# Patient Record
Sex: Male | Born: 1980 | Race: White | Hispanic: No | Marital: Single | Smoking: Current some day smoker
Health system: Southern US, Community
[De-identification: ages and names within clinical notes are randomized; demographics above are authoritative.]

## PROBLEM LIST (undated history)

## (undated) DIAGNOSIS — G061 Intraspinal abscess and granuloma: Secondary | ICD-10-CM

## (undated) DIAGNOSIS — B1920 Unspecified viral hepatitis C without hepatic coma: Secondary | ICD-10-CM

## (undated) DIAGNOSIS — K7010 Alcoholic hepatitis without ascites: Secondary | ICD-10-CM

## (undated) DIAGNOSIS — L0390 Cellulitis, unspecified: Secondary | ICD-10-CM

## (undated) DIAGNOSIS — M4620 Osteomyelitis of vertebra, site unspecified: Secondary | ICD-10-CM

## (undated) DIAGNOSIS — K7030 Alcoholic cirrhosis of liver without ascites: Secondary | ICD-10-CM

## (undated) DIAGNOSIS — F1010 Alcohol abuse, uncomplicated: Secondary | ICD-10-CM

## (undated) DIAGNOSIS — K852 Alcohol induced acute pancreatitis without necrosis or infection: Secondary | ICD-10-CM

## (undated) DIAGNOSIS — D696 Thrombocytopenia, unspecified: Secondary | ICD-10-CM

## (undated) HISTORY — PX: HERNIA REPAIR: SHX51

---

## 2011-03-18 ENCOUNTER — Emergency Department (HOSPITAL_COMMUNITY)
Admission: EM | Admit: 2011-03-18 | Discharge: 2011-03-18 | Disposition: A | Discharge status: Home / Self Care | Payer: Self-pay | Attending: Emergency Medicine | Admitting: Emergency Medicine

## 2011-03-18 DIAGNOSIS — L259 Unspecified contact dermatitis, unspecified cause: Secondary | ICD-10-CM | POA: Insufficient documentation

## 2013-09-28 DIAGNOSIS — B1920 Unspecified viral hepatitis C without hepatic coma: Secondary | ICD-10-CM

## 2013-09-28 HISTORY — DX: Unspecified viral hepatitis C without hepatic coma: B19.20

## 2014-01-26 DIAGNOSIS — K7030 Alcoholic cirrhosis of liver without ascites: Secondary | ICD-10-CM

## 2014-01-26 HISTORY — DX: Alcoholic cirrhosis of liver without ascites: K70.30

## 2014-05-29 HISTORY — PX: ESOPHAGOGASTRODUODENOSCOPY: SHX1529

## 2015-03-29 DIAGNOSIS — K852 Alcohol induced acute pancreatitis without necrosis or infection: Secondary | ICD-10-CM

## 2015-03-29 DIAGNOSIS — K7010 Alcoholic hepatitis without ascites: Secondary | ICD-10-CM

## 2015-03-29 HISTORY — DX: Alcohol induced acute pancreatitis without necrosis or infection: K85.20

## 2015-03-29 HISTORY — DX: Alcoholic hepatitis without ascites: K70.10

## 2016-10-16 ENCOUNTER — Emergency Department (HOSPITAL_COMMUNITY)
Admission: EM | Admit: 2016-10-16 | Discharge: 2016-10-16 | Disposition: A | Discharge status: Home / Self Care | Payer: BLUE CROSS/BLUE SHIELD | Attending: Emergency Medicine | Admitting: Emergency Medicine

## 2016-10-16 ENCOUNTER — Encounter (HOSPITAL_COMMUNITY): Payer: Self-pay | Admitting: *Deleted

## 2016-10-16 DIAGNOSIS — F17200 Nicotine dependence, unspecified, uncomplicated: Secondary | ICD-10-CM | POA: Insufficient documentation

## 2016-10-16 DIAGNOSIS — M5416 Radiculopathy, lumbar region: Secondary | ICD-10-CM | POA: Diagnosis not present

## 2016-10-16 DIAGNOSIS — M545 Low back pain: Secondary | ICD-10-CM | POA: Diagnosis present

## 2016-10-16 HISTORY — DX: Unspecified viral hepatitis C without hepatic coma: B19.20

## 2016-10-16 LAB — URINALYSIS, ROUTINE W REFLEX MICROSCOPIC
Bacteria, UA: NONE SEEN
Glucose, UA: NEGATIVE mg/dL
Hgb urine dipstick: NEGATIVE
Ketones, ur: 5 mg/dL — AB
Leukocytes, UA: NEGATIVE
NITRITE: NEGATIVE
PH: 7 (ref 5.0–8.0)
Protein, ur: 100 mg/dL — AB
SPECIFIC GRAVITY, URINE: 1.028 (ref 1.005–1.030)

## 2016-10-16 LAB — CBG MONITORING, ED: GLUCOSE-CAPILLARY: 90 mg/dL (ref 65–99)

## 2016-10-16 MED ORDER — CYCLOBENZAPRINE HCL 10 MG PO TABS
10.0000 mg | ORAL_TABLET | Freq: Once | ORAL | Status: AC
  Administered 2016-10-16: 10 mg via ORAL
  Filled 2016-10-16: qty 1

## 2016-10-16 MED ORDER — NAPROXEN 500 MG PO TABS: 500.0000 mg | ORAL_TABLET | Freq: Two times a day (BID) | ORAL | 0 refills | Status: DC

## 2016-10-16 MED ORDER — SODIUM CHLORIDE 0.9 % IV BOLUS (SEPSIS)
1000.0000 mL | Freq: Once | INTRAVENOUS | Status: AC
  Administered 2016-10-16: 1000 mL via INTRAVENOUS

## 2016-10-16 MED ORDER — PREDNISONE 20 MG PO TABS: 40.0000 mg | ORAL_TABLET | Freq: Every day | ORAL | 0 refills | Status: DC

## 2016-10-16 MED ORDER — CYCLOBENZAPRINE HCL 10 MG PO TABS: 10.0000 mg | ORAL_TABLET | Freq: Two times a day (BID) | ORAL | 0 refills | Status: DC | PRN

## 2016-10-16 MED ORDER — KETOROLAC TROMETHAMINE 30 MG/ML IJ SOLN
30.0000 mg | Freq: Once | INTRAMUSCULAR | Status: AC
  Administered 2016-10-16: 30 mg via INTRAVENOUS
  Filled 2016-10-16: qty 1

## 2016-10-16 MED ORDER — ONDANSETRON HCL 4 MG/2ML IJ SOLN
4.0000 mg | Freq: Once | INTRAMUSCULAR | Status: AC
  Administered 2016-10-16: 4 mg via INTRAVENOUS
  Filled 2016-10-16: qty 2

## 2016-10-16 MED ORDER — DICLOFENAC SODIUM 1 % TD GEL: 4.0000 g | Freq: Four times a day (QID) | TRANSDERMAL | 1 refills | Status: DC

## 2016-10-16 MED ORDER — HYDROCODONE-ACETAMINOPHEN 5-325 MG PO TABS: 2.0000 | ORAL_TABLET | ORAL | 0 refills | Status: DC | PRN

## 2016-10-16 MED ORDER — HYDROMORPHONE HCL 1 MG/ML IJ SOLN
0.5000 mg | Freq: Once | INTRAMUSCULAR | Status: AC
Start: 2016-10-16 — End: 2016-10-16
  Administered 2016-10-16: 0.5 mg via INTRAVENOUS
  Filled 2016-10-16: qty 1

## 2016-10-16 NOTE — ED Triage Notes (Signed)
Pt developed Rt lower back pain, at times unable to move during the night, pain shoots down rt leg. Unable to sleep

## 2016-10-16 NOTE — ED Provider Notes (Signed)
[facility] DEPT Provider Note   CSN: [id number] Arrival date & time: 10/16/16  1018     History   Chief Complaint Chief Complaint  Patient presents with  . Back Pain    HPI [name] is a 36 y.o. male.  The history is provided by the patient and the spouse.  Back Pain   This is a new problem. The current episode started yesterday. The problem occurs constantly. The problem has been rapidly worsening. The pain is associated with no known injury. The pain is present in the lumbar spine. The quality of the pain is described as shooting, stabbing and cramping. The pain radiates to the right thigh. The pain is at a severity of 10/10. The pain is severe. The symptoms are aggravated by bending and twisting (walking). The pain is the same all the time. Associated symptoms include leg pain. Pertinent negatives include no chest pain, no fever, no numbness, no abdominal pain, no abdominal swelling, no bowel incontinence, no perianal numbness, no bladder incontinence, no dysuria, no paresthesias, no paresis and no weakness. He has tried NSAIDs for the symptoms. The treatment provided no relief.    Past Medical History:  Diagnosis Date  . Hepatitis C     There are no active problems to display for this patient.   Past Surgical History:  Procedure Laterality Date  . HERNIA REPAIR         Home Medications    Prior to Admission medications   Medication Sig Start Date End Date Taking? Authorizing Provider  promethazine (PHENERGAN) 25 MG tablet Take 25 mg by mouth every 6 (six) hours as needed for nausea or vomiting.   Yes Historical Provider, MD    Family History No family history on file.  Social History Social History  Substance Use Topics  . Smoking status: Current Some Day Smoker  . Smokeless tobacco: Never Used  . Alcohol use Yes     Allergies   Patient has no known allergies.   Review of Systems Review of Systems  Constitutional: Negative for fever.    Cardiovascular: Negative for chest pain.  Gastrointestinal: Negative for abdominal pain and bowel incontinence.  Genitourinary: Negative for bladder incontinence and dysuria.  Musculoskeletal: Positive for back pain.  Neurological: Negative for weakness, numbness and paresthesias.  All other systems reviewed and are negative.    Physical Exam Updated Vital Signs BP 132/84   Pulse 84   Temp 98.3 F (36.8 C) (Oral)   Resp 16   Ht 5\' 11"  (1.803 m)   Wt 230 lb (104.3 kg)   SpO2 100%   BMI 32.08 kg/m   Physical Exam  Constitutional: He is oriented to person, place, and time. He appears well-developed and well-nourished. He appears distressed.  Appears uncomfortable  HENT:  Head: Normocephalic and atraumatic.  Mouth/Throat: Oropharynx is clear and moist.  Eyes: Conjunctivae and EOM are normal. Pupils are equal, round, and reactive to light.  Neck: Normal range of motion. Neck supple.  Cardiovascular: Normal rate, regular rhythm and intact distal pulses.   No murmur heard. Pulmonary/Chest: Effort normal and breath sounds normal. No respiratory distress. He has no wheezes. He has no rales.  Abdominal: Soft. He exhibits no distension. There is no tenderness. There is no rebound and no guarding.  Musculoskeletal: Normal range of motion. He exhibits no edema.       Lumbar back: He exhibits tenderness, pain and spasm. He exhibits no bony tenderness.       Back:  Neurological: He is alert and oriented to person, place, and time. He has normal strength. No sensory deficit.  5/5 strength in right lower ext.  Sensation intact  Skin: Skin is warm. No rash noted. He is diaphoretic. No erythema.  Psychiatric: He has a normal mood and affect. His behavior is normal.  Nursing note and vitals reviewed.    ED Treatments / Results  Labs (all labs ordered are listed, but only abnormal results are displayed) Labs Reviewed  URINALYSIS, ROUTINE W REFLEX MICROSCOPIC - Abnormal; Notable for the  following:       Result Value   Color, Urine AMBER (*)    Bilirubin Urine SMALL (*)    Ketones, ur 5 (*)    Protein, ur 100 (*)    Squamous Epithelial / LPF 0-5 (*)    All other components within normal limits  CBG MONITORING, ED    EKG  EKG Interpretation None       Radiology No results found.  Procedures Procedures (including critical care time)  Medications Ordered in ED Medications  ketorolac (TORADOL) 30 MG/ML injection 30 mg (not administered)  ondansetron (ZOFRAN) injection 4 mg (not administered)  sodium chloride 0.9 % bolus 1,000 mL (not administered)  HYDROmorphone (DILAUDID) injection 0.5 mg (not administered)  cyclobenzaprine (FLEXERIL) tablet 10 mg (not administered)     Initial Impression / Assessment and Plan / ED Course  I have reviewed the triage vital signs and the nursing notes.  Pertinent labs & imaging results that were available during my care of the patient were reviewed by me and considered in my medical decision making (see chart for details).    Pt with gradual onset of back pain starting yesterday significantly worsening overnight and now doing down the right leg suggestive of radiculopathy.  No neurovascular compromise and no incontinence.  Pt has no infectious sx, hx of CA  or other red flags concerning for pathologic back pain.  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 Family is present and bowel just for this. He has no track marks on his upper extremities or legs.  Normal strength and reflexes on exam.  Denies trauma.  He does not have significant CVA tenderness and no abdominal tenderness concerning for dissection, kidney stone, appendicitis. Patient does have some severe spasm and pain anytime he tries to move. Will give pt pain control.  6:53 PM Patient feeling much better. UA without blood and low suspicion for kidney stone at this  time. Patient discharged home.  Final Clinical Impressions(s) / ED Diagnoses   Final diagnoses:  Lumbar radiculopathy, acute    New Prescriptions New Prescriptions   CYCLOBENZAPRINE (FLEXERIL) 10 MG TABLET    Take 1 tablet (10 mg total) by mouth 2 (two) times daily as needed for muscle spasms.   DICLOFENAC SODIUM (VOLTAREN) 1 % GEL    Apply 4 g topically 4 (four) times daily.   HYDROCODONE-ACETAMINOPHEN (NORCO/VICODIN) 5-325 MG TABLET    Take 2 tablets by mouth every 4 (four) hours as needed.   NAPROXEN (NAPROSYN) 500 MG TABLET    Take 1 tablet (500 mg total) by mouth 2 (two) times daily.   PREDNISONE (DELTASONE) 20 MG TABLET    Take 2 tablets (40 mg total) by mouth daily.     Blanchie Dessert, MD 10/16/16 [phone]

## 2016-10-26 ENCOUNTER — Ambulatory Visit (HOSPITAL_COMMUNITY)
Admission: RE | Admit: 2016-10-26 | Discharge: 2016-10-26 | Disposition: A | Discharge status: Home / Self Care | Payer: BLUE CROSS/BLUE SHIELD | Source: Ambulatory Visit | Attending: Cardiovascular Disease | Admitting: Cardiovascular Disease

## 2016-10-26 ENCOUNTER — Other Ambulatory Visit (HOSPITAL_COMMUNITY): Payer: Self-pay | Admitting: Physical Medicine and Rehabilitation

## 2016-10-26 DIAGNOSIS — R600 Localized edema: Secondary | ICD-10-CM

## 2016-10-26 DIAGNOSIS — M7120 Synovial cyst of popliteal space [Baker], unspecified knee: Secondary | ICD-10-CM | POA: Insufficient documentation

## 2016-10-29 ENCOUNTER — Encounter (HOSPITAL_COMMUNITY): Payer: Self-pay | Admitting: Anesthesiology

## 2016-10-29 ENCOUNTER — Inpatient Hospital Stay (HOSPITAL_COMMUNITY)
Admission: EM | Admit: 2016-10-29 | Discharge: 2016-11-16 | DRG: 853 | Disposition: A | Discharge status: Rehabilitation Facility | Payer: BLUE CROSS/BLUE SHIELD | Attending: Internal Medicine | Admitting: Internal Medicine

## 2016-10-29 ENCOUNTER — Encounter (HOSPITAL_COMMUNITY)
Admission: EM | Disposition: A | Discharge status: Rehabilitation Facility | Payer: Self-pay | Source: Home / Self Care | Attending: Internal Medicine

## 2016-10-29 ENCOUNTER — Inpatient Hospital Stay (HOSPITAL_COMMUNITY): Payer: BLUE CROSS/BLUE SHIELD

## 2016-10-29 ENCOUNTER — Encounter (HOSPITAL_COMMUNITY): Payer: Self-pay | Admitting: Emergency Medicine

## 2016-10-29 DIAGNOSIS — M4620 Osteomyelitis of vertebra, site unspecified: Secondary | ICD-10-CM

## 2016-10-29 DIAGNOSIS — M869 Osteomyelitis, unspecified: Secondary | ICD-10-CM | POA: Diagnosis not present

## 2016-10-29 DIAGNOSIS — B182 Chronic viral hepatitis C: Secondary | ICD-10-CM

## 2016-10-29 DIAGNOSIS — K759 Inflammatory liver disease, unspecified: Secondary | ICD-10-CM

## 2016-10-29 DIAGNOSIS — M79604 Pain in right leg: Secondary | ICD-10-CM | POA: Diagnosis present

## 2016-10-29 DIAGNOSIS — M4656 Other infective spondylopathies, lumbar region: Secondary | ICD-10-CM | POA: Diagnosis not present

## 2016-10-29 DIAGNOSIS — F17200 Nicotine dependence, unspecified, uncomplicated: Secondary | ICD-10-CM | POA: Diagnosis present

## 2016-10-29 DIAGNOSIS — D6959 Other secondary thrombocytopenia: Secondary | ICD-10-CM | POA: Diagnosis present

## 2016-10-29 DIAGNOSIS — Z452 Encounter for adjustment and management of vascular access device: Secondary | ICD-10-CM

## 2016-10-29 DIAGNOSIS — F10931 Alcohol use, unspecified with withdrawal delirium: Secondary | ICD-10-CM

## 2016-10-29 DIAGNOSIS — M79A21 Nontraumatic compartment syndrome of right lower extremity: Secondary | ICD-10-CM | POA: Diagnosis present

## 2016-10-29 DIAGNOSIS — M009 Pyogenic arthritis, unspecified: Secondary | ICD-10-CM

## 2016-10-29 DIAGNOSIS — K7030 Alcoholic cirrhosis of liver without ascites: Secondary | ICD-10-CM

## 2016-10-29 DIAGNOSIS — L02415 Cutaneous abscess of right lower limb: Secondary | ICD-10-CM | POA: Diagnosis present

## 2016-10-29 DIAGNOSIS — M4646 Discitis, unspecified, lumbar region: Secondary | ICD-10-CM | POA: Diagnosis present

## 2016-10-29 DIAGNOSIS — B9689 Other specified bacterial agents as the cause of diseases classified elsewhere: Secondary | ICD-10-CM | POA: Diagnosis not present

## 2016-10-29 DIAGNOSIS — L0390 Cellulitis, unspecified: Secondary | ICD-10-CM

## 2016-10-29 DIAGNOSIS — R748 Abnormal levels of other serum enzymes: Secondary | ICD-10-CM

## 2016-10-29 DIAGNOSIS — Z833 Family history of diabetes mellitus: Secondary | ICD-10-CM

## 2016-10-29 DIAGNOSIS — Z6837 Body mass index (BMI) 37.0-37.9, adult: Secondary | ICD-10-CM

## 2016-10-29 DIAGNOSIS — Z9911 Dependence on respirator [ventilator] status: Secondary | ICD-10-CM | POA: Diagnosis not present

## 2016-10-29 DIAGNOSIS — A419 Sepsis, unspecified organism: Secondary | ICD-10-CM | POA: Insufficient documentation

## 2016-10-29 DIAGNOSIS — E876 Hypokalemia: Secondary | ICD-10-CM | POA: Diagnosis present

## 2016-10-29 DIAGNOSIS — Z9889 Other specified postprocedural states: Secondary | ICD-10-CM | POA: Diagnosis not present

## 2016-10-29 DIAGNOSIS — Z8781 Personal history of (healed) traumatic fracture: Secondary | ICD-10-CM | POA: Diagnosis not present

## 2016-10-29 DIAGNOSIS — M4626 Osteomyelitis of vertebra, lumbar region: Secondary | ICD-10-CM | POA: Diagnosis present

## 2016-10-29 DIAGNOSIS — A498 Other bacterial infections of unspecified site: Secondary | ICD-10-CM | POA: Diagnosis not present

## 2016-10-29 DIAGNOSIS — T79A9XD Traumatic compartment syndrome of other sites, subsequent encounter: Secondary | ICD-10-CM | POA: Diagnosis not present

## 2016-10-29 DIAGNOSIS — J9601 Acute respiratory failure with hypoxia: Secondary | ICD-10-CM | POA: Diagnosis present

## 2016-10-29 DIAGNOSIS — G062 Extradural and subdural abscess, unspecified: Secondary | ICD-10-CM | POA: Diagnosis not present

## 2016-10-29 DIAGNOSIS — E871 Hypo-osmolality and hyponatremia: Secondary | ICD-10-CM | POA: Diagnosis not present

## 2016-10-29 DIAGNOSIS — F10231 Alcohol dependence with withdrawal delirium: Secondary | ICD-10-CM | POA: Diagnosis present

## 2016-10-29 DIAGNOSIS — K7460 Unspecified cirrhosis of liver: Secondary | ICD-10-CM

## 2016-10-29 DIAGNOSIS — K7010 Alcoholic hepatitis without ascites: Secondary | ICD-10-CM | POA: Diagnosis present

## 2016-10-29 DIAGNOSIS — M01X9 Direct infection of multiple joints in infectious and parasitic diseases classified elsewhere: Secondary | ICD-10-CM | POA: Diagnosis not present

## 2016-10-29 DIAGNOSIS — D696 Thrombocytopenia, unspecified: Secondary | ICD-10-CM | POA: Diagnosis not present

## 2016-10-29 DIAGNOSIS — M60051 Infective myositis, right thigh: Secondary | ICD-10-CM

## 2016-10-29 DIAGNOSIS — B954 Other streptococcus as the cause of diseases classified elsewhere: Secondary | ICD-10-CM | POA: Diagnosis not present

## 2016-10-29 DIAGNOSIS — R7401 Elevation of levels of liver transaminase levels: Secondary | ICD-10-CM

## 2016-10-29 DIAGNOSIS — K7290 Hepatic failure, unspecified without coma: Secondary | ICD-10-CM | POA: Diagnosis present

## 2016-10-29 DIAGNOSIS — F1010 Alcohol abuse, uncomplicated: Secondary | ICD-10-CM

## 2016-10-29 DIAGNOSIS — E870 Hyperosmolality and hypernatremia: Secondary | ICD-10-CM

## 2016-10-29 DIAGNOSIS — K7040 Alcoholic hepatic failure without coma: Secondary | ICD-10-CM

## 2016-10-29 DIAGNOSIS — G061 Intraspinal abscess and granuloma: Secondary | ICD-10-CM

## 2016-10-29 DIAGNOSIS — Z7189 Other specified counseling: Secondary | ICD-10-CM | POA: Diagnosis not present

## 2016-10-29 DIAGNOSIS — L03115 Cellulitis of right lower limb: Secondary | ICD-10-CM | POA: Diagnosis present

## 2016-10-29 DIAGNOSIS — F10239 Alcohol dependence with withdrawal, unspecified: Secondary | ICD-10-CM

## 2016-10-29 DIAGNOSIS — R740 Nonspecific elevation of levels of transaminase and lactic acid dehydrogenase [LDH]: Secondary | ICD-10-CM | POA: Diagnosis present

## 2016-10-29 DIAGNOSIS — T79A9XA Traumatic compartment syndrome of other sites, initial encounter: Secondary | ICD-10-CM | POA: Diagnosis not present

## 2016-10-29 DIAGNOSIS — F10939 Alcohol use, unspecified with withdrawal, unspecified: Secondary | ICD-10-CM

## 2016-10-29 DIAGNOSIS — F1910 Other psychoactive substance abuse, uncomplicated: Secondary | ICD-10-CM | POA: Diagnosis present

## 2016-10-29 DIAGNOSIS — F1020 Alcohol dependence, uncomplicated: Secondary | ICD-10-CM | POA: Diagnosis present

## 2016-10-29 DIAGNOSIS — Z4659 Encounter for fitting and adjustment of other gastrointestinal appliance and device: Secondary | ICD-10-CM

## 2016-10-29 DIAGNOSIS — R509 Fever, unspecified: Secondary | ICD-10-CM

## 2016-10-29 DIAGNOSIS — K760 Fatty (change of) liver, not elsewhere classified: Secondary | ICD-10-CM | POA: Diagnosis present

## 2016-10-29 DIAGNOSIS — Z781 Physical restraint status: Secondary | ICD-10-CM

## 2016-10-29 DIAGNOSIS — K7200 Acute and subacute hepatic failure without coma: Secondary | ICD-10-CM | POA: Diagnosis not present

## 2016-10-29 DIAGNOSIS — M60009 Infective myositis, unspecified site: Secondary | ICD-10-CM | POA: Diagnosis present

## 2016-10-29 DIAGNOSIS — M7120 Synovial cyst of popliteal space [Baker], unspecified knee: Secondary | ICD-10-CM | POA: Diagnosis present

## 2016-10-29 DIAGNOSIS — G9340 Encephalopathy, unspecified: Secondary | ICD-10-CM | POA: Diagnosis not present

## 2016-10-29 DIAGNOSIS — M7989 Other specified soft tissue disorders: Secondary | ICD-10-CM | POA: Diagnosis not present

## 2016-10-29 DIAGNOSIS — Z09 Encounter for follow-up examination after completed treatment for conditions other than malignant neoplasm: Secondary | ICD-10-CM

## 2016-10-29 DIAGNOSIS — R162 Hepatomegaly with splenomegaly, not elsewhere classified: Secondary | ICD-10-CM | POA: Diagnosis present

## 2016-10-29 DIAGNOSIS — G8311 Monoplegia of lower limb affecting right dominant side: Secondary | ICD-10-CM | POA: Diagnosis not present

## 2016-10-29 DIAGNOSIS — T79A29A Traumatic compartment syndrome of unspecified lower extremity, initial encounter: Secondary | ICD-10-CM | POA: Diagnosis not present

## 2016-10-29 DIAGNOSIS — D649 Anemia, unspecified: Secondary | ICD-10-CM | POA: Diagnosis present

## 2016-10-29 DIAGNOSIS — E162 Hypoglycemia, unspecified: Secondary | ICD-10-CM | POA: Diagnosis present

## 2016-10-29 DIAGNOSIS — G8918 Other acute postprocedural pain: Secondary | ICD-10-CM

## 2016-10-29 DIAGNOSIS — Z515 Encounter for palliative care: Secondary | ICD-10-CM | POA: Diagnosis not present

## 2016-10-29 DIAGNOSIS — Z978 Presence of other specified devices: Secondary | ICD-10-CM

## 2016-10-29 DIAGNOSIS — E669 Obesity, unspecified: Secondary | ICD-10-CM | POA: Diagnosis present

## 2016-10-29 DIAGNOSIS — R5381 Other malaise: Secondary | ICD-10-CM | POA: Diagnosis not present

## 2016-10-29 DIAGNOSIS — D689 Coagulation defect, unspecified: Secondary | ICD-10-CM

## 2016-10-29 DIAGNOSIS — B999 Unspecified infectious disease: Secondary | ICD-10-CM | POA: Diagnosis present

## 2016-10-29 DIAGNOSIS — R269 Unspecified abnormalities of gait and mobility: Secondary | ICD-10-CM

## 2016-10-29 DIAGNOSIS — R451 Restlessness and agitation: Secondary | ICD-10-CM | POA: Diagnosis not present

## 2016-10-29 DIAGNOSIS — M7121 Synovial cyst of popliteal space [Baker], right knee: Secondary | ICD-10-CM | POA: Diagnosis not present

## 2016-10-29 DIAGNOSIS — I339 Acute and subacute endocarditis, unspecified: Secondary | ICD-10-CM | POA: Diagnosis not present

## 2016-10-29 DIAGNOSIS — Z01818 Encounter for other preprocedural examination: Secondary | ICD-10-CM

## 2016-10-29 HISTORY — DX: Intraspinal abscess and granuloma: G06.1

## 2016-10-29 HISTORY — DX: Cellulitis, unspecified: L03.90

## 2016-10-29 LAB — BASIC METABOLIC PANEL
ANION GAP: 11 (ref 5–15)
BUN: 17 mg/dL (ref 6–20)
CALCIUM: 7.7 mg/dL — AB (ref 8.9–10.3)
CO2: 21 mmol/L — AB (ref 22–32)
Chloride: 100 mmol/L — ABNORMAL LOW (ref 101–111)
Creatinine, Ser: 0.67 mg/dL (ref 0.61–1.24)
GFR calc non Af Amer: >60 mL/min (ref >60)
GLUCOSE: 168 mg/dL — AB (ref 65–99)
POTASSIUM: 3.7 mmol/L (ref 3.5–5.1)
Sodium: 132 mmol/L — ABNORMAL LOW (ref 135–145)

## 2016-10-29 LAB — CBC WITH DIFFERENTIAL/PLATELET
BASOS ABS: 0.2 10*3/uL — AB (ref 0.0–0.1)
Basophils Relative: 1 %
EOS PCT: 0 %
Eosinophils Absolute: 0 10*3/uL (ref 0.0–0.7)
HEMATOCRIT: 32.2 % — AB (ref 39.0–52.0)
HEMOGLOBIN: 11.4 g/dL — AB (ref 13.0–17.0)
LYMPHS PCT: 10 %
Lymphs Abs: 1.9 10*3/uL (ref 0.7–4.0)
MCH: 33.3 pg (ref 26.0–34.0)
MCHC: 35.4 g/dL (ref 30.0–36.0)
MCV: 94.2 fL (ref 78.0–100.0)
MONOS PCT: 11 %
Monocytes Absolute: 2.1 10*3/uL — ABNORMAL HIGH (ref 0.1–1.0)
NEUTROS PCT: 78 %
Neutro Abs: 15.1 10*3/uL — ABNORMAL HIGH (ref 1.7–7.7)
Platelets: 34 10*3/uL — ABNORMAL LOW (ref 150–400)
RBC: 3.42 MIL/uL — AB (ref 4.22–5.81)
RDW: 16.4 % — AB (ref 11.5–15.5)
WBC: 19.3 10*3/uL — AB (ref 4.0–10.5)

## 2016-10-29 LAB — C-REACTIVE PROTEIN: CRP: 17.3 mg/dL — AB (ref <1.0)

## 2016-10-29 LAB — SEDIMENTATION RATE: Sed Rate: 70 mm/hr — ABNORMAL HIGH (ref 0–16)

## 2016-10-29 LAB — HEPATIC FUNCTION PANEL
ALT: 64 U/L — AB (ref 17–63)
AST: 190 U/L — AB (ref 15–41)
Albumin: 2.1 g/dL — ABNORMAL LOW (ref 3.5–5.0)
Alkaline Phosphatase: 222 U/L — ABNORMAL HIGH (ref 38–126)
BILIRUBIN DIRECT: 8.7 mg/dL — AB (ref 0.1–0.5)
BILIRUBIN INDIRECT: 5.3 mg/dL — AB (ref 0.3–0.9)
TOTAL PROTEIN: 6.3 g/dL — AB (ref 6.5–8.1)
Total Bilirubin: 14 mg/dL — ABNORMAL HIGH (ref 0.3–1.2)

## 2016-10-29 LAB — I-STAT CG4 LACTIC ACID, ED: LACTIC ACID, VENOUS: 1.98 mmol/L — AB (ref 0.5–1.9)

## 2016-10-29 LAB — APTT: aPTT: 38 seconds — ABNORMAL HIGH (ref 24–36)

## 2016-10-29 LAB — LACTIC ACID, PLASMA: Lactic Acid, Venous: 1.6 mmol/L (ref 0.5–1.9)

## 2016-10-29 LAB — PROTIME-INR
INR: 1.96
PROTHROMBIN TIME: 22.6 s — AB (ref 11.4–15.2)

## 2016-10-29 SURGERY — IRRIGATION AND DEBRIDEMENT WOUND
Anesthesia: General

## 2016-10-29 MED ORDER — POLYETHYLENE GLYCOL 3350 17 G PO PACK
17.0000 g | PACK | Freq: Every day | ORAL | Status: DC | PRN
Start: 2016-10-29 — End: 2016-10-31
  Filled 2016-10-29: qty 1

## 2016-10-29 MED ORDER — HYDROMORPHONE HCL 1 MG/ML IJ SOLN
1.0000 mg | Freq: Once | INTRAMUSCULAR | Status: AC
  Administered 2016-10-29: 1 mg via INTRAVENOUS
  Filled 2016-10-29: qty 1

## 2016-10-29 MED ORDER — VANCOMYCIN HCL IN DEXTROSE 1-5 GM/200ML-% IV SOLN
1000.0000 mg | Freq: Three times a day (TID) | INTRAVENOUS | Status: DC
  Administered 2016-10-29 – 2016-11-01 (×8): 1000 mg via INTRAVENOUS
  Filled 2016-10-29 (×10): qty 200

## 2016-10-29 MED ORDER — BISACODYL 5 MG PO TBEC: 5.0000 mg | DELAYED_RELEASE_TABLET | Freq: Every day | ORAL | Status: DC | PRN

## 2016-10-29 MED ORDER — MORPHINE SULFATE (PF) 2 MG/ML IV SOLN
0.5000 mg | INTRAVENOUS | Status: DC | PRN
  Administered 2016-10-29 – 2016-10-31 (×7): 0.5 mg via INTRAVENOUS
  Filled 2016-10-29 (×7): qty 1

## 2016-10-29 MED ORDER — ONDANSETRON HCL 4 MG/2ML IJ SOLN
4.0000 mg | Freq: Four times a day (QID) | INTRAMUSCULAR | Status: DC | PRN
  Administered 2016-10-30 (×2): 4 mg via INTRAVENOUS
  Filled 2016-10-29 (×2): qty 2

## 2016-10-29 MED ORDER — THIAMINE HCL 100 MG/ML IJ SOLN
100.0000 mg | Freq: Every day | INTRAMUSCULAR | Status: DC
  Administered 2016-10-29: 100 mg via INTRAVENOUS
  Filled 2016-10-29: qty 2

## 2016-10-29 MED ORDER — ONDANSETRON HCL 4 MG PO TABS
4.0000 mg | ORAL_TABLET | Freq: Four times a day (QID) | ORAL | Status: DC | PRN
Start: 2016-10-29 — End: 2016-10-31
  Filled 2016-10-29: qty 1

## 2016-10-29 MED ORDER — LACTULOSE 10 GM/15ML PO SOLN
30.0000 g | Freq: Two times a day (BID) | ORAL | Status: DC
  Administered 2016-10-29: 30 g via ORAL
  Filled 2016-10-29 (×2): qty 45

## 2016-10-29 MED ORDER — DEXTROSE-NACL 5-0.9 % IV SOLN: INTRAVENOUS | Status: DC

## 2016-10-29 MED ORDER — VANCOMYCIN HCL IN DEXTROSE 1-5 GM/200ML-% IV SOLN
1000.0000 mg | Freq: Once | INTRAVENOUS | Status: AC
  Administered 2016-10-29: 1000 mg via INTRAVENOUS
  Filled 2016-10-29: qty 200

## 2016-10-29 MED ORDER — PIPERACILLIN-TAZOBACTAM 3.375 G IVPB 30 MIN
3.3750 g | Freq: Once | INTRAVENOUS | Status: AC
  Administered 2016-10-29: 3.375 g via INTRAVENOUS
  Filled 2016-10-29: qty 50

## 2016-10-29 MED ORDER — LORAZEPAM 2 MG/ML IJ SOLN
1.0000 mg | Freq: Four times a day (QID) | INTRAMUSCULAR | Status: DC | PRN
  Administered 2016-10-30: 1 mg via INTRAVENOUS
  Filled 2016-10-29: qty 1

## 2016-10-29 MED ORDER — SODIUM CHLORIDE 0.9 % IV SOLN
1.0000 mg | Freq: Once | INTRAVENOUS | Status: AC
  Administered 2016-10-29: 1 mg via INTRAVENOUS
  Filled 2016-10-29: qty 0.2

## 2016-10-29 MED ORDER — PIPERACILLIN-TAZOBACTAM 3.375 G IVPB: 3.3750 g | Freq: Three times a day (TID) | INTRAVENOUS | Status: DC

## 2016-10-29 MED ORDER — SODIUM CHLORIDE 0.9 % IV SOLN
INTRAVENOUS | Status: DC
  Administered 2016-10-29 – 2016-11-01 (×3): via INTRAVENOUS

## 2016-10-29 MED ORDER — SODIUM CHLORIDE 0.9 % IV BOLUS (SEPSIS)
1000.0000 mL | Freq: Once | INTRAVENOUS | Status: AC
  Administered 2016-10-29: 1000 mL via INTRAVENOUS

## 2016-10-29 MED ORDER — MORPHINE SULFATE (PF) 4 MG/ML IV SOLN
4.0000 mg | Freq: Once | INTRAVENOUS | Status: AC
  Administered 2016-10-29: 4 mg via INTRAVENOUS
  Filled 2016-10-29: qty 1

## 2016-10-29 MED ORDER — BISACODYL 10 MG RE SUPP: 10.0000 mg | Freq: Every day | RECTAL | Status: DC | PRN

## 2016-10-29 NOTE — ED Notes (Signed)
Report given to [facility] for transport.  

## 2016-10-29 NOTE — Progress Notes (Addendum)
Pharmacy Antibiotic Note  [name] is a 36 y.o. male admitted on 10/29/2016 with epidural abscess and cellulitis.  Pharmacy has been consulted for vancomycin and zosyn dosing. Consult comment says cellulitis and epidural abscess.  Will dose for trough 15-20 for epidural abscess.   Today, 10/29/2016  Renal: SCr WNL  WBC elevated  afebrile  Plan:  Vancomycin 1gm x 1 then 1gm IV q8h   Zosyn 3.375gm IV q8h over 4h infusion   Follow renal function, checking SCr at least q72h while on vancomycin   Check trough if remains on vancomycin > 48hrs  If spinal epidural abscess present, suggest change zosyn to cefepime or ceftriaxone (if no pseudomonas risk).     Temp (24hrs), Avg:97.8 F (36.6 C), Min:97.8 F (36.6 C), Max:97.8 F (36.6 C)  No results for input(s): WBC, CREATININE, LATICACIDVEN, VANCOTROUGH, VANCOPEAK, VANCORANDOM, GENTTROUGH, GENTPEAK, GENTRANDOM, TOBRATROUGH, TOBRAPEAK, TOBRARND, AMIKACINPEAK, AMIKACINTROU, AMIKACIN in the last 168 hours.  CrCl cannot be calculated (No order found.).    No Known Allergies  Antimicrobials this admission: 2/1 >> vancomycin 2/1 >> zosyn   Dose adjustments this admission:  Microbiology results: 2/1 BCx:   Thank you for allowing pharmacy to be a part of this patient's care.  Doreene Eland, PharmD, BCPS.   Pager: [phone] 10/29/2016 12:38 PM

## 2016-10-29 NOTE — H&P (Addendum)
History and Physical    Dash Cardarelli BHA:[id number] DOB: Jul 13, 1981 DOA: 10/29/2016  PCP: Nicholes Rough, PA-C  Patient coming from: Home   Chief Complaint: Back pain, right leg pain.   HPI: [name] is a 36 y.o. male with medical history significant of hepatitis C, cirrhosis per wife, last records LFT AST 74, AST 163, Alkaline phosphatase 119, Bili 1.2, still drinks occasional alcohol, not significant amount per wife, he presents complaining of worsening back pain and right lower extremity edema and redness. He report having back pain since January 19. He was seeing in the ED and discharge on pain regimen. He went to see Dr Nelva Bush with ortho and underwent MRI which showed L 4 -5 right facet joint septic arthritis, and soft tissue abscess within the adjacent posterior Paraspinous musculature 1.7* 1.3* 4.6.  He report pain as sharp, shooting pain, radiates to right LE. Is difficult for him to walk due to pain and swelling right LE.  He denies chest pain or dyspnea. No BM in 3 days. His wife just notice that he became jaundice since yesterday. Also wife report some confusion early today.   ED Course: Patient was found to have bilirubin at 14, AST 64, ALT 190, Alk phosphatase 222, WBC at 19, Platelet at 34, ESR 70, lactic acid 1.9   Review of Systems: As per HPI otherwise 10 point review of systems negative.    Past Medical History:  Diagnosis Date  . Hepatitis C     Past Surgical History:  Procedure Laterality Date  . HERNIA REPAIR       reports that he has been smoking.  He has never used smokeless tobacco. He reports that he drinks alcohol. He reports that he does not use drugs.  No Known Allergies Family History; mother has DM, father died MI, brother died MI.    Prior to Admission medications   Medication Sig Start Date End Date Taking? Authorizing Provider  cyclobenzaprine (FLEXERIL) 10 MG tablet Take 1 tablet (10 mg total) by mouth 2 (two) times daily as needed for  muscle spasms. 10/16/16  Yes Blanchie Dessert, MD  HYDROcodone-acetaminophen (NORCO/VICODIN) 5-325 MG tablet Take 2 tablets by mouth every 4 (four) hours as needed. 10/16/16  Yes Blanchie Dessert, MD  naproxen (NAPROSYN) 500 MG tablet Take 1 tablet (500 mg total) by mouth 2 (two) times daily. 10/16/16  Yes Blanchie Dessert, MD  promethazine (PHENERGAN) 25 MG tablet Take 25 mg by mouth every 6 (six) hours as needed for nausea or vomiting.   Yes Historical Provider, MD  diclofenac sodium (VOLTAREN) 1 % GEL Apply 4 g topically 4 (four) times daily. Patient not taking: Reported on 10/29/2016 10/16/16   Blanchie Dessert, MD  predniSONE (DELTASONE) 20 MG tablet Take 2 tablets (40 mg total) by mouth daily. Patient not taking: Reported on 10/29/2016 10/16/16   Blanchie Dessert, MD    Physical Exam: Vitals:   10/29/16 1430 10/29/16 1500 10/29/16 1600 10/29/16 1622  BP: 130/81 131/76 134/65 134/65  Pulse: 112 117 111 116  Resp:  18  18  Temp:      SpO2: 95% 96% 93% 98%      Constitutional: NAD, calm, comfortable, chronic blepharospasm. Jaundice.  Vitals:   10/29/16 1430 10/29/16 1500 10/29/16 1600 10/29/16 1622  BP: 130/81 131/76 134/65 134/65  Pulse: 112 117 111 116  Resp:  18  18  Temp:      SpO2: 95% 96% 93% 98%   Eyes: PERRL, lids and conjunctivae jaundice.  ENMT: Mucous membranes are moist. Posterior pharynx clear of any exudate or lesions.Normal dentition.  Neck: normal, supple, no masses, no thyromegaly Respiratory: clear to auscultation bilaterally, no wheezing, no crackles. Normal respiratory effort. No accessory muscle use.  Cardiovascular: Regular rate and rhythm, no murmurs / rubs / gallops. No extremity edema. 2+ pedal pulses. No carotid bruits.  Abdomen: no tenderness, no masses palpated. No hepatosplenomegaly. Bowel sounds positive.  Musculoskeletal: no clubbing / cyanosis. No joint deformity upper and lower extremities. Good ROM, no contractures. Normal muscle tone.  Skin: Right  lower extremity with edema and redness.  Neurologic: CN 2-12 grossly intact. Sensation intact, DTR normal. Strength 5/5 in all 4.  Psychiatric: Normal judgment and insight. Alert and oriented x 3. Normal mood.     Labs on Admission: I have personally reviewed following labs and imaging studies  CBC:  Recent Labs Lab 10/29/16 1214  WBC 19.3*  NEUTROABS 15.1*  HGB 11.4*  HCT 32.2*  MCV 94.2  PLT 34*   Basic Metabolic Panel:  Recent Labs Lab 10/29/16 1214  NA 132*  K 3.7  CL 100*  CO2 21*  GLUCOSE 168*  BUN 17  CREATININE 0.67  CALCIUM 7.7*   GFR: Estimated Creatinine Clearance: 158.4 mL/min (by C-G formula based on SCr of 0.67 mg/dL). Liver Function Tests:  Recent Labs Lab 10/29/16 1214  AST 190*  ALT 64*  ALKPHOS 222*  BILITOT 14.0*  PROT 6.3*  ALBUMIN 2.1*   No results for input(s): LIPASE, AMYLASE in the last 168 hours. No results for input(s): AMMONIA in the last 168 hours. Coagulation Profile:  Recent Labs Lab 10/29/16 1630  INR 1.96   Cardiac Enzymes: No results for input(s): CKTOTAL, CKMB, CKMBINDEX, TROPONINI in the last 168 hours. BNP (last 3 results) No results for input(s): PROBNP in the last 8760 hours. HbA1C: No results for input(s): HGBA1C in the last 72 hours. CBG: No results for input(s): GLUCAP in the last 168 hours. Lipid Profile: No results for input(s): CHOL, HDL, LDLCALC, TRIG, CHOLHDL, LDLDIRECT in the last 72 hours. Thyroid Function Tests: No results for input(s): TSH, T4TOTAL, FREET4, T3FREE, THYROIDAB in the last 72 hours. Anemia Panel: No results for input(s): VITAMINB12, FOLATE, FERRITIN, TIBC, IRON, RETICCTPCT in the last 72 hours. Urine analysis:    Component Value Date/Time   COLORURINE AMBER (A) 10/16/2016 1524   APPEARANCEUR CLEAR 10/16/2016 1524   LABSPEC 1.028 10/16/2016 1524   PHURINE 7.0 10/16/2016 1524   GLUCOSEU NEGATIVE 10/16/2016 1524   HGBUR NEGATIVE 10/16/2016 1524   BILIRUBINUR SMALL (A)  10/16/2016 1524   KETONESUR 5 (A) 10/16/2016 1524   PROTEINUR 100 (A) 10/16/2016 1524   NITRITE NEGATIVE 10/16/2016 1524   LEUKOCYTESUR NEGATIVE 10/16/2016 1524   Sepsis Labs: !!!!!!!!!!!!!!!!!!!!!!!!!!!!!!!!!!!!!!!!!!!! '@LABRCNTIP' (procalcitonin:4,lacticidven:4) )No results found for this or any previous visit (from the past 240 hour(s)).   Radiological Exams on Admission: Dg Chest Port 1 View  Result Date: 10/29/2016 CLINICAL DATA:  Preop evaluation prior treatment for epidural abscess and cellulitis. No current chest complaints. Current smoker. EXAM: PORTABLE CHEST 1 VIEW COMPARISON:  None in PACs FINDINGS: Today's radiograph is taken in a lordotic projection. The lungs are well-expanded. The interstitial markings are coarse. There is no alveolar infiltrate or pleural effusion. The heart is top-normal in size. The pulmonary vascularity is not clearly engorged. The bony thorax exhibits no acute abnormality. IMPRESSION: Bilateral interstitial prominence likely reflects the patient's smoking history. No definite pneumonia nor CHF. Electronically Signed   By: David  Martinique M.D.  On: 10/29/2016 17:05    EKG: Independently reviewed. Sinus tachycardia.   Assessment/Plan Active Problems:   Epidural abscess   Liver failure (HCC)   Hyponatremia  1-Epidural Abscess, facet joint septic joint L 4-5;  Admit to [facility] hospital  Step down unit.  Dr Rolena Infante evaluated patient, we agreed  patient is very high risk at this time for anesthesia without further labs results and [place] available for liver function. Also patient is neurological intact at this time  Continue with IV antibiotics.  Will need formal ID consult in am.  Also GI was consulted.  Follow Blood culture.   2-Hepatitis, C, History of cirrhosis ?,  Presents with decompensation of liver function. Elevation of bili at 14.  Will need RUQ [place] to further evaluate.  Viral load Hepatitis c ordered.  Start lactulose.  GI consulted.    3-Hyponatremia; Started on IV fluids.   4-Thrombocytopenia; in setting liver failure. Will required platelet transfusion if he undergo sx.    DVT prophylaxis: SCD.  Code Status: presume full code.  Family Communication: wife, mother at bedside.  Disposition Plan: unable to determine time of discharge.  Consults called: Ortho, GI, PA spoke with Azucena Freed with [facility]  Admission status: Step down.    Elmarie Shiley MD [facility] Pager [phone]  If 7PM-7AM, please contact night-coverage [email or web address] Password [email or web address]  10/29/2016, 5:21 PM

## 2016-10-29 NOTE — ED Notes (Signed)
Notified [facility] for transport.

## 2016-10-29 NOTE — Consult Note (Signed)
                                                                           [facility] Consult: 8:47 AM 10/30/2016  LOS: 1 day    Referring Provider: Dr Regalado and ED PA Jamie Pilcher Primary Care Physician:  BEAL, SHERI, PA-C Primary Gastroenterologist:  Unassigned.  Dr Toledo in [place]    Reason for Consultation:  Decompensated liver.     HPI: [name] is a 35 y.o. male.  Hx Hep C.  Alcoholic hepatitis treated with Prednisolone during admission in 03/2015 with severe encephalopathy requiring intubation.  Reportedly completed 12 weeks of some unspecified antiretroviral treatment at the end of 2015 but this did not eradicate the virus. He was told that on imaging and lab work his liver looked, "much better on ultrasound."    At GI follow up in 04/2015 there was talk of treating with Harvoni.  He never followed through with Dr. Toledo. With the [facility] because when he went to get lab work at [facility], they refused him service as he had them "$500".  Hepatomegaly and fatty liver on CT 11/2013. 04/2015 HIDA normal.  12/2015 ultrasound showed hepatosplenomegaly. Diffuse fatty infiltration of the liver.  S/p renal hernia repair. Admits to ETOH "occasionally", "not every day" but will drink anywhere from a pint to 750 ml o vodka on days when he drinks.     Seen in ED 10/16/16 with 24 hours of lower back pain radiating to right leg.  NSAIDs had not helped.  Did not undergo spine imaging, discharged on flexeril, naprosyn, prednisone, vicodin.  Seen by pain mgt last week for back pain, exclusively right LE edema and redness.  From there sent to a cardiologist.  Doppler confirmed Baker's cyst.  Revisit to ortho, Dr Ramos, 10/29/16.  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 L2-3 and L3-4 disc extrusions also noted  Seen in [facility] ED for admission and  mgt.  Labs show platelets o 34 K.  WBCs 19.3.  Na 132.  T bili 11.4. Alk phos 222, AST/ALT 190/64.  BUN normal.  Glucose 168.   Coags 23/2.0/43 PMD office says T bili was 1.2 in 11/2015 10/29/16 ultrasound: Diffuse hepatic parenchymal echogenicity suggesting steatosis or other hepatocellular disease.  Gallbladder mural thickening without calculi or Murphy's sign. This can be seen with hepatocellular disease or portal hypertension.  No bile duct dilatation.  No ascites in the right upper quadrant.  Pt denies dark amber like urine, did not notice jaundice until this past weekend, so 6 to 7 days ago.  No easy bruising or excessive bleeding. Had been using some ibuprofen prior to last week. Has not been using acetaminophen other than what is contained in the Vicodin.   Family history of liver disease or GI cancers. 15-20 years ago. Admits to snorting and injecting drugs but has been abstaining from this sort of behavior since then.      Past Medical History:  Diagnosis Date  . Hepatitis C     Past Surgical History:  Procedure Laterality Date  . HERNIA REPAIR      Prior to Admission medications     Medication Sig Start Date End Date Taking? Authorizing Provider  cyclobenzaprine (FLEXERIL) 10 MG tablet Take 1 tablet (10 mg total) by mouth 2 (two) times daily as needed for muscle spasms. 10/16/16  Yes Blanchie Dessert, MD  HYDROcodone-acetaminophen (NORCO/VICODIN) 5-325 MG tablet Take 2 tablets by mouth every 4 (four) hours as needed. 10/16/16  Yes Blanchie Dessert, MD  naproxen (NAPROSYN) 500 MG tablet Take 1 tablet (500 mg total) by mouth 2 (two) times daily. 10/16/16  Yes Blanchie Dessert, MD  promethazine (PHENERGAN) 25 MG tablet Take 25 mg by mouth every 6 (six) hours as needed for nausea or vomiting.   Yes Historical Provider, MD  diclofenac sodium (VOLTAREN) 1 % GEL Apply 4 g topically 4 (four) times daily. Patient not taking: Reported on 10/29/2016 10/16/16   Blanchie Dessert, MD  predniSONE  (DELTASONE) 20 MG tablet Take 2 tablets (40 mg total) by mouth daily. Patient not taking: Reported on 10/29/2016 10/16/16   Blanchie Dessert, MD    Scheduled Meds: . lactulose  30 g Oral BID  . prednisoLONE  40 mg Oral Daily  . thiamine injection  100 mg Intravenous Daily  . vancomycin  1,000 mg Intravenous Q8H   Infusions: . sodium chloride 75 mL/hr at 10/30/16 0636   PRN Meds:    Allergies as of 10/29/2016  . (No Known Allergies)    History reviewed. No pertinent family history.  Social History   Social History  . Marital status: Single    Spouse name: N/A  . Number of children: N/A  . Years of education: N/A   Occupational History  . Not on file.   Social History Main Topics  . Smoking status: Current Some Day Smoker  . Smokeless tobacco: Never Used  . Alcohol use Yes  . Drug use: No  . Sexual activity: Not on file   Other Topics Concern  . Not on file   Social History Narrative  . No narrative on file    REVIEW OF SYSTEMS: Constitutional:  Up until recently he has been able to perform his tasks working for a [employer]. Profound fatigue, feels tired but not exhausted. ENT:  No nose bleeds Pulm:  No cough, no dyspnea. CV:  No palpitations.  Her chest pain. Right leg swelling. GU:  No hematuria, no frequency GI:  No melena or bloody stools. No anorexia, no emesis. Heme:  No unusual bleeding or bruising.   Transfusions:  None ever. Neuro:  Occasional headaches headaches. no peripheral tingling or numbness Derm:  No itching, no rash or sores.  Endocrine:  + sweats.  No polyuria or dysuria Immunization:  Has not received hepatitis A/B immunizations per his recall. Travel:  None beyond local counties in last few months.    PHYSICAL EXAM: Vital signs in last 24 hours: Vitals:   10/30/16 0351 10/30/16 0814  BP: (!) 151/73 139/88  Pulse: (!) 107 (!) 112  Resp: (!) 24   Temp: 98.4 F (36.9 C) 98.9 F (37.2 C)   Wt Readings from Last 3  Encounters:  10/30/16 119.4 kg (263 lb 4.8 oz)  10/16/16 104.3 kg (230 lb)    General: Pleasant, alert, tremulous, moderately ill appearing WF in Head:  No signs of head trauma.    Eyes:  Slight scleral icterus, no conjunctival pallor. Ears:  Not hard of hearing  Nose:  No discharge, sounds congested. Mouth:  Somewhat dry oral mucosa, no lesions. Tongue midline. Neck:  No JVD, no masses, no thyromegaly. Lungs:  Unlabored  breathing. Lungs clear bilaterally. Cough. Heart: Regular, tachycardia in the 1teens.  No MRG. S1, S2 audible. Abdomen:  Soft, slightly obese/protuberant, no obvious fluid wave/ascites. No tenderness. Liver spleen not palpable. No hernias. Active bowel sounds..   Rectal: Deferred   GU:  No scrotal edema Musc/Skeltl: No joint contractures or gross deformities Extremities:  Redness and swelling in the right lower extremity extends at least into the name.  Neurologic:  Alert. Oriented times 3. Good historian. Tremulous. No asterixis. Skin:  Possible telangiectasia on the face but none on the trunk. No sores or rashes. Nodes:  No cervical or inguinal adenopathy.   Psych:  Pleasant, cooperative.  Intake/Output from previous day: 02/01 0701 - 02/02 0700 In: 200 [IV Piggyback:200] Out: 400 [Urine:400] Intake/Output this shift: No intake/output data recorded.  LAB RESULTS:  Recent Labs  10/29/16 1214 10/30/16 0514  WBC 19.3* 15.5*  HGB 11.4* 10.8*  HCT 32.2* 31.7*  PLT 34* 51*   BMET Lab Results  Component Value Date   NA 134 (L) 10/30/2016   NA 132 (L) 10/29/2016   K 3.7 10/30/2016   K 3.7 10/29/2016   CL 103 10/30/2016   CL 100 (L) 10/29/2016   CO2 21 (L) 10/30/2016   CO2 21 (L) 10/29/2016   GLUCOSE 89 10/30/2016   GLUCOSE 168 (H) 10/29/2016   BUN 9 10/30/2016   BUN 17 10/29/2016   CREATININE 0.67 10/30/2016   CREATININE 0.67 10/29/2016   CALCIUM 7.4 (L) 10/30/2016   CALCIUM 7.7 (L) 10/29/2016   LFT  Recent Labs  10/29/16 1214  10/30/16 0514  PROT 6.3* 6.0*  ALBUMIN 2.1* 1.8*  AST 190* 227*  ALT 64* 69*  ALKPHOS 222* 189*  BILITOT 14.0* 14.1*  BILIDIR 8.7*  --   IBILI 5.3*  --    PT/INR Lab Results  Component Value Date   INR 2.04 10/30/2016   INR 1.96 10/29/2016    Drugs of Abuse  No results found for: LABOPIA, COCAINSCRNUR, LABBENZ, AMPHETMU, THCU, LABBARB   RADIOLOGY STUDIES: Dg Chest Port 1 View  Result Date: 10/29/2016 CLINICAL DATA:  Preop evaluation prior treatment for epidural abscess and cellulitis. No current chest complaints. Current smoker. EXAM: PORTABLE CHEST 1 VIEW COMPARISON:  None in PACs FINDINGS: Today's radiograph is taken in a lordotic projection. The lungs are well-expanded. The interstitial markings are coarse. There is no alveolar infiltrate or pleural effusion. The heart is top-normal in size. The pulmonary vascularity is not clearly engorged. The bony thorax exhibits no acute abnormality. IMPRESSION: Bilateral interstitial prominence likely reflects the patient's smoking history. No definite pneumonia nor CHF. Electronically Signed   By: David  Jordan M.D.   On: 10/29/2016 17:05   Us Abdomen Limited Ruq  Result Date: 10/29/2016 CLINICAL DATA:  Jaundice.  History of hepatitis-C. EXAM: US ABDOMEN LIMITED - RIGHT UPPER QUADRANT COMPARISON:  None. FINDINGS: Gallbladder: Moderate gallbladder mural thickening, measuring 6 mm. No calculi. No tenderness over the gallbladder. Common bile duct: Diameter: Normal, 4.5 mm. Liver: Diffusely increased echogenicity without focal lesion. IMPRESSION: 1. Diffuse hepatic parenchymal echogenicity suggesting steatosis or other hepatocellular disease. 2. Gallbladder mural thickening without calculi or Murphy's sign. This can be seen with hepatocellular disease or portal hypertension. 3. No bile duct dilatation. 4. No ascites in the right upper quadrant. Electronically Signed   By: Daniel R Mitchell M.D.   On: 10/29/2016 20:30     IMPRESSION:   *  Spinal  abscess, On antibiotics. Ortho Evra would like to proceed with surgery once   medically cleared and stable.  *  Jaundice.  Hx Hep C and fatty liver.  Hx severe ETOH hepatitis 2015. Never followed through with planned treatment for Harvoni after failing some on specified antiviral in 2015. Discriminant function is 66 c/w recurrent alcoholic hepatitis.  *  Alcohol abuse and likely dependence. Patient is tremulous, tachycardic and has all indications that he is getting to have withdrawal signs.  *  Coagulopathy.      PLAN:     *  Began prednisolone 40 mg daily. Allow patient to eat. Continue antibiotics.   Azucena Freed  10/30/2016, 8:47 AM Pager: [phone]    Attending physician's note   I have taken a history, examined the patient and reviewed the chart. I agree with the Advanced Practitioner's note, impression and recommendations. 36 yo male with Hep C, fatty liver and etoh abuse with a history of severe alcoholic hepatitis in 0539. Prior Hep C therapy was not successful and treatment Harvoni was being considered. Now with spinal abscesses requiring surgical mgmt.  Alcoholic hepatitis is the likely cause of his acute hepatic decompensation. MELD=24 and discriminant function=66. Monitor closely for etoh withdrawal. Alcoholic hepatitis typically takes 1-3 months to resolve. He is at increased risk for hepatic decompensation, morbidity and mortality due alcoholic hepatitis however this risk will not likely change for 1-2 months. Avoid alcohol and acetaminophen. Prednisolone 40 mg daily for 30 days is indicated for alcoholic hepatitis however with infection will defer decision to primary service.   Lucio Edward, MD [facility] [phone] Mon-Fri 8a-5p [phone] after 5p, weekends, holidays

## 2016-10-29 NOTE — ED Triage Notes (Signed)
Patient sent by Dr. Nelva Bush for potential cellulitis in right leg since Sunday. Swelling noted to right leg and foot. Reports he was seen at PCP and has had doppler, MRI, and MRI with contrast. Girlfriend reports patient "looks more jaundiced then normal." Patient denies N/V/D, abdominal pain, chest pain, and SOB.

## 2016-10-29 NOTE — ED Notes (Signed)
[place] tech at the bedside.

## 2016-10-29 NOTE — ED Notes (Signed)
RLE is warm to the touch.

## 2016-10-29 NOTE — ED Provider Notes (Signed)
[facility] DEPT Provider Note   CSN: [id number] Arrival date & time: 10/29/16  1113     History   Chief Complaint Chief Complaint  Patient presents with  . Leg Pain    HPI [name] is a 36 y.o. male.  The history is provided by the patient and medical records. No language interpreter was used.  Leg Pain      [name] is a 36 y.o. male  with a PMH of chronic hepatitis C who presents to the Emergency Department from orthopedics, Dr. Nelva Bush. Patient notes he has been experiencing progressively worsening low back for several weeks now. Four days ago, he noticed right leg swelling. He was seen by orthopedics on Monday who ordered a ultrasound which was negative for DVT, although did show Baker's cyst. ESR and CRP obtained in the clinic as well with ESR of 73 and CRP of 200. Outpatient MRI concerning for epidural abscess and right facet arthritis, therefore patient sent to ER for further workup. Pain worse with movement and palpation. No outpatient antibiotic treatment prior to arrival. Denies fever or chills, no chest pain or shortness of breath.    Past Medical History:  Diagnosis Date  . Hepatitis C     There are no active problems to display for this patient.   Past Surgical History:  Procedure Laterality Date  . HERNIA REPAIR         Home Medications    Prior to Admission medications   Medication Sig Start Date End Date Taking? Authorizing Provider  cyclobenzaprine (FLEXERIL) 10 MG tablet Take 1 tablet (10 mg total) by mouth 2 (two) times daily as needed for muscle spasms. 10/16/16  Yes Blanchie Dessert, MD  HYDROcodone-acetaminophen (NORCO/VICODIN) 5-325 MG tablet Take 2 tablets by mouth every 4 (four) hours as needed. 10/16/16  Yes Blanchie Dessert, MD  naproxen (NAPROSYN) 500 MG tablet Take 1 tablet (500 mg total) by mouth 2 (two) times daily. 10/16/16  Yes Blanchie Dessert, MD  promethazine (PHENERGAN) 25 MG tablet Take 25 mg by mouth every 6 (six)  hours as needed for nausea or vomiting.   Yes Historical Provider, MD  diclofenac sodium (VOLTAREN) 1 % GEL Apply 4 g topically 4 (four) times daily. Patient not taking: Reported on 10/29/2016 10/16/16   Blanchie Dessert, MD  predniSONE (DELTASONE) 20 MG tablet Take 2 tablets (40 mg total) by mouth daily. Patient not taking: Reported on 10/29/2016 10/16/16   Blanchie Dessert, MD    Family History History reviewed. No pertinent family history.  Social History Social History  Substance Use Topics  . Smoking status: Current Some Day Smoker  . Smokeless tobacco: Never Used  . Alcohol use Yes     Allergies   Patient has no known allergies.   Review of Systems Review of Systems  Constitutional: Negative for chills and fever.  HENT: Negative for congestion.   Eyes: Negative for visual disturbance.  Respiratory: Negative for cough and shortness of breath.   Cardiovascular: Positive for leg swelling (Right). Negative for chest pain and palpitations.  Gastrointestinal: Negative for abdominal pain, nausea and vomiting.  Genitourinary: Negative for dysuria.  Musculoskeletal: Positive for back pain.  Skin: Positive for color change.  Neurological: Negative for headaches.     Physical Exam Updated Vital Signs BP 123/77   Pulse 116   Temp 97.8 F (36.6 C)   Resp 18   SpO2 94%   Physical Exam  Constitutional: He is oriented to person, place, and time. He appears  well-developed and well-nourished. No distress.  Jaundiced.   HENT:  Head: Normocephalic and atraumatic.  Cardiovascular: Normal rate, regular rhythm and normal heart sounds.   No murmur heard. Pulmonary/Chest: Effort normal and breath sounds normal. No respiratory distress. He has no wheezes. He has no rales.  Abdominal: Soft. He exhibits no distension. There is no tenderness.  Musculoskeletal:  5/5 muscle strength of bilateral LE's. Decreased ROM of RLE which appears to be 2/2 pain. + lumbar midline tenderness.     Neurological: He is alert and oriented to person, place, and time.  Skin: Skin is warm and dry.  Significant erythema and warmth to the touch of right lower leg from mid-thigh down to ankle. + RLE swelling.   Nursing note and vitals reviewed.    ED Treatments / Results  Labs (all labs ordered are listed, but only abnormal results are displayed) Labs Reviewed  CBC WITH DIFFERENTIAL/PLATELET  BASIC METABOLIC PANEL  SEDIMENTATION RATE  C-REACTIVE PROTEIN    EKG  EKG Interpretation None       Radiology No results found.  Procedures Procedures (including critical care time)  Medications Ordered in ED Medications - No data to display   Initial Impression / Assessment and Plan / ED Course  I have reviewed the triage vital signs and the nursing notes.  Pertinent labs & imaging results that were available during my care of the patient were reviewed by me and considered in my medical decision making (see chart for details).    [name] is a 36 y.o. male who presents to ED from outpatient MRI by orthopedics. MRI reviewed showing L4-L5 findings consistent with right facet joint septic arthritis along with soft tissue abscess adjacently in the posterior paraspinous musculature, surrounding myositis is present. Also notes development of epidural fluid collection suspicious for phlegmon or abscess. Of note, report mentions diffusely abnormal marrow signal intensity for age most likely due to red marrow reconversion daily marrow replacing disorder cannot be excluded. Patient also has cellulitis of RLE which was demarcated.  CBC shows white count of 19.3. Platelet count of 34. Blood cultures were obtained and patient started on Vanc and Zosyn. IV fluids initiated as well.   Patient has dx of hep C and ETOH use. Bilirubin today of 14. Last total bili was March 2017 where bili was 1.2.   2:31 PM - Case discussed with infectious disease, Dr. Megan Salon, who recommends vancomycin for  antibiotics.   Orthopedic PA has evaluated patient, please see note for further details. Ortho recommending hospitalist admission with transfer to [facility]. Possibly to the OR, however with bilirubin value and platelet value, will need medicine for evaluation prior.  Hospitalist consulted who will admit. Recommending GI consultation as well.   4:29 PM - Discussed case with GI, PA Azucena Freed. Patient will be evaluated by GI in the morning.   Patient discussed with Dr. Jeneen Rinks who agrees with treatment plan.    Final Clinical Impressions(s) / ED Diagnoses   Final diagnoses:  None    New Prescriptions New Prescriptions   No medications on file     [facility] Nohea Kras, PA-C 10/29/16 1640    Tanna Furry, MD 11/06/16 2312

## 2016-10-29 NOTE — H&P (Signed)
Patient ID: [name] MRN: [id number] DOB/AGE: September 25, 1981 36 y.o.  Admit date: 10/29/2016  Admission Diagnoses:  Hepatitis C Septic Facet joint arthritis abcess  HPI: Pleasant 36 year old with a past med hx of hepatitis C.  Pt presented to his pain management practice last week with increasing back pain and right lower extremity cellulitis and edema.  Pt was sent to Cardiology office.  Doppler was performed and ruled out a DVT.  Pt was diagnosed with a bakers cyst.  He presented to his pain management provider again today who did a STAT MRI with contrast.  MRI was positive for L4-5 Septic Right Facet arthritis with development of a absess within the paraspinal musculature. L2-3 and L3-4 disc extrusions also noted.    Past Medical History: Past Medical History:  Diagnosis Date  . Hepatitis C     Surgical History: Past Surgical History:  Procedure Laterality Date  . HERNIA REPAIR      Family History: History reviewed. No pertinent family history.  Social History: Social History   Social History  . Marital status: Single    Spouse name: N/A  . Number of children: N/A  . Years of education: N/A   Occupational History  . Not on file.   Social History Main Topics  . Smoking status: Current Some Day Smoker  . Smokeless tobacco: Never Used  . Alcohol use Yes  . Drug use: No  . Sexual activity: Not on file   Other Topics Concern  . Not on file   Social History Narrative  . No narrative on file    Allergies: Patient has no known allergies.  Medications: I have reviewed the patient's current medications.  Vital Signs: Patient Vitals for the past 24 hrs:  BP Temp Pulse Resp SpO2  10/29/16 1430 130/81 - 112 - 95 %  10/29/16 1311 122/72 - 108 18 93 %  10/29/16 1136 123/77 97.8 F (36.6 C) 116 18 94 %    Radiology: No results found.  Labs:  Recent Labs  10/29/16 1214  WBC 19.3*  RBC 3.42*  HCT 32.2*  PLT 34*    Recent Labs  10/29/16 1214    NA 132*  K 3.7  CL 100*  CO2 21*  BUN 17  CREATININE 0.67  GLUCOSE 168*  CALCIUM 7.7*   No results for input(s): LABPT, INR in the last 72 hours.  Review of Systems: ROS  Physical Exam: Neurologically intact ABD soft Sensation intact distally Dorsiflexion/Plantar flexion intact Compartment soft  Cellulitis and edema of RLE noted Yellowing of eyes and skin noted  Assessment and Plan: Hospitalist being consulted - Abnormal labs because of Hep C, considered high risk Transferring Pt to [facility] for pending surgical intervention GI is also being consulted  Ronette Deter, PAC for Melina Schools, MD [place] [phone]  Patient with 2 week history of progressive back and right leg pain. Presented to my partner Dr Nelva Bush and imaging obtained.  Instructed to go to ER today due to worsening pain. Per patient and wife he was not jaundice yesterday - the jaundice appearance begin in the last 24 hrs. He denies fevers or chills.  Clinical exam:  Neuro:   5/5 motor strength in the LE bilaterally   Sensation to LT intact bilaterally   Negative babinski/clonus   Denies incontinence of bowel/bladder   Negative st leg raise Compartments soft/NT Swelling noted in right LE  1+ DP/PT pulses bilaterally Abd soft/NT.  Negative rebound tenderness No  SOB/CP  Labs: BUN: 17  AST/ALT: 190/64  WBC: 19.3  Spoke with medical team (hospitalist) Patient at high risk for general anesthesia  Unexplained elevated bilirubin, history for Hep C  Paitent requires GI/Liver work-up Spoke at length with patient and family (mother, wife) Explained risks and benefits of surgery.   MRI does demonstrate abscess from right L4/5 facet with tracks posteriorly into paraspinal muscles and into the lateral recess.   Patient without focal neuro deficits.  Recommend Gill decompression and wash-out.  However, given the fact that he is at high risk I have great concerns about moving forward with  surgery.  Per medical team they did not feel that surgery is advisable. Plan admit to medical service Consult ID team and start broad spectrum abx's to address cellulitis and spine infection Will monitor exam. Once patient is more medically stable will plan on moving forward with surgery

## 2016-10-30 ENCOUNTER — Inpatient Hospital Stay (HOSPITAL_COMMUNITY): Payer: BLUE CROSS/BLUE SHIELD

## 2016-10-30 DIAGNOSIS — D696 Thrombocytopenia, unspecified: Secondary | ICD-10-CM | POA: Diagnosis present

## 2016-10-30 DIAGNOSIS — F1020 Alcohol dependence, uncomplicated: Secondary | ICD-10-CM | POA: Diagnosis present

## 2016-10-30 DIAGNOSIS — E871 Hypo-osmolality and hyponatremia: Secondary | ICD-10-CM

## 2016-10-30 DIAGNOSIS — B182 Chronic viral hepatitis C: Secondary | ICD-10-CM

## 2016-10-30 DIAGNOSIS — F17200 Nicotine dependence, unspecified, uncomplicated: Secondary | ICD-10-CM

## 2016-10-30 DIAGNOSIS — G061 Intraspinal abscess and granuloma: Secondary | ICD-10-CM

## 2016-10-30 DIAGNOSIS — K759 Inflammatory liver disease, unspecified: Secondary | ICD-10-CM

## 2016-10-30 DIAGNOSIS — L03115 Cellulitis of right lower limb: Secondary | ICD-10-CM | POA: Diagnosis present

## 2016-10-30 DIAGNOSIS — M4620 Osteomyelitis of vertebra, site unspecified: Secondary | ICD-10-CM

## 2016-10-30 DIAGNOSIS — M4656 Other infective spondylopathies, lumbar region: Secondary | ICD-10-CM

## 2016-10-30 DIAGNOSIS — M7121 Synovial cyst of popliteal space [Baker], right knee: Secondary | ICD-10-CM

## 2016-10-30 DIAGNOSIS — M7989 Other specified soft tissue disorders: Secondary | ICD-10-CM

## 2016-10-30 DIAGNOSIS — Z8781 Personal history of (healed) traumatic fracture: Secondary | ICD-10-CM

## 2016-10-30 DIAGNOSIS — K7460 Unspecified cirrhosis of liver: Secondary | ICD-10-CM

## 2016-10-30 LAB — CBC
HEMATOCRIT: 31.7 % — AB (ref 39.0–52.0)
Hemoglobin: 10.8 g/dL — ABNORMAL LOW (ref 13.0–17.0)
MCH: 32.2 pg (ref 26.0–34.0)
MCHC: 34.1 g/dL (ref 30.0–36.0)
MCV: 94.6 fL (ref 78.0–100.0)
Platelets: 51 10*3/uL — ABNORMAL LOW (ref 150–400)
RBC: 3.35 MIL/uL — AB (ref 4.22–5.81)
RDW: 16.4 % — ABNORMAL HIGH (ref 11.5–15.5)
WBC: 15.5 10*3/uL — ABNORMAL HIGH (ref 4.0–10.5)

## 2016-10-30 LAB — COMPREHENSIVE METABOLIC PANEL
ALT: 69 U/L — AB (ref 17–63)
ANION GAP: 10 (ref 5–15)
AST: 227 U/L — AB (ref 15–41)
Albumin: 1.8 g/dL — ABNORMAL LOW (ref 3.5–5.0)
Alkaline Phosphatase: 189 U/L — ABNORMAL HIGH (ref 38–126)
BUN: 9 mg/dL (ref 6–20)
CALCIUM: 7.4 mg/dL — AB (ref 8.9–10.3)
CO2: 21 mmol/L — ABNORMAL LOW (ref 22–32)
Chloride: 103 mmol/L (ref 101–111)
Creatinine, Ser: 0.67 mg/dL (ref 0.61–1.24)
GFR calc non Af Amer: >60 mL/min (ref >60)
GLUCOSE: 89 mg/dL (ref 65–99)
POTASSIUM: 3.7 mmol/L (ref 3.5–5.1)
Sodium: 134 mmol/L — ABNORMAL LOW (ref 135–145)
TOTAL PROTEIN: 6 g/dL — AB (ref 6.5–8.1)
Total Bilirubin: 14.1 mg/dL — ABNORMAL HIGH (ref 0.3–1.2)

## 2016-10-30 LAB — HCV RNA QUANT
HCV Quantitative Log: 1.477 log10 IU/mL — ABNORMAL LOW (ref >1.70)
HCV Quantitative: 30 IU/mL — ABNORMAL LOW (ref >50)

## 2016-10-30 LAB — MAGNESIUM: MAGNESIUM: 1.7 mg/dL (ref 1.7–2.4)

## 2016-10-30 LAB — PROTIME-INR
INR: 2.04
Prothrombin Time: 23.3 seconds — ABNORMAL HIGH (ref 11.4–15.2)

## 2016-10-30 LAB — APTT: APTT: 43 s — AB (ref 24–36)

## 2016-10-30 LAB — MRSA PCR SCREENING: MRSA BY PCR: NEGATIVE

## 2016-10-30 MED ORDER — VITAMIN B-1 100 MG PO TABS
100.0000 mg | ORAL_TABLET | Freq: Every day | ORAL | Status: DC
  Administered 2016-10-30 – 2016-10-31 (×2): 100 mg via ORAL
  Filled 2016-10-30 (×2): qty 1

## 2016-10-30 MED ORDER — FOLIC ACID 1 MG PO TABS
1.0000 mg | ORAL_TABLET | Freq: Every day | ORAL | Status: DC
  Administered 2016-10-30 – 2016-10-31 (×2): 1 mg via ORAL
  Filled 2016-10-30 (×2): qty 1

## 2016-10-30 MED ORDER — LORAZEPAM 1 MG PO TABS
1.0000 mg | ORAL_TABLET | Freq: Four times a day (QID) | ORAL | Status: DC | PRN
  Administered 2016-10-31: 1 mg via ORAL
  Filled 2016-10-30: qty 1

## 2016-10-30 MED ORDER — LORAZEPAM 2 MG/ML IJ SOLN: 0.0000 mg | Freq: Two times a day (BID) | INTRAMUSCULAR | Status: DC

## 2016-10-30 MED ORDER — PREDNISOLONE 5 MG PO TABS: 40.0000 mg | ORAL_TABLET | Freq: Every day | ORAL | Status: DC

## 2016-10-30 MED ORDER — LORAZEPAM 2 MG/ML IJ SOLN
0.0000 mg | Freq: Four times a day (QID) | INTRAMUSCULAR | Status: DC
Start: 2016-10-30 — End: 2016-10-31
  Administered 2016-10-31: 1 mg via INTRAVENOUS
  Administered 2016-10-31: 2 mg via INTRAVENOUS
  Filled 2016-10-30 (×4): qty 1
  Filled 2016-10-30: qty 2

## 2016-10-30 MED ORDER — GADOBENATE DIMEGLUMINE 529 MG/ML IV SOLN
20.0000 mL | Freq: Once | INTRAVENOUS | Status: AC | PRN
  Administered 2016-10-31: 20 mL via INTRAVENOUS

## 2016-10-30 MED ORDER — ADULT MULTIVITAMIN W/MINERALS CH
1.0000 | ORAL_TABLET | Freq: Every day | ORAL | Status: DC
  Administered 2016-10-30 – 2016-10-31 (×2): 1 via ORAL
  Filled 2016-10-30 (×2): qty 1

## 2016-10-30 MED ORDER — PREDNISONE 20 MG PO TABS
40.0000 mg | ORAL_TABLET | Freq: Every day | ORAL | Status: DC
  Administered 2016-10-30 – 2016-10-31 (×2): 40 mg via ORAL
  Filled 2016-10-30 (×2): qty 2

## 2016-10-30 MED ORDER — DEXTROSE 5 % IV SOLN
2.0000 g | Freq: Two times a day (BID) | INTRAVENOUS | Status: DC
  Administered 2016-10-30 – 2016-11-01 (×5): 2 g via INTRAVENOUS
  Filled 2016-10-30 (×7): qty 2

## 2016-10-30 MED ORDER — MAGNESIUM SULFATE 50 % IJ SOLN
3.0000 g | Freq: Once | INTRAVENOUS | Status: AC
  Administered 2016-10-30: 3 g via INTRAVENOUS
  Filled 2016-10-30: qty 6

## 2016-10-30 MED ORDER — LORAZEPAM 2 MG/ML IJ SOLN: 1.0000 mg | Freq: Four times a day (QID) | INTRAMUSCULAR | Status: DC | PRN

## 2016-10-30 MED ORDER — THIAMINE HCL 100 MG/ML IJ SOLN: 100.0000 mg | Freq: Every day | INTRAMUSCULAR | Status: DC

## 2016-10-30 NOTE — Progress Notes (Signed)
Pharmacy Antibiotic Note  [name] is a 36 y.o. male admitted on 10/29/2016 with bacteremia and epidural abscess and cellulitis.  Pharmacy has been consulted for vancomycin and cefepime dosing.  Will dose for trough 15-20 for epidural abscess.   Plan:  Vancomycin 1gm IV q8h   D/c Zosyn  Start cefepime 2 gm IV q 12 hours  Monitor clinical progress, c/s, renal function, abx plan/LOT, VT@SS  as indicated   Temp (24hrs), Avg:98.3 F (36.8 C), Min:97.8 F (36.6 C), Max:98.9 F (37.2 C)   Recent Labs Lab 10/29/16 1214 10/29/16 1630 10/29/16 1643 10/30/16 0514  WBC 19.3*  --   --  15.5*  CREATININE 0.67  --   --  0.67  LATICACIDVEN  --  1.6 1.98*  --     Estimated Creatinine Clearance: 169.3 mL/min (by C-G formula based on SCr of 0.67 mg/dL).    No Known Allergies  Antimicrobials this admission: 2/2 cefepime>> 2/1 vancomycin >> 2/1 zosyn>>2/2   Dose adjustments this admission:  Microbiology results: 2/1 BCx: sent 2/1 MRSA PCR negative   Thank you for allowing [place] to participate in this patients care.  Jens Som, PharmD Clinical phone for 10/30/2016 from 7a-3:30p: [phone] If after 3:30p, please call main pharmacy at: [phone] 10/30/2016 10:53 AM

## 2016-10-30 NOTE — Consult Note (Signed)
Date of Admission:  10/29/2016  Date of Consult:  10/30/2016  Reason for Consult: Epidural abscess in patient with swollen leg, Chronic Hepatitis C and alcoholic hepatitis admitted with acute liver failure  Referring Physician: Dr. Grandville Silos   HPI: [name] is an 36 y.o. male with a past medical history significant for chronic hepatitis C without hepatic coma, alcoholic cirrhosis who has been followed by hepatology as an outpatient. He had been developing worsening back pain and right lower extremity edema and erythema. Back pain dates back to January 19. He was seen in the ER and discharged on a pain medication. Result was seen by Dr. Lanae Crumbly with orthopedic surgery and underwent an MRI which showed L4 and L5 right facet joint septic arthritis with soft tissue abscess in the adjacent posterior paraspinous musculature 1.3 x 1.7 x 4.6 cm in dimension. He report pain as sharp, shooting pain, radiates to right LE. Is difficult for him to walk due to pain and swelling right LE.  He came to the ER acutely due to his back pain leg pain and also acute worsening of jaundice. His bilirubin has skyrocketed from when checked as an outpatient it was 1.3 in March and now is 49. INR is 2.1 (not far of baseline. Platlets are 34 K on admission lower than his baseline thrombocytopenia.  Dr. Grandville Silos and wanted to operate but felt that anesthesia was a high risk given the patient's worsening hepatic function. Patient also has significant swelling in his right thigh through [place] had an ultrasound to rule out deep venous thrombosis. This did not show deep venous thrombosis but a Baker's cyst.   Past Medical History:  Diagnosis Date  . Hepatitis C     Past Surgical History:  Procedure Laterality Date  . HERNIA REPAIR      Social History:  reports that he has been smoking.  He has never used smokeless tobacco. He reports that he drinks alcohol. He reports that he does not use drugs.   History  reviewed. No pertinent family history.  No Known Allergies   Medications: I have reviewed patients current medications as documented in Epic Anti-infectives    Start     Dose/Rate Route Frequency Ordered Stop   10/30/16 1000  ceFEPIme (MAXIPIME) 2 g in dextrose 5 % 50 mL IVPB     2 g 100 mL/hr over 30 Minutes Intravenous Every 12 hours 10/30/16 0939     10/29/16 2200  vancomycin (VANCOCIN) IVPB 1000 mg/200 mL premix     1,000 mg 200 mL/hr over 60 Minutes Intravenous Every 8 hours 10/29/16 1412     10/29/16 2200  piperacillin-tazobactam (ZOSYN) IVPB 3.375 g  Status:  Discontinued     3.375 g 12.5 mL/hr over 240 Minutes Intravenous Every 8 hours 10/29/16 1412 10/29/16 1430   10/29/16 1300  vancomycin (VANCOCIN) IVPB 1000 mg/200 mL premix     1,000 mg 200 mL/hr over 60 Minutes Intravenous  Once 10/29/16 1237 10/29/16 1519   10/29/16 1300  piperacillin-tazobactam (ZOSYN) IVPB 3.375 g     3.375 g 100 mL/hr over 30 Minutes Intravenous  Once 10/29/16 1237 10/29/16 1340         ROS:  as in HPI otherwise remainder of 12 point Review of Systems is negative    Blood pressure 140/85, pulse (!) 112, temperature 98.2 F (36.8 C), temperature source Oral, resp. rate (!) 24, height _0  (1.803 m), weight 263 lb 4.8 oz (119.4 kg), SpO2  99 %. General: Alert and awake, oriented x3, not in any acute distress. jaundiced HEENT: nicteric sclera,  EOMI, oropharynx clear and without exudate Cardiovascular: tachy rate, normal r,  no murmur rubs or gallops Pulmonary: clear to scultation bilaterally, no wheezing, rales or rhonchi Gastrointestinal: soft nontender, nondistended, normal bowel sounds, Musculoskeletal:  Right leg is massively swollen with erythema more so posteriorly  10/30/16:    Right foot where he had broken toe       Neuro: nonfocal, strength and sensation intact   Results for orders placed or performed during the hospital encounter of 10/29/16 (from the past 48 hour(s))    CBC with Differential     Status: Abnormal   Collection Time: 10/29/16 12:14 PM  Result Value Ref Range   WBC 19.3 (H) 4.0 - 10.5 K/uL   RBC 3.42 (L) 4.22 - 5.81 MIL/uL   Hemoglobin 11.4 (L) 13.0 - 17.0 g/dL   HCT 32.2 (L) 39.0 - 52.0 %   MCV 94.2 78.0 - 100.0 fL   MCH 33.3 26.0 - 34.0 pg   MCHC 35.4 30.0 - 36.0 g/dL   RDW 16.4 (H) 11.5 - 15.5 %   Platelets 34 (L) 150 - 400 K/uL    Comment: SPECIMEN CHECKED FOR CLOTS REPEATED TO VERIFY PLATELET COUNT CONFIRMED BY SMEAR    Neutrophils Relative % 78 %   Lymphocytes Relative 10 %   Monocytes Relative 11 %   Eosinophils Relative 0 %   Basophils Relative 1 %   Neutro Abs 15.1 (H) 1.7 - 7.7 K/uL   Lymphs Abs 1.9 0.7 - 4.0 K/uL   Monocytes Absolute 2.1 (H) 0.1 - 1.0 K/uL   Eosinophils Absolute 0.0 0.0 - 0.7 K/uL   Basophils Absolute 0.2 (H) 0.0 - 0.1 K/uL   WBC Morphology TOXIC GRANULATION   Basic metabolic panel     Status: Abnormal   Collection Time: 10/29/16 12:14 PM  Result Value Ref Range   Sodium 132 (L) 135 - 145 mmol/L   Potassium 3.7 3.5 - 5.1 mmol/L   Chloride 100 (L) 101 - 111 mmol/L   CO2 21 (L) 22 - 32 mmol/L   Glucose, Bld 168 (H) 65 - 99 mg/dL   BUN 17 6 - 20 mg/dL   Creatinine, Ser 0.67 0.61 - 1.24 mg/dL   Calcium 7.7 (L) 8.9 - 10.3 mg/dL   GFR calc non Af Amer >60 >60 mL/min   GFR calc Af Amer >60 >60 mL/min    Comment: (NOTE) The eGFR has been calculated using the CKD EPI equation. This calculation has not been validated in all clinical situations. eGFR's persistently <60 mL/min signify possible Chronic Kidney Disease.    Anion gap 11 5 - 15  Sedimentation rate     Status: Abnormal   Collection Time: 10/29/16 12:14 PM  Result Value Ref Range   Sed Rate 70 (H) 0 - 16 mm/hr  Hepatic function panel     Status: Abnormal   Collection Time: 10/29/16 12:14 PM  Result Value Ref Range   Total Protein 6.3 (L) 6.5 - 8.1 g/dL   Albumin 2.1 (L) 3.5 - 5.0 g/dL   AST 190 (H) 15 - 41 U/L   ALT 64 (H) 17 - 63 U/L    Alkaline Phosphatase 222 (H) 38 - 126 U/L   Total Bilirubin 14.0 (H) 0.3 - 1.2 mg/dL   Bilirubin, Direct 8.7 (H) 0.1 - 0.5 mg/dL   Indirect Bilirubin 5.3 (H) 0.3 - 0.9 mg/dL  C-reactive  protein     Status: Abnormal   Collection Time: 10/29/16 12:16 PM  Result Value Ref Range   CRP 17.3 (H) <1.0 mg/dL    Comment: Performed at [facility] [address], [place], [place] [address]  Culture, blood (routine x 2)     Status: None (Preliminary result)   Collection Time: 10/29/16 12:52 PM  Result Value Ref Range   Specimen Description BLOOD RIGHT ANTECUBITAL    Special Requests BOTTLES DRAWN AEROBIC AND ANAEROBIC 5 ML    Culture      NO GROWTH < 24 HOURS Performed at [facility], [place] [address] [place], [place] [address]    Report Status PENDING   Culture, blood (routine x 2)     Status: None (Preliminary result)   Collection Time: 10/29/16 12:56 PM  Result Value Ref Range   Specimen Description BLOOD LEFT HAND    Special Requests IN PEDIATRIC BOTTLE 2 ML    Culture      NO GROWTH < 24 HOURS Performed at [facility] [address] [place], [place] [address]    Report Status PENDING   Protime-INR     Status: Abnormal   Collection Time: 10/29/16  4:30 PM  Result Value Ref Range   Prothrombin Time 22.6 (H) 11.4 - 15.2 seconds   INR 1.96   APTT     Status: Abnormal   Collection Time: 10/29/16  4:30 PM  Result Value Ref Range   aPTT 38 (H) 24 - 36 seconds    Comment:        IF BASELINE aPTT IS ELEVATED, SUGGEST PATIENT RISK ASSESSMENT BE USED TO DETERMINE APPROPRIATE ANTICOAGULANT THERAPY.   Lactic acid, plasma     Status: None   Collection Time: 10/29/16  4:30 PM  Result Value Ref Range   Lactic Acid, Venous 1.6 0.5 - 1.9 mmol/L  I-Stat CG4 Lactic Acid, ED     Status: Abnormal   Collection Time: 10/29/16  4:43 PM  Result Value Ref Range   Lactic Acid, Venous 1.98 (HH) 0.5 - 1.9 mmol/L  MRSA PCR Screening     Status: None   Collection Time:  10/29/16  9:30 PM  Result Value Ref Range   MRSA by PCR NEGATIVE NEGATIVE    Comment:        The GeneXpert MRSA Assay (FDA approved for NASAL specimens only), is one component of a comprehensive MRSA colonization surveillance program. It is not intended to diagnose MRSA infection nor to guide or monitor treatment for MRSA infections.   Comprehensive metabolic panel     Status: Abnormal   Collection Time: 10/30/16  5:14 AM  Result Value Ref Range   Sodium 134 (L) 135 - 145 mmol/L   Potassium 3.7 3.5 - 5.1 mmol/L   Chloride 103 101 - 111 mmol/L   CO2 21 (L) 22 - 32 mmol/L   Glucose, Bld 89 65 - 99 mg/dL   BUN 9 6 - 20 mg/dL   Creatinine, Ser 0.67 0.61 - 1.24 mg/dL   Calcium 7.4 (L) 8.9 - 10.3 mg/dL   Total Protein 6.0 (L) 6.5 - 8.1 g/dL   Albumin 1.8 (L) 3.5 - 5.0 g/dL   AST 227 (H) 15 - 41 U/L   ALT 69 (H) 17 - 63 U/L   Alkaline Phosphatase 189 (H) 38 - 126 U/L   Total Bilirubin 14.1 (H) 0.3 - 1.2 mg/dL   GFR calc non Af Amer >60 >60  mL/min   GFR calc Af Amer >60 >60 mL/min    Comment: (NOTE) The eGFR has been calculated using the CKD EPI equation. This calculation has not been validated in all clinical situations. eGFR's persistently <60 mL/min signify possible Chronic Kidney Disease.    Anion gap 10 5 - 15  CBC     Status: Abnormal   Collection Time: 10/30/16  5:14 AM  Result Value Ref Range   WBC 15.5 (H) 4.0 - 10.5 K/uL   RBC 3.35 (L) 4.22 - 5.81 MIL/uL   Hemoglobin 10.8 (L) 13.0 - 17.0 g/dL   HCT 31.7 (L) 39.0 - 52.0 %   MCV 94.6 78.0 - 100.0 fL   MCH 32.2 26.0 - 34.0 pg   MCHC 34.1 30.0 - 36.0 g/dL   RDW 16.4 (H) 11.5 - 15.5 %   Platelets 51 (L) 150 - 400 K/uL    Comment: PLATELET COUNT CONFIRMED BY SMEAR  Protime-INR     Status: Abnormal   Collection Time: 10/30/16  5:14 AM  Result Value Ref Range   Prothrombin Time 23.3 (H) 11.4 - 15.2 seconds   INR 2.04   APTT     Status: Abnormal   Collection Time: 10/30/16  5:14 AM  Result Value Ref Range   aPTT  43 (H) 24 - 36 seconds    Comment:        IF BASELINE aPTT IS ELEVATED, SUGGEST PATIENT RISK ASSESSMENT BE USED TO DETERMINE APPROPRIATE ANTICOAGULANT THERAPY.   Magnesium     Status: None   Collection Time: 10/30/16  9:27 AM  Result Value Ref Range   Magnesium 1.7 1.7 - 2.4 mg/dL   _0 (sdes,specrequest,cult,reptstatus)   ) Recent Results (from the past 720 hour(s))  Culture, blood (routine x 2)     Status: None (Preliminary result)   Collection Time: 10/29/16 12:52 PM  Result Value Ref Range Status   Specimen Description BLOOD RIGHT ANTECUBITAL  Final   Special Requests BOTTLES DRAWN AEROBIC AND ANAEROBIC 5 ML  Final   Culture   Final    NO GROWTH < 24 HOURS Performed at [facility], [name] [address], [place], [place] [address]    Report Status PENDING  Incomplete  Culture, blood (routine x 2)     Status: None (Preliminary result)   Collection Time: 10/29/16 12:56 PM  Result Value Ref Range Status   Specimen Description BLOOD LEFT HAND  Final   Special Requests IN PEDIATRIC BOTTLE 2 ML  Final   Culture   Final    NO GROWTH < 24 HOURS Performed at [facility], [place] [address] [place], [place] [address]    Report Status PENDING  Incomplete  MRSA PCR Screening     Status: None   Collection Time: 10/29/16  9:30 PM  Result Value Ref Range Status   MRSA by PCR NEGATIVE NEGATIVE Final    Comment:        The GeneXpert MRSA Assay (FDA approved for NASAL specimens only), is one component of a comprehensive MRSA colonization surveillance program. It is not intended to diagnose MRSA infection nor to guide or monitor treatment for MRSA infections.      Impression/Recommendation  Principal Problem:   Epidural abscess Active Problems:   Liver failure (HCC)   Hyponatremia   Cellulitis of leg, right   EtOH dependence (HCC)   Thrombocytopenia (HCC)   [name] is a 36 y.o. male with hepatitis C , alcoholic liver disease found now  to have  large epidural abscess L4-L5 facet septic arthritis, swollen leg who has been admitted with acute liver failure with bilirubin that is gone up to 14.  #1 Epidural abscess with L4-L5 facet septic arthritis:  Orthopedic surgery would like to operate on him but are concerned about risks of anesthesia. Patient is being evaluated by hepatology would defer estimation of operative risk in the context of his liver disease to gastroenterology.  In the meantime I will continue vancomycin and change Zosyn for cefepime  #2 Swollen leg I'm concerned that he may have potential pyomyositis in the thigh and lower calf and I'm ordering MRI of the thigh and calf: If he indeed has pyomyositis he will also need a surgical intervention here.  #3 Acute hepatic failure: Gastric neurology believes this is partly due to alcoholic  Hepatitis superimposed on his chronic liver disease.  His MELD score (though acutely calculated is 26 with 19.6% 3 month mortality.  He is going to need a  liver transplant with #s like these,  IF he can come off of his alcohol.  He would not typically be treated for HCV until post transplant given severity of his liver disease  He is being started on steroids for alcoholic hepatitis.   Dr. Johnnye Sima will check on the patient this weekend.   10/30/2016, 3:39 PM   Thank you so much for this interesting consult  [place] for [place] [phone] (pager) [phone] (office) 10/30/2016, 3:39 PM  Rhina [place] 10/30/2016, 3:39 PM

## 2016-10-30 NOTE — Progress Notes (Signed)
    Subjective: Procedure(s) (LRB): IRRIGATION AND DEBRIDEMENT LUMBAR WOUND W/ LUMBAR DECOMPRESSION (N/A) LUMBAR LAMINECTOMY/DECOMPRESSION MICRODISCECTOMY (N/A) 1 Day Post-Op  Patient reports pain as 3 on 0-10 scale.  Reports unchanged leg pain    Positive void Negative bowel movement Positive flatus Negative chest pain or shortness of breath  Objective: Vital signs in last 24 hours: Temp:  [97.8 F (36.6 C)-98.5 F (36.9 C)] 98.4 F (36.9 C) (02/02 0351) Pulse Rate:  [107-131] 107 (02/02 0351) Resp:  [14-45] 24 (02/02 0351) BP: (109-158)/(65-93) 151/73 (02/02 0351) SpO2:  [93 %-98 %] 97 % (02/02 0351) Weight:  [119.4 kg (263 lb 4.8 oz)-120 kg (264 lb 8 oz)] 119.4 kg (263 lb 4.8 oz) (02/02 0351)  Intake/Output from previous day: 02/01 0701 - 02/02 0700 In: 200 [IV Piggyback:200] Out: 400 [Urine:400]  Labs:  Recent Labs  10/29/16 1214 10/30/16 0514  WBC 19.3* 15.5*  RBC 3.42* 3.35*  HCT 32.2* 31.7*  PLT 34* PENDING    Recent Labs  10/29/16 1214 10/30/16 0514  NA 132* 134*  K 3.7 3.7  CL 100* 103  CO2 21* 21*  BUN 17 9  CREATININE 0.67 0.67  GLUCOSE 168* 89  CALCIUM 7.7* 7.4*    Recent Labs  10/29/16 1630 10/30/16 0514  INR 1.96 2.04    Physical Exam: Neurologically intact ABD soft Intact pulses distally Compartment soft  Assessment/Plan: Patient stable  xrays n/a Continue care  Medical work up as documented today Will order TLSO for ambulation Continue care Plan on surgery once medically cleared   Melina Schools, MD [place] [phone]
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
## 2016-10-30 NOTE — Progress Notes (Signed)
Orthopedic Tech Progress Note Patient Details:  [name] 06-Jul-1981 [id number]  Patient ID: Benard Rink, male   DOB: [birth year], 36 y.o.   MRN: [id number]   [place] Pink 10/30/2016, 10:17 AMCalled [employer] for custom TLSO.

## 2016-10-31 DIAGNOSIS — L02415 Cutaneous abscess of right lower limb: Secondary | ICD-10-CM

## 2016-10-31 DIAGNOSIS — K709 Alcoholic liver disease, unspecified: Secondary | ICD-10-CM

## 2016-10-31 DIAGNOSIS — B182 Chronic viral hepatitis C: Secondary | ICD-10-CM

## 2016-10-31 DIAGNOSIS — E870 Hyperosmolality and hypernatremia: Secondary | ICD-10-CM

## 2016-10-31 DIAGNOSIS — G062 Extradural and subdural abscess, unspecified: Secondary | ICD-10-CM

## 2016-10-31 DIAGNOSIS — F10231 Alcohol dependence with withdrawal delirium: Secondary | ICD-10-CM

## 2016-10-31 DIAGNOSIS — F1020 Alcohol dependence, uncomplicated: Secondary | ICD-10-CM

## 2016-10-31 DIAGNOSIS — F1010 Alcohol abuse, uncomplicated: Secondary | ICD-10-CM

## 2016-10-31 DIAGNOSIS — M609 Myositis, unspecified: Secondary | ICD-10-CM

## 2016-10-31 DIAGNOSIS — L03115 Cellulitis of right lower limb: Secondary | ICD-10-CM

## 2016-10-31 DIAGNOSIS — K7010 Alcoholic hepatitis without ascites: Secondary | ICD-10-CM

## 2016-10-31 DIAGNOSIS — B9689 Other specified bacterial agents as the cause of diseases classified elsewhere: Secondary | ICD-10-CM

## 2016-10-31 DIAGNOSIS — K7200 Acute and subacute hepatic failure without coma: Secondary | ICD-10-CM

## 2016-10-31 DIAGNOSIS — R748 Abnormal levels of other serum enzymes: Secondary | ICD-10-CM

## 2016-10-31 LAB — CBC WITH DIFFERENTIAL/PLATELET
Basophils Absolute: 0.2 10*3/uL — ABNORMAL HIGH (ref 0.0–0.1)
Basophils Relative: 1 %
EOS PCT: 1 %
Eosinophils Absolute: 0.2 10*3/uL (ref 0.0–0.7)
HEMATOCRIT: 34.4 % — AB (ref 39.0–52.0)
HEMOGLOBIN: 11.8 g/dL — AB (ref 13.0–17.0)
LYMPHS ABS: 3.5 10*3/uL (ref 0.7–4.0)
Lymphocytes Relative: 20 %
MCH: 32.6 pg (ref 26.0–34.0)
MCHC: 34.3 g/dL (ref 30.0–36.0)
MCV: 95 fL (ref 78.0–100.0)
MONOS PCT: 18 %
Monocytes Absolute: 3.2 10*3/uL — ABNORMAL HIGH (ref 0.1–1.0)
NEUTROS ABS: 10.5 10*3/uL — AB (ref 1.7–7.7)
Neutrophils Relative %: 60 %
Platelets: 57 10*3/uL — ABNORMAL LOW (ref 150–400)
RBC: 3.62 MIL/uL — AB (ref 4.22–5.81)
RDW: 16.6 % — AB (ref 11.5–15.5)
WBC: 17.6 10*3/uL — ABNORMAL HIGH (ref 4.0–10.5)

## 2016-10-31 LAB — HEPATITIS PANEL, ACUTE
HCV Ab: >11 s/co ratio — ABNORMAL HIGH (ref 0.0–0.9)
HEP B S AG: NEGATIVE
Hep A IgM: NEGATIVE
Hep B C IgM: NEGATIVE

## 2016-10-31 LAB — MAGNESIUM: Magnesium: 2.1 mg/dL (ref 1.7–2.4)

## 2016-10-31 LAB — TYPE AND SCREEN
ABO/RH(D): A POS
ANTIBODY SCREEN: NEGATIVE

## 2016-10-31 LAB — COMPREHENSIVE METABOLIC PANEL
ALBUMIN: 1.8 g/dL — AB (ref 3.5–5.0)
ALT: 78 U/L — ABNORMAL HIGH (ref 17–63)
AST: 268 U/L — AB (ref 15–41)
Alkaline Phosphatase: 212 U/L — ABNORMAL HIGH (ref 38–126)
Anion gap: 8 (ref 5–15)
BUN: 7 mg/dL (ref 6–20)
CO2: 23 mmol/L (ref 22–32)
Calcium: 7.7 mg/dL — ABNORMAL LOW (ref 8.9–10.3)
Chloride: 102 mmol/L (ref 101–111)
Creatinine, Ser: 0.73 mg/dL (ref 0.61–1.24)
GFR calc Af Amer: >60 mL/min (ref >60)
GFR calc non Af Amer: >60 mL/min (ref >60)
GLUCOSE: 80 mg/dL (ref 65–99)
POTASSIUM: 3.6 mmol/L (ref 3.5–5.1)
SODIUM: 133 mmol/L — AB (ref 135–145)
Total Bilirubin: 16 mg/dL — ABNORMAL HIGH (ref 0.3–1.2)
Total Protein: 6.4 g/dL — ABNORMAL LOW (ref 6.5–8.1)

## 2016-10-31 LAB — AMMONIA: Ammonia: 42 umol/L — ABNORMAL HIGH (ref 9–35)

## 2016-10-31 LAB — PROTIME-INR
INR: 2.16
PROTHROMBIN TIME: 24.5 s — AB (ref 11.4–15.2)

## 2016-10-31 LAB — GLUCOSE, CAPILLARY: GLUCOSE-CAPILLARY: 138 mg/dL — AB (ref 65–99)

## 2016-10-31 MED ORDER — PREDNISOLONE 5 MG PO TABS
40.0000 mg | ORAL_TABLET | Freq: Every day | ORAL | Status: DC
  Filled 2016-10-31: qty 8

## 2016-10-31 MED ORDER — LORAZEPAM 2 MG/ML IJ SOLN
1.0000 mg | Freq: Once | INTRAMUSCULAR | Status: AC
  Administered 2016-10-31: 1 mg via INTRAVENOUS
  Filled 2016-10-31: qty 1

## 2016-10-31 MED ORDER — HALOPERIDOL LACTATE 5 MG/ML IJ SOLN
5.0000 mg | Freq: Once | INTRAMUSCULAR | Status: AC
  Administered 2016-10-31: 5 mg via INTRAMUSCULAR
  Filled 2016-10-31 (×2): qty 1

## 2016-10-31 MED ORDER — LORAZEPAM 2 MG/ML IJ SOLN
2.0000 mg | Freq: Once | INTRAMUSCULAR | Status: AC
  Administered 2016-10-31: 2 mg via INTRAVENOUS

## 2016-10-31 MED ORDER — FOLIC ACID 5 MG/ML IJ SOLN
1.0000 mg | Freq: Every day | INTRAMUSCULAR | Status: DC
  Filled 2016-10-31: qty 0.2

## 2016-10-31 MED ORDER — PREDNISOLONE 5 MG PO TABS
40.0000 mg | ORAL_TABLET | Freq: Every day | ORAL | Status: DC
  Administered 2016-10-31: 40 mg via ORAL
  Filled 2016-10-31 (×2): qty 8

## 2016-10-31 MED ORDER — VITAMIN K1 10 MG/ML IJ SOLN
10.0000 mg | Freq: Once | INTRAVENOUS | Status: AC
  Administered 2016-10-31: 10 mg via INTRAVENOUS
  Filled 2016-10-31 (×2): qty 1

## 2016-10-31 MED ORDER — LORAZEPAM 2 MG/ML IJ SOLN: 2.0000 mg | Freq: Once | INTRAMUSCULAR | Status: DC

## 2016-10-31 MED ORDER — LORAZEPAM 2 MG/ML IJ SOLN
INTRAMUSCULAR | Status: AC
  Filled 2016-10-31: qty 2

## 2016-10-31 MED ORDER — DEXMEDETOMIDINE HCL IN NACL 200 MCG/50ML IV SOLN
0.4000 ug/kg/h | INTRAVENOUS | Status: DC
  Administered 2016-10-31: 1.2 ug/kg/h via INTRAVENOUS

## 2016-10-31 MED ORDER — SODIUM CHLORIDE 0.9 % IV SOLN: Freq: Once | INTRAVENOUS | Status: DC

## 2016-10-31 MED ORDER — LACTULOSE 10 GM/15ML PO SOLN
30.0000 g | Freq: Once | ORAL | Status: DC
  Filled 2016-10-31: qty 45

## 2016-10-31 MED ORDER — LORAZEPAM 2 MG/ML IJ SOLN
1.0000 mg | INTRAMUSCULAR | Status: DC | PRN
  Administered 2016-10-31: 4 mg via INTRAVENOUS

## 2016-10-31 MED ORDER — PREDNISOLONE 5 MG PO TABS: 40.0000 mg | ORAL_TABLET | Freq: Every day | ORAL | Status: DC

## 2016-10-31 MED ORDER — METHYLPREDNISOLONE SODIUM SUCC 40 MG IJ SOLR
20.0000 mg | Freq: Two times a day (BID) | INTRAMUSCULAR | Status: DC
  Administered 2016-11-01: 20 mg via INTRAVENOUS
  Filled 2016-10-31 (×2): qty 0.5

## 2016-10-31 NOTE — Progress Notes (Signed)
INFECTIOUS DISEASE PROGRESS NOTE  ID: [name] is a 36 y.o. male with  Principal Problem:   Epidural abscess Active Problems:   Liver failure ([place])   Hyponatremia   Cellulitis of right lower extremity   EtOH dependence ([place])   Thrombocytopenia (HCC)   Cirrhosis (HCC)   Paraspinal abscess (HCC)   Chronic hepatitis C without hepatic coma (HCC)   Hepatitis  Subjective: Continued back and leg pain.   Abtx:  Anti-infectives    Start     Dose/Rate Route Frequency Ordered Stop   10/30/16 1000  ceFEPIme (MAXIPIME) 2 g in dextrose 5 % 50 mL IVPB     2 g 100 mL/hr over 30 Minutes Intravenous Every 12 hours 10/30/16 0939     10/29/16 2200  vancomycin (VANCOCIN) IVPB 1000 mg/200 mL premix     1,000 mg 200 mL/hr over 60 Minutes Intravenous Every 8 hours 10/29/16 1412     10/29/16 2200  piperacillin-tazobactam (ZOSYN) IVPB 3.375 g  Status:  Discontinued     3.375 g 12.5 mL/hr over 240 Minutes Intravenous Every 8 hours 10/29/16 1412 10/29/16 1430   10/29/16 1300  vancomycin (VANCOCIN) IVPB 1000 mg/200 mL premix     1,000 mg 200 mL/hr over 60 Minutes Intravenous  Once 10/29/16 1237 10/29/16 1519   10/29/16 1300  piperacillin-tazobactam (ZOSYN) IVPB 3.375 g     3.375 g 100 mL/hr over 30 Minutes Intravenous  Once 10/29/16 1237 10/29/16 1340      Medications:  Scheduled: . ceFEPime (MAXIPIME) IV  2 g Intravenous Q12H  . folic acid  1 mg Oral Daily  . LORazepam  0-4 mg Intravenous Q6H   Followed by  . [START ON 11/01/2016] LORazepam  0-4 mg Intravenous Q12H  . multivitamin with minerals  1 tablet Oral Daily  . predniSONE  40 mg Oral Q breakfast  . thiamine  100 mg Oral Daily   Or  . thiamine  100 mg Intravenous Daily  . vancomycin  1,000 mg Intravenous Q8H    Objective: Vital signs in last 24 hours: Temp:  [98 F (36.7 C)-98.9 F (37.2 C)] 98 F (36.7 C) (02/03 0825) Pulse Rate:  [96-109] 109 (02/03 0825) Resp:  [17-27] 24 (02/03 0825) BP: (131-156)/(75-87) 143/83  (02/03 0825) SpO2:  [95 %-100 %] 100 % (02/03 0825) Weight:  [120.7 kg (266 lb 1.6 oz)] 120.7 kg (266 lb 1.6 oz) (02/03 0315)   General appearance: alert, cooperative and no distress Resp: clear to auscultation bilaterally Cardio: regular rate and rhythm GI: normal findings: bowel sounds normal and soft, non-tender Extremities: ild erythema across LLE (thigh and calf).   Lab Results  Recent Labs  10/30/16 0514 10/31/16 0416  WBC 15.5* 17.6*  HGB 10.8* 11.8*  HCT 31.7* 34.4*  NA 134* 133*  K 3.7 3.6  CL 103 102  CO2 21* 23  BUN 9 7  CREATININE 0.67 0.73   Liver Panel  Recent Labs  10/29/16 1214 10/30/16 0514 10/31/16 0416  PROT 6.3* 6.0* 6.4*  ALBUMIN 2.1* 1.8* 1.8*  AST 190* 227* 268*  ALT 64* 69* 78*  ALKPHOS 222* 189* 212*  BILITOT 14.0* 14.1* 16.0*  BILIDIR 8.7*  --   --   IBILI 5.3*  --   --    Sedimentation Rate  Recent Labs  10/29/16 1214  ESRSEDRATE 70*   C-Reactive Protein  Recent Labs  10/29/16 1216  CRP 17.3*    Microbiology: Recent Results (from the past 240 hour(s))  Culture,  blood (routine x 2)     Status: None (Preliminary result)   Collection Time: 10/29/16 12:52 PM  Result Value Ref Range Status   Specimen Description BLOOD RIGHT ANTECUBITAL  Final   Special Requests BOTTLES DRAWN AEROBIC AND ANAEROBIC 5 ML  Final   Culture   Final    NO GROWTH < 24 HOURS Performed at [facility], [place] [address] [place], [place] [address]    Report Status PENDING  Incomplete  Culture, blood (routine x 2)     Status: None (Preliminary result)   Collection Time: 10/29/16 12:56 PM  Result Value Ref Range Status   Specimen Description BLOOD LEFT HAND  Final   Special Requests IN PEDIATRIC BOTTLE 2 ML  Final   Culture   Final    NO GROWTH < 24 HOURS Performed at [facility], [place] [address], [place], [place] [address]    Report Status PENDING  Incomplete  MRSA PCR Screening     Status: None   Collection Time: 10/29/16  9:30 PM    Result Value Ref Range Status   MRSA by PCR NEGATIVE NEGATIVE Final    Comment:        The GeneXpert MRSA Assay (FDA approved for NASAL specimens only), is one component of a comprehensive MRSA colonization surveillance program. It is not intended to diagnose MRSA infection nor to guide or monitor treatment for MRSA infections.     Studies/Results: Mr Tibia Fibula Right W Wo Contrast  Result Date: 10/31/2016 CLINICAL DATA:  Severe right lower extremity pain and swelling. History of diskitis and osteomyelitis. EXAM: MRI OF THE RIGHT FEMUR WITHOUT AND WITH CONTRAST; MRI OF LOWER RIGHT EXTREMITY WITHOUT AND WITH CONTRAST TECHNIQUE: Multiplanar, multisequence MR imaging of the right lower extremity was performed both before and after administration of intravenous contrast. CONTRAST:  42mL MULTIHANCE GADOBENATE DIMEGLUMINE 529 MG/ML IV SOLN COMPARISON:  None. FINDINGS: The hip and knee joints are grossly maintained. No definite findings for septic arthritis or osteomyelitis. There is extensive pyomyositis involving the posterior compartment of the thigh. Multiple large abscesses surrounding the hamstring muscles. This also rim enhancing fluid/ abscess along the entire course of the sciatic/femoral nerve which is markedly inflamed. Diffuse subcutaneous soft tissue swelling/ edema involving both lower extremities, right greater than left consistent with cellulitis. I do not see any significant findings below-the-knee other than cellulitis. No myofasciitis or pyomyositis. No osteomyelitis or septic arthritis. IMPRESSION: 1. Extensive abscesses involving the posterior compartment of the thigh along with myofasciitis and cellulitis. There is also abscess all along the course of the sciatic/femoral nerve which is thickened and inflamed. 2. No findings for septic arthritis or osteomyelitis. 3. Cellulitis below the knee but no myofasciitis or pyomyositis. These results will be called to the ordering clinician  or representative by the Radiologist Assistant, and communication documented in the PACS or zVision Dashboard. Electronically Signed   By: Marijo Sanes M.D.   On: 10/31/2016 08:24   Mr Femur Right W Wo Contrast  Result Date: 10/31/2016 CLINICAL DATA:  Severe right lower extremity pain and swelling. History of diskitis and osteomyelitis. EXAM: MRI OF THE RIGHT FEMUR WITHOUT AND WITH CONTRAST; MRI OF LOWER RIGHT EXTREMITY WITHOUT AND WITH CONTRAST TECHNIQUE: Multiplanar, multisequence MR imaging of the right lower extremity was performed both before and after administration of intravenous contrast. CONTRAST:  6mL MULTIHANCE GADOBENATE DIMEGLUMINE 529 MG/ML IV SOLN COMPARISON:  None. FINDINGS: The hip and knee joints are grossly maintained. No definite findings  for septic arthritis or osteomyelitis. There is extensive pyomyositis involving the posterior compartment of the thigh. Multiple large abscesses surrounding the hamstring muscles. This also rim enhancing fluid/ abscess along the entire course of the sciatic/femoral nerve which is markedly inflamed. Diffuse subcutaneous soft tissue swelling/ edema involving both lower extremities, right greater than left consistent with cellulitis. I do not see any significant findings below-the-knee other than cellulitis. No myofasciitis or pyomyositis. No osteomyelitis or septic arthritis. IMPRESSION: 1. Extensive abscesses involving the posterior compartment of the thigh along with myofasciitis and cellulitis. There is also abscess all along the course of the sciatic/femoral nerve which is thickened and inflamed. 2. No findings for septic arthritis or osteomyelitis. 3. Cellulitis below the knee but no myofasciitis or pyomyositis. These results will be called to the ordering clinician or representative by the Radiologist Assistant, and communication documented in the PACS or zVision Dashboard. Electronically Signed   By: Marijo Sanes M.D.   On: 10/31/2016 08:24   Dg  Chest Port 1 View  Result Date: 10/29/2016 CLINICAL DATA:  Preop evaluation prior treatment for epidural abscess and cellulitis. No current chest complaints. Current smoker. EXAM: PORTABLE CHEST 1 VIEW COMPARISON:  None in PACs FINDINGS: Today's radiograph is taken in a lordotic projection. The lungs are well-expanded. The interstitial markings are coarse. There is no alveolar infiltrate or pleural effusion. The heart is top-normal in size. The pulmonary vascularity is not clearly engorged. The bony thorax exhibits no acute abnormality. IMPRESSION: Bilateral interstitial prominence likely reflects the patient's smoking history. No definite pneumonia nor CHF. Electronically Signed   By: David  Martinique M.D.   On: 10/29/2016 17:05   US Abdomen Limited Ruq  Result Date: 10/29/2016 CLINICAL DATA:  Jaundice.  History of hepatitis-C. EXAM: US ABDOMEN LIMITED - RIGHT UPPER QUADRANT COMPARISON:  None. FINDINGS: Gallbladder: Moderate gallbladder mural thickening, measuring 6 mm. No calculi. No tenderness over the gallbladder. Common bile duct: Diameter: Normal, 4.5 mm. Liver: Diffusely increased echogenicity without focal lesion. IMPRESSION: 1. Diffuse hepatic parenchymal echogenicity suggesting steatosis or other hepatocellular disease. 2. Gallbladder mural thickening without calculi or Murphy's sign. This can be seen with hepatocellular disease or portal hypertension. 3. No bile duct dilatation. 4. No ascites in the right upper quadrant. Electronically Signed   By: Andreas Newport M.D.   On: 10/29/2016 20:30     Assessment/Plan: Epidural abscess Hep C  ETOH liver disease Acute hepatic failure  Myositis/Cellulitis/Abscesses RLE  Total days of antibiotics: 2 vanco/cefepime  No change in anbx for now Await his BCx Would have general surgery see him for the abscesses, also consider IR eval. Would query if his small wound on his calf is the start of the deeper infection he has.   His LFTs are slightly  worse.          Bobby Rumpf Infectious Diseases (pager) [phone] [email or web address] 10/31/2016, 11:31 AM  LOS: 2 days

## 2016-10-31 NOTE — Progress Notes (Signed)
Patient ID: [name], male   DOB: 01-21-1981, 36 y.o.   MRN: [id number] Seen on rounds for Dr. Rolena Infante Multiple medical issues with concern for epidural abscess   Per Dr. Rolena Infante awaiting medical clearance to address epidural abscess  Patient reports low back pain and pain radiating down right lower extremity  At time of this note I had just spoken with Hospitalist, Dr. Sherral Hammers, re newly performed MRI ordered by Infectious Disease evaluating the thigh.  I will need to touch base with Rolena Infante and see what his plans are More to follow

## 2016-10-31 NOTE — Progress Notes (Addendum)
[facility] Progress Note  Chief Complaint:   Abnormal liver enzymes, jaundice  Subjective: feels okay overall.   Objective:  Vital signs in last 24 hours: Temp:  [98 F (36.7 C)-99.4 F (37.4 C)] 99.4 F (37.4 C) (02/03 1241) Pulse Rate:  [96-110] 110 (02/03 1241) Resp:  [17-31] 31 (02/03 1241) BP: (131-158)/(75-95) 158/95 (02/03 1241) SpO2:  [95 %-100 %] 95 % (02/03 1241) Weight:  [266 lb 1.6 oz (120.7 kg)] 266 lb 1.6 oz (120.7 kg) (02/03 0315) Last BM Date: 10/30/16   General:   Alert, well-developed, white male in NAD EENT:  Normal hearing, icteric sclera Heart:  Regular rate and rhythm.  Pulm: Normal respiratory effort Abdomen:  Soft, nondistended, nontender.  Normal bowel sounds, no masses felt. No hepatomegaly.    Neurologic:  Alert and  oriented x4;  grossly normal neurologically.No asterixis Psych:  Alert and cooperative. Normal mood and affect. Extremities: RLE edematous :  Intake/Output from previous day: 02/02 0701 - 02/03 0700 In: -  Out: 400 [Urine:400] Intake/Output this shift: Total I/O In: 120 [P.O.:120] Out: 400 [Urine:400]  Lab Results:  Recent Labs  10/29/16 1214 10/30/16 0514 10/31/16 0416  WBC 19.3* 15.5* 17.6*  HGB 11.4* 10.8* 11.8*  HCT 32.2* 31.7* 34.4*  PLT 34* 51* 57*   BMET  Recent Labs  10/29/16 1214 10/30/16 0514 10/31/16 0416  NA 132* 134* 133*  K 3.7 3.7 3.6  CL 100* 103 102  CO2 21* 21* 23  GLUCOSE 168* 89 80  BUN 17 9 7   CREATININE 0.67 0.67 0.73  CALCIUM 7.7* 7.4* 7.7*   LFT  Recent Labs  10/29/16 1214  10/31/16 0416  PROT 6.3*  < > 6.4*  ALBUMIN 2.1*  < > 1.8*  AST 190*  < > 268*  ALT 64*  < > 78*  ALKPHOS 222*  < > 212*  BILITOT 14.0*  < > 16.0*  BILIDIR 8.7*  --   --   IBILI 5.3*  --   --   < > = values in this interval not displayed. PT/INR  Recent Labs  10/30/16 0514 10/31/16 0416  LABPROT 23.3* 24.5*  INR 2.04 2.16   Hepatitis Panel  Recent Labs  10/30/16 0927    HEPBSAG Negative  HCVAB >11.0*  HEPAIGM Negative  HEPBIGM Negative    Mr Tibia Fibula Right W Wo Contrast  Result Date: 10/31/2016 CLINICAL DATA:  Severe right lower extremity pain and swelling. History of diskitis and osteomyelitis. EXAM: MRI OF THE RIGHT FEMUR WITHOUT AND WITH CONTRAST; MRI OF LOWER RIGHT EXTREMITY WITHOUT AND WITH CONTRAST TECHNIQUE: Multiplanar, multisequence MR imaging of the right lower extremity was performed both before and after administration of intravenous contrast. CONTRAST:  68mL MULTIHANCE GADOBENATE DIMEGLUMINE 529 MG/ML IV SOLN COMPARISON:  None. FINDINGS: The hip and knee joints are grossly maintained. No definite findings for septic arthritis or osteomyelitis. There is extensive pyomyositis involving the posterior compartment of the thigh. Multiple large abscesses surrounding the hamstring muscles. This also rim enhancing fluid/ abscess along the entire course of the sciatic/femoral nerve which is markedly inflamed. Diffuse subcutaneous soft tissue swelling/ edema involving both lower extremities, right greater than left consistent with cellulitis. I do not see any significant findings below-the-knee other than cellulitis. No myofasciitis or pyomyositis. No osteomyelitis or septic arthritis. IMPRESSION: 1. Extensive abscesses involving the posterior compartment of the thigh along with myofasciitis and cellulitis. There is also abscess all along the course of the sciatic/femoral nerve which is  thickened and inflamed. 2. No findings for septic arthritis or osteomyelitis. 3. Cellulitis below the knee but no myofasciitis or pyomyositis. These results will be called to the ordering clinician or representative by the Radiologist Assistant, and communication documented in the PACS or zVision Dashboard. Electronically Signed   By: Marijo Sanes M.D.   On: 10/31/2016 08:24   Mr Femur Right W Wo Contrast  Result Date: 10/31/2016 CLINICAL DATA:  Severe right lower extremity pain  and swelling. History of diskitis and osteomyelitis. EXAM: MRI OF THE RIGHT FEMUR WITHOUT AND WITH CONTRAST; MRI OF LOWER RIGHT EXTREMITY WITHOUT AND WITH CONTRAST TECHNIQUE: Multiplanar, multisequence MR imaging of the right lower extremity was performed both before and after administration of intravenous contrast. CONTRAST:  37mL MULTIHANCE GADOBENATE DIMEGLUMINE 529 MG/ML IV SOLN COMPARISON:  None. FINDINGS: The hip and knee joints are grossly maintained. No definite findings for septic arthritis or osteomyelitis. There is extensive pyomyositis involving the posterior compartment of the thigh. Multiple large abscesses surrounding the hamstring muscles. This also rim enhancing fluid/ abscess along the entire course of the sciatic/femoral nerve which is markedly inflamed. Diffuse subcutaneous soft tissue swelling/ edema involving both lower extremities, right greater than left consistent with cellulitis. I do not see any significant findings below-the-knee other than cellulitis. No myofasciitis or pyomyositis. No osteomyelitis or septic arthritis. IMPRESSION: 1. Extensive abscesses involving the posterior compartment of the thigh along with myofasciitis and cellulitis. There is also abscess all along the course of the sciatic/femoral nerve which is thickened and inflamed. 2. No findings for septic arthritis or osteomyelitis. 3. Cellulitis below the knee but no myofasciitis or pyomyositis. These results will be called to the ordering clinician or representative by the Radiologist Assistant, and communication documented in the PACS or zVision Dashboard. Electronically Signed   By: Marijo Sanes M.D.   On: 10/31/2016 08:24   Dg Chest Port 1 View  Result Date: 10/29/2016 CLINICAL DATA:  Preop evaluation prior treatment for epidural abscess and cellulitis. No current chest complaints. Current smoker. EXAM: PORTABLE CHEST 1 VIEW COMPARISON:  None in PACs FINDINGS: Today's radiograph is taken in a lordotic projection.  The lungs are well-expanded. The interstitial markings are coarse. There is no alveolar infiltrate or pleural effusion. The heart is top-normal in size. The pulmonary vascularity is not clearly engorged. The bony thorax exhibits no acute abnormality. IMPRESSION: Bilateral interstitial prominence likely reflects the patient's smoking history. No definite pneumonia nor CHF. Electronically Signed   By: David  Martinique M.D.   On: 10/29/2016 17:05   US Abdomen Limited Ruq  Result Date: 10/29/2016 CLINICAL DATA:  Jaundice.  History of hepatitis-C. EXAM: US ABDOMEN LIMITED - RIGHT UPPER QUADRANT COMPARISON:  None. FINDINGS: Gallbladder: Moderate gallbladder mural thickening, measuring 6 mm. No calculi. No tenderness over the gallbladder. Common bile duct: Diameter: Normal, 4.5 mm. Liver: Diffusely increased echogenicity without focal lesion. IMPRESSION: 1. Diffuse hepatic parenchymal echogenicity suggesting steatosis or other hepatocellular disease. 2. Gallbladder mural thickening without calculi or Murphy's sign. This can be seen with hepatocellular disease or portal hypertension. 3. No bile duct dilatation. 4. No ascites in the right upper quadrant. Electronically Signed   By: Andreas Newport M.D.   On: 10/29/2016 20:30    Assessment / Plan:  1. 36 yo male admitted with spinal abscess. ID following, on antibiotics. Blood cx negative so far.   2. Markedly abnormal liver chemistries in setting of heavy NSAID use, ETOH and also underlying HCV. Started on steroids based on Discriminant function. Currently  on prednisone, will change to prednisolone.  -am LFT, INR -will need eventual HCV treatment. His primary GI can also address HAV, HBV immunizations.   3. Longstanding hx of ETOH abuse with previous hx of ETOH hepatitis. No evidence for cirrhosis by U/S. Platelets and albumin low but in setting of infection so not helpful. No stigmata of cirrhosis on exam.    Principal Problem:   Epidural abscess Active  Problems:   Liver failure (HCC)   Hyponatremia   Cellulitis of right lower extremity   EtOH dependence (HCC)   Thrombocytopenia ([place])   Cirrhosis ([place])   Paraspinal abscess (HCC)   Chronic hepatitis C without hepatic coma ([place])   Hepatitis    LOS: 2 days   Tye Savoy NP 10/31/2016, 12:49 PM  Pager number [phone]      Attending physician's note   I have taken an interval history, reviewed the chart and examined the patient. I agree with the Advanced Practitioner's note, impression and recommendations. Alcoholic hepatitis and chronic HCV. Change to Prednisolone 40 mg daily for 30 days with first dose today. LFTs, t bili and INR all slightly increased today. Continue to trend CMP, PT/INR, CBC daily. He is at increased risk for further hepatic decompensation, morbidity and mortality due to alcoholic hepatic. Surgery will increase the risk of further hepatic decompensation/complications however his infections are very serious and require appropriate management.   Lucio Edward, MD [facility] [phone] Mon-Fri 8a-5p [phone] after 5p, weekends, holidays

## 2016-10-31 NOTE — Significant Event (Signed)
Rapid Response Event Note  Overview: Time Called: 2345 Arrival Time: 2348 Event Type: Other (Comment)  Initial Focused Assessment: Combative-Etoh withdrawl   Interventions: Ativan and higher level of care.  Plan of Care (if not transferred):  Event Summary: Called to assist with care of patient experiencing combative episodes. Patient is fighting with staff and pulling out IV's. Etoh history noted. Patient now has CIWA score of 35. Dr. Maudie Mercury was made aware of patients behavior and Ativan 2mg  IVP was ordered. Patient continues to be agitated and combative. Patient was placed in soft wrist restraints for safety. Dr. Maudie Mercury also evaluated patient as bedside. Security was also called to assist with patient combative behavior.  It was recommended that patient needs a higher level of care due to level of distress.       Eino Farber [place]

## 2016-10-31 NOTE — Consult Note (Signed)
PCCM Consult Note  Admission date: 10/29/2016 Consult date: 10/31/2016 Referring provider: Dr. Sherral Hammers  CC: Delirium  HPI: 36 yo male developed swelling in Rt leg and foot, and back pain.  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  He was seen by orthopedics, and started on IV abx.  He has hx of Hepatitis C and ETOH.  He was noted to have jaundice and was seen by GI.  He was started on prednisone for alcoholic hepatitis.  He was also seen by ID.  He developed progressive altered mental status due to delirium tremens.  He was transferred to ICU.  Hx from chart, medical staff, and family.  He  has a past medical history of Hepatitis C.  He  has a past surgical history that includes Hernia repair.  He is not able to provide family history.  He  reports that he has been smoking.  He has never used smokeless tobacco. He reports that he drinks alcohol. He reports that he does not use drugs.  No Known Allergies   No current facility-administered medications on file prior to encounter.    Current Outpatient Prescriptions on File Prior to Encounter  Medication Sig  . cyclobenzaprine (FLEXERIL) 10 MG tablet Take 1 tablet (10 mg total) by mouth 2 (two) times daily as needed for muscle spasms.  Marland Kitchen HYDROcodone-acetaminophen (NORCO/VICODIN) 5-325 MG tablet Take 2 tablets by mouth every 4 (four) hours as needed.  . naproxen (NAPROSYN) 500 MG tablet Take 1 tablet (500 mg total) by mouth 2 (two) times daily.  . promethazine (PHENERGAN) 25 MG tablet Take 25 mg by mouth every 6 (six) hours as needed for nausea or vomiting.  . diclofenac sodium (VOLTAREN) 1 % GEL Apply 4 g topically 4 (four) times daily. (Patient not taking: Reported on 10/29/2016)  . predniSONE (DELTASONE) 20 MG tablet Take 2 tablets (40 mg total) by mouth daily. (Patient not taking: Reported on 10/29/2016)    ROS: Unable to obtain  Vital signs: BP (!) 146/88 (BP  Location: Right Arm)   Pulse (!) 103   Temp 98.6 F (37 C) (Oral)   Resp (!) 28   Ht 5\' 11"  (1.803 m)   Wt 266 lb 1.6 oz (120.7 kg)   SpO2 96%   BMI 37.11 kg/m   Intake/output: I/O last 3 completed shifts: In: 120 [P.O.:120] Out: 1200 [Urine:1200]  General: diaphoretic Neuro: agitated, confused Eyes: Jaundiced ENT: no stridor Cardiac: tachycardic, no murmur Chest: no wheeze Abd: soft, decreased BS Ext: Rt leg warm, swollen Skin: redness over Rt leg   CMP Latest Ref Rng & Units 10/31/2016 10/30/2016 10/29/2016  Glucose 65 - 99 mg/dL 80 89 168(H)  BUN 6 - 20 mg/dL 7 9 17   Creatinine 0.61 - 1.24 mg/dL 0.73 0.67 0.67  Sodium 135 - 145 mmol/L 133(L) 134(L) 132(L)  Potassium 3.5 - 5.1 mmol/L 3.6 3.7 3.7  Chloride 101 - 111 mmol/L 102 103 100(L)  CO2 22 - 32 mmol/L 23 21(L) 21(L)  Calcium 8.9 - 10.3 mg/dL 7.7(L) 7.4(L) 7.7(L)  Total Protein 6.5 - 8.1 g/dL 6.4(L) 6.0(L) 6.3(L)  Total Bilirubin 0.3 - 1.2 mg/dL 16.0(H) 14.1(H) 14.0(H)  Alkaline Phos 38 - 126 U/L 212(H) 189(H) 222(H)  AST 15 - 41 U/L 268(H) 227(H) 190(H)  ALT 17 - 63 U/L 78(H) 69(H) 64(H)     CBC Latest Ref Rng & Units 10/31/2016 10/30/2016 10/29/2016  WBC 4.0 - 10.5 K/uL  17.6(H) 15.5(H) 19.3(H)  Hemoglobin 13.0 - 17.0 g/dL 11.8(L) 10.8(L) 11.4(L)  Hematocrit 39.0 - 52.0 % 34.4(L) 31.7(L) 32.2(L)  Platelets 150 - 400 K/uL 57(L) 51(L) 34(L)    Lab Results  Component Value Date   INR 2.16 10/31/2016   INR 2.04 10/30/2016   INR 1.96 10/29/2016    ABG No results found for: PHART, PCO2ART, PO2ART, HCO3, TCO2, ACIDBASEDEF, O2SAT   CBG (last 3)  No results for input(s): GLUCAP in the last 72 hours.   Imaging: Mr Tibia Fibula Right W Wo Contrast  Result Date: 10/31/2016 CLINICAL DATA:  Severe right lower extremity pain and swelling. History of diskitis and osteomyelitis. EXAM: MRI OF THE RIGHT FEMUR WITHOUT AND WITH CONTRAST; MRI OF LOWER RIGHT EXTREMITY WITHOUT AND WITH CONTRAST TECHNIQUE: Multiplanar,  multisequence MR imaging of the right lower extremity was performed both before and after administration of intravenous contrast. CONTRAST:  72mL MULTIHANCE GADOBENATE DIMEGLUMINE 529 MG/ML IV SOLN COMPARISON:  None. FINDINGS: The hip and knee joints are grossly maintained. No definite findings for septic arthritis or osteomyelitis. There is extensive pyomyositis involving the posterior compartment of the thigh. Multiple large abscesses surrounding the hamstring muscles. This also rim enhancing fluid/ abscess along the entire course of the sciatic/femoral nerve which is markedly inflamed. Diffuse subcutaneous soft tissue swelling/ edema involving both lower extremities, right greater than left consistent with cellulitis. I do not see any significant findings below-the-knee other than cellulitis. No myofasciitis or pyomyositis. No osteomyelitis or septic arthritis. IMPRESSION: 1. Extensive abscesses involving the posterior compartment of the thigh along with myofasciitis and cellulitis. There is also abscess all along the course of the sciatic/femoral nerve which is thickened and inflamed. 2. No findings for septic arthritis or osteomyelitis. 3. Cellulitis below the knee but no myofasciitis or pyomyositis. These results will be called to the ordering clinician or representative by the Radiologist Assistant, and communication documented in the PACS or zVision Dashboard. Electronically Signed   By: Marijo Sanes M.D.   On: 10/31/2016 08:24   Mr Femur Right W Wo Contrast  Result Date: 10/31/2016 CLINICAL DATA:  Severe right lower extremity pain and swelling. History of diskitis and osteomyelitis. EXAM: MRI OF THE RIGHT FEMUR WITHOUT AND WITH CONTRAST; MRI OF LOWER RIGHT EXTREMITY WITHOUT AND WITH CONTRAST TECHNIQUE: Multiplanar, multisequence MR imaging of the right lower extremity was performed both before and after administration of intravenous contrast. CONTRAST:  70mL MULTIHANCE GADOBENATE DIMEGLUMINE 529 MG/ML  IV SOLN COMPARISON:  None. FINDINGS: The hip and knee joints are grossly maintained. No definite findings for septic arthritis or osteomyelitis. There is extensive pyomyositis involving the posterior compartment of the thigh. Multiple large abscesses surrounding the hamstring muscles. This also rim enhancing fluid/ abscess along the entire course of the sciatic/femoral nerve which is markedly inflamed. Diffuse subcutaneous soft tissue swelling/ edema involving both lower extremities, right greater than left consistent with cellulitis. I do not see any significant findings below-the-knee other than cellulitis. No myofasciitis or pyomyositis. No osteomyelitis or septic arthritis. IMPRESSION: 1. Extensive abscesses involving the posterior compartment of the thigh along with myofasciitis and cellulitis. There is also abscess all along the course of the sciatic/femoral nerve which is thickened and inflamed. 2. No findings for septic arthritis or osteomyelitis. 3. Cellulitis below the knee but no myofasciitis or pyomyositis. These results will be called to the ordering clinician or representative by the Radiologist Assistant, and communication documented in the PACS or zVision Dashboard. Electronically Signed   By: Ricky Stabs.D.  On: 10/31/2016 08:24     Studies: RUQ u/s 2/01 >> diffuse hepatic echogenicity MR Rt leg 2/02 >> abscess posterior compartment of thigh, along course of sciatic/femoral nerve, cellulitis below the knee  Antibiotics: Zosyn 2/01 >> 2/01 Vancomycin 2/01 >> Cefepime 2/02 >>  Cultures: Blood 2/01 >>  Lines/tubes:  Events: 2/01 Admit 2/03 Transfer to ICU, start precedex  Summary: 36 yo with L4-5 septic arthritis, paraspinal abscess, Rt thigh abscess, and Rt lower leg cellulitis.  He has hx of ETOH with acute alcoholic hepatitis, and hepatitis C.  He developed altered mental status from delirium tremens.  Assessment/plan:  Delirium tremens. - precedex - RASS goal 0 to  -1 - thiamine, folic acid - prn ativan  Acute alcoholic hepatitis. Hx of Hepatitis C. - change to solumedrol while NPO - f/u LFTs - GI following  L4-5 septic arthritis, paraspinal abscess, Rt thigh abscess, Rt lower leg cellulitis. - plan for surgery 2/04 - Abx per ID  Coagulopathy, thrombocytopenia 2nd to liver disease. - f/u CBC, INR  DVT prophylaxis - SCDs SUP - Protonix Nutrition - NPO Goals of care - full code  Updated pt's wife, and brother at bedside.  CC time 38 minutes  [name], MD [facility] 10/31/2016, 11:46 PM Pager:  [phone] After 3pm call: [phone]

## 2016-10-31 NOTE — Progress Notes (Signed)
Discussed case with patient and his family Given the progression in his pain and the new MRI findings it is my feeling that we need to move forward with surgical debridement of the spine and leg  Abscess.  Alternative is iv ABX and brace. Surgical plan:   1.right sided gill decompression - this will remove the entire infected right L4/5 facet and pars.  This will allow for right lateral recess decompression as well as L4 and L5 nerve decompression.  This will also allow for direct irrigation of the ventral abscess.  2. Dr Veverly Fells will debride the right posterior thigh abscess as well provided he remains stable.  Discussed issue with anesthesia and medical team. Patient aware that this is a high risk procedure given his medical condition.  However without treatment he will most likely continue to deteriorate.  Explained risks: death, stroke, paralysis, ongoing or worse back pain, need for additional surgery including fusion, leak of spinal fluid. Medical team to optimize patient tonight - FFP and plts. All questions addressed. Plan on surgery Sunday (9/930 AM)

## 2016-10-31 NOTE — Progress Notes (Addendum)
PROGRESS NOTE    [name]  [id number] DOB: 06-13-1981 DOA: 10/29/2016 PCP: Nicholes Rough, PA-C     Brief Narrative:  35 y.o.WM PMHx Hepatitis C, Liver Cirrhosis per wife, last records LFT AST 74, AST 163, Alkaline phosphatase 119, Bili 1.2, EtOH abuse still drinks occasional alcohol, not significant amount per wife, he presents complaining of worsening back pain and right lower extremity edema and redness. He report having back pain since January 19. He was seeing in the ED and discharge on pain regimen. He went to see Dr Suella Broad with ortho and underwent MRI which showed L 4 -5 right facet joint septic arthritis, and soft tissue abscess within the adjacent posterior Paraspinous musculature 1.7* 1.3* 4.6.  He report pain as sharp, shooting pain, radiates to right LE. Is difficult for him to walk due to pain and swelling right LE.  He denies chest pain or dyspnea. No BM in 3 days. His wife just notice that he became jaundice 1 day prior to admission. Also wife reported some confusion early on the day of admission.      Subjective: 2/3  A/O 4. States pain in RLE 10/10. Understands that he will have surgery on his back on Monday for abscess.     Assessment & Plan:   Principal Problem:   Epidural abscess Active Problems:   Liver failure (HCC)   Hyponatremia   Cellulitis of right lower extremity   EtOH dependence (HCC)   Thrombocytopenia (HCC)   Cirrhosis (HCC)   Paraspinal abscess (HCC)   Chronic hepatitis C without hepatic coma (HCC)   Hepatitis   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 Orthopedics would like to operate on him but a concern about rest of anesthesia. Patient has been seen by gastroenterology will defer operative risk in the context of his liver disease to gastroenterology. Per GI note patient's meld score is 24 with a discriminant function = 66 and although patient is at increased risk for  hepatic decompensation, morbidity or mortality per GI risk will not likely changed for the next one to 2 months. Blood cultures pending. Patient admission in consultation by the were recommending continued IV vancomycin and narrowing IV Zosyn to IV cefepime. Appreciate ID input and recommendations. Per orthopedics.  RLE Posterior Compartment Abscesses/Abscess Sciatic/Femoral nerve/ right lower extremity cellulitis -Spoke with Dr. Matt:[facility], who review case and discuss with Dr. Rolena Infante. Decision on how we will treat RLE abscess to be determined. Patient and wife aware of MRI findings and discussion to take place -Continue current antibiotic regimen.  -ADDENDUM: Spoke with Dr Duane Lope D. [facility]. Orthopedic and Spinal surgery who talked  with family today at 67. Surgery scheduled for 2/4 -2/3 transfuse 4 units FFP -2/3 vitamin K IV 10 mg -2/3 ordered 2 units platelets on call for surgery Have already briefly spoken with mother and explained any surgery would be extremely high risk, and requested that her and his wife in any other family member be present at 42 to speak with surgeons concerning possible surgery.  Acute Hepatic failure -Bilirubin continues to trend up.  -INR trending up Recent Labs Lab 10/29/16 1630 10/30/16 0514 10/31/16 0416  INR 1.96 2.04 2.16  -if all surgeons agree to perform surgery and patient/family agrees to accept risk patient will require FFP, vitamin K, platelets in order to prepare for surgery. -Per gastroenterology likely secondary to EtOH hepatitis in the setting of underlying Hepatitis C. Meld score is  24 with a discriminant function of 66.  -Patient has been started on prednisone 40 mg daily per GI recommendations.   Positive Hepatitis C -Most likely also contributing to patient's liver failure unfortunately -Treatment if any per ID  ETOH dependence -Continue CIWA protocol.  -Thiamine, folic acid,  multivitamin.  Thrombocytopenia -Likely secondary to ETOH Abuse. Patient with no overt bleeding. Platelet function trending back up. Follow.  Hyponatremia -Slowly improving.     DVT prophylaxis: None: Hypercoagulable Code Status: Full Family Communication: Wife present for discussion of plan of care Disposition Plan: ??   Consultants:  Dr Duane Lope D. [facility]. [facility]opedic and Spinal surgery ID GI   Procedures/Significant Events:  Outside L-spine MRI : showed L 4 -5 right facet joint septic arthritis, and soft tissue abscess within the adjacent posterior Paraspinous musculature 2/3 MRI Right femur/RLE W/WO contrast:-Extensive abscesses involving the posterior compartment of the thigh along with myofasciitis and cellulitis.  -Abscess all along the course of the sciatic/femoral nerve which is thickened and inflamed -Negative Septic arthritis or osteomyelitis. -Cellulitis below the knee but no myofasciitis or pyomyositis. --2/3 transfuse 4 units FFP    VENTILATOR SETTINGS:    Cultures 2/1 blood 2 NGTD 2/1 MRSA by PCR negative 2/1 Hepatitis C positive     Antimicrobials: Anti-infectives    Start     Stop   10/30/16 1000  ceFEPIme (MAXIPIME) 2 g in dextrose 5 % 50 mL IVPB         10/29/16 2200  vancomycin (VANCOCIN) IVPB 1000 mg/200 mL premix         10/29/16 2200  piperacillin-tazobactam (ZOSYN) IVPB 3.375 g  Status:  Discontinued     10/29/16 1430   10/29/16 1300  vancomycin (VANCOCIN) IVPB 1000 mg/200 mL premix     10/29/16 1519   10/29/16 1300  piperacillin-tazobactam (ZOSYN) IVPB 3.375 g     10/29/16 1340       Devices   LINES / TUBES:      Continuous Infusions: . sodium chloride 75 mL/hr at 10/30/16 0636     Objective: Vitals:   10/30/16 2013 10/31/16 0114 10/31/16 0315 10/31/16 0825  BP: 138/81 (!) 145/87 (!) 156/87 (!) 143/83  Pulse: (!) 102 99 96 (!) 109  Resp: (!) 27 20 17  (!) 24  Temp: 98.7 F (37.1 C)  98.3 F (36.8 C) 98 F  (36.7 C)  TempSrc: Oral  Oral Oral  SpO2: 95% 97% 96% 100%  Weight:   120.7 kg (266 lb 1.6 oz)   Height:        Intake/Output Summary (Last 24 hours) at 10/31/16 0843 Last data filed at 10/31/16 [id number]  Gross per 24 hour  Intake              120 ml  Output              400 ml  Net             -280 ml   Filed Weights   10/29/16 2125 10/30/16 0351 10/31/16 0315  Weight: 120 kg (264 lb 8 oz) 119.4 kg (263 lb 4.8 oz) 120.7 kg (266 lb 1.6 oz)    Examination:  General: A/O 4, positive acute RLE pain, No acute respiratory distress Eyes: negative scleral hemorrhage, negative anisocoria, negative icterus ENT: Negative Runny nose, negative gingival bleeding, Neck:  Negative scars, masses, torticollis, lymphadenopathy, JVD Lungs: Clear to auscultation bilaterally without wheezes or crackles Cardiovascular: Regular rate and rhythm without murmur gallop or rub normal S1  and S2 Abdomen: negative abdominal pain, nondistended, positive soft, bowel sounds, no rebound, no ascites, no appreciable mass Extremities: RLE 5 swelling, pain to palpation, erythema. Bilateral upper extremity tremors Skin: Negative rashes, lesions, ulcers Psychiatric:  Negative depression, negative anxiety, negative fatigue, negative mania  Central nervous system:  Cranial nerves II through XII intact, tongue/uvula midline, all extremities muscle strength 5/5, sensation intact throughout, negative dysarthria, negative expressive aphasia, negative receptive aphasia.  .     Data Reviewed: Care during the described time interval was provided by me .  I have reviewed this patient's available data, including medical history, events of note, physical examination, and all test results as part of my evaluation. I have personally reviewed and interpreted all radiology studies.  CBC:  Recent Labs Lab 10/29/16 1214 10/30/16 0514 10/31/16 0416  WBC 19.3* 15.5* 17.6*  NEUTROABS 15.1*  --  10.5*  HGB 11.4* 10.8* 11.8*  HCT  32.2* 31.7* 34.4*  MCV 94.2 94.6 95.0  PLT 34* 51* 57*   Basic Metabolic Panel:  Recent Labs Lab 10/29/16 1214 10/30/16 0514 10/30/16 0927 10/31/16 0416  NA 132* 134*  --  133*  K 3.7 3.7  --  3.6  CL 100* 103  --  102  CO2 21* 21*  --  23  GLUCOSE 168* 89  --  80  BUN 17 9  --  7  CREATININE 0.67 0.67  --  0.73  CALCIUM 7.7* 7.4*  --  7.7*  MG  --   --  1.7 2.1   GFR: Estimated Creatinine Clearance: 170.4 mL/min (by C-G formula based on SCr of 0.73 mg/dL). Liver Function Tests:  Recent Labs Lab 10/29/16 1214 10/30/16 0514 10/31/16 0416  AST 190* 227* 268*  ALT 64* 69* 78*  ALKPHOS 222* 189* 212*  BILITOT 14.0* 14.1* 16.0*  PROT 6.3* 6.0* 6.4*  ALBUMIN 2.1* 1.8* 1.8*   No results for input(s): LIPASE, AMYLASE in the last 168 hours. No results for input(s): AMMONIA in the last 168 hours. Coagulation Profile:  Recent Labs Lab 10/29/16 1630 10/30/16 0514 10/31/16 0416  INR 1.96 2.04 2.16   Cardiac Enzymes: No results for input(s): CKTOTAL, CKMB, CKMBINDEX, TROPONINI in the last 168 hours. BNP (last 3 results) No results for input(s): PROBNP in the last 8760 hours. HbA1C: No results for input(s): HGBA1C in the last 72 hours. CBG: No results for input(s): GLUCAP in the last 168 hours. Lipid Profile: No results for input(s): CHOL, HDL, LDLCALC, TRIG, CHOLHDL, LDLDIRECT in the last 72 hours. Thyroid Function Tests: No results for input(s): TSH, T4TOTAL, FREET4, T3FREE, THYROIDAB in the last 72 hours. Anemia Panel: No results for input(s): VITAMINB12, FOLATE, FERRITIN, TIBC, IRON, RETICCTPCT in the last 72 hours. Sepsis Labs:  Recent Labs Lab 10/29/16 1630 10/29/16 1643  LATICACIDVEN 1.6 1.98*    Recent Results (from the past 240 hour(s))  Culture, blood (routine x 2)     Status: None (Preliminary result)   Collection Time: 10/29/16 12:52 PM  Result Value Ref Range Status   Specimen Description BLOOD RIGHT ANTECUBITAL  Final   Special Requests  BOTTLES DRAWN AEROBIC AND ANAEROBIC 5 ML  Final   Culture   Final    NO GROWTH < 24 HOURS Performed at [facility], [place] [address], [place], [place] [address]    Report Status PENDING  Incomplete  Culture, blood (routine x 2)     Status: None (Preliminary result)   Collection Time: 10/29/16 12:56 PM  Result Value  Ref Range Status   Specimen Description BLOOD LEFT HAND  Final   Special Requests IN PEDIATRIC BOTTLE 2 ML  Final   Culture   Final    NO GROWTH < 24 HOURS Performed at [facility], [place] [address] [place], [place] [address]    Report Status PENDING  Incomplete  MRSA PCR Screening     Status: None   Collection Time: 10/29/16  9:30 PM  Result Value Ref Range Status   MRSA by PCR NEGATIVE NEGATIVE Final    Comment:        The GeneXpert MRSA Assay (FDA approved for NASAL specimens only), is one component of a comprehensive MRSA colonization surveillance program. It is not intended to diagnose MRSA infection nor to guide or monitor treatment for MRSA infections.          Radiology Studies: Mr Tibia Fibula Right W Wo Contrast  Result Date: 10/31/2016 CLINICAL DATA:  Severe right lower extremity pain and swelling. History of diskitis and osteomyelitis. EXAM: MRI OF THE RIGHT FEMUR WITHOUT AND WITH CONTRAST; MRI OF LOWER RIGHT EXTREMITY WITHOUT AND WITH CONTRAST TECHNIQUE: Multiplanar, multisequence MR imaging of the right lower extremity was performed both before and after administration of intravenous contrast. CONTRAST:  52mL MULTIHANCE GADOBENATE DIMEGLUMINE 529 MG/ML IV SOLN COMPARISON:  None. FINDINGS: The hip and knee joints are grossly maintained. No definite findings for septic arthritis or osteomyelitis. There is extensive pyomyositis involving the posterior compartment of the thigh. Multiple large abscesses surrounding the hamstring muscles. This also rim enhancing fluid/ abscess along the entire course of the sciatic/femoral nerve which is markedly  inflamed. Diffuse subcutaneous soft tissue swelling/ edema involving both lower extremities, right greater than left consistent with cellulitis. I do not see any significant findings below-the-knee other than cellulitis. No myofasciitis or pyomyositis. No osteomyelitis or septic arthritis. IMPRESSION: 1. Extensive abscesses involving the posterior compartment of the thigh along with myofasciitis and cellulitis. There is also abscess all along the course of the sciatic/femoral nerve which is thickened and inflamed. 2. No findings for septic arthritis or osteomyelitis. 3. Cellulitis below the knee but no myofasciitis or pyomyositis. These results will be called to the ordering clinician or representative by the Radiologist Assistant, and communication documented in the PACS or zVision Dashboard. Electronically Signed   By: Marijo Sanes M.D.   On: 10/31/2016 08:24   Mr Femur Right W Wo Contrast  Result Date: 10/31/2016 CLINICAL DATA:  Severe right lower extremity pain and swelling. History of diskitis and osteomyelitis. EXAM: MRI OF THE RIGHT FEMUR WITHOUT AND WITH CONTRAST; MRI OF LOWER RIGHT EXTREMITY WITHOUT AND WITH CONTRAST TECHNIQUE: Multiplanar, multisequence MR imaging of the right lower extremity was performed both before and after administration of intravenous contrast. CONTRAST:  31mL MULTIHANCE GADOBENATE DIMEGLUMINE 529 MG/ML IV SOLN COMPARISON:  None. FINDINGS: The hip and knee joints are grossly maintained. No definite findings for septic arthritis or osteomyelitis. There is extensive pyomyositis involving the posterior compartment of the thigh. Multiple large abscesses surrounding the hamstring muscles. This also rim enhancing fluid/ abscess along the entire course of the sciatic/femoral nerve which is markedly inflamed. Diffuse subcutaneous soft tissue swelling/ edema involving both lower extremities, right greater than left consistent with cellulitis. I do not see any significant findings  below-the-knee other than cellulitis. No myofasciitis or pyomyositis. No osteomyelitis or septic arthritis. IMPRESSION: 1. Extensive abscesses involving the posterior compartment of the thigh along with myofasciitis and cellulitis. There is also abscess all along the  course of the sciatic/femoral nerve which is thickened and inflamed. 2. No findings for septic arthritis or osteomyelitis. 3. Cellulitis below the knee but no myofasciitis or pyomyositis. These results will be called to the ordering clinician or representative by the Radiologist Assistant, and communication documented in the PACS or zVision Dashboard. Electronically Signed   By: Marijo Sanes M.D.   On: 10/31/2016 08:24   Dg Chest Port 1 View  Result Date: 10/29/2016 CLINICAL DATA:  Preop evaluation prior treatment for epidural abscess and cellulitis. No current chest complaints. Current smoker. EXAM: PORTABLE CHEST 1 VIEW COMPARISON:  None in PACs FINDINGS: Today's radiograph is taken in a lordotic projection. The lungs are well-expanded. The interstitial markings are coarse. There is no alveolar infiltrate or pleural effusion. The heart is top-normal in size. The pulmonary vascularity is not clearly engorged. The bony thorax exhibits no acute abnormality. IMPRESSION: Bilateral interstitial prominence likely reflects the patient's smoking history. No definite pneumonia nor CHF. Electronically Signed   By: David  Martinique M.D.   On: 10/29/2016 17:05   US Abdomen Limited Ruq  Result Date: 10/29/2016 CLINICAL DATA:  Jaundice.  History of hepatitis-C. EXAM: US ABDOMEN LIMITED - RIGHT UPPER QUADRANT COMPARISON:  None. FINDINGS: Gallbladder: Moderate gallbladder mural thickening, measuring 6 mm. No calculi. No tenderness over the gallbladder. Common bile duct: Diameter: Normal, 4.5 mm. Liver: Diffusely increased echogenicity without focal lesion. IMPRESSION: 1. Diffuse hepatic parenchymal echogenicity suggesting steatosis or other hepatocellular disease.  2. Gallbladder mural thickening without calculi or Murphy's sign. This can be seen with hepatocellular disease or portal hypertension. 3. No bile duct dilatation. 4. No ascites in the right upper quadrant. Electronically Signed   By: Andreas Newport M.D.   On: 10/29/2016 20:30        Scheduled Meds: . ceFEPime (MAXIPIME) IV  2 g Intravenous Q12H  . folic acid  1 mg Oral Daily  . LORazepam  0-4 mg Intravenous Q6H   Followed by  . [START ON 11/01/2016] LORazepam  0-4 mg Intravenous Q12H  . multivitamin with minerals  1 tablet Oral Daily  . predniSONE  40 mg Oral Q breakfast  . thiamine  100 mg Oral Daily   Or  . thiamine  100 mg Intravenous Daily  . vancomycin  1,000 mg Intravenous Q8H   Continuous Infusions: . sodium chloride 75 mL/hr at 10/30/16 0636     LOS: 2 days    Time spent:90 min    Elieser Tetrick, Geraldo Docker, MD [facility] Pager [phone]  If 7PM-7AM, please contact night-coverage [email or web address] Password [facility] 10/31/2016, 8:43 AM

## 2016-11-01 ENCOUNTER — Inpatient Hospital Stay (HOSPITAL_COMMUNITY): Payer: BLUE CROSS/BLUE SHIELD | Admitting: Certified Registered Nurse Anesthetist

## 2016-11-01 ENCOUNTER — Inpatient Hospital Stay (HOSPITAL_COMMUNITY): Payer: BLUE CROSS/BLUE SHIELD

## 2016-11-01 ENCOUNTER — Encounter (HOSPITAL_COMMUNITY)
Admission: EM | Disposition: A | Discharge status: Rehabilitation Facility | Payer: Self-pay | Source: Home / Self Care | Attending: Internal Medicine

## 2016-11-01 DIAGNOSIS — G062 Extradural and subdural abscess, unspecified: Secondary | ICD-10-CM

## 2016-11-01 DIAGNOSIS — M4626 Osteomyelitis of vertebra, lumbar region: Secondary | ICD-10-CM | POA: Diagnosis present

## 2016-11-01 DIAGNOSIS — M869 Osteomyelitis, unspecified: Secondary | ICD-10-CM

## 2016-11-01 DIAGNOSIS — F10231 Alcohol dependence with withdrawal delirium: Secondary | ICD-10-CM

## 2016-11-01 DIAGNOSIS — F10931 Alcohol use, unspecified with withdrawal delirium: Secondary | ICD-10-CM

## 2016-11-01 HISTORY — PX: I&D EXTREMITY: SHX5045

## 2016-11-01 HISTORY — PX: DECOMPRESSIVE LUMBAR LAMINECTOMY LEVEL 1: SHX5791

## 2016-11-01 LAB — BLOOD GAS, ARTERIAL
ACID-BASE DEFICIT: 1.5 mmol/L (ref 0.0–2.0)
BICARBONATE: 23.1 mmol/L (ref 20.0–28.0)
Drawn by: 330991
FIO2: 40
LHR: 20 {breaths}/min
O2 Saturation: 89.7 %
PEEP/CPAP: 5 cmH2O
Patient temperature: 98.6
VT: 600 mL
pCO2 arterial: 41.7 mmHg (ref 32.0–48.0)
pH, Arterial: 7.362 (ref 7.350–7.450)
pO2, Arterial: 65.6 mmHg — ABNORMAL LOW (ref 83.0–108.0)

## 2016-11-01 LAB — COMPREHENSIVE METABOLIC PANEL
ALBUMIN: 1.6 g/dL — AB (ref 3.5–5.0)
ALT: 91 U/L — ABNORMAL HIGH (ref 17–63)
AST: 263 U/L — AB (ref 15–41)
Alkaline Phosphatase: 166 U/L — ABNORMAL HIGH (ref 38–126)
Anion gap: 3 — ABNORMAL LOW (ref 5–15)
BILIRUBIN TOTAL: 15.2 mg/dL — AB (ref 0.3–1.2)
BUN: 11 mg/dL (ref 6–20)
CHLORIDE: 104 mmol/L (ref 101–111)
CO2: 25 mmol/L (ref 22–32)
Calcium: 7.7 mg/dL — ABNORMAL LOW (ref 8.9–10.3)
Creatinine, Ser: 0.67 mg/dL (ref 0.61–1.24)
GFR calc Af Amer: >60 mL/min (ref >60)
GFR calc non Af Amer: >60 mL/min (ref >60)
GLUCOSE: 150 mg/dL — AB (ref 65–99)
POTASSIUM: 4.5 mmol/L (ref 3.5–5.1)
Sodium: 132 mmol/L — ABNORMAL LOW (ref 135–145)
TOTAL PROTEIN: 6.1 g/dL — AB (ref 6.5–8.1)

## 2016-11-01 LAB — CBC WITH DIFFERENTIAL/PLATELET
BASOS ABS: 0 10*3/uL (ref 0.0–0.1)
BASOS PCT: 0 %
EOS ABS: 0 10*3/uL (ref 0.0–0.7)
Eosinophils Relative: 0 %
HCT: 25.3 % — ABNORMAL LOW (ref 39.0–52.0)
HEMOGLOBIN: 8.7 g/dL — AB (ref 13.0–17.0)
Lymphocytes Relative: 14 %
Lymphs Abs: 1.7 10*3/uL (ref 0.7–4.0)
MCH: 33.3 pg (ref 26.0–34.0)
MCHC: 34.4 g/dL (ref 30.0–36.0)
MCV: 96.9 fL (ref 78.0–100.0)
Monocytes Absolute: 2.1 10*3/uL — ABNORMAL HIGH (ref 0.1–1.0)
Monocytes Relative: 17 %
NEUTROS PCT: 69 %
Neutro Abs: 8.4 10*3/uL — ABNORMAL HIGH (ref 1.7–7.7)
Platelets: 83 10*3/uL — ABNORMAL LOW (ref 150–400)
RBC: 2.61 MIL/uL — AB (ref 4.22–5.81)
RDW: 17.6 % — ABNORMAL HIGH (ref 11.5–15.5)
WBC: 12.1 10*3/uL — AB (ref 4.0–10.5)

## 2016-11-01 LAB — PROTIME-INR
INR: 2.3
INR: 1.81
INR: 1.88
PROTHROMBIN TIME: 21.2 s — AB (ref 11.4–15.2)
PROTHROMBIN TIME: 21.8 s — AB (ref 11.4–15.2)
Prothrombin Time: 25.7 seconds — ABNORMAL HIGH (ref 11.4–15.2)

## 2016-11-01 LAB — ABO/RH: ABO/RH(D): A POS

## 2016-11-01 LAB — GLUCOSE, CAPILLARY
GLUCOSE-CAPILLARY: 126 mg/dL — AB (ref 65–99)
Glucose-Capillary: 126 mg/dL — ABNORMAL HIGH (ref 65–99)

## 2016-11-01 LAB — LACTIC ACID, PLASMA: LACTIC ACID, VENOUS: 0.9 mmol/L (ref 0.5–1.9)

## 2016-11-01 LAB — TRIGLYCERIDES: Triglycerides: 136 mg/dL (ref <150)

## 2016-11-01 LAB — AMMONIA: Ammonia: 63 umol/L — ABNORMAL HIGH (ref 9–35)

## 2016-11-01 LAB — MAGNESIUM: Magnesium: 2 mg/dL (ref 1.7–2.4)

## 2016-11-01 SURGERY — DECOMPRESSIVE LUMBAR LAMINECTOMY LEVEL 1
Anesthesia: General | Site: Thigh | Laterality: Right

## 2016-11-01 MED ORDER — CHLORHEXIDINE GLUCONATE CLOTH 2 % EX PADS
6.0000 | MEDICATED_PAD | Freq: Every day | CUTANEOUS | Status: DC
  Administered 2016-11-01 – 2016-11-07 (×7): 6 via TOPICAL

## 2016-11-01 MED ORDER — SODIUM CHLORIDE 0.9 % IR SOLN
Status: DC | PRN
  Administered 2016-11-01: 1000 mL
  Administered 2016-11-01: 6000 mL

## 2016-11-01 MED ORDER — ORAL CARE MOUTH RINSE
15.0000 mL | Freq: Four times a day (QID) | OROMUCOSAL | Status: DC
  Administered 2016-11-01 – 2016-11-04 (×11): 15 mL via OROMUCOSAL

## 2016-11-01 MED ORDER — FOLIC ACID 1 MG PO TABS
1.0000 mg | ORAL_TABLET | Freq: Every day | ORAL | Status: DC
  Filled 2016-11-01: qty 1

## 2016-11-01 MED ORDER — MIDAZOLAM HCL 2 MG/2ML IJ SOLN: 2.0000 mg | INTRAMUSCULAR | Status: DC | PRN

## 2016-11-01 MED ORDER — ONDANSETRON HCL 4 MG/2ML IJ SOLN
INTRAMUSCULAR | Status: DC | PRN
  Administered 2016-11-01: 4 mg via INTRAVENOUS

## 2016-11-01 MED ORDER — SODIUM CHLORIDE 0.9 % IV SOLN
25.0000 ug/h | INTRAVENOUS | Status: DC
  Administered 2016-11-01: 200 ug/h via INTRAVENOUS
  Administered 2016-11-02: 350 ug/h via INTRAVENOUS
  Administered 2016-11-02: 400 ug/h via INTRAVENOUS
  Administered 2016-11-03: 300 ug/h via INTRAVENOUS
  Administered 2016-11-03 – 2016-11-04 (×3): 400 ug/h via INTRAVENOUS
  Filled 2016-11-01 (×8): qty 50

## 2016-11-01 MED ORDER — SODIUM CHLORIDE 0.9 % IV SOLN
INTRAVENOUS | Status: DC | PRN
  Administered 2016-11-01: 100 ug/h via INTRAVENOUS

## 2016-11-01 MED ORDER — FENTANYL CITRATE (PF) 100 MCG/2ML IJ SOLN
INTRAMUSCULAR | Status: AC
  Filled 2016-11-01: qty 4

## 2016-11-01 MED ORDER — SODIUM CHLORIDE 0.9 % IV SOLN: Freq: Once | INTRAVENOUS | Status: DC

## 2016-11-01 MED ORDER — SODIUM CHLORIDE 0.9 % IV SOLN: 250.0000 mL | INTRAVENOUS | Status: DC

## 2016-11-01 MED ORDER — MIDAZOLAM HCL 2 MG/2ML IJ SOLN
INTRAMUSCULAR | Status: AC
  Filled 2016-11-01: qty 2

## 2016-11-01 MED ORDER — SODIUM CHLORIDE 0.9% FLUSH: 10.0000 mL | INTRAVENOUS | Status: DC | PRN

## 2016-11-01 MED ORDER — ADULT MULTIVITAMIN W/MINERALS CH
1.0000 | ORAL_TABLET | Freq: Every day | ORAL | Status: DC
  Administered 2016-11-02 – 2016-11-03 (×2): 1
  Filled 2016-11-01 (×3): qty 1

## 2016-11-01 MED ORDER — 0.9 % SODIUM CHLORIDE (POUR BTL) OPTIME
TOPICAL | Status: DC | PRN
  Administered 2016-11-01: 1000 mL

## 2016-11-01 MED ORDER — MIDAZOLAM HCL 2 MG/2ML IJ SOLN
5.0000 mg | Freq: Once | INTRAMUSCULAR | Status: AC
  Administered 2016-11-01: 5 mg via INTRAVENOUS

## 2016-11-01 MED ORDER — CHLORHEXIDINE GLUCONATE 0.12% ORAL RINSE (MEDLINE KIT)
15.0000 mL | Freq: Two times a day (BID) | OROMUCOSAL | Status: DC
  Administered 2016-11-01 (×2): 15 mL via OROMUCOSAL

## 2016-11-01 MED ORDER — ALBUMIN HUMAN 5 % IV SOLN
INTRAVENOUS | Status: DC | PRN
  Administered 2016-11-01: 13:00:00 via INTRAVENOUS

## 2016-11-01 MED ORDER — SODIUM CHLORIDE 0.9% FLUSH
3.0000 mL | Freq: Two times a day (BID) | INTRAVENOUS | Status: DC
  Administered 2016-11-01 – 2016-11-03 (×4): 3 mL via INTRAVENOUS

## 2016-11-01 MED ORDER — PROMETHAZINE HCL 25 MG/ML IJ SOLN: 6.2500 mg | INTRAMUSCULAR | Status: DC | PRN

## 2016-11-01 MED ORDER — LACTULOSE 10 GM/15ML PO SOLN
30.0000 g | Freq: Three times a day (TID) | ORAL | Status: DC
  Filled 2016-11-01: qty 45

## 2016-11-01 MED ORDER — MIDAZOLAM HCL 2 MG/2ML IJ SOLN
2.0000 mg | INTRAMUSCULAR | Status: DC | PRN
  Administered 2016-11-03 – 2016-11-04 (×5): 2 mg via INTRAVENOUS
  Filled 2016-11-01 (×6): qty 2

## 2016-11-01 MED ORDER — FENTANYL CITRATE (PF) 100 MCG/2ML IJ SOLN
100.0000 ug | Freq: Once | INTRAMUSCULAR | Status: AC
  Administered 2016-11-01: 100 ug via INTRAVENOUS

## 2016-11-01 MED ORDER — VANCOMYCIN HCL 1000 MG IV SOLR
INTRAVENOUS | Status: AC
  Filled 2016-11-01: qty 1000

## 2016-11-01 MED ORDER — VITAMIN B-1 100 MG PO TABS
100.0000 mg | ORAL_TABLET | Freq: Every day | ORAL | Status: DC
  Filled 2016-11-01: qty 1

## 2016-11-01 MED ORDER — THROMBIN 5000 UNITS EX SOLR
CUTANEOUS | Status: DC | PRN
  Administered 2016-11-01: 2000 [IU] via TOPICAL

## 2016-11-01 MED ORDER — SODIUM CHLORIDE 0.9 % IV SOLN
INTRAVENOUS | Status: DC
  Administered 2016-11-01 – 2016-11-09 (×5): via INTRAVENOUS

## 2016-11-01 MED ORDER — ONDANSETRON HCL 4 MG/2ML IJ SOLN: 4.0000 mg | Freq: Four times a day (QID) | INTRAMUSCULAR | Status: DC | PRN

## 2016-11-01 MED ORDER — SODIUM CHLORIDE 0.9% FLUSH
10.0000 mL | INTRAVENOUS | Status: DC | PRN
  Administered 2016-11-08: 10 mL
  Filled 2016-11-01: qty 40

## 2016-11-01 MED ORDER — SODIUM CHLORIDE 0.9% FLUSH: 3.0000 mL | INTRAVENOUS | Status: DC | PRN

## 2016-11-01 MED ORDER — PROPOFOL 10 MG/ML IV BOLUS
INTRAVENOUS | Status: AC
  Filled 2016-11-01: qty 20

## 2016-11-01 MED ORDER — ONDANSETRON HCL 4 MG/2ML IJ SOLN
INTRAMUSCULAR | Status: AC
  Filled 2016-11-01: qty 2

## 2016-11-01 MED ORDER — ONDANSETRON HCL 4 MG PO TABS: 4.0000 mg | ORAL_TABLET | Freq: Four times a day (QID) | ORAL | Status: DC | PRN

## 2016-11-01 MED ORDER — CHLORHEXIDINE GLUCONATE CLOTH 2 % EX PADS: 6.0000 | MEDICATED_PAD | Freq: Every day | CUTANEOUS | Status: DC

## 2016-11-01 MED ORDER — PANTOPRAZOLE SODIUM 40 MG PO PACK
40.0000 mg | PACK | Freq: Every day | ORAL | Status: DC
  Administered 2016-11-02 – 2016-11-03 (×2): 40 mg
  Filled 2016-11-01 (×3): qty 20

## 2016-11-01 MED ORDER — ETOMIDATE 2 MG/ML IV SOLN
20.0000 mg | Freq: Once | INTRAVENOUS | Status: AC
  Administered 2016-11-01: 20 mg via INTRAVENOUS

## 2016-11-01 MED ORDER — SODIUM CHLORIDE 0.9% FLUSH
10.0000 mL | Freq: Two times a day (BID) | INTRAVENOUS | Status: DC
  Administered 2016-11-01: 10 mL

## 2016-11-01 MED ORDER — DEXTROSE 5 % IV SOLN
2.0000 g | Freq: Two times a day (BID) | INTRAVENOUS | Status: DC
  Administered 2016-11-01 – 2016-11-02 (×2): 2 g via INTRAVENOUS
  Filled 2016-11-01 (×3): qty 2

## 2016-11-01 MED ORDER — ORAL CARE MOUTH RINSE
15.0000 mL | Freq: Four times a day (QID) | OROMUCOSAL | Status: DC
  Administered 2016-11-01: 15 mL via OROMUCOSAL

## 2016-11-01 MED ORDER — SODIUM CHLORIDE 0.9% FLUSH
10.0000 mL | Freq: Two times a day (BID) | INTRAVENOUS | Status: DC
  Administered 2016-11-01: 20 mL
  Administered 2016-11-01 – 2016-11-05 (×7): 10 mL
  Administered 2016-11-06: 30 mL
  Administered 2016-11-06 – 2016-11-11 (×5): 10 mL

## 2016-11-01 MED ORDER — FENTANYL CITRATE (PF) 100 MCG/2ML IJ SOLN
INTRAMUSCULAR | Status: AC
  Filled 2016-11-01: qty 2

## 2016-11-01 MED ORDER — METHYLPREDNISOLONE SODIUM SUCC 40 MG IJ SOLR
20.0000 mg | Freq: Two times a day (BID) | INTRAMUSCULAR | Status: DC
  Administered 2016-11-01 – 2016-11-02 (×2): 20 mg via INTRAVENOUS
  Filled 2016-11-01 (×2): qty 0.5

## 2016-11-01 MED ORDER — PROPOFOL 1000 MG/100ML IV EMUL
0.0000 ug/kg/min | INTRAVENOUS | Status: DC
  Administered 2016-11-01: 50 ug/kg/min via INTRAVENOUS
  Administered 2016-11-01: 30 ug/kg/min via INTRAVENOUS
  Administered 2016-11-01: 50 ug/kg/min via INTRAVENOUS
  Administered 2016-11-01: 30 ug/kg/min via INTRAVENOUS
  Administered 2016-11-01: 40 ug/kg/min via INTRAVENOUS
  Filled 2016-11-01 (×4): qty 100

## 2016-11-01 MED ORDER — FENTANYL BOLUS VIA INFUSION
50.0000 ug | INTRAVENOUS | Status: DC | PRN
  Filled 2016-11-01: qty 50

## 2016-11-01 MED ORDER — SODIUM CHLORIDE 0.9 % IV SOLN
25.0000 ug/h | INTRAVENOUS | Status: DC
  Administered 2016-11-01: 100 ug/h via INTRAVENOUS
  Filled 2016-11-01: qty 50

## 2016-11-01 MED ORDER — CHLORHEXIDINE GLUCONATE 0.12% ORAL RINSE (MEDLINE KIT)
15.0000 mL | Freq: Two times a day (BID) | OROMUCOSAL | Status: DC
  Administered 2016-11-01 – 2016-11-04 (×6): 15 mL via OROMUCOSAL

## 2016-11-01 MED ORDER — ROCURONIUM BROMIDE 50 MG/5ML IV SOLN
80.0000 mg | Freq: Once | INTRAVENOUS | Status: AC
  Administered 2016-11-01: 80 mg via INTRAVENOUS

## 2016-11-01 MED ORDER — VITAMIN B-1 100 MG PO TABS
100.0000 mg | ORAL_TABLET | Freq: Every day | ORAL | Status: DC
  Administered 2016-11-02 – 2016-11-03 (×2): 100 mg
  Filled 2016-11-01 (×3): qty 1

## 2016-11-01 MED ORDER — FOLIC ACID 1 MG PO TABS
1.0000 mg | ORAL_TABLET | Freq: Every day | ORAL | Status: DC
  Administered 2016-11-02 – 2016-11-03 (×2): 1 mg
  Filled 2016-11-01 (×3): qty 1

## 2016-11-01 MED ORDER — HEMOSTATIC AGENTS (NO CHARGE) OPTIME
TOPICAL | Status: DC | PRN
  Administered 2016-11-01: 1 via TOPICAL

## 2016-11-01 MED ORDER — LACTATED RINGERS IV SOLN: INTRAVENOUS | Status: DC

## 2016-11-01 MED ORDER — PHENYLEPHRINE 40 MCG/ML (10ML) SYRINGE FOR IV PUSH (FOR BLOOD PRESSURE SUPPORT)
PREFILLED_SYRINGE | INTRAVENOUS | Status: AC
  Filled 2016-11-01: qty 10

## 2016-11-01 MED ORDER — HYDROMORPHONE HCL 1 MG/ML IJ SOLN: 0.2500 mg | INTRAMUSCULAR | Status: DC | PRN

## 2016-11-01 MED ORDER — VANCOMYCIN HCL 1000 MG IV SOLR
INTRAVENOUS | Status: DC | PRN
  Administered 2016-11-01: 1000 mg

## 2016-11-01 MED ORDER — SODIUM CHLORIDE 0.9 % IJ SOLN
INTRAMUSCULAR | Status: AC
  Filled 2016-11-01: qty 10

## 2016-11-01 MED ORDER — VITAMIN K1 10 MG/ML IJ SOLN
10.0000 mg | Freq: Once | INTRAVENOUS | Status: AC
  Administered 2016-11-01: 10 mg via INTRAVENOUS
  Filled 2016-11-01: qty 1

## 2016-11-01 MED ORDER — MIDAZOLAM HCL 2 MG/2ML IJ SOLN
INTRAMUSCULAR | Status: AC
  Filled 2016-11-01: qty 6

## 2016-11-01 MED ORDER — VANCOMYCIN HCL IN DEXTROSE 1-5 GM/200ML-% IV SOLN
1000.0000 mg | Freq: Three times a day (TID) | INTRAVENOUS | Status: DC
  Administered 2016-11-01 – 2016-11-04 (×9): 1000 mg via INTRAVENOUS
  Filled 2016-11-01 (×10): qty 200

## 2016-11-01 MED ORDER — LACTULOSE 10 GM/15ML PO SOLN
30.0000 g | Freq: Three times a day (TID) | ORAL | Status: DC
  Administered 2016-11-01 – 2016-11-03 (×6): 30 g via ORAL
  Filled 2016-11-01 (×6): qty 45

## 2016-11-01 MED ORDER — PROPOFOL 1000 MG/100ML IV EMUL
0.0000 ug/kg/min | INTRAVENOUS | Status: DC
  Administered 2016-11-01: 20 ug/kg/min via INTRAVENOUS
  Administered 2016-11-01: 25 ug/kg/min via INTRAVENOUS
  Administered 2016-11-02: 40 ug/kg/min via INTRAVENOUS
  Administered 2016-11-02: 50 ug/kg/min via INTRAVENOUS
  Administered 2016-11-02: 40 ug/kg/min via INTRAVENOUS
  Filled 2016-11-01 (×6): qty 100

## 2016-11-01 MED ORDER — PANTOPRAZOLE SODIUM 40 MG PO PACK
40.0000 mg | PACK | Freq: Every day | ORAL | Status: DC
  Filled 2016-11-01: qty 20

## 2016-11-01 MED ORDER — ROCURONIUM BROMIDE 100 MG/10ML IV SOLN
INTRAVENOUS | Status: DC | PRN
  Administered 2016-11-01: 50 mg via INTRAVENOUS
  Administered 2016-11-01: 20 mg via INTRAVENOUS
  Administered 2016-11-01: 30 mg via INTRAVENOUS

## 2016-11-01 MED ORDER — MIDAZOLAM HCL 5 MG/5ML IJ SOLN
INTRAMUSCULAR | Status: DC | PRN
  Administered 2016-11-01: 2 mg via INTRAVENOUS

## 2016-11-01 MED ORDER — ADULT MULTIVITAMIN W/MINERALS CH
1.0000 | ORAL_TABLET | Freq: Every day | ORAL | Status: DC
  Filled 2016-11-01: qty 1

## 2016-11-01 SURGICAL SUPPLY — 99 items
BAG DECANTER FOR FLEXI CONT (MISCELLANEOUS) ×4 IMPLANT
BANDAGE ACE 4X5 VEL STRL LF (GAUZE/BANDAGES/DRESSINGS) ×2 IMPLANT
BANDAGE ACE 6X5 VEL STRL LF (GAUZE/BANDAGES/DRESSINGS) ×2 IMPLANT
BNDG CMPR MED 15X6 ELC VLCR LF (GAUZE/BANDAGES/DRESSINGS) ×2
BNDG COHESIVE 4X5 TAN STRL (GAUZE/BANDAGES/DRESSINGS) ×4 IMPLANT
BNDG ELASTIC 6X15 VLCR STRL LF (GAUZE/BANDAGES/DRESSINGS) ×4 IMPLANT
BNDG GAUZE ELAST 4 BULKY (GAUZE/BANDAGES/DRESSINGS) ×4 IMPLANT
CLOSURE STERI-STRIP 1/2X4 (GAUZE/BANDAGES/DRESSINGS)
CLOSURE WOUND 1/2 X4 (GAUZE/BANDAGES/DRESSINGS) ×1
CLSR STERI-STRIP ANTIMIC 1/2X4 (GAUZE/BANDAGES/DRESSINGS) IMPLANT
CORDS BIPOLAR (ELECTRODE) ×4 IMPLANT
COUNTER NEEDLE 20 DBL MAG RED (NEEDLE) ×2 IMPLANT
COVER SURGICAL LIGHT HANDLE (MISCELLANEOUS) ×4 IMPLANT
CUFF TOURNIQUET SINGLE 18IN (TOURNIQUET CUFF) ×2 IMPLANT
CUFF TOURNIQUET SINGLE 24IN (TOURNIQUET CUFF) IMPLANT
CUFF TOURNIQUET SINGLE 34IN LL (TOURNIQUET CUFF) IMPLANT
CUFF TOURNIQUET SINGLE 44IN (TOURNIQUET CUFF) IMPLANT
DRAIN CHANNEL 15F RND FF W/TCR (WOUND CARE) ×2 IMPLANT
DRAIN SNY 10 ROU (WOUND CARE) ×2 IMPLANT
DRAPE POUCH INSTRU U-SHP 10X18 (DRAPES) ×4 IMPLANT
DRAPE SURG 17X11 SM STRL (DRAPES) ×4 IMPLANT
DRAPE U-SHAPE 47X51 STRL (DRAPES) ×6 IMPLANT
DRSG ADAPTIC 3X8 NADH LF (GAUZE/BANDAGES/DRESSINGS) ×2 IMPLANT
DRSG AQUACEL AG ADV 3.5X 4 (GAUZE/BANDAGES/DRESSINGS) ×2 IMPLANT
DRSG AQUACEL AG ADV 3.5X 6 (GAUZE/BANDAGES/DRESSINGS) ×2 IMPLANT
DRSG EMULSION OIL 3X3 NADH (GAUZE/BANDAGES/DRESSINGS) ×2 IMPLANT
DRSG PAD ABDOMINAL 8X10 ST (GAUZE/BANDAGES/DRESSINGS) ×6 IMPLANT
DURAPREP 26ML APPLICATOR (WOUND CARE) ×12 IMPLANT
ELECT BLADE 4.0 EZ CLEAN MEGAD (MISCELLANEOUS) ×4
ELECT PENCIL ROCKER SW 15FT (MISCELLANEOUS) ×6 IMPLANT
ELECT REM PT RETURN 9FT ADLT (ELECTROSURGICAL) ×8
ELECTRODE BLDE 4.0 EZ CLN MEGD (MISCELLANEOUS) ×2 IMPLANT
ELECTRODE REM PT RTRN 9FT ADLT (ELECTROSURGICAL) ×2 IMPLANT
EVACUATOR SILICONE 100CC (DRAIN) ×4 IMPLANT
GAUZE SPONGE 4X4 12PLY STRL (GAUZE/BANDAGES/DRESSINGS) ×4 IMPLANT
GAUZE XEROFORM 5X9 LF (GAUZE/BANDAGES/DRESSINGS) ×2 IMPLANT
GLOVE BIO SURGEON STRL SZ 6.5 (GLOVE) ×2 IMPLANT
GLOVE BIO SURGEONS STRL SZ 6.5 (GLOVE)
GLOVE BIOGEL PI IND STRL 6.5 (GLOVE) ×2 IMPLANT
GLOVE BIOGEL PI IND STRL 8.5 (GLOVE) ×2 IMPLANT
GLOVE BIOGEL PI INDICATOR 6.5 (GLOVE)
GLOVE BIOGEL PI INDICATOR 8.5 (GLOVE) ×4
GLOVE SS BIOGEL STRL SZ 8.5 (GLOVE) ×2 IMPLANT
GLOVE SUPERSENSE BIOGEL SZ 8.5 (GLOVE) ×4
GOWN STRL REUS W/ TWL LRG LVL3 (GOWN DISPOSABLE) ×2 IMPLANT
GOWN STRL REUS W/TWL 2XL LVL3 (GOWN DISPOSABLE) ×12 IMPLANT
GOWN STRL REUS W/TWL LRG LVL3 (GOWN DISPOSABLE)
HANDPIECE INTERPULSE COAX TIP (DISPOSABLE) ×4
KIT BASIN OR (CUSTOM PROCEDURE TRAY) ×4 IMPLANT
KIT ROOM TURNOVER OR (KITS) ×4 IMPLANT
KIT STIMULAN RAPID CURE  10CC (Orthopedic Implant) ×2 IMPLANT
KIT STIMULAN RAPID CURE 10CC (Orthopedic Implant) IMPLANT
MANIFOLD NEPTUNE II (INSTRUMENTS) ×4 IMPLANT
NDL HYPO 25GX1X1/2 BEV (NEEDLE) ×2 IMPLANT
NDL SPNL 18GX3.5 QUINCKE PK (NEEDLE) ×4 IMPLANT
NEEDLE 22X1 1/2 (OR ONLY) (NEEDLE) ×2 IMPLANT
NEEDLE HYPO 25GX1X1/2 BEV (NEEDLE) IMPLANT
NEEDLE SPNL 18GX3.5 QUINCKE PK (NEEDLE) ×8 IMPLANT
NS IRRIG 1000ML POUR BTL (IV SOLUTION) ×4 IMPLANT
PACK LAMINECTOMY ORTHO (CUSTOM PROCEDURE TRAY) ×4 IMPLANT
PACK ORTHO EXTREMITY (CUSTOM PROCEDURE TRAY) ×2 IMPLANT
PACK UNIVERSAL I (CUSTOM PROCEDURE TRAY) ×2 IMPLANT
PAD ARMBOARD 7.5X6 YLW CONV (MISCELLANEOUS) ×10 IMPLANT
PATTIES SURGICAL .5 X.5 (GAUZE/BANDAGES/DRESSINGS) IMPLANT
PATTIES SURGICAL .5 X1 (DISPOSABLE) ×4 IMPLANT
SET CYSTO W/LG BORE CLAMP LF (SET/KITS/TRAYS/PACK) ×2 IMPLANT
SET HNDPC FAN SPRY TIP SCT (DISPOSABLE) IMPLANT
SPONGE LAP 18X18 X RAY DECT (DISPOSABLE) ×4 IMPLANT
SPONGE LAP 4X18 X RAY DECT (DISPOSABLE) ×6 IMPLANT
SPONGE SURGIFOAM ABS GEL 100 (HEMOSTASIS) ×4 IMPLANT
STAPLER VISISTAT 35W (STAPLE) ×2 IMPLANT
STOCKINETTE IMPERVIOUS 9X36 MD (GAUZE/BANDAGES/DRESSINGS) ×4 IMPLANT
STRIP CLOSURE SKIN 1/2X4 (GAUZE/BANDAGES/DRESSINGS) ×1 IMPLANT
SURGIFLO W/THROMBIN 8M KIT (HEMOSTASIS) IMPLANT
SUT BONE WAX W31G (SUTURE) ×4 IMPLANT
SUT ETHILON 2 0 PSLX (SUTURE) ×8 IMPLANT
SUT ETHILON 4 0 PS 2 18 (SUTURE) ×12 IMPLANT
SUT MON AB 3-0 SH 27 (SUTURE) ×4
SUT MON AB 3-0 SH27 (SUTURE) ×2 IMPLANT
SUT PDS 0 CT 1 18  CR/8 (SUTURE) ×4 IMPLANT
SUT PROLENE 1 CT (SUTURE) ×4 IMPLANT
SUT VIC AB 1 CT1 18XCR BRD 8 (SUTURE) IMPLANT
SUT VIC AB 1 CT1 27 (SUTURE)
SUT VIC AB 1 CT1 27XBRD ANTBC (SUTURE) ×4 IMPLANT
SUT VIC AB 1 CT1 8-18 (SUTURE) ×4
SUT VIC AB 2-0 CT1 18 (SUTURE) ×6 IMPLANT
SUT VICRYL 0 UR6 27IN ABS (SUTURE) ×2 IMPLANT
SWABSTICK BENZOIN STERILE (MISCELLANEOUS) ×2 IMPLANT
SYR BULB IRRIGATION 50ML (SYRINGE) ×4 IMPLANT
SYR CONTROL 10ML LL (SYRINGE) ×4 IMPLANT
TOWEL OR 17X24 6PK STRL BLUE (TOWEL DISPOSABLE) ×4 IMPLANT
TOWEL OR 17X26 10 PK STRL BLUE (TOWEL DISPOSABLE) ×8 IMPLANT
TRAY FOLEY CATH 16FRSI W/METER (SET/KITS/TRAYS/PACK) IMPLANT
TUBE ANAEROBIC SPECIMEN COL (MISCELLANEOUS) ×4 IMPLANT
TUBE CONNECTING 12'X1/4 (SUCTIONS) ×2
TUBE CONNECTING 12X1/4 (SUCTIONS) ×4 IMPLANT
UNDERPAD 30X30 (UNDERPADS AND DIAPERS) ×4 IMPLANT
WATER STERILE IRR 1000ML POUR (IV SOLUTION) ×4 IMPLANT
YANKAUER SUCT BULB TIP NO VENT (SUCTIONS) ×6 IMPLANT

## 2016-11-01 NOTE — Consult Note (Signed)
PCCM Consult Note  Admission date: 10/29/2016 Consult date: 10/31/2016 Referring provider: Dr. Sherral Hammers  CC: Delirium  HPI: 36 yo male developed swelling in Rt leg and foot, and back pain.  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  He was seen by orthopedics, and started on IV abx.  He has hx of Hepatitis C and ETOH.  He was noted to have jaundice and was seen by GI.  He was started on prednisone for alcoholic hepatitis.  He was also seen by ID.  He developed progressive altered mental status due to delirium tremens.  He was transferred to ICU.  SUBJECTIVE : No issues since intubated. Received FFP and vitamin K. Going to OR. Was agitated prior to intubation.    Vital signs: BP (!) 104/59   Pulse 62   Temp 97.3 F (36.3 C) (Oral)   Resp 20   Ht 5\' 11"  (1.803 m)   Wt 120.7 kg (266 lb 1.6 oz)   SpO2 100%   BMI 37.11 kg/m   Intake/output: I/O last 3 completed shifts: In: 1570.4 [P.O.:1120; I.V.:250.4; IV Piggyback:200] Out: 1700 [Urine:1700]  General: diaphoretic. Sedated, intubated.  Neuro: sedated. Intubated. CN grossly intact. (-) lateralizing signs.  Eyes: Jaundiced ENT: no stridor Cardiac: tachycardic, no murmur Chest: no wheeze. Good ae. CTA.  Abd: soft, decreased BS Ext: Rt leg warm, swollen Skin: redness over Rt leg   CMP Latest Ref Rng & Units 11/01/2016 10/31/2016 10/30/2016  Glucose 65 - 99 mg/dL 150(H) 80 89  BUN 6 - 20 mg/dL 11 7 9   Creatinine 0.61 - 1.24 mg/dL 0.67 0.73 0.67  Sodium 135 - 145 mmol/L 132(L) 133(L) 134(L)  Potassium 3.5 - 5.1 mmol/L 4.5 3.6 3.7  Chloride 101 - 111 mmol/L 104 102 103  CO2 22 - 32 mmol/L 25 23 21(L)  Calcium 8.9 - 10.3 mg/dL 7.7(L) 7.7(L) 7.4(L)  Total Protein 6.5 - 8.1 g/dL 6.1(L) 6.4(L) 6.0(L)  Total Bilirubin 0.3 - 1.2 mg/dL 15.2(H) 16.0(H) 14.1(H)  Alkaline Phos 38 - 126 U/L 166(H) 212(H) 189(H)  AST 15 - 41 U/L 263(H) 268(H) 227(H)  ALT 17 - 63 U/L 91(H)  78(H) 69(H)     CBC Latest Ref Rng & Units 10/31/2016 10/30/2016 10/29/2016  WBC 4.0 - 10.5 K/uL 17.6(H) 15.5(H) 19.3(H)  Hemoglobin 13.0 - 17.0 g/dL 11.8(L) 10.8(L) 11.4(L)  Hematocrit 39.0 - 52.0 % 34.4(L) 31.7(L) 32.2(L)  Platelets 150 - 400 K/uL 57(L) 51(L) 34(L)    Lab Results  Component Value Date   INR 1.81 11/01/2016   INR 2.30 11/01/2016   INR 2.16 10/31/2016    ABG    Component Value Date/Time   PHART 7.362 11/01/2016 0120   PCO2ART 41.7 11/01/2016 0120   PO2ART 65.6 (L) 11/01/2016 0120   HCO3 23.1 11/01/2016 0120   ACIDBASEDEF 1.5 11/01/2016 0120   O2SAT 89.7 11/01/2016 0120     CBG (last 3)   Recent Labs  10/31/16 2319 11/01/16 0349 11/01/16 0853  GLUCAP 138* 126* 126*     Imaging: Dg Lumbar Spine 2-3 Views  Result Date: 11/01/2016 CLINICAL DATA:  Post lumbar laminectomy. EXAM: LUMBAR SPINE - 2-3 VIEW COMPARISON:  None. FINDINGS: Surgical probe positioned over the posterior elements at the level of the L4-5 disc space. IMPRESSION: Surgical probe with tip overlying the posterior elements at the L4-5 disc space level. Discussed with Dr. Rolena Infante in the [place]. Electronically Signed   By: Roxy Horseman.D.  On: 11/01/2016 12:11   Mr Tibia Fibula Right W Wo Contrast  Result Date: 10/31/2016 CLINICAL DATA:  Severe right lower extremity pain and swelling. History of diskitis and osteomyelitis. EXAM: MRI OF THE RIGHT FEMUR WITHOUT AND WITH CONTRAST; MRI OF LOWER RIGHT EXTREMITY WITHOUT AND WITH CONTRAST TECHNIQUE: Multiplanar, multisequence MR imaging of the right lower extremity was performed both before and after administration of intravenous contrast. CONTRAST:  10mL MULTIHANCE GADOBENATE DIMEGLUMINE 529 MG/ML IV SOLN COMPARISON:  None. FINDINGS: The hip and knee joints are grossly maintained. No definite findings for septic arthritis or osteomyelitis. There is extensive pyomyositis involving the posterior compartment of the thigh. Multiple large abscesses surrounding the  hamstring muscles. This also rim enhancing fluid/ abscess along the entire course of the sciatic/femoral nerve which is markedly inflamed. Diffuse subcutaneous soft tissue swelling/ edema involving both lower extremities, right greater than left consistent with cellulitis. I do not see any significant findings below-the-knee other than cellulitis. No myofasciitis or pyomyositis. No osteomyelitis or septic arthritis. IMPRESSION: 1. Extensive abscesses involving the posterior compartment of the thigh along with myofasciitis and cellulitis. There is also abscess all along the course of the sciatic/femoral nerve which is thickened and inflamed. 2. No findings for septic arthritis or osteomyelitis. 3. Cellulitis below the knee but no myofasciitis or pyomyositis. These results will be called to the ordering clinician or representative by the Radiologist Assistant, and communication documented in the PACS or zVision Dashboard. Electronically Signed   By: Marijo Sanes M.D.   On: 10/31/2016 08:24   Mr Femur Right W Wo Contrast  Result Date: 10/31/2016 CLINICAL DATA:  Severe right lower extremity pain and swelling. History of diskitis and osteomyelitis. EXAM: MRI OF THE RIGHT FEMUR WITHOUT AND WITH CONTRAST; MRI OF LOWER RIGHT EXTREMITY WITHOUT AND WITH CONTRAST TECHNIQUE: Multiplanar, multisequence MR imaging of the right lower extremity was performed both before and after administration of intravenous contrast. CONTRAST:  63mL MULTIHANCE GADOBENATE DIMEGLUMINE 529 MG/ML IV SOLN COMPARISON:  None. FINDINGS: The hip and knee joints are grossly maintained. No definite findings for septic arthritis or osteomyelitis. There is extensive pyomyositis involving the posterior compartment of the thigh. Multiple large abscesses surrounding the hamstring muscles. This also rim enhancing fluid/ abscess along the entire course of the sciatic/femoral nerve which is markedly inflamed. Diffuse subcutaneous soft tissue swelling/ edema  involving both lower extremities, right greater than left consistent with cellulitis. I do not see any significant findings below-the-knee other than cellulitis. No myofasciitis or pyomyositis. No osteomyelitis or septic arthritis. IMPRESSION: 1. Extensive abscesses involving the posterior compartment of the thigh along with myofasciitis and cellulitis. There is also abscess all along the course of the sciatic/femoral nerve which is thickened and inflamed. 2. No findings for septic arthritis or osteomyelitis. 3. Cellulitis below the knee but no myofasciitis or pyomyositis. These results will be called to the ordering clinician or representative by the Radiologist Assistant, and communication documented in the PACS or zVision Dashboard. Electronically Signed   By: Marijo Sanes M.D.   On: 10/31/2016 08:24   Dg Chest Port 1 View  Result Date: 11/01/2016 CLINICAL DATA:  Life support line placement. History of hepatitis-C and epidural abscess. EXAM: PORTABLE CHEST 1 VIEW COMPARISON:  Chest radiograph October 29, 2016 FINDINGS: LEFT internal jugular central venous catheter distal tip projects at cavoatrial junction. Endotracheal tube tip projects 4 cm above the carina. Nasogastric tube tip projects in proximal stomach. The cardiac silhouette is mildly enlarged. Low inspiratory examination with crowded, mildly engorged  vascular markings and mild interstitial prominence. No pleural effusion. No focal consolidation. No pneumothorax. Large body habitus. Osseous structures are nonsuspicious. IMPRESSION: LEFT internal jugular central venous catheter distal tip projects at cavoatrial junction. Endotracheal tube tip projects 4 cm above the carina. Nasogastric tube tip projects in proximal stomach. Stable cardiomegaly and mild interstitial prominence. Electronically Signed   By: Elon Alas M.D.   On: 11/01/2016 01:23     Studies: RUQ u/s 2/01 >> diffuse hepatic echogenicity MR Rt leg 2/02 >> abscess posterior  compartment of thigh, along course of sciatic/femoral nerve, cellulitis below the knee  Antibiotics: Zosyn 2/01 >> 2/01 Vancomycin 2/01 >> Cefepime 2/02 >>  Cultures: Blood 2/01 >>  Lines/tubes: ETT 2/3 >  TLC  2/3 >  Events: 2/01 Admit 2/03 Transfer to ICU, start precedex  Summary: 36 yo with L4-5 septic arthritis, paraspinal abscess, Rt thigh abscess, and Rt lower leg cellulitis.  He has hx of ETOH with acute alcoholic hepatitis, and hepatitis C.  He developed altered mental status from delirium tremens.  Assessment/plan:  Acute hypoxemic respiratory failure 2/2 unable to protect airway 2/2 DTs. - cont vent support.  - going to OR for spinal abscess.   Encephalopathy 2/2 Delirium tremens. Possible Drug abuse. Also related to hepatic encephalopathy.  - fentanyl drip, versed pushes.  - RASS goal 0 to -1 - thiamine, folic acid - may need precedex.  - cont lactulose  Acute alcoholic hepatitis. Hx of Hepatitis C. - Solumedrol while NPO - f/u LFTs - GI following - lactulose to keep 3-5 BM  L4-5 septic arthritis, paraspinal abscess, Rt thigh abscess, Rt lower leg cellulitis. - plan for surgery 2/04 - Abx per ID  Coagulopathy, thrombocytopenia 2nd to liver disease. S/P Vit K and 4u FFP - f/u CBC, INR post op  DVT prophylaxis - SCDs SUP - Protonix Nutrition - NPO. Start TF post op.  Goals of care - full code  Updated pt's mother at bedside.      I spent  30   minutes of Critical Care time with this patient today.     Monica Becton, MD 11/01/2016, 12:22 PM [facility] Pager [phone] After 3 pm or if no answer, call [phone]

## 2016-11-01 NOTE — Anesthesia Procedure Notes (Deleted)
Anesthesia Regional Block:  Popliteal block  Pre-Anesthetic Checklist: ,, timeout performed, Correct Patient, Correct Site, Correct Laterality, Correct Procedure, Correct Position, site marked, Risks and benefits discussed,  Surgical consent,  Pre-op evaluation,  At surgeon's request and post-op pain management  Laterality: Left  Prep: chloraprep       Needles:  Injection technique: Single-shot  Needle Type: Echogenic Stimulator Needle     Needle Length:cm 9 cm Needle Gauge: 21 G    Additional Needles:  Procedures: ultrasound guided (picture in chart) and nerve stimulator Popliteal block  Nerve Stimulator or Paresthesia:  Response: plantar flexion, 0.45 mA,   Additional Responses:   Narrative:  Start time: 11/01/2016 8:02 AM End time: 11/01/2016 8:12 AM Injection made incrementally with aspirations every 5 mL.  Performed by: Personally  Anesthesiologist: Duane Boston  Additional Notes: A functioning IV was confirmed and monitors were applied.  Sterile prep and drape, hand hygiene and sterile gloves were used.  Negative aspiration and test dose prior to incremental administration of local anesthetic. The patient tolerated the procedure well.Ultrasound  guidance: relevant anatomy identified, needle position confirmed, local anesthetic spread visualized around nerve(s), vascular puncture avoided.  Image printed for medical record.

## 2016-11-01 NOTE — Progress Notes (Signed)
Patient with acute mental status changes overnight - as a result intubated. Currently in MICU.  Clinical exam: Limited due to intubation and sedation Positive withdrawal to pain Negative babinski No clonus Right LE swelling remains 1+ DP/PT pulses  Labs: WBC: 17.2 ESR: 70 INR: 2.3 (prior to FFP) PT/PTT: 25.7/43  (prior to FFP) Plt: 57  Plan: FFP was not given overnight as planned  Patient with acute mental status changes most likely due to DT's  Spoke with patients mother who is at bedside - explained that risk is higher due to DT's but the fact that he is   Decompensating is of great concern.  Plan: repeat INR  1.81  Spoke with anesthesia (Dr.Singer) - will proceed with surgery as planned.    Will give additional FFP and plts prior to incision Will try and work simultaneously with Dr Veverly Fells to decrease overall OR time.

## 2016-11-01 NOTE — Progress Notes (Signed)
Patient arrived back on unit. Bp101/48 100% on RR

## 2016-11-01 NOTE — H&P (View-Only) (Signed)
Discussed case with patient and his family Given the progression in his pain and the new MRI findings it is my feeling that we need to move forward with surgical debridement of the spine and leg  Abscess.  Alternative is iv ABX and brace. Surgical plan:   1.right sided gill decompression - this will remove the entire infected right L4/5 facet and pars.  This will allow for right lateral recess decompression as well as L4 and L5 nerve decompression.  This will also allow for direct irrigation of the ventral abscess.  2. Dr Veverly Fells will debride the right posterior thigh abscess as well provided he remains stable.  Discussed issue with anesthesia and medical team. Patient aware that this is a high risk procedure given his medical condition.  However without treatment he will most likely continue to deteriorate.  Explained risks: death, stroke, paralysis, ongoing or worse back pain, need for additional surgery including fusion, leak of spinal fluid. Medical team to optimize patient tonight - FFP and plts. All questions addressed. Plan on surgery Sunday (9/930 AM)

## 2016-11-01 NOTE — Procedures (Signed)
Intubation Procedure Note [name] [id number] 27-Nov-1980  Procedure: Intubation Indications: Airway protection and maintenance  Procedure Details Consent: Risks of procedure as well as the alternatives and risks of each were explained to the (patient/caregiver).  Consent for procedure obtained. Time Out: Verified patient identification, verified procedure, site/side was marked, verified correct patient position, special equipment/implants available, medications/allergies/relevent history reviewed, required imaging and test results available.  Performed  Maximum sterile technique was used including gloves and hand hygiene.  MAC and 3  Given 5 mg versed, 100 mcg fentanyl, 20 mg etomidate, 80 mg rocuronium.  Inserted 7.5 ETT to 23 cm at lip.  Confirmed with CO2 detector and ausculation.  Evaluation Hemodynamic Status: BP stable throughout; O2 sats: stable throughout Patient's Current Condition: stable Complications: No apparent complications Patient did tolerate procedure well. Chest X-ray ordered to verify placement.  CXR: pending.   Jose Mires, MD [facility] Pulmonary/Critical Care 11/01/2016, 12:21 AM Pager:  [phone] After 3pm call: [phone]

## 2016-11-01 NOTE — Transfer of Care (Signed)
Immediate Anesthesia Transfer of Care Note  Patient: [name]  Procedure(s) Performed: Procedure(s): DECOMPRESSIVE POSTERIOR LUMBAR LAMINECTOMY LEVEL 4-5 (N/A) IRRIGATION AND DEBRIDEMENT ABSCESS RIGHT THIGH (Right)  Patient Location: ICU  Anesthesia Type:General  Level of Consciousness: Patient remains intubated per anesthesia plan  Airway & Oxygen Therapy: Patient remains intubated per anesthesia plan and Patient placed on Ventilator (see vital sign flow sheet for setting)  Post-op Assessment: Report given to RN and Post -op Vital signs reviewed and stable  Post vital signs: Reviewed and stable  Last Vitals:  Vitals:   11/01/16 0838 11/01/16 0848  BP: (!) 104/59   Pulse: 62   Resp: 20   Temp:  36.3 C    Last Pain:  Vitals:   11/01/16 0848  TempSrc: Oral  PainSc:       Patients Stated Pain Goal: 2 ([id number])  Complications: No apparent anesthesia complications

## 2016-11-01 NOTE — Procedures (Signed)
Central Venous Catheter Insertion Procedure Note Rishard Gottsch [id number] 06/15/1981  Procedure: Insertion of Central Venous Catheter Indications: Drug and/or fluid administration  Procedure Details Consent: Risks of procedure as well as the alternatives and risks of each were explained to the (patient/caregiver).  Consent for procedure obtained. Time Out: Verified patient identification, verified procedure, site/side was marked, verified correct patient position, special equipment/implants available, medications/allergies/relevent history reviewed, required imaging and test results available.  Performed  Maximum sterile technique was used including antiseptics, cap, gloves, gown, hand hygiene, mask and sheet. Skin prep: Chlorhexidine; local anesthetic administered A antimicrobial bonded/coated triple lumen catheter was placed in the left internal jugular vein using the Seldinger technique.  Placed with u/s guidance.  Inserted to 20 cm at skin.  Biopatch placed.  Sutured in place.  Evaluation Blood flow good Complications: No apparent complications Patient did tolerate procedure well. Chest X-ray ordered to verify placement.  CXR: pending.  [name], MD [facility] Pulmonary/Critical Care 11/01/2016, 1:02 AM Pager:  [phone] After 3pm call: [phone]

## 2016-11-01 NOTE — Anesthesia Postprocedure Evaluation (Addendum)
Anesthesia Post Note  Patient: [name]  Procedure(s) Performed: Procedure(s) (LRB): DECOMPRESSIVE POSTERIOR LUMBAR LAMINECTOMY LEVEL 4-5 (N/A) IRRIGATION AND DEBRIDEMENT ABSCESS RIGHT THIGH (Right)  Patient location during evaluation: SICU Anesthesia Type: General Level of consciousness: sedated Pain management: pain level controlled Vital Signs Assessment: post-procedure vital signs reviewed and stable Respiratory status: patient remains intubated per anesthesia plan Cardiovascular status: stable Anesthetic complications: no       Last Vitals:  Vitals:   11/01/16 0838 11/01/16 0848  BP: (!) 104/59   Pulse: 62   Resp: 20   Temp:  36.3 C    Last Pain:  Vitals:   11/01/16 0848  TempSrc: Oral  PainSc:                  Adden Strout DANIEL

## 2016-11-01 NOTE — Interval H&P Note (Signed)
History and Physical Interval Note:  11/01/2016 9:48 AM  [name]  has presented today for surgery, with the diagnosis of multiple abscesses including a right leg abscess. The various methods of treatment have been discussed with the patient and family. After consideration of risks, benefits and other options for treatment, the patient has consented to  Procedure(s): DECOMPRESSIVE POSTERIOR LUMBAR LAMINECTOMY LEVEL 4-5 (N/A) [employer] (Right) as a surgical intervention . I have been asked by Dr Rolena Infante to assist with the posterior thigh I+D.  I have discussed the grave prognosis with the patient's family as the patient is intubated and sedated.  The patient's history has been reviewed, patient examined, patient is optimized for surgery but is still at tremendous perioperative risk for morbidity and mortality.  I have reviewed the patient's chart and labs.  Questions were answered to the patient's satisfaction.     Nellie Pester,STEVEN R

## 2016-11-01 NOTE — Progress Notes (Signed)
The pt became increasingly agitated throughout the shift. Dr. Maudie Mercury was notified regarding pt's increasing CIWA score, hallucinations and ammonia level. Orders for ativan, haldol and lactulose received. Pt refused to drink lactulose, however he did receive the other medications. The pt continued to have auditory and visual hallucinations. At one point he pulled out both of his iv's and tried to open the windows to get outside. He was not redirectable and rapid response and security were called along with Dr. Maudie Mercury. Dr. Maudie Mercury gave verbal orders for restraints, as pt was continuing to be a threat to himself, other patients and staff. Pt was eventually transferred to ICU. Family was notified of transfer and pt's condition .

## 2016-11-01 NOTE — Progress Notes (Signed)
Patient off unit to OR.

## 2016-11-01 NOTE — Progress Notes (Signed)
Spoke with Lab and Preliminary PT/INR = 21.2/1.81 - Have ordered another 2 units of FFP given Pt's

## 2016-11-01 NOTE — Progress Notes (Signed)
PCCM Consult Note  Admission date: 10/29/2016 Consult date: 10/31/2016 Referring provider: Dr. Sherral Hammers  CC: Delirium  HPI: 36 yo male developed swelling in Rt leg and foot, and back pain.  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  He was seen by orthopedics, and started on IV abx.  He has hx of Hepatitis C and ETOH.  He was noted to have jaundice and was seen by GI.  He was started on prednisone for alcoholic hepatitis.  He was also seen by ID.  He developed progressive altered mental status due to delirium tremens.  He was transferred to ICU.  SUBJECTIVE : No issues since intubated. Received FFP and vitamin K. Going to OR. Was agitated prior to intubation.    Vital signs: BP (!) 104/59   Pulse 62   Temp 97.3 F (36.3 C) (Oral)   Resp 20   Ht 5\' 11"  (1.803 m)   Wt 120.7 kg (266 lb 1.6 oz)   SpO2 100%   BMI 37.11 kg/m   Intake/output: I/O last 3 completed shifts: In: 1570.4 [P.O.:1120; I.V.:250.4; IV Piggyback:200] Out: 1700 [Urine:1700]  General: diaphoretic. Sedated, intubated.  Neuro: sedated. Intubated. CN grossly intact. (-) lateralizing signs.  Eyes: Jaundiced ENT: no stridor Cardiac: tachycardic, no murmur Chest: no wheeze. Good ae. CTA.  Abd: soft, decreased BS Ext: Rt leg warm, swollen Skin: redness over Rt leg   CMP Latest Ref Rng & Units 11/01/2016 10/31/2016 10/30/2016  Glucose 65 - 99 mg/dL 150(H) 80 89  BUN 6 - 20 mg/dL 11 7 9   Creatinine 0.61 - 1.24 mg/dL 0.67 0.73 0.67  Sodium 135 - 145 mmol/L 132(L) 133(L) 134(L)  Potassium 3.5 - 5.1 mmol/L 4.5 3.6 3.7  Chloride 101 - 111 mmol/L 104 102 103  CO2 22 - 32 mmol/L 25 23 21(L)  Calcium 8.9 - 10.3 mg/dL 7.7(L) 7.7(L) 7.4(L)  Total Protein 6.5 - 8.1 g/dL 6.1(L) 6.4(L) 6.0(L)  Total Bilirubin 0.3 - 1.2 mg/dL 15.2(H) 16.0(H) 14.1(H)  Alkaline Phos 38 - 126 U/L 166(H) 212(H) 189(H)  AST 15 - 41 U/L 263(H) 268(H) 227(H)  ALT 17 - 63 U/L 91(H)  78(H) 69(H)     CBC Latest Ref Rng & Units 10/31/2016 10/30/2016 10/29/2016  WBC 4.0 - 10.5 K/uL 17.6(H) 15.5(H) 19.3(H)  Hemoglobin 13.0 - 17.0 g/dL 11.8(L) 10.8(L) 11.4(L)  Hematocrit 39.0 - 52.0 % 34.4(L) 31.7(L) 32.2(L)  Platelets 150 - 400 K/uL 57(L) 51(L) 34(L)    Lab Results  Component Value Date   INR 1.81 11/01/2016   INR 2.30 11/01/2016   INR 2.16 10/31/2016    ABG    Component Value Date/Time   PHART 7.362 11/01/2016 0120   PCO2ART 41.7 11/01/2016 0120   PO2ART 65.6 (L) 11/01/2016 0120   HCO3 23.1 11/01/2016 0120   ACIDBASEDEF 1.5 11/01/2016 0120   O2SAT 89.7 11/01/2016 0120     CBG (last 3)   Recent Labs  10/31/16 2319 11/01/16 0349 11/01/16 0853  GLUCAP 138* 126* 126*     Imaging: Dg Lumbar Spine 2-3 Views  Result Date: 11/01/2016 CLINICAL DATA:  Post lumbar laminectomy. EXAM: LUMBAR SPINE - 2-3 VIEW COMPARISON:  None. FINDINGS: Surgical probe positioned over the posterior elements at the level of the L4-5 disc space. IMPRESSION: Surgical probe with tip overlying the posterior elements at the L4-5 disc space level. Discussed with Dr. Rolena Infante in the [place]. Electronically Signed   By: Roxy Horseman.D.  On: 11/01/2016 12:11   Mr Tibia Fibula Right W Wo Contrast  Result Date: 10/31/2016 CLINICAL DATA:  Severe right lower extremity pain and swelling. History of diskitis and osteomyelitis. EXAM: MRI OF THE RIGHT FEMUR WITHOUT AND WITH CONTRAST; MRI OF LOWER RIGHT EXTREMITY WITHOUT AND WITH CONTRAST TECHNIQUE: Multiplanar, multisequence MR imaging of the right lower extremity was performed both before and after administration of intravenous contrast. CONTRAST:  62mL MULTIHANCE GADOBENATE DIMEGLUMINE 529 MG/ML IV SOLN COMPARISON:  None. FINDINGS: The hip and knee joints are grossly maintained. No definite findings for septic arthritis or osteomyelitis. There is extensive pyomyositis involving the posterior compartment of the thigh. Multiple large abscesses surrounding the  hamstring muscles. This also rim enhancing fluid/ abscess along the entire course of the sciatic/femoral nerve which is markedly inflamed. Diffuse subcutaneous soft tissue swelling/ edema involving both lower extremities, right greater than left consistent with cellulitis. I do not see any significant findings below-the-knee other than cellulitis. No myofasciitis or pyomyositis. No osteomyelitis or septic arthritis. IMPRESSION: 1. Extensive abscesses involving the posterior compartment of the thigh along with myofasciitis and cellulitis. There is also abscess all along the course of the sciatic/femoral nerve which is thickened and inflamed. 2. No findings for septic arthritis or osteomyelitis. 3. Cellulitis below the knee but no myofasciitis or pyomyositis. These results will be called to the ordering clinician or representative by the Radiologist Assistant, and communication documented in the PACS or zVision Dashboard. Electronically Signed   By: Marijo Sanes M.D.   On: 10/31/2016 08:24   Mr Femur Right W Wo Contrast  Result Date: 10/31/2016 CLINICAL DATA:  Severe right lower extremity pain and swelling. History of diskitis and osteomyelitis. EXAM: MRI OF THE RIGHT FEMUR WITHOUT AND WITH CONTRAST; MRI OF LOWER RIGHT EXTREMITY WITHOUT AND WITH CONTRAST TECHNIQUE: Multiplanar, multisequence MR imaging of the right lower extremity was performed both before and after administration of intravenous contrast. CONTRAST:  34mL MULTIHANCE GADOBENATE DIMEGLUMINE 529 MG/ML IV SOLN COMPARISON:  None. FINDINGS: The hip and knee joints are grossly maintained. No definite findings for septic arthritis or osteomyelitis. There is extensive pyomyositis involving the posterior compartment of the thigh. Multiple large abscesses surrounding the hamstring muscles. This also rim enhancing fluid/ abscess along the entire course of the sciatic/femoral nerve which is markedly inflamed. Diffuse subcutaneous soft tissue swelling/ edema  involving both lower extremities, right greater than left consistent with cellulitis. I do not see any significant findings below-the-knee other than cellulitis. No myofasciitis or pyomyositis. No osteomyelitis or septic arthritis. IMPRESSION: 1. Extensive abscesses involving the posterior compartment of the thigh along with myofasciitis and cellulitis. There is also abscess all along the course of the sciatic/femoral nerve which is thickened and inflamed. 2. No findings for septic arthritis or osteomyelitis. 3. Cellulitis below the knee but no myofasciitis or pyomyositis. These results will be called to the ordering clinician or representative by the Radiologist Assistant, and communication documented in the PACS or zVision Dashboard. Electronically Signed   By: Marijo Sanes M.D.   On: 10/31/2016 08:24   Dg Chest Port 1 View  Result Date: 11/01/2016 CLINICAL DATA:  Life support line placement. History of hepatitis-C and epidural abscess. EXAM: PORTABLE CHEST 1 VIEW COMPARISON:  Chest radiograph October 29, 2016 FINDINGS: LEFT internal jugular central venous catheter distal tip projects at cavoatrial junction. Endotracheal tube tip projects 4 cm above the carina. Nasogastric tube tip projects in proximal stomach. The cardiac silhouette is mildly enlarged. Low inspiratory examination with crowded, mildly engorged  vascular markings and mild interstitial prominence. No pleural effusion. No focal consolidation. No pneumothorax. Large body habitus. Osseous structures are nonsuspicious. IMPRESSION: LEFT internal jugular central venous catheter distal tip projects at cavoatrial junction. Endotracheal tube tip projects 4 cm above the carina. Nasogastric tube tip projects in proximal stomach. Stable cardiomegaly and mild interstitial prominence. Electronically Signed   By: Elon Alas M.D.   On: 11/01/2016 01:23     Studies: RUQ u/s 2/01 >> diffuse hepatic echogenicity MR Rt leg 2/02 >> abscess posterior  compartment of thigh, along course of sciatic/femoral nerve, cellulitis below the knee  Antibiotics: Zosyn 2/01 >> 2/01 Vancomycin 2/01 >> Cefepime 2/02 >>  Cultures: Blood 2/01 >>  Lines/tubes: ETT 2/3 >  TLC  2/3 >  Events: 2/01 Admit 2/03 Transfer to ICU, start precedex  Summary: 36 yo with L4-5 septic arthritis, paraspinal abscess, Rt thigh abscess, and Rt lower leg cellulitis.  He has hx of ETOH with acute alcoholic hepatitis, and hepatitis C.  He developed altered mental status from delirium tremens.  Assessment/plan:  Acute hypoxemic respiratory failure 2/2 unable to protect airway 2/2 DTs. - cont vent support.  - going to OR for spinal abscess.   Encephalopathy 2/2 Delirium tremens. Possible Drug abuse. Also related to hepatic encephalopathy.  - fentanyl drip, versed pushes.  - RASS goal 0 to -1 - thiamine, folic acid - may need precedex.  - cont lactulose  Acute alcoholic hepatitis. Hx of Hepatitis C. - Solumedrol while NPO - f/u LFTs - GI following - lactulose to keep 3-5 BM  L4-5 septic arthritis, paraspinal abscess, Rt thigh abscess, Rt lower leg cellulitis. - plan for surgery 2/04 - Abx per ID  Coagulopathy, thrombocytopenia 2nd to liver disease. S/P Vit K and 4u FFP - f/u CBC, INR post op  DVT prophylaxis - SCDs SUP - Protonix Nutrition - NPO. Start TF post op.  Goals of care - full code  Updated pt's mother at bedside.      I spent  30   minutes of Critical Care time with this patient today.     Monica Becton, MD 11/01/2016, 12:23 PM [facility] Pager [phone] After 3 pm or if no answer, call [phone]

## 2016-11-01 NOTE — Progress Notes (Signed)
eLink Physician-Brief Progress Note Patient Name: [name] DOB: 1981-06-24 MRN: [id number]   Date of Service  11/01/2016  HPI/Events of Note  Dr. Corrie Dandy note states to repeat CBC and PT/INR later today.  No labs ordered.   eICU Interventions  Will order CBC with platelets and PT/INR at 6 PM.     Intervention Category Intermediate Interventions: Coagulopathy - evaluation and management  [name] 11/01/2016, 5:50 PM

## 2016-11-01 NOTE — Progress Notes (Addendum)
Pt arrived to the unit via rapid response from 3W. Pt was agitated and confused. He was verbally abusive to staff and attempted to kick staff. Pt started on Precedex drip per MD orders. Pt unchanged on Precedex drip. 5mg  Haldol and 4mg  Ativan given.Pt still agitated, confused, and violent toward staff. Respirations were in the 40s. MD notified. Per MD, will intubated. Family notified.

## 2016-11-01 NOTE — Brief Op Note (Signed)
10/29/2016 - 11/01/2016  12:45 PM  PATIENT:  [name]  36 y.o. male  PRE-OPERATIVE DIAGNOSIS:  right posterior thigh abscess, deep  POST-OPERATIVE DIAGNOSIS:  right posterior thigh abscess, deep  PROCEDURE:  Incision and Drainage and Irrigation and debridement of right posterior thigh Abscess  SURGEON:  Surgeon(s) and Role:  Netta Cedars, MD  PHYSICIAN ASSISTANT:   ASSISTANTS: None  ANESTHESIA:   general  EBL:  Total I/O In: 2061.6 [I.V.:400; Blood:1661.6] Out: 835 [Urine:435; Blood:400]  BLOOD ADMINISTERED:4 units FFP, 2 Platets  DRAINS: two drains, Large fluted 5mm JP posterior medial - sewn in, Medium HV posterior lateral sewn-in  LOCAL MEDICATIONS USED:  NONE  SPECIMEN:  Source of Specimen:  Right posterior thigh abscess  DISPOSITION OF SPECIMEN:  micro  COUNTS:  YES  TOURNIQUET:  * No tourniquets in log *  DICTATION: .Other Dictation: Dictation Number [id number]  PLAN OF CARE: Admit to inpatient   PATIENT DISPOSITION:  ICU - intubated and hemodynamically stable.   Delay start of Pharmacological VTE agent (>24hrs) due to surgical blood loss or risk of bleeding: no

## 2016-11-01 NOTE — Brief Op Note (Signed)
10/29/2016 - 11/01/2016  1:36 PM  PATIENT:  [name]  36 y.o. male  PRE-OPERATIVE DIAGNOSIS:  right posterior thigh abscess/ L4-L5 spine infection  POST-OPERATIVE DIAGNOSIS:  right posterior thigh abscess/L4-L5 spine infection  PROCEDURE:  Procedure(s): DECOMPRESSIVE POSTERIOR LUMBAR LAMINECTOMY LEVEL 4-5 (N/A) IRRIGATION AND DEBRIDEMENT ABSCESS RIGHT THIGH (Right)  SURGEON:  Surgeon(s) and Role:    * Netta Cedars, MD    * Melina Schools, MD - Primary  PHYSICIAN ASSISTANT:   ASSISTANTS: none   ANESTHESIA:   general  EBL:  Total I/O In: 3811.6 [I.V.:1900; Blood:1661.6; IV Piggyback:250] Out: 860 [Urine:460; Blood:400]  BLOOD ADMINISTERED:none  DRAINS: 1 drain in the back   LOCAL MEDICATIONS USED:  NONE  SPECIMEN:  Source of Specimen:  L4/5 disc  DISPOSITION OF SPECIMEN:  micro  COUNTS:  YES  TOURNIQUET:  * No tourniquets in log *  DICTATION: .Other Dictation: Dictation Number [id number]  PLAN OF CARE: Admit to inpatient   PATIENT DISPOSITION:  ICU - intubated and hemodynamically stable.

## 2016-11-01 NOTE — Progress Notes (Signed)
Pt temp 93.4 F rectal. RN notified

## 2016-11-01 NOTE — Anesthesia Preprocedure Evaluation (Addendum)
Anesthesia Evaluation  Patient identified by MRN, date of birth, ID band Patient unresponsive    Reviewed: Allergy & Precautions, NPO status , Patient's Chart, lab work & pertinent test results  History of Anesthesia Complications Negative for: history of anesthetic complications  Airway Mallampati: Intubated       Dental no notable dental hx.    Pulmonary Current Smoker,    Pulmonary exam normal        Cardiovascular negative cardio ROS Normal cardiovascular exam     Neuro/Psych negative neurological ROS  negative psych ROS   GI/Hepatic negative GI ROS, (+) Cirrhosis     substance abuse  alcohol use, Hepatitis -, C  Endo/Other  negative endocrine ROS  Renal/GU negative Renal ROS  negative genitourinary   Musculoskeletal negative musculoskeletal ROS (+)   Abdominal   Peds negative pediatric ROS (+)  Hematology negative hematology ROS (+)   Anesthesia Other Findings   Reproductive/Obstetrics negative OB ROS                            Anesthesia Physical Anesthesia Plan  ASA: IV  Anesthesia Plan: General   Post-op Pain Management:    Induction: Intravenous  Airway Management Planned: Oral ETT  Additional Equipment:   Intra-op Plan:   Post-operative Plan: Possible Post-op intubation/ventilation  Informed Consent: I have reviewed the patients History and Physical, chart, labs and discussed the procedure including the risks, benefits and alternatives for the proposed anesthesia with the patient or authorized representative who has indicated his/her understanding and acceptance.     Plan Discussed with: CRNA, Anesthesiologist and Surgeon  Anesthesia Plan Comments:        Anesthesia Quick Evaluation

## 2016-11-02 ENCOUNTER — Encounter (HOSPITAL_COMMUNITY): Payer: Self-pay | Admitting: Orthopedic Surgery

## 2016-11-02 ENCOUNTER — Encounter (HOSPITAL_COMMUNITY): Payer: BLUE CROSS/BLUE SHIELD

## 2016-11-02 DIAGNOSIS — Z9911 Dependence on respirator [ventilator] status: Secondary | ICD-10-CM

## 2016-11-02 DIAGNOSIS — Z9889 Other specified postprocedural states: Secondary | ICD-10-CM

## 2016-11-02 DIAGNOSIS — F1010 Alcohol abuse, uncomplicated: Secondary | ICD-10-CM

## 2016-11-02 DIAGNOSIS — R451 Restlessness and agitation: Secondary | ICD-10-CM

## 2016-11-02 DIAGNOSIS — M4646 Discitis, unspecified, lumbar region: Secondary | ICD-10-CM

## 2016-11-02 DIAGNOSIS — M4626 Osteomyelitis of vertebra, lumbar region: Secondary | ICD-10-CM

## 2016-11-02 LAB — PREPARE PLATELET PHERESIS
UNIT DIVISION: 0
Unit division: 0

## 2016-11-02 LAB — PREPARE FRESH FROZEN PLASMA
UNIT DIVISION: 0
UNIT DIVISION: 0
Unit division: 0
Unit division: 0

## 2016-11-02 LAB — BASIC METABOLIC PANEL
ANION GAP: 6 (ref 5–15)
BUN: 19 mg/dL (ref 6–20)
CO2: 24 mmol/L (ref 22–32)
Calcium: 7.8 mg/dL — ABNORMAL LOW (ref 8.9–10.3)
Chloride: 107 mmol/L (ref 101–111)
Creatinine, Ser: 0.83 mg/dL (ref 0.61–1.24)
GFR calc Af Amer: >60 mL/min (ref >60)
GLUCOSE: 117 mg/dL — AB (ref 65–99)
POTASSIUM: 4.2 mmol/L (ref 3.5–5.1)
SODIUM: 137 mmol/L (ref 135–145)

## 2016-11-02 LAB — CBC
HCT: 25.3 % — ABNORMAL LOW (ref 39.0–52.0)
Hemoglobin: 8.7 g/dL — ABNORMAL LOW (ref 13.0–17.0)
MCH: 33.5 pg (ref 26.0–34.0)
MCHC: 34.4 g/dL (ref 30.0–36.0)
MCV: 97.3 fL (ref 78.0–100.0)
PLATELETS: 107 10*3/uL — AB (ref 150–400)
RBC: 2.6 MIL/uL — AB (ref 4.22–5.81)
RDW: 17.6 % — AB (ref 11.5–15.5)
WBC: 13.5 10*3/uL — AB (ref 4.0–10.5)

## 2016-11-02 LAB — POCT I-STAT 7, (LYTES, BLD GAS, ICA,H+H)
ACID-BASE DEFICIT: 2 mmol/L (ref 0.0–2.0)
Bicarbonate: 23.3 mmol/L (ref 20.0–28.0)
Calcium, Ion: 0.98 mmol/L — ABNORMAL LOW (ref 1.15–1.40)
HEMATOCRIT: 27 % — AB (ref 39.0–52.0)
Hemoglobin: 9.2 g/dL — ABNORMAL LOW (ref 13.0–17.0)
O2 SAT: 98 %
PCO2 ART: 35.9 mmHg (ref 32.0–48.0)
PH ART: 7.409 (ref 7.350–7.450)
PO2 ART: 96 mmHg (ref 83.0–108.0)
Patient temperature: 34.5
Potassium: 4.2 mmol/L (ref 3.5–5.1)
Sodium: 137 mmol/L (ref 135–145)
TCO2: 25 mmol/L (ref 0–100)

## 2016-11-02 LAB — HEPATIC FUNCTION PANEL
ALT: 67 U/L — AB (ref 17–63)
AST: 175 U/L — AB (ref 15–41)
Albumin: 2 g/dL — ABNORMAL LOW (ref 3.5–5.0)
Alkaline Phosphatase: 123 U/L (ref 38–126)
BILIRUBIN DIRECT: 6.1 mg/dL — AB (ref 0.1–0.5)
BILIRUBIN INDIRECT: 5.1 mg/dL — AB (ref 0.3–0.9)
TOTAL PROTEIN: 5.4 g/dL — AB (ref 6.5–8.1)
Total Bilirubin: 11.2 mg/dL — ABNORMAL HIGH (ref 0.3–1.2)

## 2016-11-02 LAB — PHOSPHORUS: Phosphorus: 4.7 mg/dL — ABNORMAL HIGH (ref 2.5–4.6)

## 2016-11-02 LAB — GLUCOSE, CAPILLARY
GLUCOSE-CAPILLARY: 104 mg/dL — AB (ref 65–99)
Glucose-Capillary: 80 mg/dL (ref 65–99)
Glucose-Capillary: 115 mg/dL — ABNORMAL HIGH (ref 65–99)
Glucose-Capillary: 113 mg/dL — ABNORMAL HIGH (ref 65–99)

## 2016-11-02 LAB — AMMONIA: Ammonia: 51 umol/L — ABNORMAL HIGH (ref 9–35)

## 2016-11-02 LAB — MAGNESIUM: Magnesium: 2.4 mg/dL (ref 1.7–2.4)

## 2016-11-02 MED ORDER — PRO-STAT SUGAR FREE PO LIQD
60.0000 mL | Freq: Four times a day (QID) | ORAL | Status: DC
  Administered 2016-11-02 – 2016-11-03 (×6): 60 mL
  Filled 2016-11-02 (×9): qty 60

## 2016-11-02 MED ORDER — DEXTROSE 5 % IV SOLN
2.0000 g | Freq: Three times a day (TID) | INTRAVENOUS | Status: DC
  Filled 2016-11-02 (×2): qty 2

## 2016-11-02 MED ORDER — VITAL HIGH PROTEIN PO LIQD: 1000.0000 mL | ORAL | Status: DC

## 2016-11-02 MED ORDER — LORAZEPAM 2 MG/ML IJ SOLN
2.0000 mg | Freq: Three times a day (TID) | INTRAMUSCULAR | Status: DC
  Administered 2016-11-02 – 2016-11-03 (×3): 2 mg via INTRAVENOUS
  Filled 2016-11-02 (×3): qty 1

## 2016-11-02 MED ORDER — PRO-STAT SUGAR FREE PO LIQD
30.0000 mL | Freq: Two times a day (BID) | ORAL | Status: DC
  Filled 2016-11-02: qty 30

## 2016-11-02 MED ORDER — VITAL HIGH PROTEIN PO LIQD
1000.0000 mL | ORAL | Status: DC
  Administered 2016-11-02: 1000 mL
  Administered 2016-11-04: 07:00:00

## 2016-11-02 MED ORDER — DEXMEDETOMIDINE HCL IN NACL 400 MCG/100ML IV SOLN
0.4000 ug/kg/h | INTRAVENOUS | Status: DC
  Administered 2016-11-02: 0.5 ug/kg/h via INTRAVENOUS
  Administered 2016-11-02: 1 ug/kg/h via INTRAVENOUS
  Administered 2016-11-02: 0.5 ug/kg/h via INTRAVENOUS
  Administered 2016-11-02 (×2): 1 ug/kg/h via INTRAVENOUS
  Administered 2016-11-03 (×2): 0.4 ug/kg/h via INTRAVENOUS
  Administered 2016-11-03: 0.8 ug/kg/h via INTRAVENOUS
  Administered 2016-11-03 (×3): 0.4 ug/kg/h via INTRAVENOUS
  Administered 2016-11-04: 0.8 ug/kg/h via INTRAVENOUS
  Administered 2016-11-04: 1.2 ug/kg/h via INTRAVENOUS
  Filled 2016-11-02: qty 100
  Filled 2016-11-02 (×4): qty 50
  Filled 2016-11-02: qty 100
  Filled 2016-11-02 (×5): qty 50
  Filled 2016-11-02: qty 100
  Filled 2016-11-02 (×2): qty 50

## 2016-11-02 MED ORDER — DEXTROSE 5 % IV SOLN
2.0000 g | INTRAVENOUS | Status: DC
  Administered 2016-11-02 – 2016-11-11 (×10): 2 g via INTRAVENOUS
  Filled 2016-11-02 (×11): qty 2

## 2016-11-02 NOTE — Progress Notes (Addendum)
[facility] Progress Note    Since last GI note: S/p decompressive L4/L5 laminectomy and right thigh abscess debridement yesterday.  Objective: Vital signs in last 24 hours: Temp:  [93.4 F (34.1 C)-99.3 F (37.4 C)] 99.3 F (37.4 C) (02/05 0441) Pulse Rate:  [56-115] 76 (02/05 0600) Resp:  [18-25] 20 (02/05 0522) BP: (86-123)/(45-73) 96/59 (02/05 0600) SpO2:  [96 %-100 %] 99 % (02/05 0721) Arterial Line BP: (87-127)/(34-91) 92/74 (02/05 0600) FiO2 (%):  [40 %] 40 % (02/05 0721) Last BM Date: 10/26/16 General: intubated, sedated Heart: regular rate and rythm Abdomen: soft, non-tender, non-distended, normal bowel sounds   Lab Results:  Recent Labs  10/31/16 0416 11/01/16 1848 11/02/16 0153  WBC 17.6* 12.1* 13.5*  HGB 11.8* 8.7* 8.7*  PLT 57* 83* 107*  MCV 95.0 96.9 97.3    Recent Labs  10/31/16 0416 11/01/16 0338 11/02/16 0153  NA 133* 132* 137  K 3.6 4.5 4.2  CL 102 104 107  CO2 _0 GLUCOSE 80 150* 117*  BUN _1 CREATININE 0.73 0.67 0.83  CALCIUM 7.7* 7.7* 7.8*    Recent Labs  10/31/16 0416 11/01/16 0338 11/02/16 0153  PROT 6.4* 6.1* 5.4*  ALBUMIN 1.8* 1.6* 2.0*  AST 268* 263* 175*  ALT 78* 91* 67*  ALKPHOS 212* 166* 123  BILITOT Jose.0* 15.2* 11.2*  BILIDIR  --   --  6.1*  IBILI  --   --  5.1*    Recent Labs  11/01/16 0918 11/01/16 1848  INR 1.81 1.88      Medications: Scheduled Meds: . sodium chloride   Intravenous Once  . ceFEPime (MAXIPIME) IV  2 g Intravenous Q12H  . chlorhexidine gluconate (MEDLINE KIT)  15 mL Mouth Rinse BID  . Chlorhexidine Gluconate Cloth  6 each Topical Daily  . folic acid  1 mg Per Tube Daily  . lactulose  30 g Oral TID  . mouth rinse  15 mL Mouth Rinse QID  . methylPREDNISolone (SOLU-MEDROL) injection  20 mg Intravenous Q12H  . multivitamin with minerals  1 tablet Per Tube Daily  . pantoprazole sodium  40 mg Per Tube Daily  . sodium chloride flush  10-40 mL Intracatheter Q12H   . sodium chloride flush  3 mL Intravenous Q12H  . thiamine  100 mg Per Tube Daily  . vancomycin  1,000 mg Intravenous Q8H   Continuous Infusions: . sodium chloride    . sodium chloride 75 mL/hr at 11/01/16 1602  . fentaNYL infusion INTRAVENOUS 75 mcg/hr (11/02/16 0010)  . lactated ringers    . propofol (DIPRIVAN) infusion 40 mcg/kg/min (11/02/16 0553)   PRN Meds:.fentaNYL, midazolam, ondansetron **OR** ondansetron (ZOFRAN) IV, sodium chloride flush, sodium chloride flush   Assessment/Plan: 36 y.o. Guerra with severe alcoholic hepatitis and multiple sites of abscess, infection  S/p spinal surgery and right thigh debridement yesterday.  Total bilirubin a bit improved and INR about the same in past 12 hours since surgery.  Hopefully a good sign that his liver will not decompensate further.  Continue supportive care in ICU with IV antibiotics, resp support. He has good bowel sounds and so a trial of enteral feeding is reasonable if he is going to be intubated much longer.  Given infections (thigh, spine) will stop steroids for now; the risk may outweigh potential benefits for treatment of alc hep.    Milus Banister, MD  11/02/2016, 7:57 AM [facility] Pager [phone]

## 2016-11-02 NOTE — Op Note (Signed)
NAME:  [name], [name]             ACCOUNT NO.:  [id number]  MEDICAL RECORD NO.:  [id number]  LOCATION:                                 FACILITY:  PHYSICIAN:  Jaevon Paras D. Rolena Guerra, M.D. DATE OF BIRTH:  March 12, 1981  DATE OF PROCEDURE:  11/01/2016 DATE OF DISCHARGE:                              OPERATIVE REPORT   PREOPERATIVE DIAGNOSES:  L4-5 epidural abscess and facet abscess and right posterior thigh abscess.  POSTOPERATIVE DIAGNOSES:  L4-5 epidural abscess and facet abscess and right posterior thigh abscess.  OPERATIVE PROCEDURES: 1. I and D of right posterior thigh abscess. 2. Gill decompression, right side, L4-5 with I and D of abscess.  COMPLICATIONS:  None.  CONDITION:  Stable.  SURGEON:  Operative surgeon for the thigh abscess is Doran Heater. Veverly Fells, M.D., please refer to his dictation for specifics.  HISTORY:  This is a very pleasant, 36 year old gentleman who has had progressive debilitating back and radicular right leg pain.  Imaging studies demonstrated an L4-5 facet abscess with extension into the epidural space on the ventral surface of the thecal sac.  After discussing treatment options, he was ultimately admitted to the hospital.  Once he was medically stable, we elected to go to the OR for formal treatment.  DESCRIPTION OF PROCEDURE:  The patient was brought to the operating room.  He has already been intubated in the ICU for delirium tremens (DTs).  The patient was turned prone onto the Wilson frame.  All bony prominences were well padded and the lumbar and right leg were prepped and draped in a standard fashion.  Time-out was taken confirming the patient, procedure, and all other pertinent important data.  Please refer to Dr. Netta Cedars' notes for specifics on the posterior thigh abscess I and D.  With respect to the spine, Tuohy needles were placed into the back and an x-ray was taken for localization.  A midline incision was made.  Sharp dissection was  carried out down to the deep fascia.  The deep fascia was sharply incised and I stripped the paraspinal muscles bilaterally to expose the L4 and L5 spinous process and the L4-5 facet complex.  Penfield 4 was placed on the L4 lamina and I took a second x-ray confirming I was at the appropriate level.  I then removed the majority of the L4 spinous process and the lamina using a double XL rongeur.  An osteotome was used to resect the inferior L4 facet.  At this point, I then noticed there was a grayish discolored facet capsule.  There was no frank fluid cavity.  I did take intraoperative cultures.  I then dissected through the ligamentum flavum and began resecting the flavum itself that also showed signs of discoloration.  There was obvious compression and irritation to the thecal sac.  I removed this with a 2 and 3 mm Kerrison punch.  I then removed the medial overhanging osteophytes from the L5 superior facet which allowed me to palpate the L5 pedicle.  I then traced the L5 nerve root into the foramen and removed the overhanging osteophyte.  I then went superiorly, performing a near-total laminectomy on that right side of L4.  I resected the  entire pars of L4 and identified the L4 nerve root.  At this point, I could now see the posterolateral aspect of the disk space.  I then protected the thecal sac, performed an annulotomy, and using a micropituitary rongeur, I removed disk material to send for cultures.  At this point, with my [facility], I could freely pass underneath the thecal sac.  Along the entire ventral surface of the sac, there was no purulent material that I could express.  I then could go down the L5 nerve root into the foramen and along the lateral recess medial to the L4 pedicle.  At this point, I had adequately decompressed the area.  I then irrigated with 5 L of fluid.  Hemostasis was then obtained using bipolar electrocautery and FloSeal.  I then irrigated out the  FloSeal and placed the antibiotic-impregnated beads into the wound. I placed a deep drain and then closed in a layered fashion with interrupted #1 Vicryl sutures, 2-0 Vicryl sutures, and a 3-0 Monocryl. Steri-Strips and Aquacel were applied.  The patient was then ultimately transferred back to the ICU, intubated.  He was hemodynamically stable. All appropriate counts were correct.     [name], M.D.   ______________________________ [name], M.D.    DDB/MEDQ  D:  11/01/2016  T:  11/02/2016  Job:  [id number]  cc:   [name], M.D.'s Office

## 2016-11-02 NOTE — Progress Notes (Signed)
Subjective: Intubated this morning with intermittent agitation. He underwent debridement yesterday of L-spine and R thigh.    Antibiotics:  Anti-infectives    Start     Dose/Rate Route Frequency Ordered Stop   11/02/16 2200  ceFEPIme (MAXIPIME) 2 g in dextrose 5 % 50 mL IVPB     2 g 100 mL/hr over 30 Minutes Intravenous Every 8 hours 11/02/16 1452     11/01/16 2200  ceFEPIme (MAXIPIME) 2 g in dextrose 5 % 50 mL IVPB  Status:  Discontinued     2 g 100 mL/hr over 30 Minutes Intravenous Every 12 hours 11/01/16 1520 11/02/16 1452   11/01/16 1800  vancomycin (VANCOCIN) IVPB 1000 mg/200 mL premix     1,000 mg 200 mL/hr over 60 Minutes Intravenous Every 8 hours 11/01/16 1520     11/01/16 1346  vancomycin (VANCOCIN) powder  Status:  Discontinued       As needed 11/01/16 1346 11/01/16 1347   10/30/16 1000  ceFEPIme (MAXIPIME) 2 g in dextrose 5 % 50 mL IVPB  Status:  Discontinued     2 g 100 mL/hr over 30 Minutes Intravenous Every 12 hours 10/30/16 0939 11/01/16 1515   10/29/16 2200  vancomycin (VANCOCIN) IVPB 1000 mg/200 mL premix  Status:  Discontinued     1,000 mg 200 mL/hr over 60 Minutes Intravenous Every 8 hours 10/29/16 1412 11/01/16 1515   10/29/16 2200  piperacillin-tazobactam (ZOSYN) IVPB 3.375 g  Status:  Discontinued     3.375 g 12.5 mL/hr over 240 Minutes Intravenous Every 8 hours 10/29/16 1412 10/29/16 1430   10/29/16 1300  vancomycin (VANCOCIN) IVPB 1000 mg/200 mL premix     1,000 mg 200 mL/hr over 60 Minutes Intravenous  Once 10/29/16 1237 10/29/16 1519   10/29/16 1300  piperacillin-tazobactam (ZOSYN) IVPB 3.375 g     3.375 g 100 mL/hr over 30 Minutes Intravenous  Once 10/29/16 1237 10/29/16 1340      Medications: Scheduled Meds: . sodium chloride   Intravenous Once  . ceFEPime (MAXIPIME) IV  2 g Intravenous Q8H  . chlorhexidine gluconate (MEDLINE KIT)  15 mL Mouth Rinse BID  . Chlorhexidine Gluconate Cloth  6 each Topical Daily  . feeding supplement  (PRO-STAT SUGAR FREE 64)  60 mL Per Tube QID  . feeding supplement (VITAL HIGH PROTEIN)  1,000 mL Per Tube Q24H  . folic acid  1 mg Per Tube Daily  . lactulose  30 g Oral TID  . LORazepam  2 mg Intravenous Q8H  . mouth rinse  15 mL Mouth Rinse QID  . multivitamin with minerals  1 tablet Per Tube Daily  . pantoprazole sodium  40 mg Per Tube Daily  . sodium chloride flush  10-40 mL Intracatheter Q12H  . sodium chloride flush  3 mL Intravenous Q12H  . thiamine  100 mg Per Tube Daily  . vancomycin  1,000 mg Intravenous Q8H   Continuous Infusions: . sodium chloride    . sodium chloride 75 mL/hr at 11/01/16 1602  . dexmedetomidine 0.5 mcg/kg/hr (11/02/16 1618)  . fentaNYL infusion INTRAVENOUS 400 mcg/hr (11/02/16 1254)  . lactated ringers    . propofol (DIPRIVAN) infusion Stopped (11/02/16 1321)   PRN Meds:.fentaNYL, midazolam, ondansetron **OR** ondansetron (ZOFRAN) IV, sodium chloride flush, sodium chloride flush    Objective: Weight change:   Intake/Output Summary (Last 24 hours) at 11/02/16 1726 Last data filed at 11/02/16 1600  Gross per 24 hour  Intake  2919.97 ml  Output             1925 ml  Net           994.97 ml   Blood pressure 122/75, pulse 63, temperature 99.1 F (37.3 C), temperature source Oral, resp. rate 20, height _0  (1.803 m), weight 266 lb 1.6 oz (120.7 kg), SpO2 100 %. Temp:  [97.4 F (36.3 C)-99.3 F (37.4 C)] 99.1 F (37.3 C) (02/05 1506) Pulse Rate:  [57-120] 63 (02/05 1700) Resp:  [18-25] 20 (02/05 1612) BP: (86-124)/(48-75) 122/75 (02/05 1700) SpO2:  [95 %-100 %] 100 % (02/05 1700) Arterial Line BP: (87-132)/(34-91) 132/63 (02/05 1700) FiO2 (%):  [40 %] 40 % (02/05 1612)  Physical Exam: General: Sedated, ETT in place HEENT: icteric sclera, pupils reactive to light and accommodation CVS regular rate, normal rhythm,  no murmur rubs or gallops Chest: clear to auscultation bilaterally in the anterior lung fields, no wheezing, rales or  rhonchi Abdomen: soft nontender, nondistended, normal bowel sounds, Extremities: RLE wrapped in bandage Skin: no rashes   CBC: _1 (wbc3,Hgb:3,Hct:3,Plt:3,INR:3APTT:3)@   BMET  Recent Labs  11/01/16 0338 11/01/16 1212 11/02/16 0153  NA 132* 137 137  K 4.5 4.2 4.2  CL 104  --  107  CO2 25  --  24  GLUCOSE 150*  --  117*  BUN 11  --  19  CREATININE 0.67  --  0.83  CALCIUM 7.7*  --  7.8*    Liver Panel   Recent Labs  11/01/16 0338 11/02/16 0153  PROT 6.1* 5.4*  ALBUMIN 1.6* 2.0*  AST 263* 175*  ALT 91* 67*  ALKPHOS 166* 123  BILITOT 15.2* 11.2*  BILIDIR  --  6.1*  IBILI  --  5.1*    Sedimentation Rate No results for input(s): ESRSEDRATE in the last 72 hours. C-Reactive Protein No results for input(s): CRP in the last 72 hours.  Micro Results: Recent Results (from the past 720 hour(s))  Culture, blood (routine x 2)     Status: None (Preliminary result)   Collection Time: 10/29/16 12:52 PM  Result Value Ref Range Status   Specimen Description BLOOD RIGHT ANTECUBITAL  Final   Special Requests BOTTLES DRAWN AEROBIC AND ANAEROBIC 5 ML  Final   Culture   Final    NO GROWTH 4 DAYS Performed at [facility], [address], [place], [place] [address]    Report Status PENDING  Incomplete  Culture, blood (routine x 2)     Status: None (Preliminary result)   Collection Time: 10/29/16 12:56 PM  Result Value Ref Range Status   Specimen Description BLOOD LEFT HAND  Final   Special Requests IN PEDIATRIC BOTTLE 2 ML  Final   Culture   Final    NO GROWTH 4 DAYS Performed at [facility], [place] [address] [place],  [address]    Report Status PENDING  Incomplete  MRSA PCR Screening     Status: None   Collection Time: 10/29/16  9:30 PM  Result Value Ref Range Status   MRSA by PCR NEGATIVE NEGATIVE Final    Comment:        The GeneXpert MRSA Assay (FDA approved for NASAL specimens only), is one component of a comprehensive MRSA  colonization surveillance program. It is not intended to diagnose MRSA infection nor to guide or monitor treatment for MRSA infections.   Aerobic/Anaerobic Culture (surgical/deep wound)     Status: None (Preliminary result)   Collection  Time: 11/01/16 11:23 AM  Result Value Ref Range Status   Specimen Description ABSCESS RIGHT THIGH  Final   Special Requests POSTERIOR THIGH  Final   Gram Stain   Final    FEW WBC PRESENT, PREDOMINANTLY PMN RARE GRAM POSITIVE COCCI IN PAIRS    Culture PENDING  Incomplete   Report Status PENDING  Incomplete  Aerobic/Anaerobic Culture (surgical/deep wound)     Status: None (Preliminary result)   Collection Time: 11/01/16 11:42 AM  Result Value Ref Range Status   Specimen Description ABSCESS RIGHT BACK  Final   Special Requests   Final    POSTERIOR LATERAL LUMBAR L4 L5 ABSCESS POF CEFATIN AND VANC   Gram Stain   Final    MODERATE WBC PRESENT, PREDOMINANTLY PMN NO ORGANISMS SEEN    Culture PENDING  Incomplete   Report Status PENDING  Incomplete  Aerobic/Anaerobic Culture (surgical/deep wound)     Status: None (Preliminary result)   Collection Time: 11/01/16 12:01 PM  Result Value Ref Range Status   Specimen Description WOUND BACK  Final   Special Requests LUMBAR L4 L5 FACET SPEC C POF CEFTIN AND VANC  Final   Gram Stain   Final    MODERATE WBC PRESENT, PREDOMINANTLY PMN NO ORGANISMS SEEN    Culture PENDING  Incomplete   Report Status PENDING  Incomplete  Aerobic/Anaerobic Culture (surgical/deep wound)     Status: None (Preliminary result)   Collection Time: 11/01/16  1:47 PM  Result Value Ref Range Status   Specimen Description TISSUE  Final   Special Requests   Final    L4 L5 DISC MATERIAL SPECIMEN CUP D PATIENT ON FOLLOWING  CEFTIN AND VANCOMYCIN    Gram Stain   Final    RARE WBC PRESENT, PREDOMINANTLY MONONUCLEAR NO ORGANISMS SEEN    Culture NO GROWTH 1 DAY  Final   Report Status PENDING  Incomplete    Studies/Results: Dg Lumbar  Spine 2-3 Views  Result Date: 11/01/2016 CLINICAL DATA:  Post lumbar laminectomy. EXAM: LUMBAR SPINE - 2-3 VIEW COMPARISON:  None. FINDINGS: Surgical probe positioned over the posterior elements at the level of the L4-5 disc space. IMPRESSION: Surgical probe with tip overlying the posterior elements at the L4-5 disc space level. Discussed with Dr. Rolena Infante in the [facility]. Electronically Signed   By: Franki Cabot M.D.   On: 11/01/2016 12:11   Dg Chest Port 1 View  Result Date: 11/01/2016 CLINICAL DATA:  Life support line placement. History of hepatitis-C and epidural abscess. EXAM: PORTABLE CHEST 1 VIEW COMPARISON:  Chest radiograph October 29, 2016 FINDINGS: LEFT internal jugular central venous catheter distal tip projects at cavoatrial junction. Endotracheal tube tip projects 4 cm above the carina. Nasogastric tube tip projects in proximal stomach. The cardiac silhouette is mildly enlarged. Low inspiratory examination with crowded, mildly engorged vascular markings and mild interstitial prominence. No pleural effusion. No focal consolidation. No pneumothorax. Large body habitus. Osseous structures are nonsuspicious. IMPRESSION: LEFT internal jugular central venous catheter distal tip projects at cavoatrial junction. Endotracheal tube tip projects 4 cm above the carina. Nasogastric tube tip projects in proximal stomach. Stable cardiomegaly and mild interstitial prominence. Electronically Signed   By: Elon Alas M.D.   On: 11/01/2016 01:23   Dg Abd Portable 1v  Result Date: 11/01/2016 CLINICAL DATA:  Evaluate OG tube. EXAM: PORTABLE ABDOMEN - 1 VIEW COMPARISON:  None. FINDINGS: The OG tube terminates in the left upper quadrant. Left basilar opacity and probable small effusion identified. No other acute  abnormalities. IMPRESSION: 1. The OG tube terminates in the left upper quadrant. 2. Probable opacity in the left lung base. Electronically Signed   By: Dorise Bullion III M.D   On: 11/01/2016 16:54       Assessment/Plan:  Principal Problem:   Epidural abscess Active Problems:   Liver failure ([place])   Hyponatremia   Cellulitis of right lower extremity   EtOH dependence ([place])   Thrombocytopenia (H[facility])   Cirrhosis (HCC)   Paraspinal abscess (HCC)   Chronic hepatitis C without hepatic coma (HCC)   Hepatitis   Abnormal transaminases   Alcoholic hepatitis without ascites   ETOH abuse   Abscess of right lower extremity   Cellulitis and abscess of right lower extremity   Hypernatremia   Delirium tremens ([place])   Infection of lumbar spine (HCC)  [name] is a 36 y.o. male with alcoholic cirrhosis complicated by chronic HCV infection and hyperbilirubinemia hospitalized for lumbar diskitis/osteomyelitis and right thigh abscess s/p debridement POD 1.   Lumbar diskitis/osteomyelitis and right thigh abscess s/p debridement POD 1: Blood cultures with no growth date.   -Continue vancomycin and cefepime [Abx Day 5] -Follow-up surgical wound culture  Alcoholic cirrhosis complicated by chronic HCV infection and hyperbilirubinemia: LFTs and total bilirubin downtrending.  -GI following, appreciate recommendations   LOS: 4 days   Charlott Rakes 11/02/2016, 5:26 PM

## 2016-11-02 NOTE — Progress Notes (Signed)
OT Cancellation Note  Patient Details Name: [name] MRN: [id number] DOB: Jan 03, 1981   Cancelled Treatment:    Reason Eval/Treat Not Completed: Patient not medically ready.Pt very agitated. RN deferred at this time. Acute OT to follow up at a later time.   [name] [id number] 11/02/2016, 7:43 AM

## 2016-11-02 NOTE — Op Note (Signed)
NAME:  KHYRAN, ENGBERG NO.:  [id number]  MEDICAL RECORD NO.:  [id number]  LOCATION:                                 FACILITY:  PHYSICIAN:  Doran Heater. Veverly Fells, M.D. DATE OF BIRTH:  12/22/1983  DATE OF PROCEDURE:  11/01/2016 DATE OF DISCHARGE:                              OPERATIVE REPORT   PREOPERATIVE DIAGNOSIS:  Right posterior thigh abscess, deep.  POSTOPERATIVE DIAGNOSIS:  Right posterior thigh abscess, deep.  PROCEDURE PERFORMED:  Incision and drainage and irrigation and debridement, right posterior deep thigh abscess.  ATTENDING SURGEON:  Doran Heater. Veverly Fells, MD.  ASSISTANTS:  None.  ANESTHESIA:  General anesthesia was used.  This was done as a combined procedure with Dr. Melina Schools doing a debridement of a lumbar abscess.  INSTRUMENT COUNTS:  Correct.  COMPLICATIONS:  No complications.  ANTIBIOTICS:  Perioperative antibiotics were given.  DRAINS:  Two drains were placed.  One was a large 7 mm JP drain, posteromedial thigh and another was a medium Hemovac, posterolateral thigh, both placed on close suction.  INDICATIONS:  The patient is a 36 year old male with progressive right leg swelling and pain, critically ill, and also with a spinal abscess. The patient has been admitted by Internal Medicine and was medically optimized.  The patient was still considered high risk for surgery.  He has a history of chronic hepatitis C and cirrhosis and most recently went into some DTs and was transferred to the intensive care unit.  Dr. Melina Schools has been the lead orthopedic surgeon on this case.  I was asked to come in and provide orthopedic support and basically do the posterior thigh abscess that was picked up on an MRI scan of the thigh. Upon review of the MRI scan of the thigh and discussed with the patient's family, we recommended surgical incision and drainage, irrigation, and debridement of the thigh abscess to eliminate infection from the leg as  well as placement of close suction drainage.  The patient's family did agree to surgery.  Informed consent obtained.  DESCRIPTION OF PROCEDURE:  After an adequate level of anesthesia achieved, the patient was positioned prone on the operating room table. Neurovascular structures padded appropriately.  The patient was draped both for the right leg and for the spine.  Surgery were carried out concurrently with myself working on the leg and Dr. Rolena Infante working on the back.  After verification of time-out for both procedures, initiated surgery after sterile prep and drape and verification of our time-out with a longitudinal posterior incision as the patient was prone.  This was done with a #10 blade scalpel, about a 15 cm incision, posterior thigh, careful not get into flexion crease beyond the knee.  Dissection down through subcutaneous tissues using Bovie.  Bovie was used to control hemostasis.  Several veins were encountered and coagulated.  I was able to blunt dissect through the posterior thigh fascia medially encountering liquified purplish fluid from the abscess.  We went ahead and sent that for culture for Gram stain and culture, aerobic, anaerobic.  Thorough irrigation and debridement was performed of the posterior thigh.  This was the posterior medial side of the posterior thigh compartment and also  the posterolateral side.  Abscesses were noted tracking both on the medial side and lateral side proximally and distally.  After we were sure that we had evacuated all the abscesses, we used 6 L of pulsatile normal saline irrigation to completely lavage and clean out the leg.  We then placed a large fluted 7 mm Jackson-Pratt drain posteromedially deep and then a medium Hemovac posterolaterally. Both were sewn in and brought out through a skin bridge.  At this point, we repaired the subcu and skin in a single layer over the muscular compartment with #1 Prolene suture with a vertical  mattress suture technique.  We had good apposition of the skin edges.  A sterile compressive bandage was applied followed by wrapped from the toes with an Ace wrap all the way up to the thigh.  The patient tolerated the procedure well and will be following his cultures.     Doran Heater. Veverly Fells, M.D.   ______________________________ Doran Heater. Veverly Fells, M.D.    SRN/MEDQ  D:  11/01/2016  T:  11/02/2016  Job:  [id number]

## 2016-11-02 NOTE — Progress Notes (Signed)
    Subjective: Procedure(s) (LRB): DECOMPRESSIVE POSTERIOR LUMBAR LAMINECTOMY LEVEL 4-5 (N/A) IRRIGATION AND DEBRIDEMENT ABSCESS RIGHT THIGH (Right) 1 Day Post-Op  Patient intubated/sedated.   Moving all extremities when sedation decreased  Objective: Vital signs in last 24 hours: Temp:  [93.4 F (34.1 C)-99.3 F (37.4 C)] 98.4 F (36.9 C) (02/05 1219) Pulse Rate:  [56-115] 74 (02/05 1300) Resp:  [18-25] 20 (02/05 1235) BP: (86-123)/(45-73) 99/59 (02/05 1300) SpO2:  [96 %-100 %] 98 % (02/05 1300) Arterial Line BP: (87-127)/(34-91) 102/45 (02/05 1300) FiO2 (%):  [40 %] 40 % (02/05 1235)  Intake/Output from previous day: 02/04 0701 - 02/05 0700 In: 6150.7 [I.V.:3699.1; Blood:1661.6; NG/GT:90; IV Piggyback:700] Out: 3010 [Urine:2450; Drains:160; Blood:400]  Labs:  Recent Labs  11/01/16 1848 11/02/16 0153  WBC 12.1* 13.5*  RBC 2.61* 2.60*  HCT 25.3* 25.3*  PLT 83* 107*    Recent Labs  11/01/16 0338 11/01/16 1212 11/02/16 0153  NA 132* 137 137  K 4.5 4.2 4.2  CL 104  --  107  CO2 25  --  24  BUN 11  --  19  CREATININE 0.67  --  0.83  GLUCOSE 150*  --  117*  CALCIUM 7.7*  --  7.8*    Recent Labs  11/01/16 0918 11/01/16 1848  INR 1.81 1.88    Physical Exam: ABD soft Intact pulses distally Incision: dressing C/D/I  Back drain: 130 overnight Thigh: 30 overnight   Assessment/Plan: Patient stable  xrays n/a Continue mobilization with physical therapy Continue care  Drain output minimal since 7AM today. Continue intubation - extubation per CCM Continue abx - new cx's pending  Melina Schools, MD [place] [phone]

## 2016-11-02 NOTE — Progress Notes (Signed)
Initial Nutrition Assessment  DOCUMENTATION CODES:   Obesity unspecified  INTERVENTION:    Vital High Protein at 10 ml/h (240 ml per day)  Pro-stat 60 ml QID  Provides 1040 kcal, 141 gm protein, 201 ml free water daily  Total intake with TF + propofol will be 1990 kcal  NUTRITION DIAGNOSIS:   Inadequate oral intake related to inability to eat as evidenced by NPO status.  GOAL:   Provide needs based on ASPEN/SCCM guidelines  MONITOR:   Vent status, TF tolerance, Labs, I & O's  REASON FOR ASSESSMENT:   Consult Enteral/tube feeding initiation and management  ASSESSMENT:   36 yo with L4-5 septic arthritis, paraspinal abscess, Rt thigh abscess, and Rt lower leg cellulitis.  He has hx of ETOH with acute alcoholic hepatitis, and hepatitis C.  He developed altered mental status from delirium tremens.  Patient required intubation on 2/4. Discussed patient with RN today. Received MD Consult for TF initiation and management. Nutrition-Focused physical exam completed. Findings are no fat depletion, no muscle depletion, and moderate edema.  Patient is currently intubated on ventilator support MV: 11.8 L/min Temp (24hrs), Avg:97.8 F (36.6 C), Min:93.4 F (34.1 C), Max:99.3 F (37.4 C)  Propofol: 36 ml/hr providing 950 kcal per day from lipids. Will be unable to meet protein needs without exceeding kcal needs due to high rate of propofol. Labs reviewed. Medications reviewed and include lactulose, MVI, thiamine, folic acid.  Diet Order:  Diet NPO time specified  Skin:  Wound (see comment) (R thigh abscess, RLE cellulitis, paraspinal abscess)  Last BM:  1/29  Height:   Ht Readings from Last 1 Encounters:  10/29/16 5\' 11"  (1.803 m)    Weight:   Wt Readings from Last 1 Encounters:  10/31/16 266 lb 1.6 oz (120.7 kg)    Ideal Body Weight:  78.2 kg  BMI:  Body mass index is 37.11 kg/m.  Estimated Nutritional Needs:   Kcal:  [id number]  Protein:  156  gm  Fluid:  2.3 L  EDUCATION NEEDS:   No education needs identified at this time  Molli Barrows, [place], [place], [place] Pager [phone] After Hours Pager [phone]

## 2016-11-02 NOTE — Progress Notes (Signed)
PT Cancellation Note  Patient Details Name: [name] MRN: [id number] DOB: Oct 12, 1980   Cancelled Treatment:    Reason Eval/Treat Not Completed: Patient not medically ready. Pt very agitated. RN deferred at this time. Acute PT to return as able as appropriate.   Tammara Massing M Justn Quale 11/02/2016, 7:42 AM   Kittie Plater, PT, DPT Pager #: [phone] Office #: [phone]

## 2016-11-02 NOTE — Progress Notes (Addendum)
PCCM Consult Note  Admission date: 10/29/2016 Consult date: 10/31/2016 Referring provider: Dr. Sherral Hammers  CC: Delirium  HPI: 36 yo male developed swelling in Rt leg and foot, and back pain.  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  He was seen by orthopedics, and started on IV abx.  He has hx of Hepatitis C and ETOH.  He was noted to have jaundice and was seen by GI.  He was started on prednisone for alcoholic hepatitis.  He was also seen by ID.  He developed progressive altered mental status due to delirium tremens.  He was transferred to ICU.  SUBJECTIVE : Remains on vent  Vital signs: BP (!) 96/59   Pulse 76   Temp 98.8 F (37.1 C) (Oral)   Resp 20   Ht 5\' 11"  (1.803 m)   Wt 266 lb 1.6 oz (120.7 kg)   SpO2 99%   BMI 37.11 kg/m   Intake/output: I/O last 3 completed shifts: In: 7632.8 [P.O.:1000; I.V.:3981.2; Blood:1661.6; NG/GT:90; IV Piggyback:900] Out: 46 [Urine:2950; Drains:160; Blood:400]   Vent Mode: PRVC FiO2 (%):  [40 %] 40 % Set Rate:  [20 bmp] 20 bmp Vt Set:  [600 mL] 600 mL PEEP:  [5 cmH20] 5 cmH20 Plateau Pressure:  [17 cmH20-19 cmH20] 19 cmH20  General: Jaundice young man sedated, intubated.  Neuro: sedated. Intubated. CN grossly intact. (-) lateralizing signs.  Eyes: Scelra icterus  ENT: no stridor Cardiac: RRR, no m/r/g noted  Chest: no wheeze. Good ae. CTA.  Abd: soft, BS+, non-distended, non-tender  Ext: Rt leg warm, wrapped, drains in place with serosanguinous discharge noted, drain noted from lower back containing serosanguinous discharge, 2+pitting edema of right extremity, 1+ pitting edema on right LE  Skin: Wrapped surgically debrided right leg   CMP Latest Ref Rng & Units 11/02/2016 11/01/2016 10/31/2016  Glucose 65 - 99 mg/dL 117(H) 150(H) 80  BUN 6 - 20 mg/dL 19 11 7   Creatinine 0.61 - 1.24 mg/dL 0.83 0.67 0.73  Sodium 135 - 145 mmol/L 137 132(L) 133(L)  Potassium 3.5 - 5.1 mmol/L  4.2 4.5 3.6  Chloride 101 - 111 mmol/L 107 104 102  CO2 22 - 32 mmol/L 24 25 23   Calcium 8.9 - 10.3 mg/dL 7.8(L) 7.7(L) 7.7(L)  Total Protein 6.5 - 8.1 g/dL 5.4(L) 6.1(L) 6.4(L)  Total Bilirubin 0.3 - 1.2 mg/dL 11.2(H) 15.2(H) 16.0(H)  Alkaline Phos 38 - 126 U/L 123 166(H) 212(H)  AST 15 - 41 U/L 175(H) 263(H) 268(H)  ALT 17 - 63 U/L 67(H) 91(H) 78(H)     CBC Latest Ref Rng & Units 11/02/2016 11/01/2016 10/31/2016  WBC 4.0 - 10.5 K/uL 13.5(H) 12.1(H) 17.6(H)  Hemoglobin 13.0 - 17.0 g/dL 8.7(L) 8.7(L) 11.8(L)  Hematocrit 39.0 - 52.0 % 25.3(L) 25.3(L) 34.4(L)  Platelets 150 - 400 K/uL 107(L) 83(L) 57(L)    Lab Results  Component Value Date   INR 1.88 11/01/2016   INR 1.81 11/01/2016   INR 2.30 11/01/2016    ABG    Component Value Date/Time   PHART 7.362 11/01/2016 0120   PCO2ART 41.7 11/01/2016 0120   PO2ART 65.6 (L) 11/01/2016 0120   HCO3 23.1 11/01/2016 0120   ACIDBASEDEF 1.5 11/01/2016 0120   O2SAT 89.7 11/01/2016 0120    CBG (last 3)   Recent Labs  11/01/16 0349 11/01/16 0853 11/02/16 0439  GLUCAP 126* 126* 80     Imaging: Dg Lumbar Spine 2-3 Views  Result Date:  11/01/2016 CLINICAL DATA:  Post lumbar laminectomy. EXAM: LUMBAR SPINE - 2-3 VIEW COMPARISON:  None. FINDINGS: Surgical probe positioned over the posterior elements at the level of the L4-5 disc space. IMPRESSION: Surgical probe with tip overlying the posterior elements at the L4-5 disc space level. Discussed with Dr. Rolena Infante in the [facility]. Electronically Signed   By: Franki Cabot M.D.   On: 11/01/2016 12:11   Dg Chest Port 1 View  Result Date: 11/01/2016 CLINICAL DATA:  Life support line placement. History of hepatitis-C and epidural abscess. EXAM: PORTABLE CHEST 1 VIEW COMPARISON:  Chest radiograph October 29, 2016 FINDINGS: LEFT internal jugular central venous catheter distal tip projects at cavoatrial junction. Endotracheal tube tip projects 4 cm above the carina. Nasogastric tube tip projects in proximal  stomach. The cardiac silhouette is mildly enlarged. Low inspiratory examination with crowded, mildly engorged vascular markings and mild interstitial prominence. No pleural effusion. No focal consolidation. No pneumothorax. Large body habitus. Osseous structures are nonsuspicious. IMPRESSION: LEFT internal jugular central venous catheter distal tip projects at cavoatrial junction. Endotracheal tube tip projects 4 cm above the carina. Nasogastric tube tip projects in proximal stomach. Stable cardiomegaly and mild interstitial prominence. Electronically Signed   By: Elon Alas M.D.   On: 11/01/2016 01:23   Dg Abd Portable 1v  Result Date: 11/01/2016 CLINICAL DATA:  Evaluate OG tube. EXAM: PORTABLE ABDOMEN - 1 VIEW COMPARISON:  None. FINDINGS: The OG tube terminates in the left upper quadrant. Left basilar opacity and probable small effusion identified. No other acute abnormalities. IMPRESSION: 1. The OG tube terminates in the left upper quadrant. 2. Probable opacity in the left lung base. Electronically Signed   By: Dorise Bullion III M.D   On: 11/01/2016 16:54    Studies: RUQ u/s 2/01 >> diffuse hepatic echogenicity MR Rt leg 2/02 >> abscess posterior compartment of thigh, along course of sciatic/femoral nerve, cellulitis below the knee  Antibiotics: Zosyn 2/01 >> 2/01 Vancomycin 2/01 >> Cefepime 2/02 >>  Cultures: Blood 2/01 >>No Growth  Wound Culture  2/04>>  Lines/tubes: ETT 2/3 >  TLC  2/3 >  Events: 2/01 Admit 2/03 Transfer to ICU, start precedex 2/04 Surgical debridement of lumbar and right thigh   Summary: 36 yo with L4-5 septic arthritis, paraspinal abscess, Rt thigh abscess, and Rt lower leg cellulitis.  He has hx of ETOH with acute alcoholic hepatitis, and hepatitis C.  He developed altered mental status from delirium tremens.  Assessment/plan:  INFECTIOUS  A: Sepsis 2/2 L4-5 septic arthritis, paraspinal abscess, Rt thigh abscess, Rt lower leg cellulitis. MRI  Tib/Fib/Femur  positive for extensive abscesses involving the posterior compartment of the thigh along with myofasciitis and cellulitis.  On 2/04 surgery for decompressive posterior lumbar laminectomy L4-5 and I&D of right thigh abscess   Continues to have leukocytosis  P:  - ID and surgery following  - Continue vanc and cefepime  - Drains in place    CARDIOVASCULAR A:  No Cardiac hx  P:  - MAP > 60  - Not requiring pressors, resuscitated with fluids 3.6 L      PULMONARY A: Acute hypoxemic respiratory failure 2/2 unable to protect airway 2/2 DTs with a CIWA of 42  P:   - cont vent support. FIO2 40%, PEEP 5   RENAL A:  Stable SCr  P:  Daily BMETs   GASTROINTESTINAL A:   Acute alcoholic hepatitis. Hx of Hepatitis C. Elevated LFTs and Total bilirubin, trending down  INR stable  P:  Continue Solumedrol 2/4. Continue to follow LFTs, INR GI following  Start TF  Continue Protonix    HEMATOLOGIC A:   Coagulopathy, thrombocytopenia 2nd to liver disease. S/P Vit K and 4u FFP, albumin on 2/4.  Thrombocytopenia improved  Hgb stable, down trending  PT/PTT improved  INR improving  P:  Continue Daily CBC and INR Transfuse as needed  DVT prophylaxis - SCDs  ENDOCRINE A:   Hypoglycemic  P:   Start tube feeds Start SSI  NEUROLOGIC A:   Encephalopathy 2/2 Delirium tremens. Possible Drug abuse. Also related to hepatic encephalopathy.  Ammonia trending down  P: - fentanyl drip, propofol, versed pushes   - RASS goal 0 to -1 - thiamine, folic acid - cont lactulose TID   STAFF NOTE: I, Merrie Roof, MD FACP have personally reviewed patient's available data, including medical history, events of note, physical examination and test results as part of my evaluation. I have discussed with resident/NP and other care providers such as pharmacist, RN and RRT. In addition, I personally evaluated patient and elicited key findings of: rass -2, fc okay, no fevers, no  pressors, drain sin place, pcxr is ABNORMAL - int changes acute vs chronic, will need CT of chest pre DC, ABg reviewed keep same MV, weaning today, sbt, cpap 5 ps5, goal 1 hour, had WD prior to ETT, maintain thiamine, folic, keep prop but consider change to precedex in settin gliver dz , add benzo scheduled, ABx per ID, monitor drain output, not impressed for steroids for etoh hepatitis, likely reduce in am , follow LFt further, feed The patient is critically ill with multiple organ systems failure and requires high complexity decision making for assessment and support, frequent evaluation and titration of therapies, application of advanced monitoring technologies and extensive interpretation of multiple databases.   Critical Care Time devoted to patient care services described in this note is35 Minutes. This time reflects time of care of this signee: Merrie Roof, MD FACP. This critical care time does not reflect procedure time, or teaching time or supervisory time of PA/NP/Med student/Med Resident etc but could involve care discussion time. Rest per NP/medical resident whose note is outlined above and that I agree with   Lavon Paganini. Titus Mould, MD, [place] Pgr: [facility] 11/02/2016 11:32 AM

## 2016-11-02 NOTE — Progress Notes (Signed)
   Subjective: 1 Day Post-Op Procedure(s) (LRB): DECOMPRESSIVE POSTERIOR LUMBAR LAMINECTOMY LEVEL 4-5 (N/A) IRRIGATION AND DEBRIDEMENT ABSCESS RIGHT THIGH (Right)  Pt intubated and sedated Drain in place to right thigh with continued output Dressing in place with no signs of drainage  Objective:   VITALS:   Vitals:   11/02/16 0522 11/02/16 0600  BP: (!) 86/73 (!) 96/59  Pulse: 78 76  Resp: 20   Temp:      Right thigh dressing in place  Drain with continued ouput therefore left in place Mild edema distally right ankle  LABS  Recent Labs  10/31/16 0416 11/01/16 1848 11/02/16 0153  HGB 11.8* 8.7* 8.7*  HCT 34.4* 25.3* 25.3*  WBC 17.6* 12.1* 13.5*  PLT 57* 83* 107*     Recent Labs  10/31/16 0416 11/01/16 0338 11/02/16 0153  NA 133* 132* 137  K 3.6 4.5 4.2  BUN 7 11 19   CREATININE 0.73 0.67 0.83  GLUCOSE 80 150* 117*     Assessment/Plan: 1 Day Post-Op Procedure(s) (LRB): DECOMPRESSIVE POSTERIOR LUMBAR LAMINECTOMY LEVEL 4-5 (N/A) IRRIGATION AND DEBRIDEMENT ABSCESS RIGHT THIGH (Right) Will continue to monitor his progress Plan to pull drain either late this afternoon or tomorrow    Merla Riches, MPAS, PA-C  11/02/2016, 7:59 AM

## 2016-11-02 NOTE — Progress Notes (Signed)
  Pharmacy Antibiotic Note  [name] is a 36 y.o. male admitted on 10/29/2016 with L4-L5 septic arthritis with paraspinal abscess and right leg cellulitis.  Pharmacy has been consulted for vancomycin and cefepime dosing. His renal function remains stable with good urine output.  Plan: - Continue vancomycin 1000 mg IV q8h - Increase cefepime to 2g IV q8h - Will monitor vancomycin trough tomorrow morning - Monitor renal function, C&S and length of therapy  Height: 5\' 11"  (180.3 cm) Weight: 266 lb 1.6 oz (120.7 kg) IBW/kg (Calculated) : 75.3  Temp (24hrs), Avg:97.8 F (36.6 C), Min:93.4 F (34.1 C), Max:99.3 F (37.4 C)   Recent Labs Lab 10/29/16 1214 10/29/16 1630 10/29/16 1643 10/30/16 0514 10/31/16 0416 11/01/16 0338 11/01/16 1848 11/02/16 0153  WBC 19.3*  --   --  15.5* 17.6*  --  12.1* 13.5*  CREATININE 0.67  --   --  0.67 0.73 0.67  --  0.83  LATICACIDVEN  --  1.6 1.98*  --   --  0.9  --   --     Estimated Creatinine Clearance: 164.3 mL/min (by C-G formula based on SCr of 0.83 mg/dL).    No Known Allergies  Antimicrobials this admission: Zosyn 2/2 >>2/2 Cefepime 2/2 >> Vancomycin 2/2 >>  Microbiology results: 2/1 Blood x 2 >> ngtd 2/1 MRSA PCR neg 2/4 Tissue L4-L5 cx (intra-op) - no org seen  2/4 Abscess cx (intra-op) >>  Thank you for allowing pharmacy to be a part of this patient's care.  Dimitri Ped, PharmD, BCPS PGY-2 Infectious Diseases Pharmacy Resident Pager: [phone] 11/02/2016 2:48 PM

## 2016-11-03 ENCOUNTER — Inpatient Hospital Stay (HOSPITAL_COMMUNITY): Payer: BLUE CROSS/BLUE SHIELD

## 2016-11-03 LAB — PREPARE FRESH FROZEN PLASMA
UNIT DIVISION: 0
UNIT DIVISION: 0
Unit division: 0
Unit division: 0

## 2016-11-03 LAB — HEPATIC FUNCTION PANEL
ALK PHOS: 123 U/L (ref 38–126)
ALT: 73 U/L — ABNORMAL HIGH (ref 17–63)
AST: 162 U/L — ABNORMAL HIGH (ref 15–41)
Albumin: 1.8 g/dL — ABNORMAL LOW (ref 3.5–5.0)
BILIRUBIN INDIRECT: 5 mg/dL — AB (ref 0.3–0.9)
Bilirubin, Direct: 5.1 mg/dL — ABNORMAL HIGH (ref 0.1–0.5)
Total Bilirubin: 10.1 mg/dL — ABNORMAL HIGH (ref 0.3–1.2)
Total Protein: 6.4 g/dL — ABNORMAL LOW (ref 6.5–8.1)

## 2016-11-03 LAB — BASIC METABOLIC PANEL
ANION GAP: 9 (ref 5–15)
BUN: 18 mg/dL (ref 6–20)
CO2: 24 mmol/L (ref 22–32)
CREATININE: 0.65 mg/dL (ref 0.61–1.24)
Calcium: 8.3 mg/dL — ABNORMAL LOW (ref 8.9–10.3)
Chloride: 105 mmol/L (ref 101–111)
GFR calc Af Amer: >60 mL/min (ref >60)
GFR calc non Af Amer: >60 mL/min (ref >60)
GLUCOSE: 109 mg/dL — AB (ref 65–99)
Potassium: 3.5 mmol/L (ref 3.5–5.1)
Sodium: 138 mmol/L (ref 135–145)

## 2016-11-03 LAB — AMMONIA: Ammonia: 66 umol/L — ABNORMAL HIGH (ref 9–35)

## 2016-11-03 LAB — CULTURE, BLOOD (ROUTINE X 2)
CULTURE: NO GROWTH
CULTURE: NO GROWTH

## 2016-11-03 LAB — PROTIME-INR
INR: 2
INR: 2.05
PROTHROMBIN TIME: 23.4 s — AB (ref 11.4–15.2)
Prothrombin Time: 23 seconds — ABNORMAL HIGH (ref 11.4–15.2)

## 2016-11-03 LAB — CBC
HEMATOCRIT: 28.2 % — AB (ref 39.0–52.0)
Hemoglobin: 9.3 g/dL — ABNORMAL LOW (ref 13.0–17.0)
MCH: 32.7 pg (ref 26.0–34.0)
MCHC: 33 g/dL (ref 30.0–36.0)
MCV: 99.3 fL (ref 78.0–100.0)
PLATELETS: 99 10*3/uL — AB (ref 150–400)
RBC: 2.84 MIL/uL — ABNORMAL LOW (ref 4.22–5.81)
RDW: 18 % — AB (ref 11.5–15.5)
WBC: 9.9 10*3/uL (ref 4.0–10.5)

## 2016-11-03 LAB — MAGNESIUM
MAGNESIUM: 2.1 mg/dL (ref 1.7–2.4)
MAGNESIUM: 2 mg/dL (ref 1.7–2.4)

## 2016-11-03 LAB — GLUCOSE, CAPILLARY
GLUCOSE-CAPILLARY: 87 mg/dL (ref 65–99)
GLUCOSE-CAPILLARY: 125 mg/dL — AB (ref 65–99)
Glucose-Capillary: 100 mg/dL — ABNORMAL HIGH (ref 65–99)
Glucose-Capillary: 96 mg/dL (ref 65–99)
Glucose-Capillary: 85 mg/dL (ref 65–99)

## 2016-11-03 LAB — VANCOMYCIN, TROUGH: Vancomycin Tr: 17 ug/mL (ref 15–20)

## 2016-11-03 LAB — PHOSPHORUS
PHOSPHORUS: 4.4 mg/dL (ref 2.5–4.6)
Phosphorus: 3.6 mg/dL (ref 2.5–4.6)

## 2016-11-03 MED ORDER — RIFAXIMIN 200 MG PO TABS: 400.0000 mg | ORAL_TABLET | Freq: Four times a day (QID) | ORAL | Status: DC

## 2016-11-03 MED ORDER — FUROSEMIDE 10 MG/ML IJ SOLN
40.0000 mg | Freq: Once | INTRAMUSCULAR | Status: AC
  Administered 2016-11-03: 40 mg via INTRAVENOUS
  Filled 2016-11-03: qty 4

## 2016-11-03 MED ORDER — POTASSIUM CHLORIDE 20 MEQ/15ML (10%) PO SOLN
20.0000 meq | ORAL | Status: AC
  Administered 2016-11-03 (×2): 20 meq
  Filled 2016-11-03 (×2): qty 15

## 2016-11-03 MED ORDER — RIFAXIMIN 200 MG PO TABS
400.0000 mg | ORAL_TABLET | Freq: Three times a day (TID) | ORAL | Status: DC
  Administered 2016-11-03 – 2016-11-12 (×25): 400 mg via ORAL
  Filled 2016-11-03 (×30): qty 2

## 2016-11-03 MED ORDER — LORAZEPAM 2 MG/ML IJ SOLN
1.0000 mg | Freq: Three times a day (TID) | INTRAMUSCULAR | Status: DC
  Administered 2016-11-03 – 2016-11-05 (×6): 1 mg via INTRAVENOUS
  Filled 2016-11-03 (×7): qty 1

## 2016-11-03 MED ORDER — LACTULOSE 10 GM/15ML PO SOLN
30.0000 g | Freq: Four times a day (QID) | ORAL | Status: DC
  Administered 2016-11-03 – 2016-11-06 (×10): 30 g via ORAL
  Filled 2016-11-03 (×12): qty 45

## 2016-11-03 NOTE — Progress Notes (Signed)
RT called to pt room d/t ett out at 18cm at lip, ett advanced to 24cm, bbs noted. etc02 positive color change. Adequate VT's noted. RN ordered stat chest. RT will continue to monitor.

## 2016-11-03 NOTE — Progress Notes (Signed)
OT Cancellation Note  Patient Details Name: [name] MRN: [id number] DOB: [birth year]   Cancelled Treatment:    Reason Eval/Treat Not Completed: Patient not medically ready (Pt remains intubated and sedated.)  [name] 11/03/2016, 8:09 AM

## 2016-11-03 NOTE — Care Management Note (Signed)
Case Management Note  Patient Details  Name: [name] MRN: [id number] Date of Birth: 1980-12-30  Subjective/Objective:   Pt admitted with altered mental status - positive cultures and cellulitis                 Action/Plan:  Pt is an active IVDU and ETOH.  Pt will likely need long term IV antibiotics - CSW consulted for tentative placement.  CM will continue to follow for discharge needs   Expected Discharge Date:   (unknown)               Expected Discharge Plan:  [place]  In-House Referral:  Clinical Social Work  Discharge planning Services  CM Consult  Post Acute Care Choice:    Choice offered to:     DME Arranged:    DME Agency:     HH Arranged:    [place] Agency:     Status of Service:  In process, will continue to follow  If discussed at Long Length of Stay Meetings, dates discussed:    Additional Comments:  Maryclare Labrador, RN 11/03/2016, 2:10 PM

## 2016-11-03 NOTE — Progress Notes (Signed)
      INFECTIOUS DISEASE ATTENDING ADDENDUM:   Date: 11/03/2016  Patient name: [name]  Medical record number: [id number]  Date of birth: 13-Jul-1981    This patient has been seen and discussed with the Dr. Posey Pronto. Please see the resident's note for complete details. I concur with his findings with the following additions/corrections:   Patient still on the ventilator. Cultures not yet showing anything. We have narrowed to gram positive coverage alone.   If this patient is an active IVDU it will complicate his treatment once he is well enough to leave the hospital. IE we may have to place in [name], vs long acting ORTAVANCIN vs bioavailable oral abx. It is too bad we dont seem likely to isolate an organism.  His alcoholism has got to stop as well. Case discussed also with Dr. Edison Nasuti.  Rhina Brackett [place] 11/03/2016, 12:07 PM

## 2016-11-03 NOTE — Progress Notes (Signed)
Patient ID: [name], male   DOB: 1981/07/04, 36 y.o.   MRN: [id number]  Per Dr. Rolena Infante removed the pts lumbar drain.  Pt was able to assist with turning.  Also had the assistance of two nurses.  Cut surure securing drain, removed drain.  Placed bandage.  Pt tolerated procedure well.  Ronette Deter, [facility]

## 2016-11-03 NOTE — Progress Notes (Signed)
[facility] Progress Note    Since last GI note: TFs started yesterday. Steroids stopped yesterday.  Objective: Vital signs in last 24 hours: Temp:  [97.5 F (36.4 C)-99.1 F (37.3 C)] 97.5 F (36.4 C) (02/06 0834) Pulse Rate:  [54-120] 61 (02/06 0800) Resp:  [20] 20 (02/06 0342) BP: (99-127)/(53-78) 106/68 (02/06 0800) SpO2:  [92 %-100 %] 98 % (02/06 0800) Arterial Line BP: (102-143)/(45-79) 143/70 (02/06 0800) FiO2 (%):  [40 %] 40 % (02/06 0800) Last BM Date: 10/26/16 General: intubated, sedated Heart: regular rate and rythm Abdomen: soft,  non-distended, normal bowel sounds   Lab Results:  Recent Labs  11/01/16 1848 11/02/16 0153 11/03/16 0456  WBC 12.1* 13.5* 9.9  HGB 8.7* 8.7* 9.3*  PLT 83* 107* 99*  MCV 96.9 97.3 99.3    Recent Labs  11/01/16 0338 11/01/16 1212 11/02/16 0153 11/03/16 0456  NA 132* 137 137 138  K 4.5 4.2 4.2 3.5  CL 104  --  107 105  CO2 25  --  24 24  GLUCOSE 150*  --  117* 109*  BUN 11  --  19 18  CREATININE 0.67  --  0.83 0.65  CALCIUM 7.7*  --  7.8* 8.3*    Recent Labs  11/01/16 0338 11/02/16 0153  PROT 6.1* 5.4*  ALBUMIN 1.6* 2.0*  AST 263* 175*  ALT 91* 67*  ALKPHOS 166* 123  BILITOT 15.2* 11.2*  BILIDIR  --  6.1*  IBILI  --  5.1*    Recent Labs  11/03/16 0456 11/03/16 0754  INR 2.00 2.05    Medications: Scheduled Meds: . sodium chloride   Intravenous Once  . cefTRIAXone (ROCEPHIN)  IV  2 g Intravenous Q24H  . chlorhexidine gluconate (MEDLINE KIT)  15 mL Mouth Rinse BID  . Chlorhexidine Gluconate Cloth  6 each Topical Daily  . feeding supplement (PRO-STAT SUGAR FREE 64)  60 mL Per Tube QID  . feeding supplement (VITAL HIGH PROTEIN)  1,000 mL Per Tube Q24H  . folic acid  1 mg Per Tube Daily  . lactulose  30 g Oral TID  . LORazepam  2 mg Intravenous Q8H  . mouth rinse  15 mL Mouth Rinse QID  . multivitamin with minerals  1 tablet Per Tube Daily  . pantoprazole sodium  40 mg Per Tube Daily   . potassium chloride  20 mEq Per Tube Q4H  . sodium chloride flush  10-40 mL Intracatheter Q12H  . sodium chloride flush  3 mL Intravenous Q12H  . thiamine  100 mg Per Tube Daily  . vancomycin  1,000 mg Intravenous Q8H   Continuous Infusions: . sodium chloride    . sodium chloride 75 mL/hr at 11/03/16 0600  . dexmedetomidine 0.2 mcg/kg/hr (11/03/16 0805)  . fentaNYL infusion INTRAVENOUS 100 mcg/hr (11/03/16 0805)  . lactated ringers    . propofol (DIPRIVAN) infusion Stopped (11/02/16 1321)   PRN Meds:.fentaNYL, midazolam, ondansetron **OR** ondansetron (ZOFRAN) IV, sodium chloride flush, sodium chloride flush    Assessment/Plan: 36 y.o. male with severe alcoholic hepatitis, deep tissue infection of thigh, paraspinal infection  Continue supportive care with IV abx, ventilator, tube feeds.  Will follow along.    Milus Banister, MD  11/03/2016, 8:48 AM [facility] Pager [phone]

## 2016-11-03 NOTE — Progress Notes (Signed)
  Pharmacy Antibiotic Note  [name] is a 36 y.o. male admitted on 10/29/2016 with L4-L5 septic arthritis with paraspinal abscess and right leg cellulitis.  Pharmacy has been consulted for vancomycin and cefepime dosing. His renal function remains stable with good urine output. Vancomycin trough today was 17 (therapeutic), will continue current regimen.  Plan: - Continue vancomycin 1000 mg IV q8h - Ceftriaxone 2g IV q24h - Monitor renal function, C&S and length of therapy  Height: 5\' 11"  (180.3 cm) Weight:  (unable to obtain weight, bed stating pt weight as 118lbs) IBW/kg (Calculated) : 75.3  Temp (24hrs), Avg:97.7 F (36.5 C), Min:97.5 F (36.4 C), Max:98.3 F (36.8 C)   Recent Labs Lab 10/29/16 1630 10/29/16 1643 10/30/16 0514 10/31/16 0416 11/01/16 0338 11/01/16 1848 11/02/16 0153 11/03/16 0456 11/03/16 0942  WBC  --   --  15.5* 17.6*  --  12.1* 13.5* 9.9  --   CREATININE  --   --  0.67 0.73 0.67  --  0.83 0.65  --   LATICACIDVEN 1.6 1.98*  --   --  0.9  --   --   --   --   VANCOTROUGH  --   --   --   --   --   --   --   --  17    Estimated Creatinine Clearance: 170.4 mL/min (by C-G formula based on SCr of 0.65 mg/dL).    No Known Allergies  Antimicrobials this admission: Zosyn 2/2 >>2/2 Cefepime 2/2 >>2/5 Vancomycin 2/2 >> Ceftriaxone 2/5 >>  Dose Adjustments: 2/6 VT: 17 (therapeutic) continue 1g IV q8h  Microbiology results: 2/1 Blood x 2 >> ngtd 2/1 MRSA PCR neg 2/4 Tissue L4-L5 cx (intra-op) - no org seen  2/4 Abscess cx (intra-op) >>  Thank you for allowing pharmacy to be a part of this patient's care.  Dimitri Ped, PharmD, BCPS PGY-2 Infectious Diseases Pharmacy Resident Pager: [phone] 11/03/2016 3:44 PM

## 2016-11-03 NOTE — Progress Notes (Signed)
Subjective: This morning, he remains sedated and intubated though is more arousable per RN.   Antibiotics:  Anti-infectives    Start     Dose/Rate Route Frequency Ordered Stop   11/03/16 1200  rifaximin (XIFAXAN) tablet 400 mg     400 mg Oral Every 8 hours 11/03/16 1109     11/03/16 1115  rifaximin (XIFAXAN) tablet 400 mg  Status:  Discontinued     400 mg Oral 4 times daily 11/03/16 1103 11/03/16 1109   11/02/16 2200  ceFEPIme (MAXIPIME) 2 g in dextrose 5 % 50 mL IVPB  Status:  Discontinued     2 g 100 mL/hr over 30 Minutes Intravenous Every 8 hours 11/02/16 1452 11/02/16 1756   11/02/16 1800  cefTRIAXone (ROCEPHIN) 2 g in dextrose 5 % 50 mL IVPB     2 g 100 mL/hr over 30 Minutes Intravenous Every 24 hours 11/02/16 1756     11/01/16 2200  ceFEPIme (MAXIPIME) 2 g in dextrose 5 % 50 mL IVPB  Status:  Discontinued     2 g 100 mL/hr over 30 Minutes Intravenous Every 12 hours 11/01/16 1520 11/02/16 1452   11/01/16 1800  vancomycin (VANCOCIN) IVPB 1000 mg/200 mL premix     1,000 mg 200 mL/hr over 60 Minutes Intravenous Every 8 hours 11/01/16 1520     11/01/16 1346  vancomycin (VANCOCIN) powder  Status:  Discontinued       As needed 11/01/16 1346 11/01/16 1347   10/30/16 1000  ceFEPIme (MAXIPIME) 2 g in dextrose 5 % 50 mL IVPB  Status:  Discontinued     2 g 100 mL/hr over 30 Minutes Intravenous Every 12 hours 10/30/16 0939 11/01/16 1515   10/29/16 2200  vancomycin (VANCOCIN) IVPB 1000 mg/200 mL premix  Status:  Discontinued     1,000 mg 200 mL/hr over 60 Minutes Intravenous Every 8 hours 10/29/16 1412 11/01/16 1515   10/29/16 2200  piperacillin-tazobactam (ZOSYN) IVPB 3.375 g  Status:  Discontinued     3.375 g 12.5 mL/hr over 240 Minutes Intravenous Every 8 hours 10/29/16 1412 10/29/16 1430   10/29/16 1300  vancomycin (VANCOCIN) IVPB 1000 mg/200 mL premix     1,000 mg 200 mL/hr over 60 Minutes Intravenous  Once 10/29/16 1237 10/29/16 1519   10/29/16 1300   piperacillin-tazobactam (ZOSYN) IVPB 3.375 g     3.375 g 100 mL/hr over 30 Minutes Intravenous  Once 10/29/16 1237 10/29/16 1340      Medications: Scheduled Meds: . sodium chloride   Intravenous Once  . cefTRIAXone (ROCEPHIN)  IV  2 g Intravenous Q24H  . chlorhexidine gluconate (MEDLINE KIT)  15 mL Mouth Rinse BID  . Chlorhexidine Gluconate Cloth  6 each Topical Daily  . feeding supplement (PRO-STAT SUGAR FREE 64)  60 mL Per Tube QID  . feeding supplement (VITAL HIGH PROTEIN)  1,000 mL Per Tube Q24H  . folic acid  1 mg Per Tube Daily  . lactulose  30 g Oral Q6H  . LORazepam  1 mg Intravenous Q8H  . mouth rinse  15 mL Mouth Rinse QID  . multivitamin with minerals  1 tablet Per Tube Daily  . pantoprazole sodium  40 mg Per Tube Daily  . rifaximin  400 mg Oral Q8H  . sodium chloride flush  10-40 mL Intracatheter Q12H  . sodium chloride flush  3 mL Intravenous Q12H  . thiamine  100 mg Per Tube Daily  . vancomycin  1,000 mg Intravenous Q8H  Continuous Infusions: . sodium chloride    . sodium chloride 75 mL/hr at 11/03/16 0600  . dexmedetomidine 0.4 mcg/kg/hr (11/03/16 1549)  . fentaNYL infusion INTRAVENOUS 400 mcg/hr (11/03/16 1413)  . lactated ringers    . propofol (DIPRIVAN) infusion Stopped (11/02/16 1321)   PRN Meds:.fentaNYL, midazolam, ondansetron **OR** ondansetron (ZOFRAN) IV, sodium chloride flush, sodium chloride flush    Objective: Weight change:   Intake/Output Summary (Last 24 hours) at 11/03/16 1739 Last data filed at 11/03/16 1700  Gross per 24 hour  Intake          3676.91 ml  Output             3615 ml  Net            61.91 ml   Blood pressure 111/68, pulse 91, temperature 99.1 F (37.3 C), temperature source Oral, resp. rate 20, height 5' 11" (1.803 m), weight 266 lb 1.6 oz (120.7 kg), SpO2 95 %. Temp:  [97.5 F (36.4 C)-99.1 F (37.3 C)] 99.1 F (37.3 C) (02/06 1601) Pulse Rate:  [54-120] 91 (02/06 1700) Resp:  [18-20] 20 (02/06 1543) BP:  (86-155)/(38-110) 111/68 (02/06 1700) SpO2:  [92 %-100 %] 95 % (02/06 1700) Arterial Line BP: (102-200)/(31-99) 135/63 (02/06 1700) FiO2 (%):  [40 %] 40 % (02/06 1543)  Physical Exam: General: sedated, ETT in place HEENT: icteric sclera, pupils reactive to light and accommodation CVS regular rate, normal rhythm,  no murmur rubs or gallops Chest: clear to auscultation bilaterally in the anterior lung fields Abdomen: soft nontender, nondistended, normal bowel sounds Extremities: RLE wrapped in bandage Neuro: sedated  BMET  Recent Labs  11/02/16 0153 11/03/16 0456  NA 137 138  K 4.2 3.5  CL 107 105  CO2 24 24  GLUCOSE 117* 109*  BUN 19 18  CREATININE 0.83 0.65  CALCIUM 7.8* 8.3*     Liver Panel   Recent Labs  11/02/16 0153 11/03/16 0754  PROT 5.4* 6.4*  ALBUMIN 2.0* 1.8*  AST 175* 162*  ALT 67* 73*  ALKPHOS 123 123  BILITOT 11.2* 10.1*  BILIDIR 6.1* 5.1*  IBILI 5.1* 5.0*       Sedimentation Rate No results for input(s): ESRSEDRATE in the last 72 hours. C-Reactive Protein No results for input(s): CRP in the last 72 hours.  Micro Results: Recent Results (from the past 720 hour(s))  Culture, blood (routine x 2)     Status: None   Collection Time: 10/29/16 12:52 PM  Result Value Ref Range Status   Specimen Description BLOOD RIGHT ANTECUBITAL  Final   Special Requests BOTTLES DRAWN AEROBIC AND ANAEROBIC 5 ML  Final   Culture   Final    NO GROWTH 5 DAYS Performed at [facility], [address] [place], [place] [address]    Report Status 11/03/2016 FINAL  Final  Culture, blood (routine x 2)     Status: None   Collection Time: 10/29/16 12:56 PM  Result Value Ref Range Status   Specimen Description BLOOD LEFT HAND  Final   Special Requests IN PEDIATRIC BOTTLE 2 ML  Final   Culture   Final    NO GROWTH 5 DAYS Performed at [facility], [place] [address], [place], [place] [address]    Report Status 11/03/2016 FINAL  Final  MRSA PCR Screening      Status: None   Collection Time: 10/29/16  9:30 PM  Result Value Ref Range Status   MRSA by PCR NEGATIVE NEGATIVE  Final    Comment:        The GeneXpert MRSA Assay (FDA approved for NASAL specimens only), is one component of a comprehensive MRSA colonization surveillance program. It is not intended to diagnose MRSA infection nor to guide or monitor treatment for MRSA infections.   Aerobic/Anaerobic Culture (surgical/deep wound)     Status: None (Preliminary result)   Collection Time: 11/01/16 11:23 AM  Result Value Ref Range Status   Specimen Description ABSCESS RIGHT THIGH  Final   Special Requests POSTERIOR THIGH  Final   Gram Stain   Final    FEW WBC PRESENT, PREDOMINANTLY PMN RARE GRAM POSITIVE COCCI IN PAIRS    Culture CULTURE REINCUBATED FOR BETTER GROWTH  Final   Report Status PENDING  Incomplete  Aerobic/Anaerobic Culture (surgical/deep wound)     Status: None (Preliminary result)   Collection Time: 11/01/16 11:42 AM  Result Value Ref Range Status   Specimen Description ABSCESS RIGHT BACK  Final   Special Requests   Final    POSTERIOR LATERAL LUMBAR L4 L5 ABSCESS POF CEFATIN AND VANC   Gram Stain   Final    MODERATE WBC PRESENT, PREDOMINANTLY PMN NO ORGANISMS SEEN    Culture NO GROWTH 1 DAY  Final   Report Status PENDING  Incomplete  Aerobic/Anaerobic Culture (surgical/deep wound)     Status: None (Preliminary result)   Collection Time: 11/01/16 12:01 PM  Result Value Ref Range Status   Specimen Description WOUND BACK  Final   Special Requests LUMBAR L4 L5 FACET SPEC C POF CEFTIN AND VANC  Final   Gram Stain   Final    MODERATE WBC PRESENT, PREDOMINANTLY PMN NO ORGANISMS SEEN    Culture CULTURE REINCUBATED FOR BETTER GROWTH  Final   Report Status PENDING  Incomplete  Aerobic/Anaerobic Culture (surgical/deep wound)     Status: None (Preliminary result)   Collection Time: 11/01/16  1:47 PM  Result Value Ref Range Status   Specimen Description TISSUE  Final     Special Requests   Final    L4 L5 DISC MATERIAL SPECIMEN CUP D PATIENT ON FOLLOWING  CEFTIN AND VANCOMYCIN    Gram Stain   Final    RARE WBC PRESENT, PREDOMINANTLY MONONUCLEAR NO ORGANISMS SEEN    Culture   Final    NO GROWTH 2 DAYS NO ANAEROBES ISOLATED; CULTURE IN PROGRESS FOR 5 DAYS   Report Status PENDING  Incomplete    Studies/Results: Dg Chest Port 1 View  Result Date: 11/03/2016 CLINICAL DATA:  Repositioning of endotracheal tube. Tube was pulled earlier today. EXAM: PORTABLE CHEST 1 VIEW COMPARISON:  11/01/2016 FINDINGS: Tip of endotracheal tube is 3.3 cm above the carina in satisfactory position. Left IJ central line catheter is noted at the cavoatrial junction. No pneumothorax. Gastric tube extends below the left hemidiaphragm and appears to coil back towards the expected location of the gastric cardia. Low lung volumes with crowding of interstitial lung markings. Stable cardiomegaly retrocardiac consolidations are not entirely excluded. IMPRESSION: 1. Satisfactory support line and tube positions. 2. Stable cardiomegaly with mild interstitial edema. Cannot exclude trace right effusion nor retrocardiac pulmonary consolidation versus atelectasis. Electronically Signed   By: Ashley Royalty M.D.   On: 11/03/2016 14:59      Assessment/Plan:  Principal Problem:   Epidural abscess Active Problems:   Liver failure (HCC)   Hyponatremia   Cellulitis of right lower extremity   EtOH dependence (HCC)   Thrombocytopenia (HCC)   Cirrhosis (HCC)   Paraspinal  abscess ([place])   Chronic hepatitis C without hepatic coma (HCC)   Hepatitis   Abnormal transaminases   Alcoholic hepatitis without ascites   ETOH abuse   Abscess of right lower extremity   Cellulitis and abscess of right lower extremity   Hypernatremia   Delirium tremens ([place])   Infection of lumbar spine (HCC)  [name] is a 36 y.o. male with alcoholic cirrhosis complicated by chronic HCV infection and hyperbilirubinemia  hospitalized for lumbar diskitis/osteomyelitis and right thigh abscess s/p debridement POD 2.  Lumbar diskitis/osteomyelitis and right thigh abscess s/p debridement POD 2: Right thigh abscess culture with rare Gram+ cocci, so cefepime transitioned to ceftriaxone yesterday given low concern for Pseudomonas. No other cultures have resulted. -Continue vancomycin and ceftriaxone [Abx Day 6]  Alcoholic cirrhosis complicated by chronic HCV infection and hyperbilirubinemia: AST/ALT minimally changed though bilirubin down 1 point from yesterday. Rifaximin added for risk of hepatic encephalopathy. -Encourage abstinence when awake -GI following, appreciate recommendations   LOS: 5 days   Charlott Rakes 11/03/2016, 5:39 PM

## 2016-11-03 NOTE — Progress Notes (Addendum)
PCCM Consult Note  Admission date: 10/29/2016 Consult date: 10/31/2016 Referring provider: Dr. Sherral Hammers  CC: Delirium  HPI: 36 yo male developed swelling in Rt leg and foot, and back pain.  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  He was seen by orthopedics, and started on IV abx.  He has hx of Hepatitis C and ETOH.  He was noted to have jaundice and was seen by GI.  He was started on prednisone for alcoholic hepatitis.  He was also seen by ID.  He developed progressive altered mental status due to delirium tremens.  He was transferred to ICU.  SUBJECTIVE : Continues on vent Per surgery drains out   Vital signs: BP 107/69   Pulse (!) 56   Temp 97.5 F (36.4 C) (Oral)   Resp 20   Ht 5\' 11"  (1.803 m)   Wt 266 lb 1.6 oz (120.7 kg)   SpO2 100%   BMI 37.11 kg/m   Intake/output: I/O last 3 completed shifts: In: 5067.6 [I.V.:4058.2; NG/GT:59.3; IV Piggyback:950] Out: 3315 [Urine:2990; Drains:325]   Vent Mode: PRVC FiO2 (%):  [40 %] 40 % Set Rate:  [20 bmp] 20 bmp Vt Set:  [600 mL] 600 mL PEEP:  [5 cmH20] 5 cmH20 Plateau Pressure:  [18 [id number] cmH20] 20 cmH20  General: Jaundice young man sedated, intubated.  Neuro: sedated. Intubated. CN grossly intact. (-) lateralizing signs.  Eyes: Scelra icterus  ENT: no stridor Cardiac: RRR, no m/r/g noted  Chest: no wheeze. Good ae. CTA.  Abd: soft, BS+, non-distended, non-tender  Ext: Rt leg warm, wrapped, drains in place with serosanguinous discharge noted, drain noted from lower back containing serosanguinous discharge, 2+pitting edema of right extremity,trace edema on LLE  Skin: Wrapped surgically debrided right leg   CMP Latest Ref Rng & Units 11/03/2016 11/02/2016 11/01/2016  Glucose 65 - 99 mg/dL 109(H) 117(H) -  BUN 6 - 20 mg/dL 18 19 -  Creatinine 0.61 - 1.24 mg/dL 0.65 0.83 -  Sodium 135 - 145 mmol/L 138 137 137  Potassium 3.5 - 5.1 mmol/L 3.5 4.2 4.2  Chloride  101 - 111 mmol/L 105 107 -  CO2 22 - 32 mmol/L 24 24 -  Calcium 8.9 - 10.3 mg/dL 8.3(L) 7.8(L) -  Total Protein 6.5 - 8.1 g/dL - 5.4(L) -  Total Bilirubin 0.3 - 1.2 mg/dL - 11.2(H) -  Alkaline Phos 38 - 126 U/L - 123 -  AST 15 - 41 U/L - 175(H) -  ALT 17 - 63 U/L - 67(H) -     CBC Latest Ref Rng & Units 11/03/2016 11/02/2016 11/01/2016  WBC 4.0 - 10.5 K/uL 9.9 13.5(H) 12.1(H)  Hemoglobin 13.0 - 17.0 g/dL 9.3(L) 8.7(L) 8.7(L)  Hematocrit 39.0 - 52.0 % 28.2(L) 25.3(L) 25.3(L)  Platelets 150 - 400 K/uL 99(L) 107(L) 83(L)    Lab Results  Component Value Date   INR 2.00 11/03/2016   INR 1.88 11/01/2016   INR 1.81 11/01/2016    ABG    Component Value Date/Time   PHART 7.409 11/01/2016 1212   PCO2ART 35.9 11/01/2016 1212   PO2ART 96.0 11/01/2016 1212   HCO3 23.3 11/01/2016 1212   TCO2 25 11/01/2016 1212   ACIDBASEDEF 2.0 11/01/2016 1212   O2SAT 98.0 11/01/2016 1212    CBG (last 3)   Recent Labs  11/02/16 2039 11/02/16 2319 11/03/16 0437  GLUCAP 115* 113* 87     Imaging: Dg Lumbar Spine 2-3  Views  Result Date: 11/01/2016 CLINICAL DATA:  Post lumbar laminectomy. EXAM: LUMBAR SPINE - 2-3 VIEW COMPARISON:  None. FINDINGS: Surgical probe positioned over the posterior elements at the level of the L4-5 disc space. IMPRESSION: Surgical probe with tip overlying the posterior elements at the L4-5 disc space level. Discussed with Dr. Rolena Infante in the [facility]. Electronically Signed   By: Franki Cabot M.D.   On: 11/01/2016 12:11   Dg Abd Portable 1v  Result Date: 11/01/2016 CLINICAL DATA:  Evaluate OG tube. EXAM: PORTABLE ABDOMEN - 1 VIEW COMPARISON:  None. FINDINGS: The OG tube terminates in the left upper quadrant. Left basilar opacity and probable small effusion identified. No other acute abnormalities. IMPRESSION: 1. The OG tube terminates in the left upper quadrant. 2. Probable opacity in the left lung base. Electronically Signed   By: Dorise Bullion III M.D   On: 11/01/2016 16:54     Studies: Doppler 1/29>>> negative for thrombus  RUQ u/s 2/01 >> diffuse hepatic echogenicity MR Rt leg 2/02 >> abscess posterior compartment of thigh, along course of sciatic/femoral nerve, cellulitis below the knee  Antibiotics: Zosyn 2/01 >>2/1 Vancomycin 2/01 >> Cefepime 2/02 >>2/05 Ceftriaxone 2/05>>  Cultures: Blood 2/01 >>No Growth  Wound Culture  2/04>>rare gram-positive cocci in pairs   Lines/tubes: ETT 2/3 >  TLC  2/3 >  Events: 2/01 Admit 2/03 Transfer to ICU, start propofol  2/04 Surgical debridement of lumbar and right thigh   Summary: 36 yo with L4-5 septic arthritis, paraspinal abscess, Rt thigh abscess, and Rt lower leg cellulitis.  He has hx of ETOH with acute alcoholic hepatitis, and hepatitis C.  He developed altered mental status from delirium tremens.  Assessment/plan:  INFECTIOUS  A: Sepsis 2/2 L4-5 septic arthritis, paraspinal abscess, Rt thigh abscess, Rt lower leg cellulitis. MRI Tib/Fib/Femur  positive for extensive abscesses involving the posterior compartment of the thigh along with myofasciitis and cellulitis.  On 2/04 surgery for decompressive posterior lumbar laminectomy L4-5 and I&D of right thigh abscess   P:  - ID and surgery following  - Continue vanc and ceftriaxone  - Drains out  - Doppler of leg on 10/26/2016 negative for thrombus   CARDIOVASCULAR A:  No Cardiac hx  P:  - MAP > 60  - Not requiring pressors   PULMONARY A: Acute hypoxemic respiratory failure 2/2 unable to protect airway 2/2 DTs with a CIWA of 42  P:   - cont vent support. FIO2 40%, PEEP 5, consider weaning today  RENAL A:  Stable SCr, edema P:  Daily BMETs  Lasix soon Even balance goals   GASTROINTESTINAL A:   Acute alcoholic hepatitis. Hx of Hepatitis C. Elevated LFTs and Total bilirubin, trending down  INR stable  P:  Continue to follow LFTs, INR GI following  Continue Protonix  Escalate lactulose may be needed  HEMATOLOGIC A:    Coagulopathy, thrombocytopenia 2nd to liver disease. S/P Vit K and 4u FFP, albumin on 2/4.  Thrombocytopenia slightly downtrending   Hgb stable, improved  PT/PTT slight trending up  INR improving  P:  Continue Daily CBC and INR Transfuse as needed  DVT prophylaxis - SCDs  ENDOCRINE A:   Hypoglycemic  P:   Continue tube feeds, hold for afternoon wean   NEUROLOGIC A:   Encephalopathy 2/2 Delirium tremens. Possible Drug abuse. Also related to hepatic encephalopathy.  Ammonia trending down  P: - fentanyl drip, precedex, versed pushes   - RASS goal 0 to -1 - thiamine, folic acid -  cont lactulose TID   STAFF NOTE: I, Merrie Roof, MD FACP have personally reviewed patient's available data, including medical history, events of note, physical examination and test results as part of my evaluation. I have discussed with resident/NP and other care providers such as pharmacist, RN and RRT. In addition, I personally evaluated patient and elicited key findings of: awakens, FC, int some anger, lungs clear, less edema, doppler leg was neg 29th, min setting on vent, weaning aggressive cpap 5, PS 12 needs to be reduced, precedex low dose needed, can extubat eon low dose precedex if needed, need even to neg balance, lasix daily, may need to escalate this, drains out, ID  ABX, following gram pos orgaism, feeding, need BM, lactulose increase, bili noted, alcoholic hepatitis, steroids per GI, add rifax , feel haptic encpeh big issue, reduce benzo The patient is critically ill with multiple organ systems failure and requires high complexity decision making for assessment and support, frequent evaluation and titration of therapies, application of advanced monitoring technologies and extensive interpretation of multiple databases.   Critical Care Time devoted to patient care services described in this note is 30  Minutes. This time reflects time of care of this signee: Merrie Roof, MD FACP. This  critical care time does not reflect procedure time, or teaching time or supervisory time of PA/NP/Med student/Med Resident etc but could involve care discussion time. Rest per NP/medical resident whose note is outlined above and that I agree with   Lavon Paganini. Titus Mould, MD, [place] Pgr: [facility] 11/03/2016 10:58 AM

## 2016-11-03 NOTE — Progress Notes (Signed)
PT Cancellation Note  Patient Details Name: [name] MRN: [id number] DOB: 1981/03/11   Cancelled Treatment:    Reason Eval/Treat Not Completed: Medical issues which prohibited therapy. Pt self extubating and not appropriate at this time. PT to return as able when appropriate.    Doroteo Bradford Tahisha Hakim 11/03/2016, 2:12 PM   Tracie Harrier, SPT Acute Rehab SPT [phone]

## 2016-11-03 NOTE — Progress Notes (Signed)
Patient is a 36 YO male with hx of Hepatitis C and ETOH abuse. CSW following for disposition and substance abuse resources once medically appropriate. Patient remains intubated and sedated at this time.    Lorrine Kin, MSW, LCSW [facility] ED/58M Clinical Social Worker [phone]

## 2016-11-03 NOTE — Progress Notes (Signed)
Orthopedics Progress Note  Subjective: Patient remains intubated and sedated  Objective:  Vitals:   11/03/16 0439 11/03/16 0500  BP:  107/69  Pulse:  (!) 56  Resp:    Temp: 97.5 F (36.4 C)     General: Awake and alert  Musculoskeletal: opened eyes Right thigh dressing changed and drains removed (output less than 30cc for 12 hours) Wound looks good with no drainage and no erythema. Mod edema in the lower leg.  Neurovascularly intact  Lab Results  Component Value Date   WBC 9.9 11/03/2016   HGB 9.3 (L) 11/03/2016   HCT 28.2 (L) 11/03/2016   MCV 99.3 11/03/2016   PLT 99 (L) 11/03/2016       Component Value Date/Time   NA 138 11/03/2016 0456   K 3.5 11/03/2016 0456   CL 105 11/03/2016 0456   CO2 24 11/03/2016 0456   GLUCOSE 109 (H) 11/03/2016 0456   BUN 18 11/03/2016 0456   CREATININE 0.65 11/03/2016 0456   CALCIUM 8.3 (L) 11/03/2016 0456   GFRNONAA >60 11/03/2016 0456   GFRAA >60 11/03/2016 0456    Lab Results  Component Value Date   INR 2.00 11/03/2016   INR 1.88 11/01/2016   INR 1.81 11/01/2016    Assessment/Plan: POD #2s/p Procedure(s): DECOMPRESSIVE POSTERIOR LUMBAR LAMINECTOMY LEVEL 4-5 IRRIGATION AND DEBRIDEMENT ABSCESS RIGHT THIGH Drains out.  Daily dry dressing change.  Doppler pending for the leg to eval for DVT Not sure where the culture info is for the leg cultures Will follow -   Doran Heater. Veverly Fells, MD 11/03/2016 7:36 AM

## 2016-11-03 NOTE — Progress Notes (Signed)
[facility] ADULT ICU REPLACEMENT PROTOCOL FOR AM LAB REPLACEMENT ONLY  The patient does apply for the [facility] Adult ICU Electrolyte Replacment Protocol based on the criteria listed below:   1. Is GFR >/= 40 ml/min? Yes.    Patient's GFR today is >60 2. Is urine output >/= 0.5 ml/kg/hr for the last 6 hours? Yes.   Patient's UOP is 1.31 ml/kg/hr 3. Is BUN < 60 mg/dL? Yes  Patient's BUN today is 18 4. Abnormal electrolyte  K 3.5 5. Ordered repletion with: per protocol 6. If a panic level lab has been reported, has the CCM MD in charge been notified? Yes.  .   Physician:  Rosalia Hammers 11/03/2016 5:47 AM

## 2016-11-03 NOTE — Progress Notes (Signed)
    Subjective: Procedure(s) (LRB): DECOMPRESSIVE POSTERIOR LUMBAR LAMINECTOMY LEVEL 4-5 (N/A) IRRIGATION AND DEBRIDEMENT ABSCESS RIGHT THIGH (Right) 2 Days Post-Op  Patient remains intubated.   Awake and following commands  Objective: Vital signs in last 24 hours: Temp:  [97.5 F (36.4 C)-99.1 F (37.3 C)] 98.3 F (36.8 C) (02/06 1216) Pulse Rate:  [54-76] 76 (02/06 1200) Resp:  [18-20] 18 (02/06 1200) BP: (99-155)/(53-110) 155/77 (02/06 1200) SpO2:  [92 %-100 %] 97 % (02/06 1201) Arterial Line BP: (102-143)/(45-70) 137/63 (02/06 1100) FiO2 (%):  [40 %] 40 % (02/06 1201)  Intake/Output from previous day: 02/05 0701 - 02/06 0700 In: 3818.6 [I.V.:2969.3; NG/GT:149.3; IV Piggyback:700] Out: 2405 [Urine:2200; Drains:205]  Labs:  Recent Labs  11/02/16 0153 11/03/16 0456  WBC 13.5* 9.9  RBC 2.60* 2.84*  HCT 25.3* 28.2*  PLT 107* 99*    Recent Labs  11/02/16 0153 11/03/16 0456  NA 137 138  K 4.2 3.5  CL 107 105  CO2 24 24  BUN 19 18  CREATININE 0.83 0.65  GLUCOSE 117* 109*  CALCIUM 7.8* 8.3*    Recent Labs  11/03/16 0456 11/03/16 0754  INR 2.00 2.05    Physical Exam: ABD soft Incision: dressing C/D/I Compartment soft  Assessment/Plan: Patient stable  xrays n/a Continue mobilization with physical therapy Continue care  D/c spine drain Continue ICU care Abx regimen per ID team. Will monitor clinical exam  Melina Schools, MD [place] [phone]

## 2016-11-04 DIAGNOSIS — B954 Other streptococcus as the cause of diseases classified elsewhere: Secondary | ICD-10-CM

## 2016-11-04 DIAGNOSIS — R748 Abnormal levels of other serum enzymes: Secondary | ICD-10-CM

## 2016-11-04 DIAGNOSIS — M4620 Osteomyelitis of vertebra, site unspecified: Secondary | ICD-10-CM

## 2016-11-04 DIAGNOSIS — K759 Inflammatory liver disease, unspecified: Secondary | ICD-10-CM

## 2016-11-04 LAB — GLUCOSE, CAPILLARY
GLUCOSE-CAPILLARY: 104 mg/dL — AB (ref 65–99)
GLUCOSE-CAPILLARY: 115 mg/dL — AB (ref 65–99)
GLUCOSE-CAPILLARY: 116 mg/dL — AB (ref 65–99)
GLUCOSE-CAPILLARY: 122 mg/dL — AB (ref 65–99)
Glucose-Capillary: 134 mg/dL — ABNORMAL HIGH (ref 65–99)
Glucose-Capillary: 105 mg/dL — ABNORMAL HIGH (ref 65–99)
Glucose-Capillary: 105 mg/dL — ABNORMAL HIGH (ref 65–99)

## 2016-11-04 LAB — BLOOD GAS, ARTERIAL
Acid-Base Excess: 0.2 mmol/L (ref 0.0–2.0)
BICARBONATE: 24 mmol/L (ref 20.0–28.0)
Drawn by: 406621
O2 CONTENT: 4 L/min
O2 Saturation: 89.6 %
PCO2 ART: 36.7 mmHg (ref 32.0–48.0)
PH ART: 7.43 (ref 7.350–7.450)
PO2 ART: 60 mmHg — AB (ref 83.0–108.0)
Patient temperature: 98.6

## 2016-11-04 LAB — BASIC METABOLIC PANEL
ANION GAP: 9 (ref 5–15)
BUN: 26 mg/dL — ABNORMAL HIGH (ref 6–20)
CO2: 23 mmol/L (ref 22–32)
Calcium: 8.5 mg/dL — ABNORMAL LOW (ref 8.9–10.3)
Chloride: 108 mmol/L (ref 101–111)
Creatinine, Ser: 0.96 mg/dL (ref 0.61–1.24)
Glucose, Bld: 136 mg/dL — ABNORMAL HIGH (ref 65–99)
POTASSIUM: 4.2 mmol/L (ref 3.5–5.1)
Sodium: 140 mmol/L (ref 135–145)

## 2016-11-04 LAB — CBC
HEMATOCRIT: 28.1 % — AB (ref 39.0–52.0)
Hemoglobin: 9 g/dL — ABNORMAL LOW (ref 13.0–17.0)
MCH: 32.6 pg (ref 26.0–34.0)
MCHC: 32 g/dL (ref 30.0–36.0)
MCV: 101.8 fL — AB (ref 78.0–100.0)
Platelets: 119 10*3/uL — ABNORMAL LOW (ref 150–400)
RBC: 2.76 MIL/uL — AB (ref 4.22–5.81)
RDW: 18.3 % — ABNORMAL HIGH (ref 11.5–15.5)
WBC: 16.1 10*3/uL — AB (ref 4.0–10.5)

## 2016-11-04 LAB — HEPATIC FUNCTION PANEL
ALBUMIN: 1.8 g/dL — AB (ref 3.5–5.0)
ALT: 69 U/L — AB (ref 17–63)
AST: 163 U/L — AB (ref 15–41)
Alkaline Phosphatase: 116 U/L (ref 38–126)
BILIRUBIN DIRECT: 5.3 mg/dL — AB (ref 0.1–0.5)
Indirect Bilirubin: 5 mg/dL — ABNORMAL HIGH (ref 0.3–0.9)
Total Bilirubin: 10.3 mg/dL — ABNORMAL HIGH (ref 0.3–1.2)
Total Protein: 5.6 g/dL — ABNORMAL LOW (ref 6.5–8.1)

## 2016-11-04 LAB — PROTIME-INR
INR: 2.15
Prothrombin Time: 24.4 seconds — ABNORMAL HIGH (ref 11.4–15.2)

## 2016-11-04 LAB — HIV ANTIBODY (ROUTINE TESTING W REFLEX): HIV Screen 4th Generation wRfx: NONREACTIVE

## 2016-11-04 MED ORDER — SODIUM CHLORIDE 0.9 % IJ SOLN
INTRAMUSCULAR | Status: AC
  Filled 2016-11-04: qty 10

## 2016-11-04 MED ORDER — ARTIFICIAL TEARS OP OINT
TOPICAL_OINTMENT | OPHTHALMIC | Status: AC
  Filled 2016-11-04: qty 3.5

## 2016-11-04 MED ORDER — VITAMIN K1 10 MG/ML IJ SOLN
10.0000 mg | Freq: Once | INTRAVENOUS | Status: AC
  Administered 2016-11-04: 10 mg via INTRAVENOUS
  Filled 2016-11-04: qty 1

## 2016-11-04 MED ORDER — DEXMEDETOMIDINE HCL IN NACL 200 MCG/50ML IV SOLN
0.0000 ug/kg/h | INTRAVENOUS | Status: DC
  Administered 2016-11-05 (×3): 2 ug/kg/h via INTRAVENOUS
  Administered 2016-11-05: 0.8 ug/kg/h via INTRAVENOUS
  Filled 2016-11-04 (×4): qty 50

## 2016-11-04 MED ORDER — VITAMIN B-1 100 MG PO TABS
100.0000 mg | ORAL_TABLET | Freq: Every day | ORAL | Status: DC
  Administered 2016-11-05 – 2016-11-06 (×2): 100 mg via ORAL
  Filled 2016-11-04 (×2): qty 1

## 2016-11-04 MED ORDER — ONDANSETRON HCL 4 MG/2ML IJ SOLN
INTRAMUSCULAR | Status: AC
  Filled 2016-11-04: qty 2

## 2016-11-04 MED ORDER — CHLORHEXIDINE GLUCONATE 0.12 % MT SOLN
15.0000 mL | Freq: Two times a day (BID) | OROMUCOSAL | Status: DC
  Administered 2016-11-05 – 2016-11-12 (×14): 15 mL via OROMUCOSAL
  Filled 2016-11-04 (×13): qty 15

## 2016-11-04 MED ORDER — PANTOPRAZOLE SODIUM 40 MG PO PACK
40.0000 mg | PACK | Freq: Every day | ORAL | Status: DC
  Administered 2016-11-05 – 2016-11-12 (×8): 40 mg via ORAL
  Filled 2016-11-04 (×7): qty 20

## 2016-11-04 MED ORDER — ORAL CARE MOUTH RINSE
15.0000 mL | Freq: Two times a day (BID) | OROMUCOSAL | Status: DC
  Administered 2016-11-04 – 2016-11-08 (×7): 15 mL via OROMUCOSAL

## 2016-11-04 MED ORDER — ADULT MULTIVITAMIN W/MINERALS CH
1.0000 | ORAL_TABLET | Freq: Every day | ORAL | Status: DC
  Administered 2016-11-05 – 2016-11-12 (×8): 1 via ORAL
  Filled 2016-11-04 (×8): qty 1

## 2016-11-04 MED ORDER — PHENYLEPHRINE 40 MCG/ML (10ML) SYRINGE FOR IV PUSH (FOR BLOOD PRESSURE SUPPORT)
PREFILLED_SYRINGE | INTRAVENOUS | Status: AC
  Filled 2016-11-04: qty 10

## 2016-11-04 MED ORDER — LABETALOL HCL 5 MG/ML IV SOLN
10.0000 mg | INTRAVENOUS | Status: DC | PRN
  Administered 2016-11-04 (×2): 10 mg via INTRAVENOUS
  Filled 2016-11-04 (×3): qty 4

## 2016-11-04 MED ORDER — FOLIC ACID 1 MG PO TABS
1.0000 mg | ORAL_TABLET | Freq: Every day | ORAL | Status: DC
  Administered 2016-11-05 – 2016-11-06 (×2): 1 mg via ORAL
  Filled 2016-11-04 (×2): qty 1

## 2016-11-04 NOTE — Evaluation (Signed)
Physical Therapy Evaluation Patient Details Name: [name] MRN: [id number] DOB: [birth year] Today's Date: 11/04/2016   History of Present Illness  36 yo male developed swelling in Rt leg and foot, and back pain.  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  He was seen by orthopedics, and started on IV abx. 2/04 surgery for decompressive posterior lumbar laminectomy L4-5 and I&D of right thigh abscess  He has hx of Hepatitis C and ETOH.  He was noted to have jaundice and was seen by GI.  He was started on prednisone for alcoholic hepatitis.  He was also seen by ID.  He developed progressive altered mental status due to delirium tremens.  He was transferred to ICU and intubated 2/3; self extubated 2/7.     Clinical Impression  Pt admitted with above. Pt was indep PTA now requires modAx1-2 for all mobility and demo's periods of hallucinations however easily redirected and does consistently follow simple 1 step commands. Pt to benefit from [name] upon d/c to maximlze functional recovery for safe transition home.    Follow Up Recommendations [name]    Equipment Recommendations   (TBD)    Recommendations for Other Services Rehab consult     Precautions / Restrictions Precautions Precautions: Back Precaution Booklet Issued: No Precaution Comments: pt hallucinating and confused and unable to retain info Restrictions Weight Bearing Restrictions: No      Mobility  Bed Mobility Overal bed mobility: Needs Assistance Bed Mobility: Sidelying to Sit;Rolling Rolling: Mod assist;Min assist Sidelying to sit: Mod assist       General bed mobility comments: max directional v/c's for sequencing, modA for trunk elevation  Transfers Overall transfer level: Needs assistance Equipment used: 2 person hand held assist (with gait belt) Transfers: Sit to/from Stand;Stand Pivot Transfers Sit to Stand: Mod assist;Max assist;+2 physical  assistance;+2 safety/equipment Stand pivot transfers: +2 physical assistance;+2 safety/equipment       General transfer comment: pt very shaky, limited R LE foot clearance and decreased R LE WBing  Ambulation/Gait             General Gait Details: not safe at this time  Financial trader Rankin (Stroke Patients Only)       Balance Overall balance assessment: Needs assistance Sitting-balance support: Feet supported;Bilateral upper extremity supported Sitting balance-Leahy Scale: Poor Sitting balance - Comments: pt shaky, requires bilat UE support   Standing balance support: Bilateral upper extremity supported Standing balance-Leahy Scale: Poor Standing balance comment: reliant on bilat HHA and physical assist                             Pertinent Vitals/Pain Pain Assessment: 0-10 Pain Score: 4  Pain Location: back, R LE Pain Descriptors / Indicators: Sore Pain Intervention(s): Monitored during session    Home Living Family/patient expects to be discharged to:: Unsure Living Arrangements:  (girlfriend)               Additional Comments: pt poor historian due to confusion    Prior Function Level of Independence: Independent         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[employer]        Extremity/Trunk Assessment   Upper Extremity Assessment Upper Extremity Assessment: Generalized weakness (very shaky during  mobility)    Lower Extremity Assessment Lower Extremity Assessment: Generalized weakness (very shaky during mobility)    Cervical / Trunk Assessment Cervical / Trunk Assessment: Other exceptions Cervical / Trunk Exceptions: back surgery  Communication   Communication: No difficulties  Cognition Arousal/Alertness: Awake/alert Behavior During Therapy: Restless;Impulsive Overall Cognitive Status: Impaired/Different from baseline Area of Impairment:  Orientation;Attention;Memory;Following commands;Safety/judgement;Awareness;Problem solving Orientation Level: Disoriented to;Time (reports [name] and 2018 and said in hosptial d/t pain) Current Attention Level: Sustained Memory: Decreased recall of precautions Following Commands: Follows one step commands with increased time;Follows one step commands consistently Safety/Judgement: Decreased awareness of safety;Decreased awareness of deficits Awareness: Intellectual Problem Solving: Slow processing;Requires verbal cues;Difficulty sequencing;Requires tactile cues General Comments: pt with periods of hallucinations ie. wrap the stuff up in the safe, someones trying to choke me but pt easily re-directed    General Comments General comments (skin integrity, edema, etc.): pt with R LE ace wrapped    Exercises     Assessment/Plan    PT Assessment Patient needs continued PT services  PT Problem List Decreased strength;Decreased range of motion;Decreased activity tolerance;Decreased balance;Decreased safety awareness;Decreased cognition;Pain          PT Treatment Interventions DME instruction;Gait training;Stair training;Functional mobility training;Therapeutic activities;Therapeutic exercise;Cognitive remediation;Neuromuscular re-education;Balance training    PT Goals (Current goals can be found in the Care Plan section)  Acute Rehab PT Goals Patient Stated Goal: didn't state PT Goal Formulation: Patient unable to participate in goal setting Time For Goal Achievement: 11/18/16 Potential to Achieve Goals: Good    Frequency Min 4X/week   Barriers to discharge        Co-evaluation PT/OT/SLP Co-Evaluation/Treatment: Yes Reason for Co-Treatment: Complexity of the patient's impairments (multi-system involvement);For patient/therapist safety PT goals addressed during session: Mobility/safety with mobility         End of Session Equipment Utilized During Treatment: Gait  belt;Oxygen (6LO2 via [place]) Activity Tolerance: Patient tolerated treatment well   Nurse Communication: Mobility status         Time: 1333-1400 PT Time Calculation (min) (ACUTE ONLY): 27 min   Charges:   PT Evaluation $PT Eval Moderate Complexity: 1 Procedure     PT G Codes:        [name] 11/04/2016, 2:14 PM   [name], PT, DPT Pager #: [phone] Office #: [phone]

## 2016-11-04 NOTE — Progress Notes (Signed)
Rehab Admissions Coordinator Note:  Patient was screened by Cleatrice Burke for appropriateness for an Inpatient Acute Rehab Consult per PT recommendation.  At this time, we are recommending [place] if pt is an active IVDU to be able to receive his long term antibiotcs safely that he will require.  Cleatrice Burke 11/04/2016, 3:45 PM  I can be reached at [phone].

## 2016-11-04 NOTE — Progress Notes (Signed)
Physical Therapy Treatment Patient Details Name: [name] MRN: [id number] DOB: 02-06-1981 Today's Date: 11/04/2016    History of Present Illness 36 yo male developed swelling in Rt leg and foot, and back pain.  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  He was seen by orthopedics, and started on IV abx. 2/04 surgery for decompressive posterior lumbar laminectomy L4-5 and I&D of right thigh abscess  He has hx of Hepatitis C and ETOH.  He was noted to have jaundice and was seen by GI.  He was started on prednisone for alcoholic hepatitis.  He was also seen by ID.  He developed progressive altered mental status due to delirium tremens.  He was transferred to ICU and intubated 2/3; self extubated 2/7.       PT Comments    PT asked to return to assist pt back to bed. Pt with increased anxiety and hallucinations. Acute PT to con't to follow.  Follow Up Recommendations  [name]     Equipment Recommendations  None recommended by PT    Recommendations for Other Services Rehab consult     Precautions / Restrictions Precautions Precautions: Back Precaution Booklet Issued: No Precaution Comments: pt hallucinating and confused and unable to retain info Restrictions Weight Bearing Restrictions: No    Mobility  Bed Mobility Overal bed mobility: Needs Assistance Bed Mobility: Sit to Sidelying Rolling: Mod assist;Min assist Sidelying to sit: Mod assist     Sit to sidelying: Mod assist;+2 for physical assistance;+2 for safety/equipment General bed mobility comments: max directional v/c's for sequencing, modA for trunk management and LE management back into bed  Transfers Overall transfer level: Needs assistance Equipment used: 2 person hand held assist Transfers: Sit to/from [employer] Transfers Sit to Stand: Mod assist;Max assist;+2 physical assistance;+2 safety/equipment Stand pivot transfers: +2 physical  assistance;+2 safety/equipment;Max assist       General transfer comment: pt with increased anxiety, reaching for everything and increased difficulty following commands. Pt required 2 attempts to return to bed  Ambulation/Gait             General Gait Details: no safe   Stairs            Wheelchair Mobility    Modified Rankin (Stroke Patients Only)       Balance Overall balance assessment: Needs assistance Sitting-balance support: Feet supported;Bilateral upper extremity supported Sitting balance-Leahy Scale: Poor Sitting balance - Comments: pt shaky, requires bilat UE support   Standing balance support: Bilateral upper extremity supported Standing balance-Leahy Scale: Poor Standing balance comment: reliant on bilat HHA and physical assist                    Cognition Arousal/Alertness: Awake/alert Behavior During Therapy: Restless;Impulsive Overall Cognitive Status: Impaired/Different from baseline Area of Impairment: Orientation;Attention;Memory;Following commands;Safety/judgement;Awareness;Problem solving Orientation Level: Disoriented to;Time Current Attention Level: Sustained Memory: Decreased recall of precautions Following Commands: Follows one step commands with increased time;Follows one step commands consistently Safety/Judgement: Decreased awareness of safety;Decreased awareness of deficits Awareness: Intellectual Problem Solving: Slow processing;Requires verbal cues;Difficulty sequencing;Requires tactile cues General Comments: pt with periods of hallucinations ie. 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[employer]   PT Goals (current goals can now be found in the care plan section) Acute Rehab PT Goals Patient Stated Goal: didn't state PT Goal Formulation: Patient unable to participate in goal setting Time For Goal Achievement: 11/18/16 Potential to Achieve Goals: Good Progress towards PT goals: Progressing toward goals    Frequency    Min 4X/week      PT Plan Current plan remains appropriate    Co-evaluation PT/OT/SLP Co-Evaluation/Treatment: Yes Reason for Co-Treatment: Complexity of the patient's impairments (multi-system involvement);For patient/therapist safety;To address functional/ADL transfers PT goals addressed during session: Mobility/safety with mobility OT goals addressed during session: ADL's and self-care     End of Session Equipment Utilized During Treatment: Gait belt;Oxygen Activity Tolerance: Patient tolerated treatment well Patient left: in bed;with call bell/phone within reach;with restraints reapplied;with bed alarm set     Time: [id number] PT Time Calculation (min) (ACUTE ONLY): 10 min  Charges:  $Therapeutic Activity: 8-22 mins                    G Codes:      Berline Lopes 12/03/16, 4:35 PM   Kittie Plater, PT, DPT Pager #: [phone] Office #: [phone]

## 2016-11-04 NOTE — Progress Notes (Signed)
Pt self-extubated while on Fentanyl and Precedex with bilateral wrist restraints. Stopped Fentanyl and Precedex. Notified respiratory therapy and MD. Pt on 15L non-rebreather. Pt lethargic, but follows commands. NP present at bedside. Will continue to monitor.

## 2016-11-04 NOTE — Progress Notes (Signed)
Subjective: This morning, he self-extubated. He couldn't recall the details prior to admission but acknowledged that he felt better. He denied any complaints. I reviewed his hospital course with him and recommended he finish his treatment.   Of note, he acknowledged NOT completing his HCV treatment as opposed to having completed treatment which would suggest inadequate response to therapy.   Antibiotics:  Anti-infectives    Start     Dose/Rate Route Frequency Ordered Stop   11/03/16 1200  rifaximin (XIFAXAN) tablet 400 mg     400 mg Oral Every 8 hours 11/03/16 1109     11/03/16 1115  rifaximin (XIFAXAN) tablet 400 mg  Status:  Discontinued     400 mg Oral 4 times daily 11/03/16 1103 11/03/16 1109   11/02/16 2200  ceFEPIme (MAXIPIME) 2 g in dextrose 5 % 50 mL IVPB  Status:  Discontinued     2 g 100 mL/hr over 30 Minutes Intravenous Every 8 hours 11/02/16 1452 11/02/16 1756   11/02/16 1800  cefTRIAXone (ROCEPHIN) 2 g in dextrose 5 % 50 mL IVPB     2 g 100 mL/hr over 30 Minutes Intravenous Every 24 hours 11/02/16 1756     11/01/16 2200  ceFEPIme (MAXIPIME) 2 g in dextrose 5 % 50 mL IVPB  Status:  Discontinued     2 g 100 mL/hr over 30 Minutes Intravenous Every 12 hours 11/01/16 1520 11/02/16 1452   11/01/16 1800  vancomycin (VANCOCIN) IVPB 1000 mg/200 mL premix  Status:  Discontinued     1,000 mg 200 mL/hr over 60 Minutes Intravenous Every 8 hours 11/01/16 1520 11/04/16 1217   11/01/16 1346  vancomycin (VANCOCIN) powder  Status:  Discontinued       As needed 11/01/16 1346 11/01/16 1347   10/30/16 1000  ceFEPIme (MAXIPIME) 2 g in dextrose 5 % 50 mL IVPB  Status:  Discontinued     2 g 100 mL/hr over 30 Minutes Intravenous Every 12 hours 10/30/16 0939 11/01/16 1515   10/29/16 2200  vancomycin (VANCOCIN) IVPB 1000 mg/200 mL premix  Status:  Discontinued     1,000 mg 200 mL/hr over 60 Minutes Intravenous Every 8 hours 10/29/16 1412 11/01/16 1515   10/29/16 2200   piperacillin-tazobactam (ZOSYN) IVPB 3.375 g  Status:  Discontinued     3.375 g 12.5 mL/hr over 240 Minutes Intravenous Every 8 hours 10/29/16 1412 10/29/16 1430   10/29/16 1300  vancomycin (VANCOCIN) IVPB 1000 mg/200 mL premix     1,000 mg 200 mL/hr over 60 Minutes Intravenous  Once 10/29/16 1237 10/29/16 1519   10/29/16 1300  piperacillin-tazobactam (ZOSYN) IVPB 3.375 g     3.375 g 100 mL/hr over 30 Minutes Intravenous  Once 10/29/16 1237 10/29/16 1340      Medications: Scheduled Meds: . cefTRIAXone (ROCEPHIN)  IV  2 g Intravenous Q24H  . chlorhexidine  15 mL Mouth Rinse BID  . Chlorhexidine Gluconate Cloth  6 each Topical Daily  . [START ON 123456 folic acid  1 mg Oral Daily  . lactulose  30 g Oral Q6H  . LORazepam  1 mg Intravenous Q8H  . mouth rinse  15 mL Mouth Rinse q12n4p  . [START ON 11/05/2016] multivitamin with minerals  1 tablet Oral Daily  . [START ON 11/05/2016] pantoprazole sodium  40 mg Oral Daily  . rifaximin  400 mg Oral Q8H  . sodium chloride flush  10-40 mL Intracatheter Q12H  . [START ON 11/05/2016] thiamine  100  mg Oral Daily   Continuous Infusions: . sodium chloride 75 mL/hr at 11/04/16 1600   PRN Meds:.fentaNYL, labetalol, midazolam, ondansetron **OR** ondansetron (ZOFRAN) IV, sodium chloride flush    Objective: Weight change:   Intake/Output Summary (Last 24 hours) at 11/04/16 1648 Last data filed at 11/04/16 1600  Gross per 24 hour  Intake          3189.24 ml  Output             2835 ml  Net           354.24 ml   Blood pressure (!) 176/90, pulse (!) 114, temperature 97.4 F (36.3 C), temperature source Oral, resp. rate 17, height 5\' 11"  (1.803 m), weight 266 lb 1.6 oz (120.7 kg), SpO2 97 %. Temp:  [97.4 F (36.3 C)-101.1 F (38.4 C)] 97.4 F (36.3 C) (02/07 1614) Pulse Rate:  [72-124] 114 (02/07 1600) Resp:  [17-21] 17 (02/07 0756) BP: (93-176)/(53-91) 176/90 (02/07 1600) SpO2:  [90 %-99 %] 97 % (02/07 1600) Arterial Line BP:  (97-173)/(39-108) 151/71 (02/07 1100) FiO2 (%):  [40 %] 40 % (02/07 0700)  Physical Exam: General: young man, resting in bed, no acute distress, somewhat drowsy appearing HEENT: PERRL, EOMI, scleral icterus, oropharynx with some discoloration of the uvula likely from intubation, left IJ catheter in place Cardiac: tachycardic, no rubs, murmurs or gallops Pulm: clear to auscultation bilaterally in the anterior lung fields, no wheezes, rales, or rhonchi Abd: soft, nontender, nondistended, BS present Ext: warm and well perfused, LLE covered in bandage Neuro: responds to questions appropriately; moving all extremities freely  BMET  Recent Labs  11/03/16 0456 11/04/16 0335  NA 138 140  K 3.5 4.2  CL 105 108  CO2 24 23  GLUCOSE 109* 136*  BUN 18 26*  CREATININE 0.65 0.96  CALCIUM 8.3* 8.5*     Liver Panel   Recent Labs  11/03/16 0754 11/04/16 0335  PROT 6.4* 5.6*  ALBUMIN 1.8* 1.8*  AST 162* 163*  ALT 73* 69*  ALKPHOS 123 116  BILITOT 10.1* 10.3*  BILIDIR 5.1* 5.3*  IBILI 5.0* 5.0*    Micro Results: Recent Results (from the past 720 hour(s))  Culture, blood (routine x 2)     Status: None   Collection Time: 10/29/16 12:52 PM  Result Value Ref Range Status   Specimen Description BLOOD RIGHT ANTECUBITAL  Final   Special Requests BOTTLES DRAWN AEROBIC AND ANAEROBIC 5 ML  Final   Culture   Final    NO GROWTH 5 DAYS Performed at [facility], [address] [place], [place] [address]    Report Status 11/03/2016 FINAL  Final  Culture, blood (routine x 2)     Status: None   Collection Time: 10/29/16 12:56 PM  Result Value Ref Range Status   Specimen Description BLOOD LEFT HAND  Final   Special Requests IN PEDIATRIC BOTTLE 2 ML  Final   Culture   Final    NO GROWTH 5 DAYS Performed at [facility], [place] [address], [place], [place] [address]    Report Status 11/03/2016 FINAL  Final  MRSA PCR Screening     Status: None   Collection Time: 10/29/16  9:30  PM  Result Value Ref Range Status   MRSA by PCR NEGATIVE NEGATIVE Final    Comment:        The GeneXpert MRSA Assay (FDA approved for NASAL specimens only), is one component of a comprehensive MRSA colonization surveillance  program. It is not intended to diagnose MRSA infection nor to guide or monitor treatment for MRSA infections.   Aerobic/Anaerobic Culture (surgical/deep wound)     Status: None (Preliminary result)   Collection Time: 11/01/16 11:23 AM  Result Value Ref Range Status   Specimen Description ABSCESS RIGHT THIGH  Final   Special Requests POSTERIOR THIGH  Final   Gram Stain   Final    FEW WBC PRESENT, PREDOMINANTLY PMN RARE GRAM POSITIVE COCCI IN PAIRS    Culture   Final    FEW VIRIDANS STREPTOCOCCUS NO ANAEROBES ISOLATED; CULTURE IN PROGRESS FOR 5 DAYS    Report Status PENDING  Incomplete  Aerobic/Anaerobic Culture (surgical/deep wound)     Status: None (Preliminary result)   Collection Time: 11/01/16 11:42 AM  Result Value Ref Range Status   Specimen Description ABSCESS RIGHT BACK  Final   Special Requests   Final    POSTERIOR LATERAL LUMBAR L4 L5 ABSCESS POF CEFATIN AND VANC   Gram Stain   Final    MODERATE WBC PRESENT, PREDOMINANTLY PMN NO ORGANISMS SEEN    Culture FEW STREPTOCOCCUS INTERMEDIUS  Final   Report Status PENDING  Incomplete  Aerobic/Anaerobic Culture (surgical/deep wound)     Status: None (Preliminary result)   Collection Time: 11/01/16 12:01 PM  Result Value Ref Range Status   Specimen Description WOUND BACK  Final   Special Requests LUMBAR L4 L5 FACET SPEC C POF CEFTIN AND VANC  Final   Gram Stain   Final    MODERATE WBC PRESENT, PREDOMINANTLY PMN NO ORGANISMS SEEN    Culture   Final    FEW VIRIDANS STREPTOCOCCUS NO ANAEROBES ISOLATED; CULTURE IN PROGRESS FOR 5 DAYS    Report Status PENDING  Incomplete  Aerobic/Anaerobic Culture (surgical/deep wound)     Status: None (Preliminary result)   Collection Time: 11/01/16  1:47 PM    Result Value Ref Range Status   Specimen Description TISSUE  Final   Special Requests   Final    L4 L5 DISC MATERIAL SPECIMEN CUP D PATIENT ON FOLLOWING  CEFTIN AND VANCOMYCIN    Gram Stain   Final    RARE WBC PRESENT, PREDOMINANTLY MONONUCLEAR NO ORGANISMS SEEN    Culture   Final    NO GROWTH 3 DAYS NO ANAEROBES ISOLATED; CULTURE IN PROGRESS FOR 5 DAYS   Report Status PENDING  Incomplete    Studies/Results: Dg Chest Port 1 View  Result Date: 11/03/2016 CLINICAL DATA:  Repositioning of endotracheal tube. Tube was pulled earlier today. EXAM: PORTABLE CHEST 1 VIEW COMPARISON:  11/01/2016 FINDINGS: Tip of endotracheal tube is 3.3 cm above the carina in satisfactory position. Left IJ central line catheter is noted at the cavoatrial junction. No pneumothorax. Gastric tube extends below the left hemidiaphragm and appears to coil back towards the expected location of the gastric cardia. Low lung volumes with crowding of interstitial lung markings. Stable cardiomegaly retrocardiac consolidations are not entirely excluded. IMPRESSION: 1. Satisfactory support line and tube positions. 2. Stable cardiomegaly with mild interstitial edema. Cannot exclude trace right effusion nor retrocardiac pulmonary consolidation versus atelectasis. Electronically Signed   By: Ashley Royalty M.D.   On: 11/03/2016 14:59   Dg Abd Portable 1v  Result Date: 11/03/2016 CLINICAL DATA:  OG tube placement EXAM: PORTABLE ABDOMEN - 1 VIEW COMPARISON:  11/01/2016 FINDINGS: Esophageal tube is looped in the stomach, the tip and side port overlie the mid to distal stomach. Gas-filled slightly enlarged bowel loops in the upper abdomen. Improved  aeration of left lung base. IMPRESSION: Esophageal tube tip projects over the mid to distal stomach Electronically Signed   By: Donavan Foil M.D.   On: 11/03/2016 23:35      Assessment/Plan:    Principal Problem:   Epidural abscess Active Problems:   Liver failure (HCC)   Hyponatremia    Cellulitis of right lower extremity   EtOH dependence (HCC)   Thrombocytopenia (HCC)   Cirrhosis (HCC)   Paraspinal abscess (HCC)   Chronic hepatitis C without hepatic coma (HCC)   Hepatitis   Abnormal transaminases   Alcoholic hepatitis without ascites   ETOH abuse   Abscess of right lower extremity   Cellulitis and abscess of right lower extremity   Hypernatremia   Delirium tremens ([place])   Infection of lumbar spine (HCC)  [name] is a 36 y.o. 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  Lumbar diskitis/osteomyelitis and right thigh abscess s/p debridement POD 3. Cultures now with Strep Viridans and Strep intermedius, no MRSA. Narrow to ceftriaxone [Abx Day 7]  Alcoholic cirrhosis complicated by chronic HCV infection: It is important to note that his persistent viremia is from incomplete treatment as opposed to incomplete response to treatment.  -Follow-up repeat HCV VL -GI following, appreciate recommendations    LOS: 6 days   Charlott Rakes 11/04/2016, 4:48 PM

## 2016-11-04 NOTE — Progress Notes (Addendum)
eLink Physician-Brief Progress Note Patient Name: [name] DOB: [birth year] MRN: [id number]   Date of Service  11/04/2016  HPI/Events of Note  Self extubated Protecting airway for now  eICU Interventions  Cut off sedation Monitor for intubation needs May need to restart precedex if he becomes agitated. On call team to evaluate     Intervention Category Major Interventions: Airway management  Hansford Hirt 11/04/2016, 7:08 AM

## 2016-11-04 NOTE — Evaluation (Signed)
Occupational Therapy Evaluation Patient Details Name: [name] MRN: [id number] DOB: Oct 28, 1980 Today's Date: 11/04/2016    History of Present Illness 36 yo male developed swelling in Rt leg and foot, and back pain.  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  He was seen by orthopedics, and started on IV abx. 2/04 surgery for decompressive posterior lumbar laminectomy L4-5 and I&D of right thigh abscess  He has hx of Hepatitis C and ETOH.  He was noted to have jaundice and was seen by GI.  He was started on prednisone for alcoholic hepatitis.  He was also seen by ID.  He developed progressive altered mental status due to delirium tremens.  He was transferred to ICU and intubated 2/3; self extubated 2/7.      Clinical Impression   Pt reports he was independent with ADL PTA. Currently pt max assist +2 for stand pivot transfers, min assist for seated UB ADL, and max assist for LB ADL. Pt presenting with impaired cognition, generalized weakness, pain, poor sitting and standing balance impacting his independence and safety with ADL and functional mobility. Recommending [name] level therapies to maximize independence and safety with ADL and functional mobility prior to return home. Pt would benefit from continued skilled OT to address established goals.    Follow Up Recommendations  [name];Supervision/Assistance - 24 hour    Equipment Recommendations  Other (comment) (TBD)    Recommendations for Other Services Rehab consult     Precautions / Restrictions Precautions Precautions: Back Precaution Booklet Issued: No Precaution Comments: pt hallucinating and confused and unable to retain info Restrictions Weight Bearing Restrictions: No      Mobility Bed Mobility Overal bed mobility: Needs Assistance Bed Mobility: Sidelying to Sit;Rolling Rolling: Mod assist;Min assist Sidelying to sit: Mod assist       General bed  mobility comments: max directional v/c's for sequencing, modA for trunk elevation  Transfers Overall transfer level: Needs assistance Equipment used: 2 person hand held assist (with gait belt) Transfers: Sit to/from Stand;Stand Pivot Transfers Sit to Stand: Mod assist;Max assist;+2 physical assistance;+2 safety/equipment Stand pivot transfers: +2 physical assistance;+2 safety/equipment;Max assist       General transfer comment: pt very shaky, limited R LE foot clearance and decreased R LE WBing    Balance Overall balance assessment: Needs assistance Sitting-balance support: Feet supported;Bilateral upper extremity supported Sitting balance-Leahy Scale: Poor Sitting balance - Comments: pt shaky, requires bilat UE support   Standing balance support: Bilateral upper extremity supported Standing balance-Leahy Scale: Poor Standing balance comment: reliant on bilat HHA and physical assist                            ADL Overall ADL's : Needs assistance/impaired     Grooming: Minimal assistance;Sitting;Wash/dry face Grooming Details (indicate cue type and reason): Min assist for sitting balance Upper Body Bathing: Moderate assistance;Sitting   Lower Body Bathing: Maximal assistance;+2 for physical assistance;Sit to/from stand   Upper Body Dressing : Moderate assistance;Sitting   Lower Body Dressing: Maximal assistance;+2 for physical assistance;Sit to/from stand Lower Body Dressing Details (indicate cue type and reason): Max assist to don socks in sitting Toilet Transfer: Maximal assistance;+2 for physical assistance;Stand-pivot;BSC Toilet Transfer Details (indicate cue type and reason): Simulated by stand pivot to chair         Functional mobility during ADLs: Maximal assistance;+2 for physical assistance;Cueing for safety;Cueing for sequencing (  for stand pivot only)       Vision     Perception     Praxis      Pertinent Vitals/Pain Pain Assessment:  0-10 Pain Score: 4  Pain Location: back, R LE Pain Descriptors / Indicators: Sore Pain Intervention(s): Monitored during session     Hand Dominance     Extremity/Trunk Assessment Upper Extremity Assessment Upper Extremity Assessment: Generalized weakness (very shaky during mobility)   Lower Extremity Assessment Lower Extremity Assessment: Defer to PT evaluation   Cervical / Trunk Assessment Cervical / Trunk Assessment: Other exceptions Cervical / Trunk Exceptions: back surgery   Communication Communication Communication: No difficulties   Cognition Arousal/Alertness: Awake/alert Behavior During Therapy: Restless;Impulsive Overall Cognitive Status: Impaired/Different from baseline Area of Impairment: Orientation;Attention;Memory;Following commands;Safety/judgement;Awareness;Problem solving Orientation Level: Disoriented to;Time (reports [place] and 2018 and said in hosptial d/t pain) Current Attention Level: Sustained Memory: Decreased recall of precautions Following Commands: Follows one step commands with increased time;Follows one step commands consistently Safety/Judgement: Decreased awareness of safety;Decreased awareness of deficits Awareness: Intellectual Problem Solving: Slow processing;Requires verbal cues;Difficulty sequencing;Requires tactile cues General Comments: pt with periods of hallucinations ie. 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[employer]        OT Problem List: Decreased strength;Decreased range of  motion;Decreased activity tolerance;Impaired balance (sitting and/or standing);Decreased coordination;Decreased cognition;Decreased safety awareness;Decreased knowledge of use of DME or AE;Decreased knowledge of precautions;Cardiopulmonary status limiting activity;Obesity;Impaired UE functional use;Pain;Increased edema   OT Treatment/Interventions: Self-care/ADL training;Therapeutic exercise;Energy conservation;DME and/or AE instruction;Therapeutic activities;Patient/family education;Balance training    OT Goals(Current goals can be found in the care plan section) Acute Rehab OT Goals Patient Stated Goal: didn't state OT Goal Formulation: With patient Time For Goal Achievement: 11/18/16 Potential to Achieve Goals: Good ADL Goals Pt Will Perform Lower Body Bathing: with min assist;sit to/from stand;with adaptive equipment Pt Will Perform Lower Body Dressing: with min assist;with adaptive equipment;sit to/from stand Pt Will Transfer to Toilet: with min assist;ambulating;bedside commode (over toilet) Pt Will Perform Toileting - Clothing Manipulation and hygiene: with min assist;sit to/from stand;with adaptive equipment Additional ADL Goal #1: Pt will independently verbally recall 3/3 back precautions and maintain throughout ADL. Additional ADL Goal #2: Pt will don/doff back brace with set up as precursor to ADL and functional mobility.  OT Frequency: Min 2X/week   Barriers to D/C:            Co-evaluation PT/OT/SLP Co-Evaluation/Treatment: Yes Reason for Co-Treatment: Complexity of the patient's impairments (multi-system involvement);For patient/therapist safety;To address functional/ADL transfers PT goals addressed during session: Mobility/safety with mobility OT goals addressed during session: ADL's and self-care      End of Session Equipment Utilized During Treatment: Gait belt;Oxygen Nurse Communication: Mobility status  Activity Tolerance: Patient tolerated treatment well Patient  left: in chair;with call bell/phone within reach;Other (comment) (with PT in room)   Time: [id number] OT  Time Calculation (min): 24 min Charges:  OT General Charges $OT Visit: 1 Procedure OT Evaluation $OT Eval Moderate Complexity: 1 Procedure G-Codes:     Binnie Kand M.S., OTR/L Pager: [phone]  11/04/2016, 3:41 PM

## 2016-11-04 NOTE — Progress Notes (Signed)
    Subjective: 3 Days Post-Op Procedure(s) (LRB): DECOMPRESSIVE POSTERIOR LUMBAR LAMINECTOMY LEVEL 4-5 (N/A) IRRIGATION AND DEBRIDEMENT ABSCESS RIGHT THIGH (Right) Patient reports pain as 7 on 0-10 scale.   Denies CP or SOB.  Pt still has a foley Pt is awake and extubated.  Pt answers questions and follows commands.  Objective: Vital signs in last 24 hours: Temp:  [99.1 F (37.3 C)-101.1 F (38.4 C)] 99.6 F (37.6 C) (02/07 1148) Pulse Rate:  [72-124] 100 (02/07 1200) Resp:  [17-21] 17 (02/07 0756) BP: (86-170)/(38-91) 149/88 (02/07 1200) SpO2:  [92 %-99 %] 98 % (02/07 1200) Arterial Line BP: (97-173)/(31-108) 151/71 (02/07 1100) FiO2 (%):  [40 %] 40 % (02/07 0700)  Intake/Output from previous day: 02/06 0701 - 02/07 0700 In: 3735.9 [I.V.:2845.9; NG/GT:240; IV Piggyback:650] Out: 3055 [Urine:3055] Intake/Output this shift: Total I/O In: 156.4 [I.V.:156.4] Out: 1395 [Urine:1395]  Labs:  Recent Labs  11/01/16 1848 11/02/16 0153 11/03/16 0456 11/04/16 0335  HGB 8.7* 8.7* 9.3* 9.0*    Recent Labs  11/03/16 0456 11/04/16 0335  WBC 9.9 16.1*  RBC 2.84* 2.76*  HCT 28.2* 28.1*  PLT 99* 119*    Recent Labs  11/03/16 0456 11/04/16 0335  NA 138 140  K 3.5 4.2  CL 105 108  CO2 24 23  BUN 18 26*  CREATININE 0.65 0.96  GLUCOSE 109* 136*  CALCIUM 8.3* 8.5*    Recent Labs  11/03/16 0754 11/04/16 0335  INR 2.05 2.15    Physical Exam: ABD soft Sensation intact distally Incision: moderate drainage Compartment soft Improvement in pts RLE  Assessment/Plan: 3 Days Post-Op Procedure(s) (LRB): DECOMPRESSIVE POSTERIOR LUMBAR LAMINECTOMY LEVEL 4-5 (N/A) IRRIGATION AND DEBRIDEMENT ABSCESS RIGHT THIGH (Right) Continue treatment on the unit Continue to be monitored by Critical care team and Infectious Disease  Mayo, Darla Lesches for Dr. Melina Schools [facility] [phone] 11/04/2016, 1:09 PM

## 2016-11-04 NOTE — Evaluation (Signed)
Clinical/Bedside Swallow Evaluation Patient Details  Name: [name] MRN: [id number] Date of Birth: 05-29-1981  Today's Date: 11/04/2016 Time: SLP Start Time (ACUTE ONLY): [id number] SLP Stop Time (ACUTE ONLY): 1255 SLP Time Calculation (min) (ACUTE ONLY): 10 min  Past Medical History:  Past Medical History:  Diagnosis Date  . Hepatitis C    Past Surgical History:  Past Surgical History:  Procedure Laterality Date  . DECOMPRESSIVE LUMBAR LAMINECTOMY LEVEL 1 N/A 11/01/2016   Procedure: DECOMPRESSIVE POSTERIOR LUMBAR LAMINECTOMY LEVEL 4-5;  Surgeon: Melina Schools, MD;  Location: [place];  Service: Orthopedics;  Laterality: N/A;  . HERNIA REPAIR    . I&D EXTREMITY Right 11/01/2016   Procedure: IRRIGATION AND DEBRIDEMENT ABSCESS RIGHT THIGH;  Surgeon: Melina Schools, MD;  Location: [place];  Service: Orthopedics;  Laterality: Right;   HPI:  36 yo male developed swelling in Rt leg and foot, and back pain. 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 He was seen by orthopedics, and started on IV abx. 2/04 surgery for decompressive posterior lumbar laminectomy L4-5 and I&D of right thigh abscess He has hx of Hepatitis C and ETOH. He was noted to have jaundice and was seen by GI. He was started on prednisone for alcoholic hepatitis. He was also seen by ID. He developed progressive altered mental status due to delirium tremens. He was transferred to ICU and intubated 2/3; self extubated 2/7.     Assessment / Plan / Recommendation Clinical Impression  Pt presents with an acute post-extubation dysphagia marked by s/s of potential aspiration, exacerbated by decreased MS.  For today, recommend allowing ice chips, meds crushed in puree after oral care. Otherwise NPO.  SLP will f/u next date for improved readiness.     Aspiration Risk  Moderate aspiration risk    Diet Recommendation   npo excepts meds crushed in puree, occasional ice  chips after oral care  Medication Administration: Crushed with puree    Other  Recommendations Oral Care Recommendations: Oral care QID;Oral care prior to ice chip/H20   Follow up Recommendations  (tba)      Frequency and Duration min 2x/week  1 week       Prognosis Prognosis for Safe Diet Advancement: Good      Swallow Study   General Date of Onset: 10/29/16 HPI: 36 yo male developed swelling in Rt leg and foot, and back pain. 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 He was seen by orthopedics, and started on IV abx. 2/04 surgery for decompressive posterior lumbar laminectomy L4-5 and I&D of right thigh abscess He has hx of Hepatitis C and ETOH. He was noted to have jaundice and was seen by GI. He was started on prednisone for alcoholic hepatitis. He was also seen by ID. He developed progressive altered mental status due to delirium tremens. He was transferred to ICU and intubated 2/3; self extubated 2/7.   Type of Study: Bedside Swallow Evaluation Previous Swallow Assessment: no Diet Prior to this Study: NPO Temperature Spikes Noted: Yes Respiratory Status: Nasal cannula History of Recent Intubation: Yes Length of Intubations (days): 4 days Date extubated: 11/04/16 Behavior/Cognition: Confused Oral Cavity Assessment: Within Functional Limits Oral Care Completed by SLP: No Oral Cavity - Dentition: Adequate natural dentition Vision: Functional for self-feeding Self-Feeding Abilities: Needs assist Patient Positioning: Upright in bed Baseline Vocal Quality: Normal Volitional Cough: Weak Volitional Swallow:  Able to elicit    Oral/Motor/Sensory Function Overall Oral Motor/Sensory Function: Within functional limits   Ice Chips Ice chips: Within functional limits   Thin Liquid Thin Liquid: Impaired Presentation: Cup;Straw Pharyngeal  Phase Impairments: Wet Vocal Quality;Multiple swallows;Suspected  delayed Swallow;Throat Clearing - Delayed    Nectar Thick Nectar Thick Liquid: Not tested   Honey Thick Honey Thick Liquid: Not tested   Puree Puree: Impaired Presentation: Spoon Pharyngeal Phase Impairments: Multiple swallows   Solid   GO   Solid: Not tested        [name] 11/04/2016,1:45 PM

## 2016-11-04 NOTE — Progress Notes (Signed)
eLink Physician-Brief Progress Note Patient Name: [name] DOB: [birth year] MRN: [id number]   Date of Service  11/04/2016  HPI/Events of Note    eICU Interventions  Initiate dex infusion for severe agitation     Intervention Category Major Interventions: Delirium, psychosis, severe agitation - evaluation and management  Wilhelmina Mcardle 11/04/2016, 11:52 PM

## 2016-11-04 NOTE — Progress Notes (Signed)
PCCM Consult Note  Admission date: 10/29/2016 Consult date: 10/31/2016 Referring provider: Dr. Sherral Hammers  CC: Delirium  HPI: 36 yo male developed swelling in Rt leg and foot, and back pain.  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  He was seen by orthopedics, and started on IV abx.  He has hx of Hepatitis C and ETOH.  He was noted to have jaundice and was seen by GI.  He was started on prednisone for alcoholic hepatitis.  He was also seen by ID.  He developed progressive altered mental status due to delirium tremens.  He was transferred to ICU.  SUBJECTIVE : 1 BM noted yesterday  Febrile to 101.1 overnight Patient self-extubated today  Up 4.8 L +, continue diuresis   Vital signs: BP (!) 103/58   Pulse 72   Temp 99.9 F (37.7 C) (Oral)   Resp 20   Ht 5\' 11"  (1.803 m)   Wt 266 lb 1.6 oz (120.7 kg)   SpO2 99%   BMI 37.11 kg/m   Intake/output: I/O last 3 completed shifts: In: 5460.8 [I.V.:4290.8; NG/GT:320; IV Piggyback:850] Out: 4255 [Urine:4180; Drains:75]   Vent Mode: PRVC FiO2 (%):  [40 %] 40 % Set Rate:  [20 bmp] 20 bmp Vt Set:  [600 mL] 600 mL PEEP:  [5 cmH20] 5 cmH20 Pressure Support:  [12 cmH20] 12 cmH20 Plateau Pressure:  [14 cmH20-26 cmH20] 14 cmH20  General: Jaundice young man sedated, intubated.  Neuro: sedated. Intubated. CN grossly intact. (-) lateralizing signs.  Eyes: Scelra icterus  ENT: no stridor Cardiac: RRR, no m/r/g noted  Chest: no wheeze. Good ae. CTA.  Abd: soft, BS+, non-distended, non-tender  Ext: Rt leg warm, wrapped, Skin: Wrapped surgically debrided right leg   CMP Latest Ref Rng & Units 11/04/2016 11/03/2016 11/02/2016  Glucose 65 - 99 mg/dL 136(H) 109(H) 117(H)  BUN 6 - 20 mg/dL 26(H) 18 19  Creatinine 0.61 - 1.24 mg/dL 0.96 0.65 0.83  Sodium 135 - 145 mmol/L 140 138 137  Potassium 3.5 - 5.1 mmol/L 4.2 3.5 4.2  Chloride 101 - 111 mmol/L 108 105 107  CO2 22 - 32 mmol/L 23 24  24   Calcium 8.9 - 10.3 mg/dL 8.5(L) 8.3(L) 7.8(L)  Total Protein 6.5 - 8.1 g/dL 5.6(L) 6.4(L) 5.4(L)  Total Bilirubin 0.3 - 1.2 mg/dL 10.3(H) 10.1(H) 11.2(H)  Alkaline Phos 38 - 126 U/L 116 123 123  AST 15 - 41 U/L 163(H) 162(H) 175(H)  ALT 17 - 63 U/L 69(H) 73(H) 67(H)     CBC Latest Ref Rng & Units 11/04/2016 11/03/2016 11/02/2016  WBC 4.0 - 10.5 K/uL 16.1(H) 9.9 13.5(H)  Hemoglobin 13.0 - 17.0 g/dL 9.0(L) 9.3(L) 8.7(L)  Hematocrit 39.0 - 52.0 % 28.1(L) 28.2(L) 25.3(L)  Platelets 150 - 400 K/uL 119(L) 99(L) 107(L)    Lab Results  Component Value Date   INR 2.15 11/04/2016   INR 2.05 11/03/2016   INR 2.00 11/03/2016    ABG    Component Value Date/Time   PHART 7.409 11/01/2016 1212   PCO2ART 35.9 11/01/2016 1212   PO2ART 96.0 11/01/2016 1212   HCO3 23.3 11/01/2016 1212   TCO2 25 11/01/2016 1212   ACIDBASEDEF 2.0 11/01/2016 1212   O2SAT 98.0 11/01/2016 1212    CBG (last 3)   Recent Labs  11/03/16 1926 11/04/16 0041 11/04/16 0340  GLUCAP 125* 104* 122*     Imaging: Dg Chest Port 1 View  Result Date: 11/03/2016  CLINICAL DATA:  Repositioning of endotracheal tube. Tube was pulled earlier today. EXAM: PORTABLE CHEST 1 VIEW COMPARISON:  11/01/2016 FINDINGS: Tip of endotracheal tube is 3.3 cm above the carina in satisfactory position. Left IJ central line catheter is noted at the cavoatrial junction. No pneumothorax. Gastric tube extends below the left hemidiaphragm and appears to coil back towards the expected location of the gastric cardia. Low lung volumes with crowding of interstitial lung markings. Stable cardiomegaly retrocardiac consolidations are not entirely excluded. IMPRESSION: 1. Satisfactory support line and tube positions. 2. Stable cardiomegaly with mild interstitial edema. Cannot exclude trace right effusion nor retrocardiac pulmonary consolidation versus atelectasis. Electronically Signed   By: Ashley Royalty M.D.   On: 11/03/2016 14:59   Dg Abd Portable  1v  Result Date: 11/03/2016 CLINICAL DATA:  OG tube placement EXAM: PORTABLE ABDOMEN - 1 VIEW COMPARISON:  11/01/2016 FINDINGS: Esophageal tube is looped in the stomach, the tip and side port overlie the mid to distal stomach. Gas-filled slightly enlarged bowel loops in the upper abdomen. Improved aeration of left lung base. IMPRESSION: Esophageal tube tip projects over the mid to distal stomach Electronically Signed   By: Donavan Foil M.D.   On: 11/03/2016 23:35    Studies: Doppler 1/29>>> negative for thrombus  RUQ u/s 2/01 >> diffuse hepatic echogenicity MR Rt leg 2/02 >> abscess posterior compartment of thigh, along course of sciatic/femoral nerve, cellulitis below the knee  Antibiotics: Zosyn 2/01 >>2/1 Cefepime 2/02 >>2/05 Vancomycin 2/01 >> Ceftriaxone 2/05>>  Cultures: Blood 2/01 >>NGTD Right thigh Abscess  2/04>>rare gram-positive cocci in pairs  Back/Lumbar Abscess 2/04>>NGTD   Lines/tubes: ETT 2/3 >  TLC  2/3 >  Events: 2/01 Admit 2/03 Transfer to ICU, start propofol  2/04 Surgical debridement of lumbar and right thigh  2/06 Self extubated    Summary: 36 yo with L4-5 septic arthritis, paraspinal abscess, Rt thigh abscess, and Rt lower leg cellulitis.  He has hx of ETOH with acute alcoholic hepatitis, and hepatitis C.  He developed altered mental status from delirium tremens.  Assessment/plan:  INFECTIOUS  A: Sepsis 2/2 L4-5 septic arthritis, paraspinal abscess, Rt thigh abscess, Rt lower leg cellulitis. MRI Tib/Fib/Femur  positive for extensive abscesses involving the posterior compartment of the thigh along with myofasciitis and cellulitis.  On 2/04 surgery for decompressive posterior lumbar laminectomy L4-5 and I&D of right thigh abscess  Febrile overnight   P:  - ID and surgery following  - Continue vanc and ceftriaxone   - could consider getting a PCT and trend - if elevated broaden to anaerobic organisms   CARDIOVASCULAR A:  No Cardiac hx  P:  -  MAP > 60  - Not requiring pressors   PULMONARY A: Acute hypoxemic respiratory failure 2/2 unable to protect airway 2/2 DTs with a CIWA of 42  P:   - Self extubated this AM  - Follow respiratory status   RENAL A:  Stable SCr, edema noted P:  Daily BMETs  Lasix 40 IV daily  D/C IV fluids   GASTROINTESTINAL A:   Acute alcoholic hepatitis. Hx of Hepatitis C. Elevated LFTs and Total bilirubin, continues to trend down  INR stable  P:  GI following, no steroids  Continue Protonix  Lactulose q6h + rifaximin 400 mg QID  Start CIWA   HEMATOLOGIC A:   Coagulopathy, thrombocytopenia 2nd to liver disease. S/P Vit K and 4u FFP, albumin on 2/4.  Thrombocytopenia slightly downtrending   Hgb stable, improved  PT/PTT elevated, slight trending  up  INR stable, slightly elevated  P:  Continue Daily CBC and INR Transfuse as needed  DVT prophylaxis - SCDs  ENDOCRINE A:   Nutrition  P:   Consider diet later today as self extubated if no longer confused   NEUROLOGIC A:   Encephalopathy 2/2 Delirium tremens. Possible Drug abuse. Also related to hepatic encephalopathy.  Self extubated, alert and orientated x 2  P: - fentanyl pushes, continue Ativan 1 mg q8hr scheduled  - stop precedex   - CIWA protocol  - RASS goal 0 to -1 - thiamine, folic acid - cont lactulose QID and rifaximin - 1 BM today   STAFF NOTE: I, Merrie Roof, MD FACP have personally reviewed patient's available data, including medical history, events of note, physical examination and test results as part of my evaluation. I have discussed with resident/NP and other care providers such as pharmacist, RN and RRT. In addition, I personally evaluated patient and elicited key findings of: awake, follow commands, less coarse BS, drains out, abdo soft, mild increase rr just now self extubated, will assess abg, pcxr in am, had BM finally , maintain current lactulose, rifaximin, his etoh wd seems more controlled, dc  precedex, ativan scheduled to q8h, bili down slight, inr elevated fro m liver, repeat vit K , low threshold ffp for s/p epidural abscess drainage, steroids per Gi for etoh hepatitis concerns (unlikley to need), dc aline today likely, plat count is resolving, consider dc vanc, per ID The patient is critically ill with multiple organ systems failure and requires high complexity decision making for assessment and support, frequent evaluation and titration of therapies, application of advanced monitoring technologies and extensive interpretation of multiple databases.   Critical Care Time devoted to patient care services described in this note is 30 Minutes. This time reflects time of care of this signee: Merrie Roof, MD FACP. This critical care time does not reflect procedure time, or teaching time or supervisory time of PA/NP/Med student/Med Resident etc but could involve care discussion time. Rest per NP/medical resident whose note is outlined above and that I agree with   Lavon Paganini. Titus Mould, MD, [place] Pgr: [facility] 11/04/2016 10:14 AM

## 2016-11-04 NOTE — Progress Notes (Signed)
Orthopedics Progress Note  Subjective: Patient awake this morning and extubated  Objective:  Vitals:   11/04/16 0600 11/04/16 0728  BP: (!) 103/58   Pulse: 72   Resp:    Temp:  99.1 F (37.3 C)    General: Awake and follows commands Musculoskeletal: decreased overall leg swelling, dressing intact, no pain with active ankle ROM Neurovascularly intact  Lab Results  Component Value Date   WBC 16.1 (H) 11/04/2016   HGB 9.0 (L) 11/04/2016   HCT 28.1 (L) 11/04/2016   MCV 101.8 (H) 11/04/2016   PLT 119 (L) 11/04/2016       Component Value Date/Time   NA 140 11/04/2016 0335   K 4.2 11/04/2016 0335   CL 108 11/04/2016 0335   CO2 23 11/04/2016 0335   GLUCOSE 136 (H) 11/04/2016 0335   BUN 26 (H) 11/04/2016 0335   CREATININE 0.96 11/04/2016 0335   CALCIUM 8.5 (L) 11/04/2016 0335   GFRNONAA >60 11/04/2016 0335   GFRAA >60 11/04/2016 0335    Lab Results  Component Value Date   INR 2.15 11/04/2016   INR 2.05 11/03/2016   INR 2.00 11/03/2016    Assessment/Plan: POD #3 s/p Procedure(s): DECOMPRESSIVE POSTERIOR LUMBAR LAMINECTOMY LEVEL 4-5 IRRIGATION AND DEBRIDEMENT ABSCESS RIGHT THIGH Clinically improved, leg looks better, mobilization once cleared by medicine and Dr Sherral Hammers R. Veverly Fells, MD 11/04/2016 7:50 AM

## 2016-11-04 NOTE — Progress Notes (Signed)
   Restraint rnewwal done from elink at bedside RN request  Dr. Brand Males, M.D., [facility].C.P Pulmonary and Critical Care Medicine Staff Physician [place] [facility] Pager: [phone], If no answer or between  15:00h - 7:00h: call [phone]  11/04/2016 5:19 PM

## 2016-11-05 DIAGNOSIS — G9340 Encephalopathy, unspecified: Secondary | ICD-10-CM

## 2016-11-05 DIAGNOSIS — M869 Osteomyelitis, unspecified: Secondary | ICD-10-CM

## 2016-11-05 DIAGNOSIS — G8220 Paraplegia, unspecified: Secondary | ICD-10-CM

## 2016-11-05 LAB — HEPATIC FUNCTION PANEL
ALK PHOS: 115 U/L (ref 38–126)
ALT: 60 U/L (ref 17–63)
AST: 130 U/L — ABNORMAL HIGH (ref 15–41)
Albumin: 1.8 g/dL — ABNORMAL LOW (ref 3.5–5.0)
BILIRUBIN INDIRECT: 5.2 mg/dL — AB (ref 0.3–0.9)
BILIRUBIN TOTAL: 10.5 mg/dL — AB (ref 0.3–1.2)
Bilirubin, Direct: 5.3 mg/dL — ABNORMAL HIGH (ref 0.1–0.5)
Total Protein: 5.6 g/dL — ABNORMAL LOW (ref 6.5–8.1)

## 2016-11-05 LAB — BASIC METABOLIC PANEL
Anion gap: 8 (ref 5–15)
BUN: 12 mg/dL (ref 6–20)
CHLORIDE: 109 mmol/L (ref 101–111)
CO2: 24 mmol/L (ref 22–32)
CREATININE: 0.65 mg/dL (ref 0.61–1.24)
Calcium: 8.9 mg/dL (ref 8.9–10.3)
GFR calc Af Amer: >60 mL/min (ref >60)
GFR calc non Af Amer: >60 mL/min (ref >60)
Glucose, Bld: 116 mg/dL — ABNORMAL HIGH (ref 65–99)
Potassium: 3.4 mmol/L — ABNORMAL LOW (ref 3.5–5.1)
Sodium: 141 mmol/L (ref 135–145)

## 2016-11-05 LAB — CBC
HEMATOCRIT: 28 % — AB (ref 39.0–52.0)
HEMOGLOBIN: 9.1 g/dL — AB (ref 13.0–17.0)
MCH: 33.2 pg (ref 26.0–34.0)
MCHC: 32.5 g/dL (ref 30.0–36.0)
MCV: 102.2 fL — AB (ref 78.0–100.0)
Platelets: 127 10*3/uL — ABNORMAL LOW (ref 150–400)
RBC: 2.74 MIL/uL — ABNORMAL LOW (ref 4.22–5.81)
RDW: 18.6 % — ABNORMAL HIGH (ref 11.5–15.5)
WBC: 14.5 10*3/uL — ABNORMAL HIGH (ref 4.0–10.5)

## 2016-11-05 LAB — GLUCOSE, CAPILLARY
GLUCOSE-CAPILLARY: 139 mg/dL — AB (ref 65–99)
GLUCOSE-CAPILLARY: 100 mg/dL — AB (ref 65–99)
GLUCOSE-CAPILLARY: 98 mg/dL (ref 65–99)
Glucose-Capillary: 112 mg/dL — ABNORMAL HIGH (ref 65–99)
Glucose-Capillary: 129 mg/dL — ABNORMAL HIGH (ref 65–99)
Glucose-Capillary: 109 mg/dL — ABNORMAL HIGH (ref 65–99)

## 2016-11-05 LAB — PROTIME-INR
INR: 2
PROTHROMBIN TIME: 22.9 s — AB (ref 11.4–15.2)

## 2016-11-05 MED ORDER — LORAZEPAM 2 MG/ML IJ SOLN
0.5000 mg | INTRAMUSCULAR | Status: DC | PRN
  Administered 2016-11-05 – 2016-11-06 (×2): 1 mg via INTRAVENOUS
  Filled 2016-11-05 (×2): qty 1

## 2016-11-05 MED ORDER — SODIUM CHLORIDE 0.9 % IV SOLN
30.0000 meq | Freq: Once | INTRAVENOUS | Status: AC
  Administered 2016-11-05: 30 meq via INTRAVENOUS
  Filled 2016-11-05: qty 15

## 2016-11-05 MED ORDER — DEXMEDETOMIDINE HCL IN NACL 400 MCG/100ML IV SOLN
0.0000 ug/kg/h | INTRAVENOUS | Status: DC
  Administered 2016-11-05: 0.5 ug/kg/h via INTRAVENOUS
  Administered 2016-11-05 (×2): 2 ug/kg/h via INTRAVENOUS
  Filled 2016-11-05 (×3): qty 100

## 2016-11-05 MED ORDER — FUROSEMIDE 10 MG/ML IJ SOLN
40.0000 mg | Freq: Once | INTRAMUSCULAR | Status: AC
  Administered 2016-11-05: 40 mg via INTRAVENOUS
  Filled 2016-11-05: qty 4

## 2016-11-05 MED ORDER — RISPERIDONE 1 MG PO TABS
1.0000 mg | ORAL_TABLET | Freq: Two times a day (BID) | ORAL | Status: DC
  Administered 2016-11-05 – 2016-11-12 (×14): 1 mg via ORAL
  Filled 2016-11-05 (×15): qty 1

## 2016-11-05 MED ORDER — VITAMIN K1 10 MG/ML IJ SOLN
10.0000 mg | Freq: Once | INTRAMUSCULAR | Status: AC
  Administered 2016-11-05: 10 mg via INTRAVENOUS
  Filled 2016-11-05: qty 1

## 2016-11-05 MED ORDER — MIDAZOLAM HCL 2 MG/2ML IJ SOLN
2.0000 mg | INTRAMUSCULAR | Status: DC | PRN
  Administered 2016-11-07: 2 mg via INTRAVENOUS
  Filled 2016-11-05: qty 2

## 2016-11-05 MED ORDER — LORAZEPAM 2 MG/ML IJ SOLN
2.0000 mg | Freq: Three times a day (TID) | INTRAMUSCULAR | Status: DC
  Administered 2016-11-05 – 2016-11-07 (×6): 2 mg via INTRAVENOUS
  Filled 2016-11-05 (×6): qty 1

## 2016-11-05 NOTE — Progress Notes (Signed)
    Subjective: 4 Days Post-Op Procedure(s) (LRB): DECOMPRESSIVE POSTERIOR LUMBAR LAMINECTOMY LEVEL 4-5 (N/A) IRRIGATION AND DEBRIDEMENT ABSCESS RIGHT THIGH (Right) Pt rouses to touch.  Nurse reports his pain level is low.  They are keeping him sedated because he is restless.  Foley still in place Objective: Vital signs in last 24 hours: Temp:  [97.4 F (36.3 C)-99.6 F (37.6 C)] 99.5 F (37.5 C) (02/08 0832) Pulse Rate:  [71-117] 76 (02/08 1000) Resp:  [20-24] 20 (02/08 1000) BP: (122-176)/(60-92) 138/82 (02/08 1000) SpO2:  [90 %-99 %] 96 % (02/08 1000) Arterial Line BP: (151)/(71) 151/71 (02/07 1100)  Intake/Output from previous day: 02/07 0701 - 02/08 0700 In: 2695.4 [I.V.:2380.5; IV Piggyback:314.9] Out: 4325 [Urine:4325] Intake/Output this shift: Total I/O In: 295.4 [I.V.:295.4] Out: 1075 [Urine:1075]  Labs:  Recent Labs  11/03/16 0456 11/04/16 0335 11/05/16 0300  HGB 9.3* 9.0* 9.1*    Recent Labs  11/04/16 0335 11/05/16 0300  WBC 16.1* 14.5*  RBC 2.76* 2.74*  HCT 28.1* 28.0*  PLT 119* 127*    Recent Labs  11/04/16 0335 11/05/16 0300  NA 140 141  K 4.2 3.4*  CL 108 109  CO2 23 24  BUN 26* 12  CREATININE 0.96 0.65  GLUCOSE 136* 116*  CALCIUM 8.5* 8.9    Recent Labs  11/04/16 0335 11/05/16 0300  INR 2.15 2.00    Physical Exam: Incision: moderate drainage Compartment soft  Assessment/Plan: 4 Days Post-Op Procedure(s) (LRB): DECOMPRESSIVE POSTERIOR LUMBAR LAMINECTOMY LEVEL 4-5 (N/A) IRRIGATION AND DEBRIDEMENT ABSCESS RIGHT THIGH (Right) Continue care on unit  Mayo, Darla Lesches for Dr. Melina Schools [facility] [phone] 11/05/2016, 10:28 AM    Patient ID: [name], male   DOB: Jul 06, 1981, 36 y.o.   MRN: [id number]

## 2016-11-05 NOTE — Progress Notes (Signed)
      INFECTIOUS DISEASE ATTENDING ADDENDUM:   Date: 11/05/2016  Patient name: [name]  Medical record number: [id number]  Date of birth: Nov 28, 1980    This patient has been seen and discussed with the house staff. Please see the resident's note for complete details. I concur with their findings with the following additions/corrections:  Patient was encephalopathic today when I examined him. Still with profound icterus he has bandages in place from his 2 surgeries. His Streptococcus intermedius is sensitive to penicillin and ceftriaxone. The viridans group streptococci were not set up for susceptibility testing yet.  I would plan on treating him with 6 weeks of IV ceftriaxone but would prefer for this to be done in a completely supervised setting such as rehabilitation plus skilled nursing facility.  If he is not agreeable to the latter we will need to treat him with oral Augmentin to finish his course again our preferred to be using IV antibiotics given his spine infection in particular.  I will follow-up the susceptibilities of the viridans group streptococci that should be back over the weekend but otherwise will sign off for now with the following plan.  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

## 2016-11-05 NOTE — Progress Notes (Signed)
[facility] ADULT ICU REPLACEMENT PROTOCOL FOR AM LAB REPLACEMENT ONLY  The patient does apply for the [facility] Adult ICU Electrolyte Replacment Protocol based on the criteria listed below:   1. Is GFR >/= 40 ml/min? Yes.    Patient's GFR today is >60 2. Is urine output >/= 0.5 ml/kg/hr for the last 6 hours? Yes.   Patient's UOP is 0.9 ml/kg/hr 3. Is BUN < 60 mg/dL? Yes.    Patient's BUN today is 12 4. Abnormal electrolyte  K 3.4,  5. Ordered repletion with: per protocol 6. If a panic level lab has been reported, has the CCM MD in charge been notified? Yes.  .   Physician:  Chapman Fitch 11/05/2016 5:28 AM

## 2016-11-05 NOTE — NC FL2 (Signed)
[place] LEVEL OF CARE SCREENING TOOL     IDENTIFICATION  Patient Name: [name] Birthdate: Sep 29, 1980 Sex: male Admission Date (Current Location): 10/29/2016  [facility] and [place] Number:  Herbalist and Address:  [facility] [place]. [facility], [place] [address] [place], [place] [address]      Provider Number: [id number]  Attending Physician Name and Address:  Rush Landmark, MD  Relative Name and Phone Number:  Arhum Cayabyab (Mother) [phone]    Current Level of Care: Hospital Recommended Level of Care: [place] Prior Approval Number:    Date Approved/Denied:   PASRR Number: [id number]  Discharge Plan: [name]    Current Diagnoses: Patient Active Problem List   Diagnosis Date Noted  . 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 Delirium tremens ([place])   . Abnormal transaminases   . Alcoholic hepatitis without ascites   . ETOH abuse   . Abscess of right lower extremity   . Cellulitis and abscess of right lower extremity   . Hypernatremia   . Cellulitis of right lower extremity 10/30/2016  . EtOH dependence ([place]) 10/30/2016  . Thrombocytopenia ([place]) 10/30/2016  . Cirrhosis ([place])   . Paraspinal abscess ([place])   . Chronic hepatitis C without hepatic coma ([place])   . Hepatitis   . Epidural abscess 10/29/2016  . Liver failure ([place]) 10/29/2016  . Sepsis ([place]) 10/29/2016  . Hyponatremia 10/29/2016    Orientation RESPIRATION BLADDER Height & Weight     Self  O2 (2L Nasal Cannula) Continent, Indwelling catheter Weight:  (unable to obtain weight, bed stating pt weight as 118lbs) Height:  5\' 11"  (180.3 cm)  BEHAVIORAL SYMPTOMS/MOOD NEUROLOGICAL BOWEL NUTRITION STATUS   (NONE)  (NONE) Continent Diet (NPO; Plan to advance diet prior to discharge)  AMBULATORY STATUS COMMUNICATION OF NEEDS Skin   Extensive Assist Verbally Skin abrasions (Closed back incision- guaze, paper tape, silicone dressing: change daily.  Closed right incision- ABD, Compression Wrap, paper tape: change daily )                       Personal Care Assistance Level of Assistance  Bathing, Feeding, Dressing Bathing Assistance: Maximum assistance Feeding assistance: Limited assistance Dressing Assistance: Maximum assistance     Functional Limitations Info  Sight, Hearing, Speech Sight Info: Adequate Hearing Info: Adequate Speech Info: Adequate    SPECIAL CARE FACTORS FREQUENCY  PT (By licensed PT)     PT Frequency: min 4x/week              Contractures Contractures Info: Not present    Additional Factors Info  Code Status, Allergies, Psychotropic Code Status Info: FULL Allergies Info: No Known Allergies Psychotropic Info: Ativan          Current Medications (11/05/2016):  This is the current hospital active medication list Current Facility-Administered Medications  Medication Dose Route Frequency Provider Last Rate Last Dose  . 0.9 %  sodium chloride infusion   Intravenous Continuous [place], MD 75 mL/hr at 11/04/16 1900    . cefTRIAXone (ROCEPHIN) 2 g in dextrose 5 % 50 mL IVPB  2 g Intravenous Q24H Truman Hayward, MD   2 g at 11/04/16 1737  . chlorhexidine (PERIDEX) 0.12 % solution 15 mL  15 mL Mouth Rinse BID Raylene Miyamoto, MD   Stopped at 11/04/16 1445  . Chlorhexidine Gluconate Cloth 2 % PADS 6 each  6 each Topical  Daily [place], MD   6 each at 11/04/16 1100  . dexmedetomidine (PRECEDEX) 400 MCG/100ML (4 mcg/mL) infusion  0-2 mcg/kg/hr Intravenous Titrated Jose Shirl Harris, MD 15.1 mL/hr at 11/05/16 0817 0.5 mcg/kg/hr at 11/05/16 0817  . fentaNYL (SUBLIMAZE) bolus via infusion 50 mcg  50 mcg Intravenous Q1H PRN [place], MD      . folic acid (FOLVITE) tablet 1 mg  1 mg Oral Daily Assunta Found Stone, RPH      . labetalol (NORMODYNE,TRANDATE) injection 10 mg  10 mg Intravenous Q4H PRN Raylene Miyamoto, MD   10 mg at 11/04/16 1900  . lactulose  (CHRONULAC) 10 GM/15ML solution 30 g  30 g Oral Q6H Raylene Miyamoto, MD   30 g at 11/04/16 2112  . LORazepam (ATIVAN) injection 0.5-1 mg  0.5-1 mg Intravenous Q4H PRN Wilhelmina Mcardle, MD   1 mg at 11/05/16 0106  . LORazepam (ATIVAN) injection 1 mg  1 mg Intravenous Q8H Raylene Miyamoto, MD   1 mg at 11/05/16 0534  . MEDLINE mouth rinse  15 mL Mouth Rinse q12n4p Raylene Miyamoto, MD   15 mL at 11/04/16 1630  . multivitamin with minerals tablet 1 tablet  1 tablet Oral Daily Assunta Found Stone, RPH      . ondansetron [facility]) tablet 4 mg  4 mg Oral Q6H PRN [place], MD       Or  . ondansetron [facility]) injection 4 mg  4 mg Intravenous Q6H PRN [place], MD      . pantoprazole sodium (PROTONIX) 40 mg/20 mL oral suspension 40 mg  40 mg Oral Daily Ricka Burdock, RPH      . rifaximin (XIFAXAN) tablet 400 mg  400 mg Oral Q8H Verner Mould, MD   400 mg at 11/04/16 2112  . sodium chloride flush (NS) 0.9 % injection 10-40 mL  10-40 mL Intracatheter Q12H Jose Angelo Radford Pax, MD   10 mL at 11/04/16 1037  . sodium chloride flush (NS) 0.9 % injection 10-40 mL  10-40 mL Intracatheter PRN Jose Angelo A Corrie Dandy, MD      . thiamine (VITAMIN B-1) tablet 100 mg  100 mg Oral Daily Ricka Burdock, [facility]         Discharge Medications: Please see discharge summary for a list of discharge medications.  Relevant Imaging Results:  Relevant Lab Results:   Additional Information SS [id number]  Lind Covert, LCSW

## 2016-11-05 NOTE — Progress Notes (Signed)
Wasted 250 cc fentanyl 2,500 mcg/250cc concentration into sink, witnessed with nurse Elpidio Anis

## 2016-11-05 NOTE — Progress Notes (Signed)
PCCM Consult Note  Admission date: 10/29/2016 Consult date: 10/31/2016 Referring provider: Dr. Sherral Hammers  CC: Delirium  HPI: 36 yo male developed swelling in Rt leg and foot, and back pain.  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  He was seen by orthopedics, and started on IV abx.  He has hx of Hepatitis C and ETOH.  He was noted to have jaundice and was seen by GI.  He was started on prednisone for alcoholic hepatitis.  He was also seen by ID.  He developed progressive altered mental status due to delirium tremens.  He was transferred to ICU.  SUBJECTIVE : Precedex started overnight for delirium, psychosis, severe agitation   Vital signs: BP (!) 146/77   Pulse 96   Temp 98.4 F (36.9 C) (Oral)   Resp 17   Ht 5\' 11"  (1.803 m)   Wt 266 lb 1.6 oz (120.7 kg)   SpO2 96%   BMI 37.11 kg/m   Intake/output: I/O last 3 completed shifts: In: 4412.6 [I.V.:3856; NG/GT:130; IV Piggyback:426.6] Out: 5075 [Urine:5075]     General: Jaundice young man sedated, intubated.  Neuro: Sedated, following commands  Eyes: Scelra icterus  ENT: no stridor Cardiac: RRR, no m/r/g noted  Chest: no wheeze. Good ae. CTA.  Abd: soft, BS+, non-distended, non-tender  Ext: Rt leg warm, wrapped, Skin: Wrapped surgically debrided right leg   CMP Latest Ref Rng & Units 11/05/2016 11/04/2016 11/03/2016  Glucose 65 - 99 mg/dL 116(H) 136(H) 109(H)  BUN 6 - 20 mg/dL 12 26(H) 18  Creatinine 0.61 - 1.24 mg/dL 0.65 0.96 0.65  Sodium 135 - 145 mmol/L 141 140 138  Potassium 3.5 - 5.1 mmol/L 3.4(L) 4.2 3.5  Chloride 101 - 111 mmol/L 109 108 105  CO2 22 - 32 mmol/L 24 23 24   Calcium 8.9 - 10.3 mg/dL 8.9 8.5(L) 8.3(L)  Total Protein 6.5 - 8.1 g/dL 5.6(L) 5.6(L) 6.4(L)  Total Bilirubin 0.3 - 1.2 mg/dL 10.5(H) 10.3(H) 10.1(H)  Alkaline Phos 38 - 126 U/L 115 116 123  AST 15 - 41 U/L 130(H) 163(H) 162(H)  ALT 17 - 63 U/L 60 69(H) 73(H)     CBC Latest Ref  Rng & Units 11/05/2016 11/04/2016 11/03/2016  WBC 4.0 - 10.5 K/uL 14.5(H) 16.1(H) 9.9  Hemoglobin 13.0 - 17.0 g/dL 9.1(L) 9.0(L) 9.3(L)  Hematocrit 39.0 - 52.0 % 28.0(L) 28.1(L) 28.2(L)  Platelets 150 - 400 K/uL 127(L) 119(L) 99(L)    Lab Results  Component Value Date   INR 2.00 11/05/2016   INR 2.15 11/04/2016   INR 2.05 11/03/2016    ABG    Component Value Date/Time   PHART 7.430 11/04/2016 1028   PCO2ART 36.7 11/04/2016 1028   PO2ART 60.0 (L) 11/04/2016 1028   HCO3 24.0 11/04/2016 1028   TCO2 25 11/01/2016 1212   ACIDBASEDEF 2.0 11/01/2016 1212   O2SAT 89.6 11/04/2016 1028    CBG (last 3)   Recent Labs  11/04/16 1932 11/04/16 2317 11/05/16 0336  GLUCAP 115* 105* 112*     Imaging: Dg Chest Port 1 View  Result Date: 11/03/2016 CLINICAL DATA:  Repositioning of endotracheal tube. Tube was pulled earlier today. EXAM: PORTABLE CHEST 1 VIEW COMPARISON:  11/01/2016 FINDINGS: Tip of endotracheal tube is 3.3 cm above the carina in satisfactory position. Left IJ central line catheter is noted at the cavoatrial junction. No pneumothorax. Gastric tube extends below the left hemidiaphragm and appears to coil back towards  the expected location of the gastric cardia. Low lung volumes with crowding of interstitial lung markings. Stable cardiomegaly retrocardiac consolidations are not entirely excluded. IMPRESSION: 1. Satisfactory support line and tube positions. 2. Stable cardiomegaly with mild interstitial edema. Cannot exclude trace right effusion nor retrocardiac pulmonary consolidation versus atelectasis. Electronically Signed   By: Ashley Royalty M.D.   On: 11/03/2016 14:59   Dg Abd Portable 1v  Result Date: 11/03/2016 CLINICAL DATA:  OG tube placement EXAM: PORTABLE ABDOMEN - 1 VIEW COMPARISON:  11/01/2016 FINDINGS: Esophageal tube is looped in the stomach, the tip and side port overlie the mid to distal stomach. Gas-filled slightly enlarged bowel loops in the upper abdomen. Improved  aeration of left lung base. IMPRESSION: Esophageal tube tip projects over the mid to distal stomach Electronically Signed   By: Donavan Foil M.D.   On: 11/03/2016 23:35    Studies: Doppler 1/29>>> negative for thrombus  RUQ u/s 2/01 >> diffuse hepatic echogenicity MR Rt leg 2/02 >> abscess posterior compartment of thigh, along course of sciatic/femoral nerve, cellulitis below the knee  Antibiotics: Zosyn 2/01 >>2/1 Cefepime 2/02 >>2/05 Vancomycin 2/01 >>2/7 Ceftriaxone 2/05>>  Cultures: Blood 2/01 >>NGTD Right thigh Abscess  2/04>>FEW VIRIDANS STREPTOCOCCUS Back/Lumbar Abscess 2/04>>FEW STREPTOCOCCUS INTERMEDIUS and FEW VIRIDANS STREPTOCOCCUS   Lines/tubes: ETT 2/3 > 2/7 TLC  2/3 >  Events: 2/01 Admit 2/03 Transfer to ICU, start propofol  2/04 Surgical debridement of lumbar and right thigh  2/07 Self extubated    Summary: 36 yo with L4-5 septic arthritis, paraspinal abscess, Rt thigh abscess, and Rt lower leg cellulitis.  He has hx of ETOH with acute alcoholic hepatitis, and hepatitis C.  He developed altered mental status from delirium tremens.  Assessment/plan:  INFECTIOUS  A: Sepsis 2/2 L4-5 septic arthritis, paraspinal abscess, Rt thigh abscess, Rt lower leg cellulitis. MRI Tib/Fib/Femur  positive for extensive abscesses involving the posterior compartment of the thigh along with myofasciitis and cellulitis.  On 2/04 surgery for decompressive posterior lumbar laminectomy L4-5 and I&D of right thigh abscess   P:  - ID and surgery following  - Narrow to ceftriaxone due to blood cultures FEW STREPTOCOCCUS INTERMEDIUS and FEW VIRIDANS STREPTOCOCCUS -echo  CARDIOVASCULAR A:  No Cardiac hx  P:  - MAP > 60  - Not requiring pressors  -lasix  PULMONARY A: Acute hypoxemic respiratory failure 2/2 unable to protect airway 2/2 DTs with a CIWA of 42  P:   - Maintaining airway   RENAL A:  Stable SCr, edema noted P:  Daily BMETs  Lasix 40 IV daily  Negative  fluid balance  k supp  GASTROINTESTINAL A:   Acute alcoholic hepatitis. Hx of Hepatitis C. Elevated LFTs and Total bilirubin stable  INR stable  BM x 1 yesterday but was a monster BM P:  GI following, no steroids  Continue Protonix  Lactulose q6h + rifaximin 400 mg QID    HEMATOLOGIC A:   Coagulopathy, thrombocytopenia 2nd to liver disease. S/P Vit K and 4u FFP, albumin on 2/4.  Thrombocytopenia present, improved  Hgb stable PT/PTT elevated, slight trending up  INR stable, slightly trend down  P:  S/p vitamin K, INR still 2.0  Could give another dose of vitamin K  Continue CBC and INR DVT prophylaxis - SCDs  ENDOCRINE A:   Nutrition  P:   Currently NPO, consider diet today   NEUROLOGIC A:   Encephalopathy 2/2 Delirium tremens. Possible Drug abuse. Also related to hepatic encephalopathy.  Self extubated 2/7  Agitated overnight, restarted precedex overnight   P: - Continue precedex 0.5 - fentanyl pushes, continue Ativan 1 mg q8hr scheduled , 0.5- 1 mg of Ativan q4hr prn  - thiamine, folic acid - cont lactulose QID and rifaximin   STAFF NOTE: I, Merrie Roof, MD FACP have personally reviewed patient's available data, including medical history, events of note, physical examination and test results as part of my evaluation. I have discussed with resident/NP and other care providers such as pharmacist, RN and RRT. In addition, I personally evaluated patient and elicited key findings of: agitated, hallucinations, lungs less coarse, back dry, strep verr noted on wound cultures, needs echo to assess veg but likely course abx for back will cover, lasix did well with this approach maintain, k supp, kvo, add oral rispirdal 1 q12h, ativan scheduled to 2 q8h, add prn versed, may need prn haldol, goal is to dc rispirdal, LFt about unchanged , continued thimaine, folic acid, repeat vit K some reduction INR with back wound The patient is critically ill with multiple organ systems  failure and requires high complexity decision making for assessment and support, frequent evaluation and titration of therapies, application of advanced monitoring technologies and extensive interpretation of multiple databases.   Critical Care Time devoted to patient care services described in this note is 30 Minutes. This time reflects time of care of this signee: Merrie Roof, MD FACP. This critical care time does not reflect procedure time, or teaching time or supervisory time of PA/NP/Med student/Med Resident etc but could involve care discussion time. Rest per NP/medical resident whose note is outlined above and that I agree with   Lavon Paganini. Titus Mould, MD, [place] Pgr: [place] [facility] 11/05/2016 10:42 AM

## 2016-11-05 NOTE — Clinical Social Work Note (Addendum)
Clinical Social Work Assessment  Patient Details  Name: [name] MRN: [id number] Date of Birth: 05-17-1981  Date of referral:  11/05/16               Reason for consult:  Facility Placement                Permission sought to share information with:  Family Supports, Customer service manager Permission granted to share information::  Yes, Verbal Permission Granted  Name::     Irah Bonenfant (Mother) [phone]; Lockie Mola (Significant Other) [phone]  Agency::     Relationship::     Contact Information:     Housing/Transportation Living arrangements for the past 2 months:  Apartment Source of Information:  Parent, Other (Comment Required) (Significant Other) Patient Interpreter Needed:  None Criminal Activity/Legal Involvement Pertinent to Current Situation/Hospitalization:  No - Comment as needed Significant Relationships:  Parents, Friend, Other Family Members, Significant Other, Siblings Lives with:  Significant Other Do you feel safe going back to the place where you live?  Yes Need for family participation in patient care:  Yes (Comment)  Care giving concerns:  None identified   Facilities manager / plan: Patient is a 36 YO male with hx of Hepatitis C and ETOH abuse. CSW engaged with via Patient's mother and brother via T/C. Patient's significant other currently has the flu. CSW introduced self, role of CSW, and discussed physical therapy's recommendation of [name] at discharge. CSW explained the process of [name] back up for [name] in the event that [name] is unable to accept Patient for admission. Patient's family agreeable. CSW to facilitate referrals to [name] as [name] backup.   CSW also discussed substance abuse concerns. Patient and family admits to Patient's ETOH abuse "occasionally- not every day". Patient had previously self disclosed that he will drink anywhere from a pint to 750 mL of vodka on days when he does drink. Patient also disclosed that he  previously used drugs via snorting and injection about 15-20 years ago with resultant Hepatitis C, however, he has been abstaining from illicit substances since then. Per medical record, no track marks on his upper extremities or legs. CSW discussed the negative side effects alcohol is having on Patient's physical health including alcoholic hepatitis, alcohol withdrawal with delirium, psychosis, severe agitation, and altered mental status due to delirium tremens. CSW provided Patient with substance abuse resources including outpatient, intensive outpatient,and residential treatment options.   Employment status:  Full-Time Patient works for a [employer] information:  Managed Care PT Recommendations:  Inpatient [place] / Referral to community resources:  [place], SBIRT  Patient/Family's Response to care: Patient's family appreciative of care received at this time. No concerns identified.   Patient/Family's Understanding of and Emotional Response to Diagnosis, Current Treatment, and Prognosis:  Patient's family verbalizes strong understanding of diagnosis, current treatment and prognosis. Family agreeable to [name] backup at this time. Patient's family did request Patient's girlfriend be educated on substance abuse and the negative effects on Patient's health. HIPAA compliant voice message left for girlfriend requesting return phone call.    Emotional Assessment Appearance:  Appears stated age Attitude/Demeanor/Rapport:  Unable to Assess Affect (typically observed):  Unable to Assess Orientation:  Oriented to Self Alcohol / Substance use:  Alcohol Use Psych involvement (Current and /or in the community):  No (Comment)  Discharge Needs  Concerns to be addressed:  Discharge Planning Concerns, Care Coordination Readmission within the last 30 days:  No Current discharge risk:  None Barriers to Discharge:  Continued Medical Work up   [employer],  LCSW 11/05/2016, 9:35 AM

## 2016-11-05 NOTE — Consult Note (Addendum)
WOC consult requested for back and leg abscess.  EMR indicates ortho team performed surgery and is following for post-op wound assessment and plan of care.  Please refer to their team for further questions. Please re-consult if further assistance is needed.  Thank-you,  Julien Girt MSN, [place], [place], [place], [place]

## 2016-11-05 NOTE — Consult Note (Signed)
Physical Medicine and Rehabilitation Consult   Reason for Consult: Epidural and right thigh abscess complicated by delirium tremens.   Referring Physician: Dr. Titus Mould    HPI: [name] is a 36 y.o. male with history of Hep C, aETOH abuse with cirrhosis of liver, IV drug abuse--clean for 15 years, chronic back pain, RLE cellulitis, who had recent MRI showing evidence of L4-5 right facet septic arthritis with soft tissue abscess paraspinal musculature 1.3 cm X 1.7 cm C 4.6 cm and  L2/3 and L3/4 disc protrusions.  He was admitted via MD office on 02/1 and started on IV antibiotics. Dr. Tommy Medal expressed concerns regarding right thigh myositis and MRI RLE revealed extensive abscesses involving posterior compartment of thigh along with myofasciiitis and cellulitis and abscess all along the course of sciatic/femoral nerve.  Surgery delayed due to abnormal LFTs, INR 2.1 and thrombocytopenia.  Dr. Fuller Plan consulted for input and recommended prednisone X 30 days for alcoholic hepatitis with jaundice. He developed mental status changes due to delirium tremens requiring intubation and sedation. He was taken to OR on 02/ 04/18 for I and D of right posterior thigh abscess by Dr. Veverly Fells and I and D of L4/5 epidural abscess with Gill decompression R L4/5 by Dr Rolena Infante. Patient self extubated yesterday and has required resumption of precedex due to psychosis and agitation. Therapy evaluations done yesterday revealing anxiety, confusion, hallucinations and generalized weakness affecting mobility and self care tasks. [name] recommended for follow up therapy.    ROS  Unable to obtain secondary to confusion  Past Medical History:  Diagnosis Date  . Hepatitis C     Past Surgical History:  Procedure Laterality Date  . DECOMPRESSIVE LUMBAR LAMINECTOMY LEVEL 1 N/A 11/01/2016   Procedure: DECOMPRESSIVE POSTERIOR LUMBAR LAMINECTOMY LEVEL 4-5;  Surgeon: Melina Schools, MD;  Location: [facility];  Service:  Orthopedics;  Laterality: N/A;  . HERNIA REPAIR    . I&D EXTREMITY Right 11/01/2016   Procedure: IRRIGATION AND DEBRIDEMENT ABSCESS RIGHT THIGH;  Surgeon: Melina Schools, MD;  Location: [facility];  Service: Orthopedics;  Laterality: Right;    History reviewed. No pertinent family history.    Social History:  Single- lives with girlfriend? Works for a [employer]? Per  reports that he has been smoking.  He has never used smokeless tobacco.  Per reports that he drinks alcohol--a pint occasionally. Per  reports that he does not use drugs.    Allergies: No Known Allergies    Medications Prior to Admission  Medication Sig Dispense Refill  . cyclobenzaprine (FLEXERIL) 10 MG tablet Take 1 tablet (10 mg total) by mouth 2 (two) times daily as needed for muscle spasms. 20 tablet 0  . HYDROcodone-acetaminophen (NORCO/VICODIN) 5-325 MG tablet Take 2 tablets by mouth every 4 (four) hours as needed. 10 tablet 0  . naproxen (NAPROSYN) 500 MG tablet Take 1 tablet (500 mg total) by mouth 2 (two) times daily. 30 tablet 0  . promethazine (PHENERGAN) 25 MG tablet Take 25 mg by mouth every 6 (six) hours as needed for nausea or vomiting.    . diclofenac sodium (VOLTAREN) 1 % GEL Apply 4 g topically 4 (four) times daily. (Patient not taking: Reported on 10/29/2016) 100 g 1  . predniSONE (DELTASONE) 20 MG tablet Take 2 tablets (40 mg total) by mouth daily. 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[employer] Functional Status:  Mobility: Bed Mobility Overal bed mobility: Needs Assistance Bed Mobility: Sit to Sidelying Rolling: Mod assist, Min assist Sidelying to sit: Mod assist Sit to sidelying: Mod assist, +2 for  physical assistance, +2 for safety/equipment General bed mobility comments: max directional v/c's for sequencing, modA for trunk management and LE management back into bed Transfers Overall transfer level: Needs assistance Equipment used: 2 person hand held assist Transfers: Sit to/from Stand, Stand Pivot Transfers Sit to Stand: Mod assist, Max assist, +2 physical assistance, +2 safety/equipment Stand pivot transfers: +2 physical assistance, +2 safety/equipment, Max assist General transfer comment: pt with increased anxiety, reaching for everything and increased difficulty following commands. Pt required 2 attempts to return to bed Ambulation/Gait General Gait Details: no safe    ADL: ADL Overall ADL's : Needs assistance/impaired Grooming: Minimal assistance, Sitting, Wash/dry face Grooming Details (indicate cue type and reason): Min assist for sitting balance Upper Body Bathing: Moderate assistance, Sitting Lower Body Bathing: Maximal assistance, +2 for physical assistance, Sit to/from stand Upper Body Dressing : Moderate assistance, Sitting Lower Body Dressing: Maximal assistance, +2 for physical assistance, Sit to/from stand Lower Body Dressing Details (indicate cue type and reason): Max assist to don socks in sitting Toilet Transfer: Maximal assistance, +2 for physical assistance, Stand-pivot, BSC Toilet Transfer Details (indicate cue type and reason): Simulated by stand pivot to chair Functional mobility during ADLs: Maximal assistance, +2 for physical assistance, Cueing for safety, Cueing for sequencing (for stand pivot only)  Cognition: Cognition Overall Cognitive Status: Impaired/Different from baseline Orientation Level: Oriented to person, Oriented to time, Disoriented to place, Disoriented to situation Cognition Arousal/Alertness: Awake/alert Behavior During Therapy: Restless, Impulsive Overall Cognitive Status: Impaired/Different from baseline Area of Impairment:  Orientation, Attention, Memory, Following commands, Safety/judgement, Awareness, Problem solving Orientation Level: Disoriented to, Time Current Attention Level: Sustained Memory: Decreased recall of precautions Following Commands: Follows one step commands with increased time, Follows one step commands consistently Safety/Judgement: Decreased awareness of safety, Decreased awareness of deficits Awareness: Intellectual Problem Solving: Slow processing, Requires verbal cues, Difficulty sequencing, Requires tactile cues General Comments: pt with periods of hallucinations ie. wrap the stuff up in the safe, someones trying to choke me but pt easily re-directed   Blood pressure (!) 142/81, pulse 73, temperature 100.3 F (37.9 C), temperature source Oral, resp. rate (!) 21, height 5\' 11"  (1.803 m), weight 120.7 kg (266 lb 1.6 oz), SpO2 94 %. Physical Exam  Nursing note and vitals reviewed. Constitutional: He appears well-developed and well-nourished. He appears lethargic. He has a sickly appearance. No distress. He is restrained. Nasal cannula in place.  Obese male in four points restraints. Jaundiced appearing. Opened eyes minimally--restless and confused.   HENT:  Head: Normocephalic and atraumatic.  Eyes: EOM are normal. Pupils are equal, round, and reactive to light. Scleral icterus is present.  Neck: Normal range of motion. Neck supple.  Cardiovascular: Normal rate and regular rhythm.   Respiratory: Effort normal. He has decreased breath sounds. He has no wheezes.  GI: Soft. Bowel sounds are normal. He exhibits no distension. There is no tenderness.  Genitourinary:  Genitourinary Comments: Foley in place  Musculoskeletal:  Right thigh incision intact with sutures in place and moderate amount of bloody drainage on ABD pad.   Neurological: He appears lethargic.  Sedated--arouses easily but unable to stay awake, Language of confusion. Oriented to self  only. Moves all four.   Skin: Skin is  warm and dry. He is not diaphoretic.  , Oriented to hospital but not to time. Motor strength Limited due to mental status. Not attending very well. He has normal and equal grip strength bilaterally. Right lower extremity strength is 3 minus, hip flexors, knee extension, ankle dorsiflexion, left lower extremity 4 minus, hip flexion, extension, ankle dorsi flexion. Pain inhibition on the right side, but overall level of alertness impacts ability to participate with exam Sensation difficult to formally assess. However, he does withdraw slightly and grimaces to pinch on the toes bilaterally  Results for orders placed or performed during the hospital encounter of 10/29/16 (from the past 24 hour(s))  Glucose, capillary     Status: Abnormal   Collection Time: 11/04/16  4:15 PM  Result Value Ref Range   Glucose-Capillary 105 (H) 65 - 99 mg/dL   Comment 1 Document in Chart   Glucose, capillary     Status: Abnormal   Collection Time: 11/04/16  7:32 PM  Result Value Ref Range   Glucose-Capillary 115 (H) 65 - 99 mg/dL   Comment 1 Notify RN    Comment 2 Document in Chart   Glucose, capillary     Status: Abnormal   Collection Time: 11/04/16 11:17 PM  Result Value Ref Range   Glucose-Capillary 105 (H) 65 - 99 mg/dL   Comment 1 Notify RN    Comment 2 Document in Chart   CBC     Status: Abnormal   Collection Time: 11/05/16  3:00 AM  Result Value Ref Range   WBC 14.5 (H) 4.0 - 10.5 K/uL   RBC 2.74 (L) 4.22 - 5.81 MIL/uL   Hemoglobin 9.1 (L) 13.0 - 17.0 g/dL   HCT 28.0 (L) 39.0 - 52.0 %   MCV 102.2 (H) 78.0 - 100.0 fL   MCH 33.2 26.0 - 34.0 pg   MCHC 32.5 30.0 - 36.0 g/dL   RDW 18.6 (H) 11.5 - 15.5 %   Platelets 127 (L) 150 - 400 K/uL  Basic metabolic panel     Status: Abnormal   Collection Time: 11/05/16  3:00 AM  Result Value Ref Range   Sodium 141 135 - 145 mmol/L   Potassium 3.4 (L) 3.5 - 5.1 mmol/L   Chloride 109 101 - 111 mmol/L   CO2 24 22 - 32 mmol/L   Glucose, Bld 116 (H) 65 - 99  mg/dL   BUN 12 6 - 20 mg/dL   Creatinine, Ser 0.65 0.61 - 1.24 mg/dL   Calcium 8.9 8.9 - 10.3 mg/dL   GFR calc non Af Amer >60 >60 mL/min   GFR calc Af Amer >60 >60 mL/min   Anion gap 8 5 - 15  Hepatic function panel     Status: Abnormal   Collection Time: 11/05/16  3:00 AM  Result Value Ref Range   Total Protein 5.6 (L) 6.5 - 8.1 g/dL   Albumin 1.8 (L) 3.5 - 5.0 g/dL   AST 130 (H) 15 - 41 U/L   ALT 60 17 - 63 U/L   Alkaline Phosphatase 115 38 - 126 U/L   Total Bilirubin 10.5 (H) 0.3 - 1.2 mg/dL   Bilirubin, Direct 5.3 (H) 0.1 - 0.5 mg/dL   Indirect Bilirubin 5.2 (H) 0.3 - 0.9 mg/dL  Protime-INR     Status: Abnormal   Collection Time: 11/05/16  3:00 AM  Result Value Ref Range   Prothrombin Time 22.9 (H) 11.4 - 15.2 seconds  INR 2.00   Glucose, capillary     Status: Abnormal   Collection Time: 11/05/16  3:36 AM  Result Value Ref Range   Glucose-Capillary 112 (H) 65 - 99 mg/dL   Comment 1 Notify RN    Comment 2 Document in Chart   Glucose, capillary     Status: Abnormal   Collection Time: 11/05/16  8:28 AM  Result Value Ref Range   Glucose-Capillary 129 (H) 65 - 99 mg/dL   Comment 1 Notify RN    Comment 2 Document in Chart   Glucose, capillary     Status: Abnormal   Collection Time: 11/05/16 12:24 PM  Result Value Ref Range   Glucose-Capillary 139 (H) 65 - 99 mg/dL   Comment 1 Notify RN    Comment 2 Document in Chart    Dg Chest Port 1 View  Result Date: 11/03/2016 CLINICAL DATA:  Repositioning of endotracheal tube. Tube was pulled earlier today. EXAM: PORTABLE CHEST 1 VIEW COMPARISON:  11/01/2016 FINDINGS: Tip of endotracheal tube is 3.3 cm above the carina in satisfactory position. Left IJ central line catheter is noted at the cavoatrial junction. No pneumothorax. Gastric tube extends below the left hemidiaphragm and appears to coil back towards the expected location of the gastric cardia. Low lung volumes with crowding of interstitial lung markings. Stable cardiomegaly  retrocardiac consolidations are not entirely excluded. IMPRESSION: 1. Satisfactory support line and tube positions. 2. Stable cardiomegaly with mild interstitial edema. Cannot exclude trace right effusion nor retrocardiac pulmonary consolidation versus atelectasis. Electronically Signed   By: Ashley Royalty M.D.   On: 11/03/2016 14:59   Dg Abd Portable 1v  Result Date: 11/03/2016 CLINICAL DATA:  OG tube placement EXAM: PORTABLE ABDOMEN - 1 VIEW COMPARISON:  11/01/2016 FINDINGS: Esophageal tube is looped in the stomach, the tip and side port overlie the mid to distal stomach. Gas-filled slightly enlarged bowel loops in the upper abdomen. Improved aeration of left lung base. IMPRESSION: Esophageal tube tip projects over the mid to distal stomach Electronically Signed   By: Donavan Foil M.D.   On: 11/03/2016 23:35    Assessment/Plan: Diagnosis: Paraparesis, right greater than left, secondary to epidural abscess 1. Does the need for close, 24 hr/day medical supervision in concert with the patient's rehab needs make it unreasonable for this patient to be served in a less intensive setting? Yes 2. Co-Morbidities requiring supervision/potential complications: Polysubstance abuse, alcoholic hepatitis, right thigh myositis, bacteremia 3. Due to bladder management, bowel management, safety, skin/wound care, disease management, medication administration, pain management and patient education, does the patient require 24 hr/day rehab nursing? Yes 4. Does the patient require coordinated care of a physician, rehab nurse, PT (1-2 hrs/day, 5 days/week), OT (1-2 hrs/day, 5 days/week) and SLP (.5-1 hrs/day, 5 days/week) to address physical and functional deficits in the context of the above medical diagnosis(es)? Yes Addressing deficits in the following areas: balance, endurance, locomotion, strength, transferring, bowel/bladder control, bathing, dressing, feeding, grooming, toileting, cognition, speech, language,  swallowing and psychosocial support 5. Can the patient actively participate in an intensive therapy program of at least 3 hrs of therapy per day at least 5 days per week? Yes 6. The potential for patient to make measurable gains while on inpatient rehab is good 7. Anticipated functional outcomes upon discharge from inpatient rehab are modified independent  with PT, supervision with OT, supervision with SLP. 8. Estimated rehab length of stay to reach the above functional goals is: 14-18d 9. Does the patient have adequate social  supports and living environment to accommodate these discharge functional goals? Potentially 10. Anticipated D/C setting: Home 11. Anticipated post D/C treatments: [place] therapy 12. Overall Rehab/Functional Prognosis: good  RECOMMENDATIONS: This patient's condition is appropriate for continued rehabilitative care in the following setting: [name] Patient has agreed to participate in recommended program. N/A Note that insurance prior authorization may be required for reimbursement for recommended care.  Comment: Mental status, still limiting rehabilitation participation, we will follow PT, OT participation in acute care   Bary Leriche, PA-C 11/05/2016

## 2016-11-05 NOTE — Care Management Note (Addendum)
Case Management Note  Patient Details  Name: [name] MRN: [id number] Date of Birth: 1981-05-04  Subjective/Objective:   Pt admitted with altered mental status - positive cultures and cellulitis                 Action/Plan:  Pt is an active IVDU (incorrect statement see below)and ETOH.  Pt will likely need long term IV antibiotics - CSW consulted for tentative placement.  CM will continue to follow for discharge needs   Expected Discharge Date:   (unknown)               Expected Discharge Plan:  [place]  In-House Referral:  Clinical Social Work  Discharge planning Services  CM Consult  Post Acute Care Choice:    Choice offered to:     DME Arranged:    DME Agency:     HH Arranged:    [place] Agency:     Status of Service:  In process, will continue to follow  If discussed at Long Length of Stay Meetings, dates discussed:    Additional Comments: 11/05/2016  Bedside nurse confirmed that pt does not have IVDU evidence on body.  [name] informed CM that if pt is determined at any point to be a current IVDU [name]/[name] post [name] will likely not be covered by insurance.  CM also discussed with Physician Advisor - physician advisor feels that with a hx of IVDU pt is appropriate for [name].  CM informed [name] and new inpt rehab consult ordered 11/05/16   CM informed that pt is not an active IVDU as previously communicated- however pt does have a history of IVDU.  CM contacted [name] to inform of history and not current IV drug use for reconsideration of placement. Maryclare Labrador, RN 11/05/2016, 8:49 AM

## 2016-11-05 NOTE — Progress Notes (Signed)
Speech Language Pathology Treatment: Dysphagia  Patient Details Name: [name] MRN: [id number] DOB: 16-Oct-1980 Today's Date: 11/05/2016 Time: [id number] SLP Time Calculation (min) (ACUTE ONLY): 14 min  Assessment / Plan / Recommendation Clinical Impression  Patient seen for diagnostic swallow treatment with focus on readiness for additional pos. Patient current consuming minimal amounts of ice chips and meds crushed in puree per RN. Despite max verbal, tactile, and physical cues during today's session, patient unable to maintain alert state. Thermal stimulation to lips did not elicit oral acceptance of bolus. Meds discussed with RN. Patient has one liquid medication that RN mixed with thin orange juice this am. Inquired about potential to mix with pureed solid instead to facilitate safety of swallow based on initial bedside swallow evaluation results 2/7 however it would take large volumes of pureed solid to maintain thick consistency with liquid med. Per RN, no overt indication when consumed with small amount of thin liquid. Advised to ensure that patient is alert prior to giving and to monitor closely for s/s of aspiration, although risk for silent aspiration high based on 4 day intubation. Will continue to f/u.   HPI HPI: 36 yo male developed swelling in Rt leg and foot, and back pain. 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 He was seen by orthopedics, and started on IV abx. 2/04 surgery for decompressive posterior lumbar laminectomy L4-5 and I&D of right thigh abscess He has hx of Hepatitis C and ETOH. He was noted to have jaundice and was seen by GI. He was started on prednisone for alcoholic hepatitis. He was also seen by ID. He developed progressive altered mental status due to delirium tremens. He was transferred to ICU and intubated 2/3; self extubated 2/7.        SLP Plan  Continue with current plan of care      Recommendations  Diet recommendations: NPO Medication Administration: Crushed with puree                Oral Care Recommendations: Oral care QID;Oral care prior to ice chip/H20 Plan: Continue with current plan of care       [place], [place] [phone]    [place] 11/05/2016, 11:06 AM

## 2016-11-05 NOTE — Progress Notes (Signed)
Subjective: This morning, he was unable to answer all of my questions of orientation. Precedex resumed overnight for agitation.   Antibiotics:  Anti-infectives    Start     Dose/Rate Route Frequency Ordered Stop   11/03/16 1200  rifaximin (XIFAXAN) tablet 400 mg     400 mg Oral Every 8 hours 11/03/16 1109     11/03/16 1115  rifaximin (XIFAXAN) tablet 400 mg  Status:  Discontinued     400 mg Oral 4 times daily 11/03/16 1103 11/03/16 1109   11/02/16 2200  ceFEPIme (MAXIPIME) 2 g in dextrose 5 % 50 mL IVPB  Status:  Discontinued     2 g 100 mL/hr over 30 Minutes Intravenous Every 8 hours 11/02/16 1452 11/02/16 1756   11/02/16 1800  cefTRIAXone (ROCEPHIN) 2 g in dextrose 5 % 50 mL IVPB     2 g 100 mL/hr over 30 Minutes Intravenous Every 24 hours 11/02/16 1756     11/01/16 2200  ceFEPIme (MAXIPIME) 2 g in dextrose 5 % 50 mL IVPB  Status:  Discontinued     2 g 100 mL/hr over 30 Minutes Intravenous Every 12 hours 11/01/16 1520 11/02/16 1452   11/01/16 1800  vancomycin (VANCOCIN) IVPB 1000 mg/200 mL premix  Status:  Discontinued     1,000 mg 200 mL/hr over 60 Minutes Intravenous Every 8 hours 11/01/16 1520 11/04/16 1217   11/01/16 1346  vancomycin (VANCOCIN) powder  Status:  Discontinued       As needed 11/01/16 1346 11/01/16 1347   10/30/16 1000  ceFEPIme (MAXIPIME) 2 g in dextrose 5 % 50 mL IVPB  Status:  Discontinued     2 g 100 mL/hr over 30 Minutes Intravenous Every 12 hours 10/30/16 0939 11/01/16 1515   10/29/16 2200  vancomycin (VANCOCIN) IVPB 1000 mg/200 mL premix  Status:  Discontinued     1,000 mg 200 mL/hr over 60 Minutes Intravenous Every 8 hours 10/29/16 1412 11/01/16 1515   10/29/16 2200  piperacillin-tazobactam (ZOSYN) IVPB 3.375 g  Status:  Discontinued     3.375 g 12.5 mL/hr over 240 Minutes Intravenous Every 8 hours 10/29/16 1412 10/29/16 1430   10/29/16 1300  vancomycin (VANCOCIN) IVPB 1000 mg/200 mL premix     1,000 mg 200 mL/hr over 60 Minutes Intravenous  Once  10/29/16 1237 10/29/16 1519   10/29/16 1300  piperacillin-tazobactam (ZOSYN) IVPB 3.375 g     3.375 g 100 mL/hr over 30 Minutes Intravenous  Once 10/29/16 1237 10/29/16 1340      Medications: Scheduled Meds: . cefTRIAXone (ROCEPHIN)  IV  2 g Intravenous Q24H  . chlorhexidine  15 mL Mouth Rinse BID  . Chlorhexidine Gluconate Cloth  6 each Topical Daily  . folic acid  1 mg Oral Daily  . lactulose  30 g Oral Q6H  . LORazepam  2 mg Intravenous Q8H  . mouth rinse  15 mL Mouth Rinse q12n4p  . multivitamin with minerals  1 tablet Oral Daily  . pantoprazole sodium  40 mg Oral Daily  . rifaximin  400 mg Oral Q8H  . risperiDONE  1 mg Oral Q12H  . sodium chloride flush  10-40 mL Intracatheter Q12H  . thiamine  100 mg Oral Daily   Continuous Infusions: . sodium chloride 75 mL/hr at 11/04/16 1900  . dexmedetomidine Stopped (11/05/16 1052)   PRN Meds:.fentaNYL, labetalol, LORazepam, midazolam, ondansetron **OR** ondansetron (ZOFRAN) IV, sodium chloride flush    Objective: Weight change:   Intake/Output Summary (Last 24 hours) at  11/05/16 1717 Last data filed at 11/05/16 1400  Gross per 24 hour  Intake          2372.56 ml  Output             6835 ml  Net         -4462.44 ml   Blood pressure 104/66, pulse 79, temperature 98.3 F (36.8 C), temperature source Oral, resp. rate 20, height 5\' 11"  (1.803 m), weight 266 lb 1.6 oz (120.7 kg), SpO2 95 %. Temp:  [98.3 F (36.8 C)-100.3 F (37.9 C)] 98.3 F (36.8 C) (02/08 1549) Pulse Rate:  [71-116] 79 (02/08 1700) Resp:  [19-31] 20 (02/08 1700) BP: (104-164)/(50-92) 104/66 (02/08 1700) SpO2:  [88 %-97 %] 95 % (02/08 1700)  Physical Exam: General: jaundiced young male, drowsy HEENT: PERRL, scleral icterus, oropharynx clear, left IJ in place Cardiac: RRR, no rubs, murmurs or gallops Pulm: clear to auscultation bilaterally in the anterior lung fields, no wheezes, rales, or rhonchi Abd: soft, nontender, nondistended, BS present Ext: LLE  covered in bandage Neuro: responds to questions appropriately; moving all extremities freely  BMET  Recent Labs  11/04/16 0335 11/05/16 0300  NA 140 141  K 4.2 3.4*  CL 108 109  CO2 23 24  GLUCOSE 136* 116*  BUN 26* 12  CREATININE 0.96 0.65  CALCIUM 8.5* 8.9     Liver Panel   Recent Labs  11/04/16 0335 11/05/16 0300  PROT 5.6* 5.6*  ALBUMIN 1.8* 1.8*  AST 163* 130*  ALT 69* 60  ALKPHOS 116 115  BILITOT 10.3* 10.5*  BILIDIR 5.3* 5.3*  IBILI 5.0* 5.2*       Sedimentation Rate No results for input(s): ESRSEDRATE in the last 72 hours. C-Reactive Protein No results for input(s): CRP in the last 72 hours.  Micro Results: Recent Results (from the past 720 hour(s))  Culture, blood (routine x 2)     Status: None   Collection Time: 10/29/16 12:52 PM  Result Value Ref Range Status   Specimen Description BLOOD RIGHT ANTECUBITAL  Final   Special Requests BOTTLES DRAWN AEROBIC AND ANAEROBIC 5 ML  Final   Culture   Final    NO GROWTH 5 DAYS Performed at [facility], [address] [place], [place] [address]    Report Status 11/03/2016 FINAL  Final  Culture, blood (routine x 2)     Status: None   Collection Time: 10/29/16 12:56 PM  Result Value Ref Range Status   Specimen Description BLOOD LEFT HAND  Final   Special Requests IN PEDIATRIC BOTTLE 2 ML  Final   Culture   Final    NO GROWTH 5 DAYS Performed at [facility], [place] [address] [place],  [address]    Report Status 11/03/2016 FINAL  Final  MRSA PCR Screening     Status: None   Collection Time: 10/29/16  9:30 PM  Result Value Ref Range Status   MRSA by PCR NEGATIVE NEGATIVE Final    Comment:        The GeneXpert MRSA Assay (FDA approved for NASAL specimens only), is one component of a comprehensive MRSA colonization surveillance program. It is not intended to diagnose MRSA infection nor to guide or monitor treatment for MRSA infections.   Aerobic/Anaerobic Culture  (surgical/deep wound)     Status: None (Preliminary result)   Collection Time: 11/01/16 11:23 AM  Result Value Ref Range Status   Specimen Description ABSCESS RIGHT THIGH  Final  Special Requests POSTERIOR THIGH  Final   Gram Stain   Final    FEW WBC PRESENT, PREDOMINANTLY PMN RARE GRAM POSITIVE COCCI IN PAIRS    Culture   Final    FEW STREPTOCOCCUS INTERMEDIUS NO ANAEROBES ISOLATED; CULTURE IN PROGRESS FOR 5 DAYS    Report Status PENDING  Incomplete   Organism ID, Bacteria STREPTOCOCCUS INTERMEDIUS  Final      Susceptibility   Streptococcus intermedius - MIC*    PENICILLIN <=0.06 SENSITIVE Sensitive     CEFTRIAXONE <=0.12 SENSITIVE Sensitive     ERYTHROMYCIN <=0.12 SENSITIVE Sensitive     LEVOFLOXACIN 0.5 SENSITIVE Sensitive     VANCOMYCIN 0.5 SENSITIVE Sensitive     * FEW STREPTOCOCCUS INTERMEDIUS  Aerobic/Anaerobic Culture (surgical/deep wound)     Status: None (Preliminary result)   Collection Time: 11/01/16 11:42 AM  Result Value Ref Range Status   Specimen Description ABSCESS RIGHT BACK  Final   Special Requests   Final    POSTERIOR LATERAL LUMBAR L4 L5 ABSCESS POF CEFATIN AND VANC   Gram Stain   Final    MODERATE WBC PRESENT, PREDOMINANTLY PMN NO ORGANISMS SEEN    Culture   Final    FEW STREPTOCOCCUS INTERMEDIUS NO ANAEROBES ISOLATED; CULTURE IN PROGRESS FOR 5 DAYS    Report Status PENDING  Incomplete   Organism ID, Bacteria STREPTOCOCCUS INTERMEDIUS  Final      Susceptibility   Streptococcus intermedius - MIC*    PENICILLIN <=0.06 SENSITIVE Sensitive     CEFTRIAXONE <=0.12 SENSITIVE Sensitive     ERYTHROMYCIN <=0.12 SENSITIVE Sensitive     LEVOFLOXACIN 0.5 SENSITIVE Sensitive     VANCOMYCIN 0.5 SENSITIVE Sensitive     * FEW STREPTOCOCCUS INTERMEDIUS  Aerobic/Anaerobic Culture (surgical/deep wound)     Status: None (Preliminary result)   Collection Time: 11/01/16 12:01 PM  Result Value Ref Range Status   Specimen Description WOUND BACK  Final   Special  Requests LUMBAR L4 L5 FACET SPEC C POF CEFTIN AND VANC  Final   Gram Stain   Final    MODERATE WBC PRESENT, PREDOMINANTLY PMN NO ORGANISMS SEEN    Culture   Final    FEW VIRIDANS STREPTOCOCCUS NO ANAEROBES ISOLATED; CULTURE IN PROGRESS FOR 5 DAYS    Report Status PENDING  Incomplete  Aerobic/Anaerobic Culture (surgical/deep wound)     Status: None (Preliminary result)   Collection Time: 11/01/16  1:47 PM  Result Value Ref Range Status   Specimen Description TISSUE  Final   Special Requests   Final    L4 L5 DISC MATERIAL SPECIMEN CUP D PATIENT ON FOLLOWING  CEFTIN AND VANCOMYCIN    Gram Stain   Final    RARE WBC PRESENT, PREDOMINANTLY MONONUCLEAR NO ORGANISMS SEEN    Culture   Final    NO GROWTH 4 DAYS NO ANAEROBES ISOLATED; CULTURE IN PROGRESS FOR 5 DAYS   Report Status PENDING  Incomplete    Studies/Results: Dg Abd Portable 1v  Result Date: 11/03/2016 CLINICAL DATA:  OG tube placement EXAM: PORTABLE ABDOMEN - 1 VIEW COMPARISON:  11/01/2016 FINDINGS: Esophageal tube is looped in the stomach, the tip and side port overlie the mid to distal stomach. Gas-filled slightly enlarged bowel loops in the upper abdomen. Improved aeration of left lung base. IMPRESSION: Esophageal tube tip projects over the mid to distal stomach Electronically Signed   By: Donavan Foil M.D.   On: 11/03/2016 23:35      Assessment/Plan:  Principal Problem:   Epidural  abscess Active Problems:   Liver failure (HCC)   Hyponatremia   Cellulitis of right lower extremity   EtOH dependence (HCC)   Thrombocytopenia (HCC)   Cirrhosis (HCC)   Paraspinal abscess (HCC)   Chronic hepatitis C without hepatic coma (HCC)   Hepatitis   Abnormal transaminases   Alcoholic hepatitis without ascites   ETOH abuse   Abscess of right lower extremity   Cellulitis and abscess of right lower extremity   Hypernatremia   Delirium tremens ([place])   Infection of lumbar spine (HCC)  [name] is a 36 y.o. 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  Lumbar diskitis/osteomyelitis and right thigh abscess s/p debridement POD 4: Step intermedius sensitive to ceftriaxone and penicillin today but none available for Strep viridans.  -Continue either ceftriaxone if he is agreeable to [name] to avoid risk of IV misuse or oral amoxicillin/clavulanate acid if he is not for a total 6-week course of therapy [Day 1/7, Week 2/6]   LOS: 7 days   Charlott Rakes 11/05/2016, 5:17 PM

## 2016-11-06 ENCOUNTER — Inpatient Hospital Stay (HOSPITAL_COMMUNITY): Payer: BLUE CROSS/BLUE SHIELD

## 2016-11-06 DIAGNOSIS — A498 Other bacterial infections of unspecified site: Secondary | ICD-10-CM

## 2016-11-06 DIAGNOSIS — T79A9XD Traumatic compartment syndrome of other sites, subsequent encounter: Secondary | ICD-10-CM

## 2016-11-06 DIAGNOSIS — I339 Acute and subacute endocarditis, unspecified: Secondary | ICD-10-CM

## 2016-11-06 DIAGNOSIS — M01X9 Direct infection of multiple joints in infectious and parasitic diseases classified elsewhere: Secondary | ICD-10-CM

## 2016-11-06 LAB — GLUCOSE, CAPILLARY
GLUCOSE-CAPILLARY: 83 mg/dL (ref 65–99)
GLUCOSE-CAPILLARY: 100 mg/dL — AB (ref 65–99)
GLUCOSE-CAPILLARY: 97 mg/dL (ref 65–99)
Glucose-Capillary: 99 mg/dL (ref 65–99)
Glucose-Capillary: 110 mg/dL — ABNORMAL HIGH (ref 65–99)
Glucose-Capillary: 120 mg/dL — ABNORMAL HIGH (ref 65–99)
Glucose-Capillary: 116 mg/dL — ABNORMAL HIGH (ref 65–99)

## 2016-11-06 LAB — AEROBIC/ANAEROBIC CULTURE W GRAM STAIN (SURGICAL/DEEP WOUND): Culture: NO GROWTH

## 2016-11-06 LAB — ECHOCARDIOGRAM COMPLETE
HEIGHTINCHES: 71 in
WEIGHTICAEL: 4124.8 [oz_av]

## 2016-11-06 LAB — CBC
HCT: 29.7 % — ABNORMAL LOW (ref 39.0–52.0)
Hemoglobin: 9.7 g/dL — ABNORMAL LOW (ref 13.0–17.0)
MCH: 33.9 pg (ref 26.0–34.0)
MCHC: 32.7 g/dL (ref 30.0–36.0)
MCV: 103.8 fL — ABNORMAL HIGH (ref 78.0–100.0)
PLATELETS: 148 10*3/uL — AB (ref 150–400)
RBC: 2.86 MIL/uL — ABNORMAL LOW (ref 4.22–5.81)
RDW: 18.8 % — AB (ref 11.5–15.5)
WBC: 12.2 10*3/uL — ABNORMAL HIGH (ref 4.0–10.5)

## 2016-11-06 LAB — HEPATIC FUNCTION PANEL
ALBUMIN: 1.8 g/dL — AB (ref 3.5–5.0)
ALK PHOS: 110 U/L (ref 38–126)
ALT: 56 U/L (ref 17–63)
AST: 125 U/L — ABNORMAL HIGH (ref 15–41)
BILIRUBIN INDIRECT: 5.4 mg/dL — AB (ref 0.3–0.9)
BILIRUBIN TOTAL: 10.5 mg/dL — AB (ref 0.3–1.2)
Bilirubin, Direct: 5.1 mg/dL — ABNORMAL HIGH (ref 0.1–0.5)
TOTAL PROTEIN: 5.8 g/dL — AB (ref 6.5–8.1)

## 2016-11-06 LAB — BASIC METABOLIC PANEL
Anion gap: 11 (ref 5–15)
BUN: 7 mg/dL (ref 6–20)
CALCIUM: 8.3 mg/dL — AB (ref 8.9–10.3)
CHLORIDE: 102 mmol/L (ref 101–111)
CO2: 27 mmol/L (ref 22–32)
CREATININE: 0.6 mg/dL — AB (ref 0.61–1.24)
GFR calc non Af Amer: >60 mL/min (ref >60)
Glucose, Bld: 99 mg/dL (ref 65–99)
Potassium: 3.5 mmol/L (ref 3.5–5.1)
SODIUM: 140 mmol/L (ref 135–145)

## 2016-11-06 LAB — PROTIME-INR
INR: 1.89
Prothrombin Time: 22 seconds — ABNORMAL HIGH (ref 11.4–15.2)

## 2016-11-06 MED ORDER — LACTULOSE 10 GM/15ML PO SOLN
30.0000 g | Freq: Two times a day (BID) | ORAL | Status: DC
  Administered 2016-11-06 – 2016-11-12 (×11): 30 g via ORAL
  Filled 2016-11-06 (×12): qty 45

## 2016-11-06 NOTE — Progress Notes (Signed)
Daily Rounding Note  11/06/2016, 8:17 AM  LOS: 8 days   SUBJECTIVE:   Chief complaint: c/o being tied down.     Transferred out of ICU 2/8.   In soft restraints.  Denies abd pain.  He is hungry, on ice chips, meds with purees due to acute post extubation and AMS dysphagia.     OBJECTIVE:         Vital signs in last 24 hours:    Temp:  [98.1 F (36.7 C)-100.3 F (37.9 C)] 98.2 F (36.8 C) (02/09 0436) Pulse Rate:  [73-99] 99 (02/09 0436) Resp:  [19-31] 22 (02/09 0436) BP: (104-153)/(50-98) 138/72 (02/09 0436) SpO2:  [88 %-99 %] 96 % (02/09 0436) Weight:  [116.9 kg (257 lb 12.8 oz)] 116.9 kg (257 lb 12.8 oz) (02/08 2053) Last BM Date: 11/05/16 Filed Weights   10/30/16 0351 10/31/16 0315 11/05/16 2053  Weight: 119.4 kg (263 lb 4.8 oz) 120.7 kg (266 lb 1.6 oz) 116.9 kg (257 lb 12.8 oz)   General: jaundiced, icteric. Alert, oriented x 3.     Heart: RRR Chest: clear bil.  No labored breathing or cough Abdomen: soft, NT, active BS, ND.    Extremities: no CCE.   Neuro/Psych:  Oriented x 3.  Moves all 4s.  Limbs in soft restraints.  Bandaged wounds on right LE.     Lab Results:  Recent Labs  11/04/16 0335 11/05/16 0300 11/06/16 0518  WBC 16.1* 14.5* 12.2*  HGB 9.0* 9.1* 9.7*  HCT 28.1* 28.0* 29.7*  PLT 119* 127* 148*   BMET  Recent Labs  11/04/16 0335 11/05/16 0300 11/06/16 0518  NA 140 141 140  K 4.2 3.4* 3.5  CL 108 109 102  CO2 '23 24 27  ' GLUCOSE 136* 116* 99  BUN 26* 12 7  CREATININE 0.96 0.65 0.60*  CALCIUM 8.5* 8.9 8.3*   LFT  Recent Labs  11/04/16 0335 11/05/16 0300 11/06/16 0518  PROT 5.6* 5.6* 5.8*  ALBUMIN 1.8* 1.8* 1.8*  AST 163* 130* 125*  ALT 69* 60 56  ALKPHOS 116 115 110  BILITOT 10.3* 10.5* 10.5*  BILIDIR 5.3* 5.3* 5.1*  IBILI 5.0* 5.2* 5.4*   PT/INR  Recent Labs  11/05/16 0300 11/06/16 0518  LABPROT 22.9* 22.0*  INR 2.00 1.89    Scheduled Meds: . cefTRIAXone  (ROCEPHIN)  IV  2 g Intravenous Q24H  . chlorhexidine  15 mL Mouth Rinse BID  . Chlorhexidine Gluconate Cloth  6 each Topical Daily  . folic acid  1 mg Oral Daily  . lactulose  30 g Oral Q6H  . LORazepam  2 mg Intravenous Q8H  . mouth rinse  15 mL Mouth Rinse q12n4p  . multivitamin with minerals  1 tablet Oral Daily  . pantoprazole sodium  40 mg Oral Daily  . rifaximin  400 mg Oral Q8H  . risperiDONE  1 mg Oral Q12H  . sodium chloride flush  10-40 mL Intracatheter Q12H  . thiamine  100 mg Oral Daily   Continuous Infusions: . sodium chloride 75 mL/hr at 11/05/16 2108   PRN Meds:.fentaNYL, labetalol, LORazepam, midazolam, ondansetron **OR** ondansetron (ZOFRAN) IV, sodium chloride flush   ASSESMENT:   * ETOH hepatitis. Hep C +, never treated. Prednisolone d/c'd due to active infection. All LFTs, including alk phos, improved.   * S/p debridement of right thigh abscess and decompressive L4/5 laminectomy. WBCs improved  * Coagulopathy. S/p 10 mg IV Vitamin K 2/3, 2/4,  2/7, 2/8.  * AMS, ammonia max 66 on 2/6. Active withdrawal. Lactulose, Rifaximin in place.    * Macroocytic anemia.   * Thrombocytopenia, steadily improving.    PLAN   *  Suspect he can start eating, await SLP eval.  Continue Lactulose and Rifaximin.      Azucena Freed  11/06/2016, 8:17 AM Pager: [phone]   ________________________________________________________________________  Velora Heckler GI MD note:  I personally examined the patient, reviewed the data and agree with the assessment and plan described above.  He has mild remaining encephalopathy and should stay on xifaxin 550 BID and lactulose 1-2 times daily.  His bili is still elevated at 10, but that is clearly improved from its high of 16.  It may take weeks for LFTs to recover to their baseline following this severe acute illness. I told him he will need to follow up with his primary GI doctor within  3-4 weeks of discharge (Dr. Alice Reichert).   Most important for the health of his liver and probably his overall health as well is that he never resume drinking alcohol.  Please call or page with any further questions or concerns.     Owens Loffler, MD [facility] Pager [phone]

## 2016-11-06 NOTE — Progress Notes (Signed)
Physical Therapy Treatment Patient Details Name: [name] MRN: [id number] DOB: 1981-03-20 Today's Date: 11/06/2016    History of Present Illness 36 yo male developed swelling in Rt leg and foot, and back pain.  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  He was seen by orthopedics, and started on IV abx. 2/04 surgery for decompressive posterior lumbar laminectomy L4-5 and I&D of right thigh abscess  He has hx of Hepatitis C and ETOH.  He was noted to have jaundice and was seen by GI.  He was started on prednisone for alcoholic hepatitis.  He was also seen by ID.  He developed progressive altered mental status due to delirium tremens.  He was transferred to ICU and intubated 2/3; self extubated 2/7.       PT Comments    Pt was seen for evaluation of his brace with gait and transfers, instructed back precautions throughout.  Pt is still recommended for [name], and is getting his clamshell TLSO refitted today to adjust for abdominal fluid loss that has occurred since first fit.  Will progress gait and strengthening to LE's as able, with pt demonstrating some difficulty with instructions at times but overall following as able with 3 person assist needed for longer walk and close guard of the chair.    Follow Up Recommendations  [name]     Equipment Recommendations  None recommended by PT    Recommendations for Other Services Rehab consult     Precautions / Restrictions Precautions Precautions: Back Precaution Booklet Issued: No Precaution Comments: verbally reviewed but pt is somewhat confused Restrictions Weight Bearing Restrictions: No    Mobility  Bed Mobility Overal bed mobility: Needs Assistance Bed Mobility: Supine to Sit Rolling: Max assist;+2 for physical assistance;+2 for safety/equipment Sidelying to sit: Max assist;+2 for physical assistance;+2 for safety/equipment       General bed mobility comments:  max cues with multimodal directions, sequencing and prompts for back precautions  Transfers Overall transfer level: Needs assistance Equipment used: Rolling walker (2 wheeled);2 person hand held assist Transfers: Sit to/from Stand Sit to Stand: Mod assist;+2 physical assistance;+2 safety/equipment;From elevated surface         General transfer comment: cues for hand placement and for directing sequence to sit  Ambulation/Gait Ambulation/Gait assistance: Min assist;Mod assist;+2 physical assistance;+2 safety/equipment (progression of help to increase as pt fatigues) Ambulation Distance (Feet): 55 Feet Assistive device: Rolling walker (2 wheeled);2 person hand held assist;Pushed wheelchair (third person to maintain chair behind pt continually) Gait Pattern/deviations: Step-through pattern;Decreased stride length;Wide base of support;Drifts right/left;Trunk flexed (cues continually for upright posture with TLSO on) Gait velocity: reduced Gait velocity interpretation: Below normal speed for age/gender General Gait Details: TLSO on but loose, notified nursing afterward to have orthotist return to refit   Stairs            Wheelchair Mobility    Modified Rankin (Stroke Patients Only)       Balance     Sitting balance-Leahy Scale: Fair Sitting balance - Comments: using UE's to support his spine   Standing balance support: Bilateral upper extremity supported Standing balance-Leahy Scale: Poor Standing balance comment: lists to the R as he fatigues                    Cognition Arousal/Alertness: Awake/alert Behavior During Therapy: Restless;Impulsive Overall Cognitive Status: Impaired/Different from baseline Area of Impairment: Attention;Following commands;Memory;Safety/judgement;Awareness;Problem solving;Orientation Orientation Level: Situation Current Attention  Level: Selective Memory: Decreased recall of precautions;Decreased short-term memory Following  Commands: Follows one step commands with increased time;Follows one step commands inconsistently Safety/Judgement: Decreased awareness of safety;Decreased awareness of deficits Awareness: Intellectual Problem Solving: Slow processing;Difficulty sequencing;Requires verbal cues;Requires tactile cues      Exercises      General Comments General comments (skin integrity, edema, etc.): pt has a TLSO on with very loose fit despite being fully tightened, using in chair for now to protect the spine, and nursing has [employer] returning today to refit      Pertinent Vitals/Pain Pain Assessment: 0-10 Pain Score: 2  Faces Pain Scale: Hurts a little bit Pain Intervention(s): Limited activity within patient's tolerance;Monitored during session;Premedicated before session;Repositioned    Home Living                      Prior Function            PT Goals (current goals can now be found in the care plan section) Progress towards PT goals: Progressing toward goals    Frequency    Min 4X/week      PT Plan Current plan remains appropriate    Co-evaluation PT/OT/SLP Co-Evaluation/Treatment: Yes Reason for Co-Treatment: Complexity of the patient's impairments (multi-system involvement);Necessary to address cognition/behavior during functional activity;For patient/therapist safety;To address functional/ADL transfers PT goals addressed during session: Mobility/safety with mobility;Balance;Proper use of DME       End of Session Equipment Utilized During Treatment: Back brace Activity Tolerance: Patient tolerated treatment well;Patient limited by fatigue;Patient limited by lethargy Patient left: in chair;with call bell/phone within reach;with chair alarm set;with nursing/sitter in room;Other (comment) (SLP arrived to perform a procedure)     Time: [id number] PT Time Calculation (min) (ACUTE ONLY): 35 min  Charges:  $Gait Training: 8-22 mins $Therapeutic Activity: 8-22 mins                     G Codes:      Ramond Dial Nov 23, 2016, 1:59 PM   Mee Hives, PT MS Acute Rehab Dept. Number: [place] [facility] [place]

## 2016-11-06 NOTE — Procedures (Signed)
Objective Swallowing Evaluation: Type of Study: FEES-Fiberoptic Endoscopic Evaluation of Swallow  Patient Details  Name: [name] MRN: [id number] Date of Birth: [birth year]  Today's Date: 11/06/2016 Time: SLP Start Time (ACUTE ONLY): 1315-SLP Stop Time (ACUTE ONLY): 1345 SLP Time Calculation (min) (ACUTE ONLY): 30 min  Past Medical History:  Past Medical History:  Diagnosis Date  . Hepatitis C    Past Surgical History:  Past Surgical History:  Procedure Laterality Date  . DECOMPRESSIVE LUMBAR LAMINECTOMY LEVEL 1 N/A 11/01/2016   Procedure: DECOMPRESSIVE POSTERIOR LUMBAR LAMINECTOMY LEVEL 4-5;  Surgeon: Melina Schools, MD;  Location: [place];  Service: Orthopedics;  Laterality: N/A;  . HERNIA REPAIR    . I&D EXTREMITY Right 11/01/2016   Procedure: IRRIGATION AND DEBRIDEMENT ABSCESS RIGHT THIGH;  Surgeon: Melina Schools, MD;  Location: [place];  Service: Orthopedics;  Laterality: Right;   HPI: 36 yo male developed swelling in Rt leg and foot, and back pain. 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 He was seen by orthopedics, and started on IV abx. 2/04 surgery for decompressive posterior lumbar laminectomy L4-5 and I&D of right thigh abscess He has hx of Hepatitis C and ETOH. He was noted to have jaundice and was seen by GI. He was started on prednisone for alcoholic hepatitis. He was also seen by ID. He developed progressive altered mental status due to delirium tremens. He was transferred to ICU and intubated 2/3; self extubated 2/7.    Subjective: confused   Assessment / Plan / Recommendation  CHL IP CLINICAL IMPRESSIONS 11/06/2016  Therapy Diagnosis WFL  Clinical Impression Pt presents with functional oropharyngeal swallow, with appearance of good vocal fold mobility.  Swallow onset is timely; there was no penetration nor aspiration of any consistency, even with large consecutive thin liquid swallows.  No  pharyngeal residue post-swallow.  Recommend beginning a regular diet with thin liquids, meds whole in liquids.  Pt will need assistance with tray set-up and potentially with self feeding secondary to cognitive deficits.  SLP to follow briefly to ensure safety given fluctuating mentation.  Please order cognitive/linguistic evaluation.   Impact on safety and function No limitations      CHL IP TREATMENT RECOMMENDATION 11/04/2016  Treatment Recommendations Therapy as outlined in treatment plan below     Prognosis 11/04/2016  Prognosis for Safe Diet Advancement Good  Barriers to Reach Goals --  Barriers/Prognosis Comment --    CHL IP DIET RECOMMENDATION 11/06/2016  SLP Diet Recommendations Regular solids;Thin liquid  Liquid Administration via Straw;Cup  Medication Administration Whole meds with liquid  Compensations Minimize environmental distractions  Postural Changes --      CHL IP OTHER RECOMMENDATIONS 11/06/2016  Recommended Consults --  Oral Care Recommendations Oral care BID  Other Recommendations --      CHL IP FOLLOW UP RECOMMENDATIONS 11/06/2016  Follow up Recommendations Inpatient Rehab      CHL IP FREQUENCY AND DURATION 11/06/2016  Speech Therapy Frequency (ACUTE ONLY) min 1 x/week  Treatment Duration 1 week           CHL IP ORAL PHASE 11/06/2016  Oral Phase WFL  Oral - Pudding Teaspoon --  Oral - Pudding Cup --  Oral - Honey Teaspoon --  Oral - Honey Cup --  Oral - Nectar Teaspoon --  Oral - Nectar Cup --  Oral - Nectar Straw --  Oral - Thin Teaspoon --  Oral - Thin Cup --  Oral -  Thin Straw --  Oral - Puree --  Oral - Mech Soft --  Oral - Regular --  Oral - Multi-Consistency --  Oral - Pill --  Oral Phase - Comment --    CHL IP PHARYNGEAL PHASE 11/06/2016  Pharyngeal Phase WFL  Pharyngeal- Pudding Teaspoon --  Pharyngeal --  Pharyngeal- Pudding Cup --  Pharyngeal --  Pharyngeal- Honey Teaspoon --  Pharyngeal --  Pharyngeal- Honey Cup --  Pharyngeal --   Pharyngeal- Nectar Teaspoon --  Pharyngeal --  Pharyngeal- Nectar Cup --  Pharyngeal --  Pharyngeal- Nectar Straw --  Pharyngeal --  Pharyngeal- Thin Teaspoon --  Pharyngeal --  Pharyngeal- Thin Cup --  Pharyngeal --  Pharyngeal- Thin Straw --  Pharyngeal --  Pharyngeal- Puree --  Pharyngeal --  Pharyngeal- Mechanical Soft --  Pharyngeal --  Pharyngeal- Regular --  Pharyngeal --  Pharyngeal- Multi-consistency --  Pharyngeal --  Pharyngeal- Pill --  Pharyngeal --  Pharyngeal Comment --     No flowsheet data found.  No flowsheet data found.  [name] 11/06/2016, 2:02 PM

## 2016-11-06 NOTE — Progress Notes (Signed)
Speech Language Pathology Treatment: Dysphagia  Patient Details Name: [name] MRN: [id number] DOB: Dec 22, 1980 Today's Date: 11/06/2016 Time: [id number] SLP Time Calculation (min) (ACUTE ONLY): 18 min  Assessment / Plan / Recommendation Clinical Impression  Improved MS today, though still groggy and hallucinatory (sees his mother in room despite her absence).  Demonstrated improved recognition/anticipation food material, improved active mastication and effort. Consumed purees and water with s/s of dysphagia, but he is ready for instrumental swallow study.  Plan for FEES 1300 today.  D/W RN.    HPI HPI: 36 yo male developed swelling in Rt leg and foot, and back pain. 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 He was seen by orthopedics, and started on IV abx. 2/04 surgery for decompressive posterior lumbar laminectomy L4-5 and I&D of right thigh abscess He has hx of Hepatitis C and ETOH. He was noted to have jaundice and was seen by GI. He was started on prednisone for alcoholic hepatitis. He was also seen by ID. He developed progressive altered mental status due to delirium tremens. He was transferred to ICU and intubated 2/3; self extubated 2/7.        SLP Plan  Other (Comment) (FEES)     Recommendations  Diet recommendations: NPO Medication Administration: Crushed with puree                Plan: Other (Comment) (FEES)       GO                [name] 11/06/2016, 10:52 AM

## 2016-11-06 NOTE — Progress Notes (Signed)
PROGRESS NOTE    [name]  [id number] DOB: 1980/11/17 DOA: 10/29/2016 PCP: Nicholes Rough, PA-C     Brief Narrative:  36 y.o.WM PMHx Hepatitis C, Liver Cirrhosis per wife, last records LFT AST 74, AST 163, Alkaline phosphatase 119, Bili 1.2, EtOH abuse still drinks occasional alcohol, not significant amount per wife, he presents complaining of worsening back pain and right lower extremity edema and redness. He report having back pain since January 19. He was seeing in the ED and discharge on pain regimen. He went to see Dr Suella Broad with ortho and underwent MRI which showed L 4 -5 right facet joint septic arthritis, and soft tissue abscess within the adjacent posterior Paraspinous musculature 1.7* 1.3* 4.6.  He report pain as sharp, shooting pain, radiates to right LE. Is difficult for him to walk due to pain and swelling right LE.  He denies chest pain or dyspnea. No BM in 3 days. His wife just notice that he became jaundice 1 day prior to admission. Also wife reported some confusion early on the day of admission.      Subjective: 2/9   A/O 4. States pain in RLE but significantly improved since surgery. Also states back pain significantly improved postsurgery      Assessment & Plan:   Principal Problem:   Epidural abscess Active Problems:   Liver failure (HCC)   Hyponatremia   Cellulitis of right lower extremity   EtOH dependence (HCC)   Thrombocytopenia (HCC)   Cirrhosis (HCC)   Paraspinal abscess (HCC)   Chronic hepatitis C without hepatic coma (HCC)   Hepatitis   Abnormal transaminases   Alcoholic hepatitis without ascites   ETOH abuse   Abscess of right lower extremity   Cellulitis and abscess of right lower extremity   Hypernatremia   Delirium tremens ([place])   Infection of lumbar spine (HCC)   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 Orthopedics would like to operate on him but a concern  about rest of anesthesia. Patient has been seen by gastroenterology will defer operative risk in the context of his liver disease to gastroenterology. Per GI note patient's meld score is 24 with a discriminant function = 66 and although patient is at increased risk for hepatic decompensation, morbidity or mortality per GI risk will not likely changed for the next one to 2 months. Blood cultures pending. Patient admission in consultation by the were recommending continued IV vancomycin and narrowing IV Zosyn to IV cefepime. Appreciate ID input and recommendations. Per orthopedics.  RLE Posterior Compartment Abscesses/Abscess Sciatic/Femoral nerve/ right lower extremity cellulitis -Spoke with Dr. Matt:[facility], who review case and discuss with Dr. Rolena Infante. Decision on how we will treat RLE abscess to be determined. Patient and wife aware of MRI findings and discussion to take place -Continue current antibiotic regimen.  -ADDENDUM: Spoke with Dr Duane Lope D. Brooks. Orthopedic and Spinal surgery who talked  with family today at 71. Surgery scheduled for 2/4 -2/3 transfuse 4 units FFP -2/3 vitamin K IV 10 mg -2/3 ordered 2 units platelets on call for surgery Have already briefly spoken with mother and explained any surgery would be extremely high risk, and requested that her and his wife in any other family member be present at 31 to speak with surgeons concerning possible surgery.  Acute Hepatic failure -Bilirubin continues to trend up.  -INR trending up  Recent Labs Lab 11/03/16 0456 11/03/16 0754 11/04/16 0335 11/05/16 0300 11/06/16 0518  INR 2.00 2.05 2.15 2.00 1.89  -if all surgeons agree to perform surgery and patient/family agrees to accept risk patient will require FFP, vitamin K, platelets in order to prepare for surgery. -Per gastroenterology likely secondary to EtOH hepatitis in the setting of underlying Hepatitis C. Meld score is  24 with a discriminant function of  66.  -Patient has been started on prednisone 40 mg daily per GI recommendations.   Positive Hepatitis C -Most likely also contributing to patient's liver failure unfortunately -Treatment if any per ID  ETOH dependence -Continue CIWA protocol.  -Thiamine, folic acid, multivitamin.  Thrombocytopenia -Likely secondary to ETOH Abuse. Patient with no overt bleeding. Platelet function trending back up. Follow.  Hyponatremia -Slowly improving.     DVT prophylaxis: None: Hypercoagulable Code Status: Full Family Communication: Wife present for discussion of plan of care Disposition Plan: ??   Consultants:  Dr Duane Lope D. Brooks. Orthopedic and Spinal surgery ID GI   Procedures/Significant Events:  Outside L-spine MRI : showed L 4 -5 right facet joint septic arthritis, and soft tissue abscess within the adjacent posterior Paraspinous musculature 2/3 MRI Right femur/RLE W/WO contrast:-Extensive abscesses involving the posterior compartment of the thigh along with myofasciitis and cellulitis.  -Abscess all along the course of the sciatic/femoral nerve which is thickened and inflamed -Negative Septic arthritis or osteomyelitis. -Cellulitis below the knee but no myofasciitis or pyomyositis. --2/3 transfuse 4 units FFP    VENTILATOR SETTINGS:    Cultures 2/1 blood 2 NGTD 2/1 MRSA by PCR negative 2/1 Hepatitis C positive     Antimicrobials: Anti-infectives    Start     Stop   10/30/16 1000  ceFEPIme (MAXIPIME) 2 g in dextrose 5 % 50 mL IVPB         10/29/16 2200  vancomycin (VANCOCIN) IVPB 1000 mg/200 mL premix         10/29/16 2200  piperacillin-tazobactam (ZOSYN) IVPB 3.375 g  Status:  Discontinued     10/29/16 1430   10/29/16 1300  vancomycin (VANCOCIN) IVPB 1000 mg/200 mL premix     10/29/16 1519   10/29/16 1300  piperacillin-tazobactam (ZOSYN) IVPB 3.375 g     10/29/16 1340       Devices   LINES / TUBES:      Continuous Infusions: . sodium  chloride 75 mL/hr at 11/05/16 2108     Objective: Vitals:   11/05/16 2053 11/06/16 0028 11/06/16 0333 11/06/16 0436  BP:  (!) 153/83 (!) 111/98 138/72  Pulse:  81  99  Resp:  (!) 21  (!) 22  Temp:  98.1 F (36.7 C)  98.2 F (36.8 C)  TempSrc:  Oral  Oral  SpO2:  96%  96%  Weight: 116.9 kg (257 lb 12.8 oz)     Height:        Intake/Output Summary (Last 24 hours) at 11/06/16 0812 Last data filed at 11/06/16 0500  Gross per 24 hour  Intake          1922.04 ml  Output             5200 ml  Net         -3277.96 ml   Filed Weights   10/30/16 0351 10/31/16 0315 11/05/16 2053  Weight: 119.4 kg (263 lb 4.8 oz) 120.7 kg (266 lb 1.6 oz) 116.9 kg (257 lb 12.8 oz)    Examination:  General: A/O 4, positive acute RLE pain, No acute respiratory distress Eyes: negative scleral hemorrhage, negative anisocoria, negative icterus  ENT: Negative Runny nose, negative gingival bleeding, Neck:  Negative scars, masses, torticollis, lymphadenopathy, JVD Lungs: Clear to auscultation bilaterally without wheezes or crackles Cardiovascular: Regular rate and rhythm without murmur gallop or rub normal S1 and S2 Abdomen: negative abdominal pain, nondistended, positive soft, bowel sounds, no rebound, no ascites, no appreciable mass Extremities: RLE 5 swelling, pain to palpation, erythema. Bilateral upper extremity tremors Skin: Negative rashes, lesions, ulcers Psychiatric:  Negative depression, negative anxiety, negative fatigue, negative mania  Central nervous system:  Cranial nerves II through XII intact, tongue/uvula midline, all extremities muscle strength 5/5, sensation intact throughout, negative dysarthria, negative expressive aphasia, negative receptive aphasia.  .     Data Reviewed: Care during the described time interval was provided by me .  I have reviewed this patient's available data, including medical history, events of note, physical examination, and all test results as part of my  evaluation. I have personally reviewed and interpreted all radiology studies.  CBC:  Recent Labs Lab 10/31/16 0416  11/01/16 1848 11/02/16 0153 11/03/16 0456 11/04/16 0335 11/05/16 0300 11/06/16 0518  WBC 17.6*  --  12.1* 13.5* 9.9 16.1* 14.5* 12.2*  NEUTROABS 10.5*  --  8.4*  --   --   --   --   --   HGB 11.8*  < > 8.7* 8.7* 9.3* 9.0* 9.1* 9.7*  HCT 34.4*  < > 25.3* 25.3* 28.2* 28.1* 28.0* 29.7*  MCV 95.0  --  96.9 97.3 99.3 101.8* 102.2* 103.8*  PLT 57*  --  83* 107* 99* 119* 127* 148*  < > = values in this interval not displayed. Basic Metabolic Panel:  Recent Labs Lab 10/31/16 0416 11/01/16 0338  11/02/16 0153 11/02/16 1628 11/03/16 0456 11/03/16 1700 11/04/16 0335 11/05/16 0300 11/06/16 0518  NA 133* 132*  < > 137  --  138  --  140 141 140  K 3.6 4.5  < > 4.2  --  3.5  --  4.2 3.4* 3.5  CL 102 104  --  107  --  105  --  108 109 102  CO2 23 25  --  24  --  24  --  23 24 27   GLUCOSE 80 150*  --  117*  --  109*  --  136* 116* 99  BUN 7 11  --  19  --  18  --  26* 12 7  CREATININE 0.73 0.67  --  0.83  --  0.65  --  0.96 0.65 0.60*  CALCIUM 7.7* 7.7*  --  7.8*  --  8.3*  --  8.5* 8.9 8.3*  MG 2.1 2.0  --   --  2.4 2.1 2.0  --   --   --   PHOS  --   --   --   --  4.7* 3.6 4.4  --   --   --   < > = values in this interval not displayed. GFR: Estimated Creatinine Clearance: 167.5 mL/min (by C-G formula based on SCr of 0.6 mg/dL (L)). Liver Function Tests:  Recent Labs Lab 11/02/16 0153 11/03/16 0754 11/04/16 0335 11/05/16 0300 11/06/16 0518  AST 175* 162* 163* 130* 125*  ALT 67* 73* 69* 60 56  ALKPHOS 123 123 116 115 110  BILITOT 11.2* 10.1* 10.3* 10.5* 10.5*  PROT 5.4* 6.4* 5.6* 5.6* 5.8*  ALBUMIN 2.0* 1.8* 1.8* 1.8* 1.8*   No results for input(s): LIPASE, AMYLASE in the last 168 hours.  Recent Labs Lab 10/31/16 1920 11/01/16  [id number] 11/02/16 0153 11/03/16 1241  AMMONIA 42* 63* 51* 66*   Coagulation Profile:  Recent Labs Lab 11/03/16 0456  11/03/16 0754 11/04/16 0335 11/05/16 0300 11/06/16 0518  INR 2.00 2.05 2.15 2.00 1.89   Cardiac Enzymes: No results for input(s): CKTOTAL, CKMB, CKMBINDEX, TROPONINI in the last 168 hours. BNP (last 3 results) No results for input(s): PROBNP in the last 8760 hours. HbA1C: No results for input(s): HGBA1C in the last 72 hours. CBG:  Recent Labs Lab 11/05/16 1928 11/05/16 2149 11/06/16 0102 11/06/16 0408 11/06/16 0738  GLUCAP 98 109* 99 83 100*   Lipid Profile: No results for input(s): CHOL, HDL, LDLCALC, TRIG, CHOLHDL, LDLDIRECT in the last 72 hours. Thyroid Function Tests: No results for input(s): TSH, T4TOTAL, FREET4, T3FREE, THYROIDAB in the last 72 hours. Anemia Panel: No results for input(s): VITAMINB12, FOLATE, FERRITIN, TIBC, IRON, RETICCTPCT in the last 72 hours. Sepsis Labs:  Recent Labs Lab 11/01/16 [id number]  LATICACIDVEN 0.9    Recent Results (from the past 240 hour(s))  Culture, blood (routine x 2)     Status: None   Collection Time: 10/29/16 12:52 PM  Result Value Ref Range Status   Specimen Description BLOOD RIGHT ANTECUBITAL  Final   Special Requests BOTTLES DRAWN AEROBIC AND ANAEROBIC 5 ML  Final   Culture   Final    NO GROWTH 5 DAYS Performed at [facility], [address], [place], [place] [address]    Report Status 11/03/2016 FINAL  Final  Culture, blood (routine x 2)     Status: None   Collection Time: 10/29/16 12:56 PM  Result Value Ref Range Status   Specimen Description BLOOD LEFT HAND  Final   Special Requests IN PEDIATRIC BOTTLE 2 ML  Final   Culture   Final    NO GROWTH 5 DAYS Performed at [facility], [place] [address], [place], [place] [address]    Report Status 11/03/2016 FINAL  Final  MRSA PCR Screening     Status: None   Collection Time: 10/29/16  9:30 PM  Result Value Ref Range Status   MRSA by PCR NEGATIVE NEGATIVE Final    Comment:        The GeneXpert MRSA Assay (FDA approved for NASAL specimens only), is one  component of a comprehensive MRSA colonization surveillance program. It is not intended to diagnose MRSA infection nor to guide or monitor treatment for MRSA infections.   Aerobic/Anaerobic Culture (surgical/deep wound)     Status: None (Preliminary result)   Collection Time: 11/01/16 11:23 AM  Result Value Ref Range Status   Specimen Description ABSCESS RIGHT THIGH  Final   Special Requests POSTERIOR THIGH  Final   Gram Stain   Final    FEW WBC PRESENT, PREDOMINANTLY PMN RARE GRAM POSITIVE COCCI IN PAIRS    Culture   Final    FEW STREPTOCOCCUS INTERMEDIUS NO ANAEROBES ISOLATED; CULTURE IN PROGRESS FOR 5 DAYS    Report Status PENDING  Incomplete   Organism ID, Bacteria STREPTOCOCCUS INTERMEDIUS  Final      Susceptibility   Streptococcus intermedius - MIC*    PENICILLIN <=0.06 SENSITIVE Sensitive     CEFTRIAXONE <=0.12 SENSITIVE Sensitive     ERYTHROMYCIN <=0.12 SENSITIVE Sensitive     LEVOFLOXACIN 0.5 SENSITIVE Sensitive     VANCOMYCIN 0.5 SENSITIVE Sensitive     * FEW STREPTOCOCCUS INTERMEDIUS  Aerobic/Anaerobic Culture (surgical/deep wound)     Status: None (Preliminary result)   Collection Time: 11/01/16 11:42  AM  Result Value Ref Range Status   Specimen Description ABSCESS RIGHT BACK  Final   Special Requests   Final    POSTERIOR LATERAL LUMBAR L4 L5 ABSCESS POF CEFATIN AND VANC   Gram Stain   Final    MODERATE WBC PRESENT, PREDOMINANTLY PMN NO ORGANISMS SEEN    Culture   Final    FEW STREPTOCOCCUS INTERMEDIUS NO ANAEROBES ISOLATED; CULTURE IN PROGRESS FOR 5 DAYS    Report Status PENDING  Incomplete   Organism ID, Bacteria STREPTOCOCCUS INTERMEDIUS  Final      Susceptibility   Streptococcus intermedius - MIC*    PENICILLIN <=0.06 SENSITIVE Sensitive     CEFTRIAXONE <=0.12 SENSITIVE Sensitive     ERYTHROMYCIN <=0.12 SENSITIVE Sensitive     LEVOFLOXACIN 0.5 SENSITIVE Sensitive     VANCOMYCIN 0.5 SENSITIVE Sensitive     * FEW STREPTOCOCCUS INTERMEDIUS    Aerobic/Anaerobic Culture (surgical/deep wound)     Status: None (Preliminary result)   Collection Time: 11/01/16 12:01 PM  Result Value Ref Range Status   Specimen Description WOUND BACK  Final   Special Requests LUMBAR L4 L5 FACET SPEC C POF CEFTIN AND VANC  Final   Gram Stain   Final    MODERATE WBC PRESENT, PREDOMINANTLY PMN NO ORGANISMS SEEN    Culture   Final    FEW VIRIDANS STREPTOCOCCUS NO ANAEROBES ISOLATED; CULTURE IN PROGRESS FOR 5 DAYS    Report Status PENDING  Incomplete  Aerobic/Anaerobic Culture (surgical/deep wound)     Status: None (Preliminary result)   Collection Time: 11/01/16  1:47 PM  Result Value Ref Range Status   Specimen Description TISSUE  Final   Special Requests   Final    L4 L5 DISC MATERIAL SPECIMEN CUP D PATIENT ON FOLLOWING  CEFTIN AND VANCOMYCIN    Gram Stain   Final    RARE WBC PRESENT, PREDOMINANTLY MONONUCLEAR NO ORGANISMS SEEN    Culture   Final    NO GROWTH 4 DAYS NO ANAEROBES ISOLATED; CULTURE IN PROGRESS FOR 5 DAYS   Report Status PENDING  Incomplete         Radiology Studies: No results found.      Scheduled Meds: . cefTRIAXone (ROCEPHIN)  IV  2 g Intravenous Q24H  . chlorhexidine  15 mL Mouth Rinse BID  . Chlorhexidine Gluconate Cloth  6 each Topical Daily  . folic acid  1 mg Oral Daily  . lactulose  30 g Oral Q6H  . LORazepam  2 mg Intravenous Q8H  . mouth rinse  15 mL Mouth Rinse q12n4p  . multivitamin with minerals  1 tablet Oral Daily  . pantoprazole sodium  40 mg Oral Daily  . rifaximin  400 mg Oral Q8H  . risperiDONE  1 mg Oral Q12H  . sodium chloride flush  10-40 mL Intracatheter Q12H  . thiamine  100 mg Oral Daily   Continuous Infusions: . sodium chloride 75 mL/hr at 11/05/16 2108     LOS: 8 days    Time spent:90 min    Aidel Davisson, Geraldo Docker, MD [facility] Pager [phone]  If 7PM-7AM, please contact night-coverage [email or web address] Password [email or web address] 11/06/2016, 8:12 AM

## 2016-11-06 NOTE — Progress Notes (Signed)
  Echocardiogram 2D Echocardiogram has been performed.  Donata Clay 11/06/2016, 12:00 PM

## 2016-11-06 NOTE — Progress Notes (Signed)
    Subjective: Procedure(s) (LRB): DECOMPRESSIVE POSTERIOR LUMBAR LAMINECTOMY LEVEL 4-5 (N/A) IRRIGATION AND DEBRIDEMENT ABSCESS RIGHT THIGH (Right) 5 Days Post-Op  Patient reports pain as 4 on 0-10 scale.  Reports decreased leg pain reports incisional back pain   N/A void N/A bowel movement Positive flatus Negative chest pain or shortness of breath  Objective: Vital signs in last 24 hours: Temp:  [98.1 F (36.7 C)-100.3 F (37.9 C)] 98.2 F (36.8 C) (02/09 0436) Pulse Rate:  [73-99] 99 (02/09 0436) Resp:  [19-31] 22 (02/09 0436) BP: (104-153)/(50-98) 138/72 (02/09 0436) SpO2:  [88 %-99 %] 96 % (02/09 0436) Weight:  [116.9 kg (257 lb 12.8 oz)] 116.9 kg (257 lb 12.8 oz) (02/08 2053)  Intake/Output from previous day: 02/08 0701 - 02/09 0700 In: 2027.2 [P.O.:120; I.V.:1507.2; IV Piggyback:100] Out: 5800 [Urine:5800]  Labs:  Recent Labs  11/05/16 0300 11/06/16 0518  WBC 14.5* 12.2*  RBC 2.74* 2.86*  HCT 28.0* 29.7*  PLT 127* 148*    Recent Labs  11/05/16 0300 11/06/16 0518  NA 141 140  K 3.4* 3.5  CL 109 102  CO2 24 27  BUN 12 7  CREATININE 0.65 0.60*  GLUCOSE 116* 99  CALCIUM 8.9 8.3*    Recent Labs  11/05/16 0300 11/06/16 0518  INR 2.00 1.89    Physical Exam: ABD soft Intact pulses distally Incision: dressing C/D/I Compartment soft Moving all extremities.  Restrained for safety.  EHL/TA/GA intact (global weakness is noted)  Assessment/Plan: Patient stable  xrays  NA Continue mobilization with physical therapy Continue care  Advance diet Up with therapy  Mobilization with TLSO Continue care Mental status still some confusion.  However this is improved from last week Continue care  Melina Schools, MD [place] [phone]

## 2016-11-06 NOTE — Progress Notes (Signed)
Occupational Therapy Treatment Patient Details Name: [name] MRN: [id number] DOB: 1980/12/02 Today's Date: 11/06/2016    History of present illness 36 yo male developed swelling in Rt leg and foot, and back pain.  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  He was seen by orthopedics, and started on IV abx. 2/04 surgery for decompressive posterior lumbar laminectomy L4-5 and I&D of right thigh abscess  He has hx of Hepatitis C and ETOH.  He was noted to have jaundice and was seen by GI.  He was started on prednisone for alcoholic hepatitis.  He was also seen by ID.  He developed progressive altered mental status due to delirium tremens.  He was transferred to ICU and intubated 2/3; self extubated 2/7.      OT comments  Pt making progress with functional goals. Pt continues to require extensive assist with LB ADLs with cognitive  impairments, decreased safety/impulsivisty requiring multimodal cues. Pt able to log roll to sit EOB, stand at [facility] for transfers and ambulate with RW ~20-30 feet with +2 assist for safety. OT will continue to follow acutely  Follow Up Recommendations  [name];Supervision/Assistance - 24 hour    Equipment Recommendations       Recommendations for Other Services Rehab consult    Precautions / Restrictions Precautions Precautions: Back Precaution Booklet Issued: No Precaution Comments: verbally reviewed but pt is somewhat confused Restrictions Weight Bearing Restrictions: No       Mobility Bed Mobility Overal bed mobility: Needs Assistance Bed Mobility: Supine to Sit Rolling: Max assist;+2 for physical assistance;+2 for safety/equipment Sidelying to sit: Max assist;+2 for physical assistance;+2 for safety/equipment       General bed mobility comments: max cues with multimodal directions, sequencing and prompts for back precautions  Transfers Overall transfer level: Needs  assistance Equipment used: Rolling walker (2 wheeled);2 person hand held assist Transfers: Sit to/from Stand Sit to Stand: Mod assist;+2 physical assistance;+2 safety/equipment;From elevated surface Stand pivot transfers: +2 physical assistance;+2 safety/equipment;Max assist       General transfer comment: cues for hand placement and for directing sequence to sit    Balance Overall balance assessment: Needs assistance Sitting-balance support: Feet supported;Bilateral upper extremity supported Sitting balance-Leahy Scale: Fair Sitting balance - Comments: using UE's to support his spine   Standing balance support: Bilateral upper extremity supported Standing balance-Leahy Scale: Poor Standing balance comment: leans to the R as he fatigues                   ADL               Lower Body Bathing: Maximal assistance;Sit to/from stand (simulated)           Toilet Transfer: Maximal assistance;+2 for physical assistance;Stand-pivot;Moderate assistance;RW;Ambulation           Functional mobility during ADLs: Maximal assistance;+2 for physical assistance;Cueing for safety;Cueing for sequencing;Moderate assistance General ADL Comments: multimodal cues required. Pt unbale to recall any back precautions or brace donning technique                                      Cognition   Behavior During Therapy: [facility] for tasks assessed/performed Overall Cognitive Status: Impaired/Different from baseline Area of Impairment: Attention;Following commands;Memory;Safety/judgement;Awareness;Problem solving;Orientation Orientation Level: Situation Current Attention Level: Selective Memory: Decreased recall of precautions;Decreased short-term memory  Following  Commands: Follows one step commands with increased time;Follows one step commands inconsistently Safety/Judgement: Decreased awareness of safety;Decreased awareness of deficits Awareness: Intellectual Problem  Solving: Slow processing;Difficulty sequencing;Requires verbal cues;Requires tactile cues                                 General Comments  pt pleasant and cooperative, brother very supportive    Pertinent Vitals/ Pain       Pain Assessment: Faces Pain Score: 2  Faces Pain Scale: No hurt Pain Intervention(s): Monitored during session                                                          Frequency  Min 2X/week        Progress Toward Goals  OT Goals(current goals can now be found in the care plan section)  Progress towards OT goals: Progressing toward goals  Acute Rehab OT Goals Patient Stated Goal: make progress OT Goal Formulation: With family  Plan Discharge plan remains appropriate    Co-evaluation    PT/OT/SLP Co-Evaluation/Treatment: Yes Reason for Co-Treatment: Complexity of the patient's impairments (multi-system involvement);For patient/therapist safety;Necessary to address cognition/behavior during functional activity PT goals addressed during session: Mobility/safety with mobility;Balance;Proper use of DME OT goals addressed during session: ADL's and self-care;Proper use of Adaptive equipment and DME      End of Session Equipment Utilized During Treatment: Gait belt;Back brace   Activity Tolerance Patient limited by fatigue   Patient Left in chair;with call bell/phone within reach;Other (comment);with chair alarm set;with family/visitor present;with nursing/sitter in room (SLP)   Nurse Communication Mobility status;Other (comment) (call [employer] to adjust brace)        Time: [id number] OT Time Calculation (min): 31 min  Charges: OT General Charges $OT Visit: 1 Procedure OT Treatments $Therapeutic Activity: 8-22 mins  [name] 11/06/2016, 2:26 PM

## 2016-11-06 NOTE — Progress Notes (Signed)
Spoke with Sherri from [employer] to refit the pt's TLSO ( [employer], [employer])  Per PT recommendation. Per Sherri they will come and refit the brace.

## 2016-11-06 NOTE — Progress Notes (Signed)
Rehab admissions - I met briefly with patient.  He is too sleepy to engage in conversation.  I have opened his case with [employer] requesting acute inpatient rehab admission.  Not medically ready at this point for rehab.  Will follow progress over the weekend and check back on Monday.  Call me for questions.  #[phone]

## 2016-11-07 DIAGNOSIS — T79A29A Traumatic compartment syndrome of unspecified lower extremity, initial encounter: Secondary | ICD-10-CM

## 2016-11-07 DIAGNOSIS — T79A9XA Traumatic compartment syndrome of other sites, initial encounter: Secondary | ICD-10-CM

## 2016-11-07 DIAGNOSIS — K7040 Alcoholic hepatic failure without coma: Secondary | ICD-10-CM

## 2016-11-07 DIAGNOSIS — M009 Pyogenic arthritis, unspecified: Secondary | ICD-10-CM

## 2016-11-07 LAB — AEROBIC/ANAEROBIC CULTURE W GRAM STAIN (SURGICAL/DEEP WOUND)

## 2016-11-07 LAB — CBC WITH DIFFERENTIAL/PLATELET
BASOS ABS: 0.2 10*3/uL — AB (ref 0.0–0.1)
Basophils Relative: 2 %
EOS ABS: 0.1 10*3/uL (ref 0.0–0.7)
Eosinophils Relative: 1 %
HEMATOCRIT: 29.8 % — AB (ref 39.0–52.0)
Hemoglobin: 9.6 g/dL — ABNORMAL LOW (ref 13.0–17.0)
LYMPHS ABS: 2.4 10*3/uL (ref 0.7–4.0)
Lymphocytes Relative: 25 %
MCH: 33.4 pg (ref 26.0–34.0)
MCHC: 32.2 g/dL (ref 30.0–36.0)
MCV: 103.8 fL — ABNORMAL HIGH (ref 78.0–100.0)
MONO ABS: 1.4 10*3/uL — AB (ref 0.1–1.0)
MONOS PCT: 14 %
Neutro Abs: 5.6 10*3/uL (ref 1.7–7.7)
Neutrophils Relative %: 58 %
Platelets: 142 10*3/uL — ABNORMAL LOW (ref 150–400)
RBC: 2.87 MIL/uL — ABNORMAL LOW (ref 4.22–5.81)
RDW: 19.1 % — AB (ref 11.5–15.5)
WBC: 9.7 10*3/uL (ref 4.0–10.5)

## 2016-11-07 LAB — COMPREHENSIVE METABOLIC PANEL
ALT: 56 U/L (ref 17–63)
AST: 122 U/L — AB (ref 15–41)
Albumin: 1.8 g/dL — ABNORMAL LOW (ref 3.5–5.0)
Alkaline Phosphatase: 101 U/L (ref 38–126)
Anion gap: 8 (ref 5–15)
BILIRUBIN TOTAL: 8.7 mg/dL — AB (ref 0.3–1.2)
BUN: 6 mg/dL (ref 6–20)
CO2: 24 mmol/L (ref 22–32)
Calcium: 8.5 mg/dL — ABNORMAL LOW (ref 8.9–10.3)
Chloride: 107 mmol/L (ref 101–111)
Creatinine, Ser: 0.56 mg/dL — ABNORMAL LOW (ref 0.61–1.24)
GFR calc Af Amer: >60 mL/min (ref >60)
GFR calc non Af Amer: >60 mL/min (ref >60)
Glucose, Bld: 94 mg/dL (ref 65–99)
POTASSIUM: 3.4 mmol/L — AB (ref 3.5–5.1)
Sodium: 139 mmol/L (ref 135–145)
TOTAL PROTEIN: 5.7 g/dL — AB (ref 6.5–8.1)

## 2016-11-07 LAB — HEPATIC FUNCTION PANEL
ALBUMIN: 1.8 g/dL — AB (ref 3.5–5.0)
ALT: 52 U/L (ref 17–63)
AST: 116 U/L — ABNORMAL HIGH (ref 15–41)
Alkaline Phosphatase: 99 U/L (ref 38–126)
BILIRUBIN INDIRECT: 4.6 mg/dL — AB (ref 0.3–0.9)
Bilirubin, Direct: 3.9 mg/dL — ABNORMAL HIGH (ref 0.1–0.5)
TOTAL PROTEIN: 5.9 g/dL — AB (ref 6.5–8.1)
Total Bilirubin: 8.5 mg/dL — ABNORMAL HIGH (ref 0.3–1.2)

## 2016-11-07 LAB — AEROBIC/ANAEROBIC CULTURE (SURGICAL/DEEP WOUND)

## 2016-11-07 LAB — GLUCOSE, CAPILLARY
GLUCOSE-CAPILLARY: 98 mg/dL (ref 65–99)
GLUCOSE-CAPILLARY: 109 mg/dL — AB (ref 65–99)
Glucose-Capillary: 97 mg/dL (ref 65–99)
Glucose-Capillary: 93 mg/dL (ref 65–99)
Glucose-Capillary: 112 mg/dL — ABNORMAL HIGH (ref 65–99)

## 2016-11-07 LAB — PROTIME-INR
INR: 1.98
PROTHROMBIN TIME: 22.8 s — AB (ref 11.4–15.2)

## 2016-11-07 LAB — MAGNESIUM: Magnesium: 1.5 mg/dL — ABNORMAL LOW (ref 1.7–2.4)

## 2016-11-07 LAB — AMMONIA: Ammonia: 53 umol/L — ABNORMAL HIGH (ref 9–35)

## 2016-11-07 MED ORDER — MAGNESIUM SULFATE 2 GM/50ML IV SOLN
2.0000 g | Freq: Once | INTRAVENOUS | Status: DC
  Filled 2016-11-07: qty 50

## 2016-11-07 MED ORDER — HALOPERIDOL LACTATE 5 MG/ML IJ SOLN
5.0000 mg | Freq: Four times a day (QID) | INTRAMUSCULAR | Status: DC | PRN
  Administered 2016-11-07 – 2016-11-08 (×2): 5 mg via INTRAVENOUS
  Filled 2016-11-07 (×2): qty 1

## 2016-11-07 MED ORDER — FOLIC ACID 5 MG/ML IJ SOLN
1.0000 mg | Freq: Every day | INTRAMUSCULAR | Status: DC
  Administered 2016-11-08 – 2016-11-09 (×2): 1 mg via INTRAVENOUS
  Filled 2016-11-07 (×3): qty 0.2

## 2016-11-07 MED ORDER — POTASSIUM CHLORIDE CRYS ER 20 MEQ PO TBCR
50.0000 meq | EXTENDED_RELEASE_TABLET | Freq: Once | ORAL | Status: AC
  Administered 2016-11-07: 19:00:00 50 meq via ORAL
  Filled 2016-11-07: qty 1

## 2016-11-07 MED ORDER — THIAMINE HCL 100 MG/ML IJ SOLN
100.0000 mg | Freq: Every day | INTRAMUSCULAR | Status: DC
  Administered 2016-11-07 – 2016-11-09 (×3): 100 mg via INTRAVENOUS
  Filled 2016-11-07 (×3): qty 2

## 2016-11-07 MED ORDER — LORAZEPAM 2 MG/ML IJ SOLN
2.0000 mg | INTRAMUSCULAR | Status: DC | PRN
  Administered 2016-11-07 (×4): 2 mg via INTRAVENOUS
  Administered 2016-11-07: 1 mg via INTRAVENOUS
  Administered 2016-11-08: 2 mg via INTRAVENOUS
  Administered 2016-11-08: 3 mg via INTRAVENOUS
  Filled 2016-11-07 (×3): qty 1
  Filled 2016-11-07: qty 2
  Filled 2016-11-07 (×2): qty 1

## 2016-11-07 NOTE — Progress Notes (Signed)
Physical Therapy Treatment Patient Details Name: [name] MRN: [id number] DOB: 1981/05/30 Today's Date: 11/07/2016    History of Present Illness 36 yo male developed swelling in Rt leg and foot, and back pain.  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  He was seen by orthopedics, and started on IV abx. 2/04 surgery for decompressive posterior lumbar laminectomy L4-5 and I&D of right thigh abscess  He has hx of Hepatitis C and ETOH.  He was noted to have jaundice and was seen by GI.  He was started on prednisone for alcoholic hepatitis.  He was also seen by ID.  He developed progressive altered mental status due to delirium tremens.  He was transferred to ICU and intubated 2/3; self extubated 2/7.       PT Comments    Pt performed increased mobility during session and able to tolerate mobility to progress gait training.  Pt continues to require +2 assist for bed mobility and transfers, and gait.  Continue to recommend [name] for improved mobility before returning home.     Follow Up Recommendations  [name]     Equipment Recommendations  None recommended by PT    Recommendations for Other Services Rehab consult     Precautions / Restrictions Precautions Precautions: Back Precaution Booklet Issued: No Precaution Comments: verbally reviewed but pt is somewhat confused Restrictions Weight Bearing Restrictions: No    Mobility  Bed Mobility Overal bed mobility: Needs Assistance Bed Mobility: Rolling;Sit to Sidelying;Sidelying to Sit Rolling: Mod assist;+2 for physical assistance Sidelying to sit: Mod assist;+2 for physical assistance     Sit to sidelying: Max assist;+2 for physical assistance General bed mobility comments: max cues with multimodal directions, sequencing and prompts for back precautions  Transfers Overall transfer level: Needs assistance Equipment used: Rolling walker (2 wheeled) Transfers: Sit  to/from Stand Sit to Stand: Mod assist;+2 physical assistance         General transfer comment: cues for hand placement and for directing sequence to sit.  Pt performed x2 trials.    Ambulation/Gait Ambulation/Gait assistance: Mod assist;+2 physical assistance (intially +2 assistance and then progressed to +1 assistance.  ) Ambulation Distance (Feet): 75 Feet Assistive device: Rolling walker (2 wheeled) Gait Pattern/deviations: Step-to pattern;Step-through pattern;Staggering right;Staggering left;Wide base of support;Trunk flexed Gait velocity: reduced   General Gait Details: Able to tighten TLSO better during session.  Pt presents with poor ability to stay inside RW with a strong lateral lean to the L.  Pt required assist to correct and improve weight shifting to RLE.  Pt able to progress stride length and progress to step through sequencing as gait continued.     Stairs            Wheelchair Mobility    Modified Rankin (Stroke Patients Only)       Balance Overall balance assessment: Needs assistance Sitting-balance support: Feet supported;Bilateral upper extremity supported Sitting balance-Leahy Scale: Fair       Standing balance-Leahy Scale: Poor                      Cognition Arousal/Alertness: Awake/alert Behavior During Therapy: WFL for tasks assessed/performed Overall Cognitive Status: Impaired/Different from baseline Area of Impairment: Attention;Following commands;Memory;Safety/judgement;Awareness;Problem solving;Orientation Orientation Level: Situation Current Attention Level: Selective Memory: Decreased recall of precautions;Decreased short-term memory Following Commands: Follows one step commands with increased time;Follows one step commands inconsistently Safety/Judgement: Decreased awareness of safety;Decreased awareness of deficits  Awareness: Intellectual Problem Solving: Slow processing;Difficulty sequencing;Requires verbal cues;Requires  tactile cues General Comments: Pt remains to talk and not make since, but then he has intermittent periods of appearing lucid.      Exercises      General Comments        Pertinent Vitals/Pain Pain Assessment: Faces Faces Pain Scale: Hurts a little bit Pain Location: back RLE, more sore on RLE when sitting due to location.   Pain Descriptors / Indicators: Sore    Home Living                      Prior Function            PT Goals (current goals can now be found in the care plan section) Acute Rehab PT Goals Patient Stated Goal: make progress Potential to Achieve Goals: Good Progress towards PT goals: Progressing toward goals    Frequency    Min 4X/week      PT Plan Current plan remains appropriate    Co-evaluation             End of Session Equipment Utilized During Treatment: Back brace;Gait belt Activity Tolerance: Patient tolerated treatment well;Patient limited by fatigue;Patient limited by lethargy Patient left: in chair;with call bell/phone within reach;with chair alarm set;with nursing/sitter in room;Other (comment)     Time: [id number] PT Time Calculation (min) (ACUTE ONLY): 45 min  Charges:  $Gait Training: 8-22 mins $Therapeutic Activity: 23-37 mins                    G Codes:      Cristela Blue November 17, 2016, 4:29 PM Governor Rooks, PTA pager [phone]

## 2016-11-07 NOTE — Progress Notes (Signed)
Pt noted to be confused with auditory and visual hallucinations  pt thought his mom was in the room and also was asking to "take the box and unscrew it " When there was no box noted in pt reach or sight. Pt in bilat wrist restraint , he was noted trying to come out of restraints and using his legs to scoot down in bed .ciwa scale completed .Pt given 2mg  of ativan . Pt resting now .  All pt needs meet . Will continue to monitor

## 2016-11-07 NOTE — Progress Notes (Signed)
Subjective: 6 Days Post-Op Procedure(s) (LRB): DECOMPRESSIVE POSTERIOR LUMBAR LAMINECTOMY LEVEL 4-5 (N/A) IRRIGATION AND DEBRIDEMENT ABSCESS RIGHT THIGH (Right) Patient reports pain as mild.   Reports pain improving No other c/o  Objective: Vital signs in last 24 hours: Temp:  [98 F (36.7 C)-98.4 F (36.9 C)] 98.2 F (36.8 C) (02/10 0322) Pulse Rate:  [95-105] 100 (02/10 0822) Resp:  [18-20] 18 (02/10 0322) BP: (124-137)/(65-77) 128/77 (02/10 0822) SpO2:  [96 %-100 %] 96 % (02/10 0322) Weight:  [116.1 kg (256 lb)] 116.1 kg (256 lb) (02/10 0322)  Intake/Output from previous day: 02/09 0701 - 02/10 0700 In: 10 [I.V.:10] Out: 700 [Urine:700] Intake/Output this shift: No intake/output data recorded.   Recent Labs  11/05/16 0300 11/06/16 0518  HGB 9.1* 9.7*    Recent Labs  11/05/16 0300 11/06/16 0518  WBC 14.5* 12.2*  RBC 2.74* 2.86*  HCT 28.0* 29.7*  PLT 127* 148*    Recent Labs  11/05/16 0300 11/06/16 0518  NA 141 140  K 3.4* 3.5  CL 109 102  CO2 24 27  BUN 12 7  CREATININE 0.65 0.60*  GLUCOSE 116* 99  CALCIUM 8.9 8.3*    Recent Labs  11/06/16 0518 11/07/16 0754  INR 1.89 1.98    Neurologically intact ABD soft Neurovascular intact Sensation intact distally Intact pulses distally Dorsiflexion/Plantar flexion intact Incision: dressing C/D/I and no drainage No cellulitis present Compartment soft no sign of DVT  Assessment/Plan: 6 Days Post-Op Procedure(s) (LRB): DECOMPRESSIVE POSTERIOR LUMBAR LAMINECTOMY LEVEL 4-5 (N/A) IRRIGATION AND DEBRIDEMENT ABSCESS RIGHT THIGH (Right) Advance diet Up with therapy D/C IV fluids  Mobilization with TLSO  [name], Jose M. 11/07/2016, 9:18 AM

## 2016-11-07 NOTE — Progress Notes (Signed)
Pt mother ( sylvia) , called  And wanted to check on the patient . She stated " matts girlfriend hope , sent a family group text out and said the patient was not being cared for properly during the dayshift , that his bottom was breaking down and she had to bath him because pt was not bathed by staff "  After verfiying mothers info I give her current update on pt status . I explained that pt was resting , and  Does not have breakdown - his bottom area was a bit red as it is close to were he received his surgery . I explained that all measure were being taken to prevent breakdown ( repositioning and skin care ). Pt does not have any open areas . Also , I explained that I am unaware of what happened during the day but informed her that pt was now resting with no signs of agitation and had not had any loose bm, since I arrived on the shift. I did speak to pt significant other ( hope) in person during shift report with Marya Amsler rn present . She insist that I call her in emergency situations( or be the first contact) . She did mention giving the pt a bath - however this was not observed . She never mentioned pt having breakdown or that she felt uncomfortable with his care .  It appears that there may strained  family dynamics between the signifcant other and the pt mother because the mother insist she was first to be contacted and felt the pt girlfriend was oversteping her boundaries to insist otherwise . She stated she had pt chart blocked as she did not want significant other( hope) to make any decision about pt care and/or receive information regarding pt whereabout . She also mentioned hope had the flu and was still coming to see the pt even though she had advised her not to- but that she eventually unblocked chart  . I explained that order of notification and decision making authories would need to be addressed in person . I advised that her and hope visit the facility with pt present to make that distinction with staff  available . She verbalized understanding .

## 2016-11-07 NOTE — Progress Notes (Signed)
Pt significant other called at appx 10.40pm for updates on pt status . I informed her pt was now resting . She asked for a list of pt medication via telephone and I explained I could not  release that info due to [place] laws , she informed me that she know a charge nurse that worked on this unit and would just ask her for pt records , I explained to wife again  that  It would be violating [place] laws for that charge nurse to access the patients records without his consent and/or that nurse not being responsible for his care. She said ok , in turn give me her number as an emegency contact ( Jose "hope" -[phone]) .

## 2016-11-07 NOTE — Progress Notes (Signed)
PROGRESS NOTE    [name]  [id number] DOB: 04-Jan-1981 DOA: 10/29/2016 PCP: Nicholes Rough, PA-C     Brief Narrative:  36 y.o.WM PMHx Hepatitis C, Liver Cirrhosis per wife, last records LFT AST 74, AST 163, Alkaline phosphatase 119, Bili 1.2, EtOH abuse still drinks occasional alcohol, not significant amount per wife, he presents complaining of worsening back pain and right lower extremity edema and redness. He report having back pain since January 19. He was seeing in the ED and discharge on pain regimen. He went to see Dr Suella Broad with ortho and underwent MRI which showed L 4 -5 right facet joint septic arthritis, and soft tissue abscess within the adjacent posterior Paraspinous musculature 1.7* 1.3* 4.6.  He report pain as sharp, shooting pain, radiates to right LE. Is difficult for him to walk due to pain and swelling right LE.  He denies chest pain or dyspnea. No BM in 3 days. His wife just notice that he became jaundice 1 day prior to admission. Also wife reported some confusion early on the day of admission.      Subjective: 2/10 A/O 0 . States pain in RLE but significantly improved since surgery. Also states back pain significantly improved postsurgery. Extremely confused believes he's walking down the street or at work      Assessment & Plan:   Principal Problem:   Epidural abscess Active Problems:   Liver failure (HCC)   Hyponatremia   Cellulitis of right lower extremity   EtOH dependence (HCC)   Thrombocytopenia (HCC)   Cirrhosis ([place])   Paraspinal abscess (HCC)   Chronic hepatitis C without hepatic coma (HCC)   Hepatitis   Abnormal transaminases   Alcoholic hepatitis without ascites   ETOH abuse   Abscess of right lower extremity   Cellulitis and abscess of right lower extremity   Hypernatremia   Delirium tremens ([place])   Infection of lumbar spine (HCC)   Epidural abscess with L4-L5 facet septic arthritis -Findings on The MRI ordered per  patient's orthopedic surgeon. -2/5 S/P I&D epidural abscess -Care per surgery -TLSO brace pending. S2 is brace delivered will contact [name]. -PT/OT recommend [name]  RLE Posterior Compartment Abscesses/Abscess Sciatic/Femoral nerve/ right lower extremity cellulitis -Spoke with Dr. Matt:[facility], who review case and discuss with Dr. Rolena Infante. Decision on how we will treat RLE abscess to be determined. Patient and wife aware of MRI findings and discussion to take place -Continue current antibiotic regimen.  -ADDENDUM: Spoke with Dr Duane Lope D. Brooks. Orthopedic and Spinal surgery who talked  with family today at 55. Surgery scheduled for 2/4 -2/3 transfuse 4 units FFP -2/3 vitamin K IV 10 mg -2/3 ordered 2 units platelets on call for surgery -2/5 S/P I&D abscess  EtOH Acute Hepatic failure -Bilirubin trending up.  Recent Labs Lab 11/03/16 0456 11/03/16 0754 11/04/16 0335 11/05/16 0300 11/06/16 0518  INR 2.00 2.05 2.15 2.00 1.89  -Per gastroenterology likely secondary to EtOH hepatitis in the setting of underlying Hepatitis C. Meld score is  24 with a discriminant function of 66.  -Patient started on prednisone 40 mg daily per GI recommendations:d/c'd due to active infection.   Positive Hepatitis C -Most likely also contributing to patient's liver failure unfortunately -Treatment if any per ID  Elevated Ammonia -Lactulose 30 g BID -Rifaximin 400 mg TID Recent Labs Lab 10/31/16 1920 11/01/16 0338 11/02/16 0153 11/03/16 1241 11/07/16 0909  AMMONIA 42* 63* 51* 66* 53*   ETOH dependence -Continue CIWA protocol.  -Thiamine, folic  acid, multivitamin.  Thrombocytopenia -Likely secondary to ETOH Abuse. Patient with no overt bleeding. Platelet function trending back up. Follow.  Hyponatremia -Slowly improving.  Hypokalemia -K-Dur 50 mEq  Hypomagnesemia -Magnesium IV 2 g     DVT prophylaxis: None: Hypercoagulable Code Status: Full Family  Communication:  Disposition Plan: ??   Consultants:  Dr Duane Lope D. Brooks. Orthopedic and Spinal surgery ID GI   Procedures/Significant Events:  Outside L-spine MRI : showed L 4 -5 right facet joint septic arthritis, and soft tissue abscess within the adjacent posterior Paraspinous musculature 2/3 MRI Right femur/RLE W/WO contrast:-Extensive abscesses involving the posterior compartment of the thigh along with myofasciitis and cellulitis.  -Abscess all along the course of the sciatic/femoral nerve which is thickened and inflamed -Negative Septic arthritis or osteomyelitis. -Cellulitis below the knee but no myofasciitis or pyomyositis. --2/3 transfuse 4 units FFP 2/5 Incision and drainage and irrigation and debridement, RIGHT posterior deep thigh abscess 2/5 L4-5 epidural abscess and facet abscess: Gill decompression, right side, L4-5 with I and D of abscess.    VENTILATOR SETTINGS:    Cultures 2/1 blood 2 NGTD 2/1 MRSA by PCR negative 2/1 Hepatitis C positive     Antimicrobials: Anti-infectives    Start     Stop   10/30/16 1000  ceFEPIme (MAXIPIME) 2 g in dextrose 5 % 50 mL IVPB         10/29/16 2200  vancomycin (VANCOCIN) IVPB 1000 mg/200 mL premix         10/29/16 2200  piperacillin-tazobactam (ZOSYN) IVPB 3.375 g  Status:  Discontinued     10/29/16 1430   10/29/16 1300  vancomycin (VANCOCIN) IVPB 1000 mg/200 mL premix     10/29/16 1519   10/29/16 1300  piperacillin-tazobactam (ZOSYN) IVPB 3.375 g     10/29/16 1340       Devices   LINES / TUBES:      Continuous Infusions: . sodium chloride 75 mL/hr at 11/05/16 2108     Objective: Vitals:   11/06/16 2122 11/06/16 2317 11/07/16 0322 11/07/16 0724  BP: 137/77 124/67 137/75 134/73  Pulse: 95 (!) 103 97 (!) 105  Resp: 18 20 18    Temp: 98.1 F (36.7 C) 98 F (36.7 C) 98.2 F (36.8 C)   TempSrc: Oral Oral Oral   SpO2: 97% 96% 96%   Weight:   116.1 kg (256 lb)   Height:        Intake/Output  Summary (Last 24 hours) at 11/07/16 0804 Last data filed at 11/07/16 0327  Gross per 24 hour  Intake               10 ml  Output              700 ml  Net             -690 ml   Filed Weights   10/31/16 0315 11/05/16 2053 11/07/16 0322  Weight: 120.7 kg (266 lb 1.6 oz) 116.9 kg (257 lb 12.8 oz) 116.1 kg (256 lb)    Examination:  General: A/O 0, believes he is at work or walking on the street. No acute respiratory distress Eyes: negative scleral hemorrhage, negative anisocoria, negative icterus ENT: Negative Runny nose, negative gingival bleeding, Neck:  Negative scars, masses, torticollis, lymphadenopathy, JVD Lungs: Clear to auscultation bilaterally without wheezes or crackles Cardiovascular: Regular rate and rhythm without murmur gallop or rub normal S1 and S2 Abdomen: negative abdominal pain, nondistended, positive soft, bowel sounds, no rebound, no ascites,  no appreciable mass Extremities: RLE , with reduced swelling/erythema. Still some pain to palpation Skin: Negative rashes, lesions, ulcers Psychiatric:  Negative depression, negative anxiety, negative fatigue, negative mania  Central nervous system:  Cranial nerves II through XII intact, tongue/uvula midline, all extremities muscle strength 5/5, sensation intact throughout, negative dysarthria, negative expressive aphasia, negative receptive aphasia.  .     Data Reviewed: Care during the described time interval was provided by me .  I have reviewed this patient's available data, including medical history, events of note, physical examination, and all test results as part of my evaluation. I have personally reviewed and interpreted all radiology studies.  CBC:  Recent Labs Lab 11/01/16 1848 11/02/16 0153 11/03/16 0456 11/04/16 0335 11/05/16 0300 11/06/16 0518  WBC 12.1* 13.5* 9.9 16.1* 14.5* 12.2*  NEUTROABS 8.4*  --   --   --   --   --   HGB 8.7* 8.7* 9.3* 9.0* 9.1* 9.7*  HCT 25.3* 25.3* 28.2* 28.1* 28.0* 29.7*    MCV 96.9 97.3 99.3 101.8* 102.2* 103.8*  PLT 83* 107* 99* 119* 127* [id number]*   Basic Metabolic Panel:  Recent Labs Lab 11/01/16 0338  11/02/16 0153 11/02/16 1628 11/03/16 0456 11/03/16 1700 11/04/16 0335 11/05/16 0300 11/06/16 0518  NA 132*  < > 137  --  138  --  140 141 140  K 4.5  < > 4.2  --  3.5  --  4.2 3.4* 3.5  CL 104  --  107  --  105  --  108 109 102  CO2 25  --  24  --  24  --  23 24 27   GLUCOSE 150*  --  117*  --  109*  --  136* 116* 99  BUN 11  --  19  --  18  --  26* 12 7  CREATININE 0.67  --  0.83  --  0.65  --  0.96 0.65 0.60*  CALCIUM 7.7*  --  7.8*  --  8.3*  --  8.5* 8.9 8.3*  MG 2.0  --   --  2.4 2.1 2.0  --   --   --   PHOS  --   --   --  4.7* 3.6 4.4  --   --   --   < > = values in this interval not displayed. GFR: Estimated Creatinine Clearance: 167 mL/min (by C-G formula based on SCr of 0.6 mg/dL (L)). Liver Function Tests:  Recent Labs Lab 11/02/16 0153 11/03/16 0754 11/04/16 0335 11/05/16 0300 11/06/16 0518  AST 175* 162* 163* 130* 125*  ALT 67* 73* 69* 60 56  ALKPHOS 123 123 116 115 110  BILITOT 11.2* 10.1* 10.3* 10.5* 10.5*  PROT 5.4* 6.4* 5.6* 5.6* 5.8*  ALBUMIN 2.0* 1.8* 1.8* 1.8* 1.8*   No results for input(s): LIPASE, AMYLASE in the last 168 hours.  Recent Labs Lab 10/31/16 1920 11/01/16 0338 11/02/16 0153 11/03/16 1241  AMMONIA 42* 63* 51* 66*   Coagulation Profile:  Recent Labs Lab 11/03/16 0456 11/03/16 0754 11/04/16 0335 11/05/16 0300 11/06/16 0518  INR 2.00 2.05 2.15 2.00 1.89   Cardiac Enzymes: No results for input(s): CKTOTAL, CKMB, CKMBINDEX, TROPONINI in the last 168 hours. BNP (last 3 results) No results for input(s): PROBNP in the last 8760 hours. HbA1C: No results for input(s): HGBA1C in the last 72 hours. CBG:  Recent Labs Lab 11/06/16 1624 11/06/16 2120 11/06/16 2314 11/07/16 0320 11/07/16 0745  GLUCAP 120* 97 116* 98 97  Lipid Profile: No results for input(s): CHOL, HDL, LDLCALC, TRIG,  CHOLHDL, LDLDIRECT in the last 72 hours. Thyroid Function Tests: No results for input(s): TSH, T4TOTAL, FREET4, T3FREE, THYROIDAB in the last 72 hours. Anemia Panel: No results for input(s): VITAMINB12, FOLATE, FERRITIN, TIBC, IRON, RETICCTPCT in the last 72 hours. Sepsis Labs:  Recent Labs Lab 11/01/16 [id number]  LATICACIDVEN 0.9    Recent Results (from the past 240 hour(s))  Culture, blood (routine x 2)     Status: None   Collection Time: 10/29/16 12:52 PM  Result Value Ref Range Status   Specimen Description BLOOD RIGHT ANTECUBITAL  Final   Special Requests BOTTLES DRAWN AEROBIC AND ANAEROBIC 5 ML  Final   Culture   Final    NO GROWTH 5 DAYS Performed at [facility], [address] [place], [place] [address]    Report Status 11/03/2016 FINAL  Final  Culture, blood (routine x 2)     Status: None   Collection Time: 10/29/16 12:56 PM  Result Value Ref Range Status   Specimen Description BLOOD LEFT HAND  Final   Special Requests IN PEDIATRIC BOTTLE 2 ML  Final   Culture   Final    NO GROWTH 5 DAYS Performed at [facility], [place] [address], [place], [place] [address]    Report Status 11/03/2016 FINAL  Final  MRSA PCR Screening     Status: None   Collection Time: 10/29/16  9:30 PM  Result Value Ref Range Status   MRSA by PCR NEGATIVE NEGATIVE Final    Comment:        The GeneXpert MRSA Assay (FDA approved for NASAL specimens only), is one component of a comprehensive MRSA colonization surveillance program. It is not intended to diagnose MRSA infection nor to guide or monitor treatment for MRSA infections.   Aerobic/Anaerobic Culture (surgical/deep wound)     Status: None (Preliminary result)   Collection Time: 11/01/16 11:23 AM  Result Value Ref Range Status   Specimen Description ABSCESS RIGHT THIGH  Final   Special Requests POSTERIOR THIGH  Final   Gram Stain   Final    FEW WBC PRESENT, PREDOMINANTLY PMN RARE GRAM POSITIVE COCCI IN PAIRS    Culture    Final    FEW STREPTOCOCCUS INTERMEDIUS NO ANAEROBES ISOLATED; CULTURE IN PROGRESS FOR 5 DAYS    Report Status PENDING  Incomplete   Organism ID, Bacteria STREPTOCOCCUS INTERMEDIUS  Final      Susceptibility   Streptococcus intermedius - MIC*    PENICILLIN <=0.06 SENSITIVE Sensitive     CEFTRIAXONE <=0.12 SENSITIVE Sensitive     ERYTHROMYCIN <=0.12 SENSITIVE Sensitive     LEVOFLOXACIN 0.5 SENSITIVE Sensitive     VANCOMYCIN 0.5 SENSITIVE Sensitive     * FEW STREPTOCOCCUS INTERMEDIUS  Aerobic/Anaerobic Culture (surgical/deep wound)     Status: None (Preliminary result)   Collection Time: 11/01/16 11:42 AM  Result Value Ref Range Status   Specimen Description ABSCESS RIGHT BACK  Final   Special Requests   Final    POSTERIOR LATERAL LUMBAR L4 L5 ABSCESS POF CEFATIN AND VANC   Gram Stain   Final    MODERATE WBC PRESENT, PREDOMINANTLY PMN NO ORGANISMS SEEN    Culture   Final    FEW STREPTOCOCCUS INTERMEDIUS NO ANAEROBES ISOLATED; CULTURE IN PROGRESS FOR 5 DAYS    Report Status PENDING  Incomplete   Organism ID, Bacteria STREPTOCOCCUS INTERMEDIUS  Final      Susceptibility   Streptococcus  intermedius - MIC*    PENICILLIN <=0.06 SENSITIVE Sensitive     CEFTRIAXONE <=0.12 SENSITIVE Sensitive     ERYTHROMYCIN <=0.12 SENSITIVE Sensitive     LEVOFLOXACIN 0.5 SENSITIVE Sensitive     VANCOMYCIN 0.5 SENSITIVE Sensitive     * FEW STREPTOCOCCUS INTERMEDIUS  Aerobic/Anaerobic Culture (surgical/deep wound)     Status: None (Preliminary result)   Collection Time: 11/01/16 12:01 PM  Result Value Ref Range Status   Specimen Description WOUND BACK  Final   Special Requests LUMBAR L4 L5 FACET SPEC C POF CEFTIN AND VANC  Final   Gram Stain   Final    MODERATE WBC PRESENT, PREDOMINANTLY PMN NO ORGANISMS SEEN    Culture   Final    FEW VIRIDANS STREPTOCOCCUS SUSCEPTIBILITIES TO FOLLOW NO ANAEROBES ISOLATED; CULTURE IN PROGRESS FOR 5 DAYS    Report Status PENDING  Incomplete  Aerobic/Anaerobic  Culture (surgical/deep wound)     Status: None   Collection Time: 11/01/16  1:47 PM  Result Value Ref Range Status   Specimen Description TISSUE  Final   Special Requests   Final    L4 L5 DISC MATERIAL SPECIMEN CUP D PATIENT ON FOLLOWING  CEFTIN AND VANCOMYCIN    Gram Stain   Final    RARE WBC PRESENT, PREDOMINANTLY MONONUCLEAR NO ORGANISMS SEEN    Culture No growth aerobically or anaerobically.  Final   Report Status 11/06/2016 FINAL  Final         Radiology Studies: No results found.      Scheduled Meds: . cefTRIAXone (ROCEPHIN)  IV  2 g Intravenous Q24H  . chlorhexidine  15 mL Mouth Rinse BID  . Chlorhexidine Gluconate Cloth  6 each Topical Daily  . folic acid  1 mg Oral Daily  . lactulose  30 g Oral BID  . LORazepam  2 mg Intravenous Q8H  . mouth rinse  15 mL Mouth Rinse q12n4p  . multivitamin with minerals  1 tablet Oral Daily  . pantoprazole sodium  40 mg Oral Daily  . rifaximin  400 mg Oral Q8H  . risperiDONE  1 mg Oral Q12H  . sodium chloride flush  10-40 mL Intracatheter Q12H  . thiamine  100 mg Oral Daily   Continuous Infusions: . sodium chloride 75 mL/hr at 11/05/16 2108     LOS: 9 days    Time spent:90 min    Ceylin Dreibelbis, Geraldo Docker, MD [facility] Pager [phone]  If 7PM-7AM, please contact night-coverage [email or web address] Password [email or web address] 11/07/2016, 8:04 AM

## 2016-11-07 NOTE — Progress Notes (Signed)
Patient increased with delusions as the shift progressed. Patient not aware of safety limits, with multiple attempts to redirect patient and the utilization of the TELESITTER and medication to help patient relax. Patient had to be placed in bilateral wrist restraints. [facility] notified of patients status, and order received for non violent restraint. Patient continued to be restless and pulling at patient care equipment, but no longer attempting to escape the bed. Restraints appear to help patient remember limitations.

## 2016-11-08 DIAGNOSIS — T79A9XA Traumatic compartment syndrome of other sites, initial encounter: Secondary | ICD-10-CM

## 2016-11-08 LAB — CBC WITH DIFFERENTIAL/PLATELET
BAND NEUTROPHILS: 7 %
BASOS ABS: 0.2 10*3/uL — AB (ref 0.0–0.1)
BASOS PCT: 2 %
Blasts: 0 %
EOS PCT: 2 %
Eosinophils Absolute: 0.2 10*3/uL (ref 0.0–0.7)
HEMATOCRIT: 28.5 % — AB (ref 39.0–52.0)
Hemoglobin: 9.2 g/dL — ABNORMAL LOW (ref 13.0–17.0)
LYMPHS ABS: 2.2 10*3/uL (ref 0.7–4.0)
Lymphocytes Relative: 21 %
MCH: 33.7 pg (ref 26.0–34.0)
MCHC: 32.3 g/dL (ref 30.0–36.0)
MCV: 104.4 fL — ABNORMAL HIGH (ref 78.0–100.0)
METAMYELOCYTES PCT: 0 %
MONOS PCT: 6 %
MYELOCYTES: 0 %
Monocytes Absolute: 0.6 10*3/uL (ref 0.1–1.0)
NEUTROS ABS: 6.8 10*3/uL (ref 1.7–7.7)
Neutrophils Relative %: 58 %
Other: 4 %
PLATELETS: 146 10*3/uL — AB (ref 150–400)
Promyelocytes Absolute: 0 %
RBC: 2.73 MIL/uL — AB (ref 4.22–5.81)
RDW: 19.6 % — AB (ref 11.5–15.5)
WBC: 10.4 10*3/uL (ref 4.0–10.5)
nRBC: 1 /100 WBC — ABNORMAL HIGH

## 2016-11-08 LAB — COMPREHENSIVE METABOLIC PANEL
ALBUMIN: 1.6 g/dL — AB (ref 3.5–5.0)
ALK PHOS: 92 U/L (ref 38–126)
ALT: 48 U/L (ref 17–63)
AST: 105 U/L — ABNORMAL HIGH (ref 15–41)
Anion gap: 7 (ref 5–15)
BILIRUBIN TOTAL: 7.5 mg/dL — AB (ref 0.3–1.2)
CALCIUM: 8.1 mg/dL — AB (ref 8.9–10.3)
CO2: 27 mmol/L (ref 22–32)
Chloride: 106 mmol/L (ref 101–111)
Creatinine, Ser: 0.59 mg/dL — ABNORMAL LOW (ref 0.61–1.24)
GFR calc Af Amer: >60 mL/min (ref >60)
GFR calc non Af Amer: >60 mL/min (ref >60)
GLUCOSE: 89 mg/dL (ref 65–99)
Potassium: 3.2 mmol/L — ABNORMAL LOW (ref 3.5–5.1)
SODIUM: 140 mmol/L (ref 135–145)
TOTAL PROTEIN: 5.5 g/dL — AB (ref 6.5–8.1)

## 2016-11-08 LAB — PROTIME-INR
INR: 1.99
Prothrombin Time: 22.9 seconds — ABNORMAL HIGH (ref 11.4–15.2)

## 2016-11-08 LAB — AMMONIA: Ammonia: 37 umol/L — ABNORMAL HIGH (ref 9–35)

## 2016-11-08 LAB — GLUCOSE, CAPILLARY
GLUCOSE-CAPILLARY: 105 mg/dL — AB (ref 65–99)
Glucose-Capillary: 115 mg/dL — ABNORMAL HIGH (ref 65–99)
Glucose-Capillary: 131 mg/dL — ABNORMAL HIGH (ref 65–99)
Glucose-Capillary: 80 mg/dL (ref 65–99)
Glucose-Capillary: 95 mg/dL (ref 65–99)

## 2016-11-08 LAB — MAGNESIUM: Magnesium: 1.7 mg/dL (ref 1.7–2.4)

## 2016-11-08 MED ORDER — MAGNESIUM SULFATE 2 GM/50ML IV SOLN
2.0000 g | Freq: Once | INTRAVENOUS | Status: AC
  Administered 2016-11-08: 2 g via INTRAVENOUS

## 2016-11-08 MED ORDER — POTASSIUM CHLORIDE CRYS ER 20 MEQ PO TBCR
60.0000 meq | EXTENDED_RELEASE_TABLET | Freq: Once | ORAL | Status: AC
  Administered 2016-11-08: 60 meq via ORAL
  Filled 2016-11-08: qty 3

## 2016-11-08 MED ORDER — FENTANYL BOLUS VIA INFUSION: 30.0000 ug | INTRAVENOUS | Status: DC | PRN

## 2016-11-08 MED ORDER — FENTANYL CITRATE (PF) 100 MCG/2ML IJ SOLN: 30.0000 ug | INTRAMUSCULAR | Status: DC | PRN

## 2016-11-08 MED ORDER — HALOPERIDOL LACTATE 5 MG/ML IJ SOLN
0.5000 mg | Freq: Four times a day (QID) | INTRAMUSCULAR | Status: DC | PRN
  Administered 2016-11-09: 1 mg via INTRAVENOUS
  Administered 2016-11-09: 0.5 mg via INTRAVENOUS
  Administered 2016-11-10: 1 mg via INTRAVENOUS
  Filled 2016-11-08 (×3): qty 1

## 2016-11-08 NOTE — Progress Notes (Signed)
Pt remains a bit restless and agitated  Continuing to monitor pt for ativan effectiveness . Right elbow reddened from pt attempting to push up using albows to get out of restraints

## 2016-11-08 NOTE — Progress Notes (Signed)
PT Cancellation Note  Patient Details Name: [name] MRN: [id number] DOB: 1981-06-11   Cancelled Treatment:    Reason Eval/Treat Not Completed: Medical issues which prohibited therapy.  Patient with agitation, given haldol.  Will return at later date for PT session.   Despina Pole 11/08/2016, 11:55 AM

## 2016-11-08 NOTE — Progress Notes (Signed)
Subjective: 7 Days Post-Op Procedure(s) (LRB): DECOMPRESSIVE POSTERIOR LUMBAR LAMINECTOMY LEVEL 4-5 (N/A) IRRIGATION AND DEBRIDEMENT ABSCESS RIGHT THIGH (Right) Patient reports pain as mild.     Objective: Vital signs in last 24 hours: Temp:  [98.1 F (36.7 C)-98.4 F (36.9 C)] 98.2 F (36.8 C) (02/11 0020) Pulse Rate:  [89-106] 106 (02/11 0300) Resp:  [17-20] 18 (02/11 0020) BP: (124-150)/(65-88) 124/75 (02/11 0300) SpO2:  [93 %-97 %] 95 % (02/11 0020)  Intake/Output from previous day: 02/10 0701 - 02/11 0700 In: 360 [P.O.:360] Out: 850 [Urine:850] Intake/Output this shift: No intake/output data recorded.   Recent Labs  11/06/16 0518 11/07/16 0909  HGB 9.7* 9.6*    Recent Labs  11/06/16 0518 11/07/16 0909  WBC 12.2* 9.7  RBC 2.86* 2.87*  HCT 29.7* 29.8*  PLT 148* 142*    Recent Labs  11/06/16 0518 11/07/16 0909  NA 140 139  K 3.5 3.4*  CL 102 107  CO2 27 24  BUN 7 6  CREATININE 0.60* 0.56*  GLUCOSE 99 94  CALCIUM 8.3* 8.5*    Recent Labs  11/07/16 0754 11/08/16 0417  INR 1.98 1.99    Neurologically intact ABD soft Neurovascular intact Sensation intact distally Intact pulses distally Dorsiflexion/Plantar flexion intact Incision: dressing C/D/I and no drainage No cellulitis present Compartment soft no sign of DVT  Assessment/Plan: 7 Days Post-Op Procedure(s) (LRB): DECOMPRESSIVE POSTERIOR LUMBAR LAMINECTOMY LEVEL 4-5 (N/A) IRRIGATION AND DEBRIDEMENT ABSCESS RIGHT THIGH (Right)  Mobilization with TLSO IV abx per medical teams   Marliyah Reid, Horald Pollen 11/08/2016, 8:00 AM

## 2016-11-08 NOTE — Progress Notes (Signed)
Increased agitation noted . Pt pulled off dressing from posterior right leg . New dressing applied . Pt given 86m ativan.  Soft mitts applied , bed in low position call bell in reach . All pt needs met . Bed changed and pt repositioning noted skin cream applied to buttocks . Bony prominences supported .

## 2016-11-08 NOTE — Progress Notes (Signed)
PROGRESS NOTE    [name]  [id number] DOB: 1981-06-14 DOA: 10/29/2016 PCP: Nicholes Rough, PA-C     Brief Narrative:  36 y.o.WM PMHx Hepatitis C, Liver Cirrhosis per wife, last records LFT AST 74, AST 163, Alkaline phosphatase 119, Bili 1.2, EtOH abuse still drinks occasional alcohol, not significant amount per wife, he presents complaining of worsening back pain and right lower extremity edema and redness. He report having back pain since January 19. He was seeing in the ED and discharge on pain regimen. He went to see Dr Suella Broad with ortho and underwent MRI which showed L 4 -5 right facet joint septic arthritis, and soft tissue abscess within the adjacent posterior Paraspinous musculature 1.7* 1.3* 4.6.  He report pain as sharp, shooting pain, radiates to right LE. Is difficult for him to walk due to pain and swelling right LE.  He denies chest pain or dyspnea. No BM in 3 days. His wife just notice that he became jaundice 1 day prior to admission. Also wife reported some confusion early on the day of admission.      Subjective: 2/11 A/O 0 . Patient extremely confused. Follows commands.      Assessment & Plan:   Principal Problem:   Epidural abscess Active Problems:   Liver failure (HCC)   Hyponatremia   Cellulitis of right lower extremity   EtOH dependence (HCC)   Thrombocytopenia (HCC)   Cirrhosis (HCC)   Paraspinal abscess (HCC)   Chronic hepatitis C without hepatic coma (HCC)   Hepatitis   Abnormal transaminases   Alcoholic hepatitis without ascites   ETOH abuse   Abscess of right lower extremity   Cellulitis and abscess of right lower extremity   Hypernatremia   Delirium tremens (HCC)   Infection of lumbar spine (HCC)   Pyogenic arthritis of multiple sites [facility])   Traumatic compartment syndrome of other sites [facility])   Alcoholic liver failure ([place])   Traumatic compartment syndrome of lower extremity ([place])   Epidural abscess with L4-L5 facet septic  arthritis -Findings on The MRI ordered per patient's orthopedic surgeon. -2/5 S/P I&D epidural abscess -Care per surgery -TLSO brace in room however as long as patient is sedated will be unable to participate in physical therapy. . -PT/OT recommend [name]  RLE Posterior Compartment Abscesses/Abscess Sciatic/Femoral nerve/ right lower extremity cellulitis -Continue current antibiotic regimen.  -2/3 transfuse 4 units FFP -2/3 vitamin K IV 10 mg -2/3 ordered 2 units platelets on call for surgery -2/5 S/P I&D abscess  EtOH Acute Hepatic failure -Bilirubin trending up.   Recent Labs Lab 11/04/16 0335 11/05/16 0300 11/06/16 0518 11/07/16 0754 11/08/16 0417  INR 2.15 2.00 1.89 1.98 1.99  -Per gastroenterology likely secondary to EtOH hepatitis in the setting of underlying Hepatitis C. Meld score is  24 with a discriminant function of 66.  -Patient started on prednisone 40 mg daily per GI recommendations: D/Ced due to active infection.   Positive Hepatitis C -Most likely also contributing to patient's liver failure unfortunately -Treatment if any per ID  Elevated Ammonia -Lactulose 30 g BID -Rifaximin 400 mg TID  Recent Labs Lab 11/02/16 0153 11/03/16 1241 11/07/16 0909  AMMONIA 51* 66* 53*   ETOH dependence -2/11 patient now outside window for withdrawal. DC CIWA protocol.  -Thiamine, folic acid, multivitamin.  Thrombocytopenia -Likely secondary to ETOH Abuse. Patient with no overt bleeding. Platelet function trending back up. Follow.  Hyponatremia -Slowly improving.  Hypokalemia -K-Dur 60 mEq  Hypomagnesemia -Magnesium IV 2 g  Altered mental status -Multifactorial to include iatrogenic (i.e. oversedation on medication), elevated ammonia from liver cirrhosis, infection. -Will minimize all sedating medication -See elevated ammonia    Goals of care -2/11 PALLIATIVE CARE: Now currently unsure who should be making patient's decisions. [place] stated pt brother preston called and spoke to her  stating "pt's significant other  hope is not a wife and his mother should be in charge of making plan of care decisions. CODE STATUS, short-term vs long-term goals care, HCPOA?     DVT prophylaxis: None: Hypercoagulable Code Status: Full Family Communication: None Disposition Plan: PT/OT recommend [name]     Consultants:  Dr Duane Lope D. [place]. Orthopedic and Spinal surgery ID GI   Procedures/Significant Events:  Outside L-spine MRI : showed L 4 -5 right facet joint septic arthritis, and soft tissue abscess within the adjacent posterior Paraspinous musculature 2/3 MRI Right femur/RLE W/WO contrast:-Extensive abscesses involving the posterior compartment of the thigh along with myofasciitis and cellulitis.  -Abscess all along the course of the sciatic/femoral nerve which is thickened and inflamed -Negative Septic arthritis or osteomyelitis. -Cellulitis below the knee but no myofasciitis or pyomyositis. 2/3 transfuse 4 units FFP 2/5 Incision and drainage and irrigation and debridement, RIGHT posterior deep thigh abscess 2/5 L4-5 epidural abscess and facet abscess: Gill decompression, right side, L4-5 with I and D of abscess.    VENTILATOR SETTINGS:    Cultures 2/1 blood 2 NGTD 2/1 MRSA by PCR negative 2/1 Hepatitis C positive     Antimicrobials: Anti-infectives    Start     Stop   10/30/16 1000  ceFEPIme (MAXIPIME) 2 g in dextrose 5 % 50 mL IVPB         10/29/16 2200  vancomycin (VANCOCIN) IVPB 1000 mg/200 mL premix         10/29/16 2200  piperacillin-tazobactam (ZOSYN) IVPB 3.375 g  Status:  Discontinued     10/29/16 1430   10/29/16 1300  vancomycin (VANCOCIN) IVPB 1000 mg/200 mL premix     10/29/16 1519   10/29/16 1300  piperacillin-tazobactam (ZOSYN) IVPB 3.375 g     10/29/16 1340       Devices   LINES / TUBES:      Continuous Infusions: . sodium chloride 75 mL/hr at 11/05/16 2108      Objective: Vitals:   11/08/16 0100 11/08/16 0144 11/08/16 0300 11/08/16 0832  BP: 133/73 128/68 124/75 110/62  Pulse: (!) 101 (!) 105 (!) 106 95  Resp:    18  Temp:    98.5 F (36.9 C)  TempSrc:    Oral  SpO2:    94%  Weight:      Height:        Intake/Output Summary (Last 24 hours) at 11/08/16 0840 Last data filed at 11/08/16 0836  Gross per 24 hour  Intake              240 ml  Output             1700 ml  Net            -1460 ml   Filed Weights   10/31/16 0315 11/05/16 2053 11/07/16 0322  Weight: 120.7 kg (266 lb 1.6 oz) 116.9 kg (257 lb 12.8 oz) 116.1 kg (256 lb)    Examination:  General: A/O 0 . Patient extremely confused. Follows commands. No acute respiratory distress Eyes: negative scleral hemorrhage, negative anisocoria, positive icterus ENT: Negative Runny nose, negative gingival bleeding, Neck:  Negative  scars, masses, torticollis, lymphadenopathy, JVD Lungs: Clear to auscultation bilaterally without wheezes or crackles Cardiovascular: Regular rate and rhythm without murmur gallop or rub normal S1 and S2 Abdomen: negative abdominal pain, nondistended, positive soft, bowel sounds, no rebound, no ascites, no appreciable mass Extremities: RLE , with reduced swelling/erythema. Still some pain to palpation Skin: Positive jaundice Psychiatric:  Negative depression, negative anxiety, negative fatigue, negative mania  Central nervous system:  Cranial nerves II through XII intact, tongue/uvula midline, all extremities muscle strength 5/5, sensation intact throughout, negative dysarthria, negative expressive aphasia, negative receptive aphasia.  .     Data Reviewed: Care during the described time interval was provided by me .  I have reviewed this patient's available data, including medical history, events of note, physical examination, and all test results as part of my evaluation. I have personally reviewed and interpreted all radiology studies.  CBC:  Recent  Labs Lab November 10, 2016 1848  11/03/16 0456 11/04/16 0335 11/05/16 0300 11/06/16 0518 11/07/16 0909  WBC 12.1*  < > 9.9 16.1* 14.5* 12.2* 9.7  NEUTROABS 8.4*  --   --   --   --   --  5.6  HGB 8.7*  < > 9.3* 9.0* 9.1* 9.7* 9.6*  HCT 25.3*  < > 28.2* 28.1* 28.0* 29.7* 29.8*  MCV 96.9  < > 99.3 101.8* 102.2* 103.8* 103.8*  PLT 83*  < > 99* 119* 127* 148* 142*  < > = values in this interval not displayed. Basic Metabolic Panel:  Recent Labs Lab 11/02/16 1628 11/03/16 0456 11/03/16 1700 11/04/16 0335 11/05/16 0300 11/06/16 0518 11/07/16 0909  NA  --  138  --  140 141 140 139  K  --  3.5  --  4.2 3.4* 3.5 3.4*  CL  --  105  --  108 109 102 107  CO2  --  24  --  23 24 27 24   GLUCOSE  --  109*  --  136* 116* 99 94  BUN  --  18  --  26* 12 7 6   CREATININE  --  0.65  --  0.96 0.65 0.60* 0.56*  CALCIUM  --  8.3*  --  8.5* 8.9 8.3* 8.5*  MG 2.4 2.1 2.0  --   --   --  1.5*  PHOS 4.7* 3.6 4.4  --   --   --   --    GFR: Estimated Creatinine Clearance: 167 mL/min (by C-G formula based on SCr of 0.56 mg/dL (L)). Liver Function Tests:  Recent Labs Lab 11/04/16 0335 11/05/16 0300 11/06/16 0518 11/07/16 0754 11/07/16 0909  AST 163* 130* 125* 116* 122*  ALT 69* 60 56 52 56  ALKPHOS 116 115 110 99 101  BILITOT 10.3* 10.5* 10.5* 8.5* 8.7*  PROT 5.6* 5.6* 5.8* 5.9* 5.7*  ALBUMIN 1.8* 1.8* 1.8* 1.8* 1.8*   No results for input(s): LIPASE, AMYLASE in the last 168 hours.  Recent Labs Lab 11/02/16 0153 11/03/16 1241 11/07/16 0909  AMMONIA 51* 66* 53*   Coagulation Profile:  Recent Labs Lab 11/04/16 0335 11/05/16 0300 11/06/16 0518 11/07/16 0754 11/08/16 0417  INR 2.15 2.00 1.89 1.98 1.99   Cardiac Enzymes: No results for input(s): CKTOTAL, CKMB, CKMBINDEX, TROPONINI in the last 168 hours. BNP (last 3 results) No results for input(s): PROBNP in the last 8760 hours. HbA1C: No results for input(s): HGBA1C in the last 72 hours. CBG:  Recent Labs Lab 11/07/16 1109  11/07/16 1632 11/07/16 2028 11/08/16 0018 11/08/16 [id number]  GLUCAP 93 109* 112* 131* 80   Lipid Profile: No results for input(s): CHOL, HDL, LDLCALC, TRIG, CHOLHDL, LDLDIRECT in the last 72 hours. Thyroid Function Tests: No results for input(s): TSH, T4TOTAL, FREET4, T3FREE, THYROIDAB in the last 72 hours. Anemia Panel: No results for input(s): VITAMINB12, FOLATE, FERRITIN, TIBC, IRON, RETICCTPCT in the last 72 hours. Sepsis Labs: No results for input(s): PROCALCITON, LATICACIDVEN in the last 168 hours.  Recent Results (from the past 240 hour(s))  Culture, blood (routine x 2)     Status: None   Collection Time: 10/29/16 12:52 PM  Result Value Ref Range Status   Specimen Description BLOOD RIGHT ANTECUBITAL  Final   Special Requests BOTTLES DRAWN AEROBIC AND ANAEROBIC 5 ML  Final   Culture   Final    NO GROWTH 5 DAYS Performed at [facility], [address] [place], [place] [address]    Report Status 11/03/2016 FINAL  Final  Culture, blood (routine x 2)     Status: None   Collection Time: 10/29/16 12:56 PM  Result Value Ref Range Status   Specimen Description BLOOD LEFT HAND  Final   Special Requests IN PEDIATRIC BOTTLE 2 ML  Final   Culture   Final    NO GROWTH 5 DAYS Performed at [facility], [place] [address] [place],  [address]    Report Status 11/03/2016 FINAL  Final  MRSA PCR Screening     Status: None   Collection Time: 10/29/16  9:30 PM  Result Value Ref Range Status   MRSA by PCR NEGATIVE NEGATIVE Final    Comment:        The GeneXpert MRSA Assay (FDA approved for NASAL specimens only), is one component of a comprehensive MRSA colonization surveillance program. It is not intended to diagnose MRSA infection nor to guide or monitor treatment for MRSA infections.   Aerobic/Anaerobic Culture (surgical/deep wound)     Status: None   Collection Time: 11/01/16 11:23 AM  Result Value Ref Range Status   Specimen Description ABSCESS RIGHT THIGH   Final   Special Requests POSTERIOR THIGH  Final   Gram Stain   Final    FEW WBC PRESENT, PREDOMINANTLY PMN RARE GRAM POSITIVE COCCI IN PAIRS    Culture   Final    FEW STREPTOCOCCUS INTERMEDIUS NO ANAEROBES ISOLATED    Report Status 11/07/2016 FINAL  Final   Organism ID, Bacteria STREPTOCOCCUS INTERMEDIUS  Final      Susceptibility   Streptococcus intermedius - MIC*    PENICILLIN <=0.06 SENSITIVE Sensitive     CEFTRIAXONE <=0.12 SENSITIVE Sensitive     ERYTHROMYCIN <=0.12 SENSITIVE Sensitive     LEVOFLOXACIN 0.5 SENSITIVE Sensitive     VANCOMYCIN 0.5 SENSITIVE Sensitive     * FEW STREPTOCOCCUS INTERMEDIUS  Aerobic/Anaerobic Culture (surgical/deep wound)     Status: None   Collection Time: 11/01/16 11:42 AM  Result Value Ref Range Status   Specimen Description ABSCESS RIGHT BACK  Final   Special Requests   Final    POSTERIOR LATERAL LUMBAR L4 L5 ABSCESS POF CEFATIN AND VANC   Gram Stain   Final    MODERATE WBC PRESENT, PREDOMINANTLY PMN NO ORGANISMS SEEN    Culture   Final    FEW STREPTOCOCCUS INTERMEDIUS NO ANAEROBES ISOLATED    Report Status 11/07/2016 FINAL  Final   Organism ID, Bacteria STREPTOCOCCUS INTERMEDIUS  Final      Susceptibility   Streptococcus intermedius - MIC*    PENICILLIN <=  0.06 SENSITIVE Sensitive     CEFTRIAXONE <=0.12 SENSITIVE Sensitive     ERYTHROMYCIN <=0.12 SENSITIVE Sensitive     LEVOFLOXACIN 0.5 SENSITIVE Sensitive     VANCOMYCIN 0.5 SENSITIVE Sensitive     * FEW STREPTOCOCCUS INTERMEDIUS  Aerobic/Anaerobic Culture (surgical/deep wound)     Status: None   Collection Time: 11/01/16 12:01 PM  Result Value Ref Range Status   Specimen Description WOUND BACK  Final   Special Requests LUMBAR L4 L5 FACET SPEC C POF CEFTIN AND VANC  Final   Gram Stain   Final    MODERATE WBC PRESENT, PREDOMINANTLY PMN NO ORGANISMS SEEN    Culture   Final    FEW STREPTOCOCCUS INTERMEDIUS NO ANAEROBES ISOLATED    Report Status 11/07/2016 FINAL  Final    Organism ID, Bacteria STREPTOCOCCUS INTERMEDIUS  Final      Susceptibility   Streptococcus intermedius - MIC*    PENICILLIN <=0.06 SENSITIVE Sensitive     CEFTRIAXONE <=0.12 SENSITIVE Sensitive     ERYTHROMYCIN <=0.12 SENSITIVE Sensitive     LEVOFLOXACIN 0.5 SENSITIVE Sensitive     VANCOMYCIN 0.5 SENSITIVE Sensitive     * FEW STREPTOCOCCUS INTERMEDIUS  Aerobic/Anaerobic Culture (surgical/deep wound)     Status: None   Collection Time: 11/01/16  1:47 PM  Result Value Ref Range Status   Specimen Description TISSUE  Final   Special Requests   Final    L4 L5 DISC MATERIAL SPECIMEN CUP D PATIENT ON FOLLOWING  CEFTIN AND VANCOMYCIN    Gram Stain   Final    RARE WBC PRESENT, PREDOMINANTLY MONONUCLEAR NO ORGANISMS SEEN    Culture No growth aerobically or anaerobically.  Final   Report Status 11/06/2016 FINAL  Final         Radiology Studies: No results found.      Scheduled Meds: . cefTRIAXone (ROCEPHIN)  IV  2 g Intravenous Q24H  . chlorhexidine  15 mL Mouth Rinse BID  . Chlorhexidine Gluconate Cloth  6 each Topical Daily  . folic acid  1 mg Intravenous Daily  . lactulose  30 g Oral BID  . magnesium sulfate 1 - 4 g bolus IVPB  2 g Intravenous Once  . mouth rinse  15 mL Mouth Rinse q12n4p  . multivitamin with minerals  1 tablet Oral Daily  . pantoprazole sodium  40 mg Oral Daily  . rifaximin  400 mg Oral Q8H  . risperiDONE  1 mg Oral Q12H  . sodium chloride flush  10-40 mL Intracatheter Q12H  . thiamine  100 mg Intravenous Daily   Continuous Infusions: . sodium chloride 75 mL/hr at 11/05/16 2108     LOS: 10 days    Time spent:90 min    Deserea Bordley, Geraldo Docker, MD [facility] Pager [phone]  If 7PM-7AM, please contact night-coverage [email or web address] Password [email or web address] 11/08/2016, 8:40 AM

## 2016-11-08 NOTE — Progress Notes (Signed)
Extreme agitation, pt attempting to try to push out of restraints . Restless . Given haldol . See ciwa scale .

## 2016-11-08 NOTE — Progress Notes (Signed)
Charge Nurse [place] stated pt brother preston called and spoke to her  stating "pt's significant other  hope is not a wife and his mother should be in charge of making plan of care decisions ".   There is no power of attorney on file and I explained to charge nurse that I would not continue to engage with family members regarding  Decision making authories . All relatives would need to come in person with pt present ( to express his wishes regarding his care and who he wants to be his representative) to make an official change or present [place] of attorney documents. Ethical concerns may be involved that I am unaware of AND I don't feel comfortable engaging with family via telephone in this regard.

## 2016-11-09 LAB — PROTIME-INR
INR: 1.93
Prothrombin Time: 22.4 seconds — ABNORMAL HIGH (ref 11.4–15.2)

## 2016-11-09 LAB — CBC
HCT: 28.3 % — ABNORMAL LOW (ref 39.0–52.0)
HEMOGLOBIN: 9 g/dL — AB (ref 13.0–17.0)
MCH: 33.6 pg (ref 26.0–34.0)
MCHC: 31.8 g/dL (ref 30.0–36.0)
MCV: 105.6 fL — AB (ref 78.0–100.0)
PLATELETS: 132 10*3/uL — AB (ref 150–400)
RBC: 2.68 MIL/uL — AB (ref 4.22–5.81)
RDW: 18.9 % — ABNORMAL HIGH (ref 11.5–15.5)
WBC: 11.4 10*3/uL — AB (ref 4.0–10.5)

## 2016-11-09 LAB — COMPREHENSIVE METABOLIC PANEL
ALK PHOS: 92 U/L (ref 38–126)
ALT: 51 U/L (ref 17–63)
AST: 115 U/L — AB (ref 15–41)
Albumin: 1.8 g/dL — ABNORMAL LOW (ref 3.5–5.0)
Anion gap: 5 (ref 5–15)
CALCIUM: 8 mg/dL — AB (ref 8.9–10.3)
CHLORIDE: 109 mmol/L (ref 101–111)
CO2: 24 mmol/L (ref 22–32)
CREATININE: 0.68 mg/dL (ref 0.61–1.24)
Glucose, Bld: 104 mg/dL — ABNORMAL HIGH (ref 65–99)
Potassium: 3.6 mmol/L (ref 3.5–5.1)
SODIUM: 138 mmol/L (ref 135–145)
Total Bilirubin: 6.2 mg/dL — ABNORMAL HIGH (ref 0.3–1.2)
Total Protein: 6.1 g/dL — ABNORMAL LOW (ref 6.5–8.1)

## 2016-11-09 LAB — GLUCOSE, CAPILLARY
GLUCOSE-CAPILLARY: 83 mg/dL (ref 65–99)
Glucose-Capillary: 124 mg/dL — ABNORMAL HIGH (ref 65–99)
Glucose-Capillary: 105 mg/dL — ABNORMAL HIGH (ref 65–99)

## 2016-11-09 LAB — MAGNESIUM: Magnesium: 1.7 mg/dL (ref 1.7–2.4)

## 2016-11-09 MED ORDER — PREMIER PROTEIN SHAKE
11.0000 [oz_av] | Freq: Two times a day (BID) | ORAL | Status: DC
  Administered 2016-11-10 – 2016-11-11 (×3): 11 [oz_av] via ORAL
  Filled 2016-11-09 (×9): qty 325.31

## 2016-11-09 MED ORDER — VITAMIN B-1 100 MG PO TABS
100.0000 mg | ORAL_TABLET | Freq: Every day | ORAL | Status: DC
  Administered 2016-11-10 – 2016-11-12 (×3): 100 mg via ORAL
  Filled 2016-11-09 (×3): qty 1

## 2016-11-09 MED ORDER — FOLIC ACID 1 MG PO TABS
1.0000 mg | ORAL_TABLET | Freq: Every day | ORAL | Status: DC
  Administered 2016-11-10 – 2016-11-12 (×3): 1 mg via ORAL
  Filled 2016-11-09 (×3): qty 1

## 2016-11-09 NOTE — Clinical Social Work Note (Signed)
CSW continues to follow for discharge needs. CSW will provide bed offers to patient and family tomorrow for [name] in case [name] is not a viable discharge option for the patient.   Liz Beach MSW, [place], [place], [id number]

## 2016-11-09 NOTE — Progress Notes (Signed)
Palliative Medicine consult noted. Due to high referral volume, there may be a delay seeing this patient. Please call the Palliative Medicine Team office at [phone] if recommendations are needed in the interim.  Thank you for inviting [place] to see this patient.  Marjie Skiff Henrick Mcgue, RN, BSN, [facility] 11/09/2016 2:45 PM Cell [phone] 8:00-4:00 Monday-Friday Office [phone]

## 2016-11-09 NOTE — Progress Notes (Addendum)
Palliative Medicine RN Note: Reviewed chart to establish decision maker. It is documented at admission that [name] is his girlfriend, not his wife. Legally, the hierarchy of decision makers for patients unable to consent to treatment is as follows:  1. Legal guardian 2. HCPOA 3. Attorney-in-fact 4. Legally married spouse 5. Majority of patient's reasonably available PARENTS AND CHILDREN over 36 years old 58. Majority of patient's reasonably available siblings 77. An individual who has an established relationship with the patient who is acting in good faith  Unless Ms. [name] can produce a marriage license or HCPOA signed by the patient when he had capacity to make decisions, the legal decision maker is the patient's mother. PMT recommends that both mother and girlfriend be present at meetings if at all possible.   PMT will further evaluate case and set up family meeting when there is an available provider.  Marjie Skiff Danyell Shader, RN, BSN, [facility] 11/09/2016 9:29 AM Cell [phone] 8:00-4:00 Monday-Friday Office [phone]

## 2016-11-09 NOTE — Progress Notes (Signed)
Speech Language Pathology Treatment: Dysphagia  Patient Details Name: [name] MRN: [id number] DOB: 1981/01/15 Today's Date: 11/09/2016 Time: [id number] SLP Time Calculation (min) (ACUTE ONLY): 10 min  Assessment / Plan / Recommendation Clinical Impression  Pt remains significantly confused.  However, he is tolerating an oral diet well with clear breath sounds, no overt s/s of aspiration.  Needs assist with tray set-up given degree of confusion, but swallowing deficits have resolved.  No further SLP f/u for dysphagia is warranted.  Our services will sign off.    HPI HPI: 36 yo male developed swelling in Rt leg and foot, and back pain. 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 He was seen by orthopedics, and started on IV abx. 2/04 surgery for decompressive posterior lumbar laminectomy L4-5 and I&D of right thigh abscess He has hx of Hepatitis C and ETOH. He was noted to have jaundice and was seen by GI. He was started on prednisone for alcoholic hepatitis. He was also seen by ID. He developed progressive altered mental status due to delirium tremens. He was transferred to ICU and intubated 2/3; self extubated 2/7.        SLP Plan  Discharge SLP treatment due to (comment)     Recommendations  Diet recommendations: Regular;Thin liquid Liquids provided via: Straw Medication Administration: Whole meds with puree Supervision: Patient able to self feed (assist with tray set up) Compensations: Minimize environmental distractions                Oral Care Recommendations: Oral care BID Plan: Discharge SLP treatment due to (comment)       GO                [name] [name] 11/09/2016, 10:47 AM

## 2016-11-09 NOTE — Progress Notes (Signed)
OT Cancellation Note  Patient Details Name: [name] MRN: [id number] DOB: 01-30-1981   Cancelled Treatment:    Reason Eval/Treat Not Completed: Other (comment). Pt with increase confusion and agitation this afternoon; Haldol given by RN. Will check back later today a sable or tomorrow  [name] 11/09/2016, 1:46 PM

## 2016-11-09 NOTE — Progress Notes (Signed)
Nutrition Follow-up  DOCUMENTATION CODES:   Obesity unspecified  INTERVENTION:   Premier Protein BID, each supplement provides 160kcal and 30g protein.   NUTRITION DIAGNOSIS:   Inadequate oral intake related to lethargy/confusion, encephalopathy as evidenced by per patient/family reports pt eating <75% meals.  GOAL:   Patient will meet greater than or equal to 90% of their needs  MONITOR:   PO intake, Supplement acceptance, Skin, Labs, I & O's  ASSESSMENT:   36 yo male developed swelling in Rt leg and foot, and back pain.  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  He was seen by orthopedics, and started on IV abx. 2/04 surgery for decompressive posterior lumbar laminectomy L4-5 and I&D of right thigh abscess  He has hx of Hepatitis C and ETOH.  He was noted to have jaundice and was seen by GI.  He was started on prednisone for alcoholic hepatitis.  He was also seen by ID.  He developed progressive altered mental status due to delirium tremens.  He was transferred to ICU and intubated 2/3; self extubated 2/7.    Met with pt in room today. Pt still confused. Reports that he has been eating dinner at home and then coming back to the hospital during the day. Pt self extubated on 2/7. Pt failed SLP evaluation on 2/7 and was NPO for two days. Pt had SLP evaluation and FEES on 2/9 and was approved for a regular diet with thins. Pt currently eating <75% meals in hospital. Per chart, Pt has lost 12lbs(5%) since admit. This is significant given the time period. Pt on MVI, RD will order Premier Protein to help pt meet estimated protein needs. Pt with hypokalemia today. Continue to montior and supplement as needed per MD.   Medications reviewed and include: ceftriaxone, folic acid, lactulose, MVI, protonix, thiamine   Labs reviewed: K 3.2(L), BUN <5(L), creat 0.59(L), Ca 8.1(L) adj. 10.02 wnl, Alb 1.6(L), AST 105(H), ammonia  37(H), tbili 7.5(H) Hgb 9.6(L), Hct 29.8(L)  Diet Order:  Diet regular Room service appropriate? Yes; Fluid consistency: Thin  Skin:  Wound (see comment) (incision leg and back)  Last BM:  2/11  Height:   Ht Readings from Last 1 Encounters:  10/29/16 '5\' 11"'  (1.803 m)    Weight:   Wt Readings from Last 1 Encounters:  11/09/16 252 lb (114.3 kg)    Ideal Body Weight:  78.2 kg  BMI:  Body mass index is 35.15 kg/m.  Estimated Nutritional Needs:   Kcal:  2000-2300kcal/day   Protein:  156g/day   Fluid:  >2L/day   EDUCATION NEEDS:   Education needs no appropriate at this time  Koleen Distance, RD, [place] Pager #[phone]

## 2016-11-09 NOTE — Progress Notes (Addendum)
PROGRESS NOTE  [name]  [id number] DOB: 1981/05/22  DOA: 10/29/2016 PCP: Nicholes Rough, PA-C   Brief Narrative:  36 year old male with a PMH of chronic hepatitis C, alcoholic hepatitis, fatty liver, ongoing occasional alcohol intake, remote IVDA, seen by OP pain MD for increasing back pain, right lower extremity cellulitis and edema, ruled out DVT, presented back to provide her when stat MRI confirmed L4-5 septic right facet arthritis and abscess within the paraspinal musculature. Due to altered mental status from delirium tremens, he had to be transferred to ICU, intubated and was under CCM care. Orthopedics, [facility], infectious disease assisted with management. He underwent back and right lower extremity surgery. Extubated and stabilized. Care transferred over to [facility] on 11/06/16.   Assessment & Plan:   Principal Problem:   Epidural abscess Active Problems:   Liver failure (HCC)   Hyponatremia   Cellulitis of right lower extremity   EtOH dependence (HCC)   Thrombocytopenia (HCC)   Cirrhosis (HCC)   Paraspinal abscess (HCC)   Chronic hepatitis C without hepatic coma (HCC)   Hepatitis   Abnormal transaminases   Alcoholic hepatitis without ascites   ETOH abuse   Abscess of right lower extremity   Cellulitis and abscess of right lower extremity   Hypernatremia   Delirium tremens (HCC)   Infection of lumbar spine (HCC)   Pyogenic arthritis of multiple sites [facility])   Traumatic compartment syndrome of other sites [facility])   Alcoholic liver failure ([place])   Traumatic compartment syndrome of lower extremity (HCC)   Epidural abscess with L4-L5 facet septic arthritis:  Orthopedics and infectious disease were consulted. He was empirically started on IV vancomycin and Zosyn was changed to cefepime. Orthopedics wanted to operate but were concerned regarding anesthesia risks given acute hepatic failure and requested GI input. GI indicated that he will be high risk. However it was  determined that without definitive treatment, he will most likely continue to deteriorate. He continued to have worsening pain. After discussing risks and benefits with family, orthopedics proceeded with surgery. He was intubated for airway protection due to DTs. He was given vitamin K, FFP and platelets prior to surgery. He underwent L4-5 decompressive posterior lumbar laminectomy along with I&D of thigh abscess on 11/01/16. Post surgery, antibiotics were changed to vancomycin and ceftriaxone. Drains were removed. Doppler of leg on 10/26/16 negative for thrombus. Cultures then grew streptococci viridans and Streptococcus intermedius. Antibiotics were narrowed to IV ceftriaxone on 11/04/16. ID recommended 6 weeks of IV ceftriaxone in a completely supervised setting given prior history of IVDA. Please refer to Dr. Tommy Medal, ID note off 11/05/16 for details of antibiotics, duration and lab monitoring. Rocephin end date 12/19/16. Discussed with ID and will place PICC line. Post surgery orthopedics recommended mobilization with PT, upper therapy and TLSO brace. PT and OT evaluated and recommended [name].  Right posterior deep thigh abscess/pyomyositis:  Status post I&D by orthopedics on 11/01/16. Rest of management as indicated above.  Acute hepatic failure/Jaundice/hepatitis C/fatty liver:  History of hepatitis C and fatty liver. History of severe alcoholic hepatitis [id number]. Never followed through with planned treatment with [facility] after failing some unspecified antiviral in 2016. MELD=24 & discriminant function 66 consistent with recurrent alcoholic hepatitis. GI was consulted. Patient was started on prednisone 40 mg daily 30 days which was switched to IV Solu-Medrol while he was intubated, for alcoholic hepatitis. Steroids were then stopped on 11/02/16 due to ongoing infection. As per GI, alcoholic hepatitis typically takes 1-3 months to resolve and  he is at increased risk for hepatic decompensation, morbidity and mortality  from this and this risk will not likely change for 1-2 months. Avoid alcohol completely and acetaminophen. He was also placed on lactulose and rifaximin. LFTs improving. He has to follow up with his primary GI doctor (Dr. Alice Reichert) in 3-4 weeks from discharge.  Alcohol abuse and likely dependence:  On 10/31/16, patient developed progressive altered mental status due to delirium tremens and he was transferred to ICU. He was started on Precedex drip and multivitamins.  Coagulopathy Secondary to acute liver failure. No bleeding reported.  Thrombocytopenia Secondary to liver disease. Improved and stable.  Hyponatremia Resolved.  Acute hypoxic respiratory failure secondary to unable to protect airways due to DTs:  Transfer to ICU. Intubated 11/01/16. CCM managed. Patient self extubated on 11/04/16. Maintaining airway.  Acute encephalopathy secondary to delirium tremens/hepatic encephalopathy:  Remains on rifaximin and lactulose. [facility] assisted with management. DTs improved but patient remained confused. His mental status changes were felt to be multifactorial secondary to medications in addition to hepatic encephalopathy and ongoing infections. Sedative medications were minimized. Ammonia has normalized. Remains on risperidone 1 MG twice a day. Sleep hygiene. Continue to monitor closely. If status does not start to improve soon, consider psychiatric consultation.   Hypoglycemia: Resolved. Speech therapy evaluated and recommended a regular consistency diet.  Hypokalemia and hypomagnesemia - Replace and follow as needed.  Anemia - Stable.  Goals of care:  As per chart review, patient's brother indicated that patient's significant other is not his wife and patient's mother should be in charge of making healthcare decisions, CODE STATUS and short-term versus long-term goals care. PMT's onboard to further evaluate case and assist.   DVT prophylaxis: SCDs Code Status: Full Family Communication:  None at bedside Disposition Plan: [name] when medically stable. Currently in stepdown status.   Consultants:   Orthopedics  [facility]  Infectious disease  CCM  Rehabilitation M.D.  Procedures:   Intubated for mechanical ventilation 11/01/16  Central line 11/01/16-Discontinued   I&D of right posterior deep deep thigh abscess by orthopedics on 11/01/16  Decompressive posterior lumbar laminectomy level IV-V by orthopedics on 11/01/16  NG tube feeding-discontinued.  Arterial line-discontinued  Foley catheter  Antimicrobials:   IV Rocephin  Rifaximin     Subjective: Seen this morning. Pleasantly confused. On bilateral wrist restraints and mittens. Subsequently updated by RN that patient has been eating and drinking well, restraints removed but still confused.  Objective:  Vitals:   11/09/16 0041 11/09/16 0257 11/09/16 0420 11/09/16 0913  BP: 135/70 (!) 141/78 120/67 117/67  Pulse: 99   95  Resp: 19  16 18   Temp: 98.4 F (36.9 C)  98 F (36.7 C) 97.8 F (36.6 C)  TempSrc:   Oral Oral  SpO2: 99%  99% 100%  Weight:   114.3 kg (252 lb)   Height:        Intake/Output Summary (Last 24 hours) at 11/09/16 1230 Last data filed at 11/09/16 1000  Gross per 24 hour  Intake             6720 ml  Output             3800 ml  Net             2920 ml   Filed Weights   11/05/16 2053 11/07/16 0322 11/09/16 0420  Weight: 116.9 kg (257 lb 12.8 oz) 116.1 kg (256 lb) 114.3 kg (252 lb)    Examination:  General  exam: Pleasant young male lying propped up in bed. Appears comfortable but fidgety at times. Scleral icterus. Respiratory system: Clear to auscultation. Respiratory effort normal. Cardiovascular system: S1 & S2 heard, RRR. No JVD, murmurs, rubs, gallops or clicks. No pedal edema. Telemetry: SR-ST in the 100s. Gastrointestinal system: Abdomen is nondistended, soft and nontender. No organomegaly or masses felt. Normal bowel sounds heard. Foley catheter +. Central nervous  system: Alert and oriented only to self. No focal neurological deficits. Extremities: Symmetric 5 x 5 power. Bilateral wrist restraints and mittens. Skin: No rashes, lesions or ulcers. Lumbar spine surgical site and right posterior thigh site dressing clean and dry. Psychiatry: Judgement and insight impaired. Mood & affect appropriate.     Data Reviewed: I have personally reviewed following labs and imaging studies  CBC:  Recent Labs Lab 11/04/16 0335 11/05/16 0300 11/06/16 0518 11/07/16 0909 11/08/16 0849  WBC 16.1* 14.5* 12.2* 9.7 10.4  NEUTROABS  --   --   --  5.6 6.8  HGB 9.0* 9.1* 9.7* 9.6* 9.2*  HCT 28.1* 28.0* 29.7* 29.8* 28.5*  MCV 101.8* 102.2* 103.8* 103.8* 104.4*  PLT 119* 127* 148* 142* [id number]*   Basic Metabolic Panel:  Recent Labs Lab 11/02/16 1628  11/03/16 0456 11/03/16 1700 11/04/16 0335 11/05/16 0300 11/06/16 0518 11/07/16 0909 11/08/16 0849  NA  --   < > 138  --  140 141 140 139 140  K  --   < > 3.5  --  4.2 3.4* 3.5 3.4* 3.2*  CL  --   < > 105  --  108 109 102 107 106  CO2  --   < > 24  --  23 24 27 24 27   GLUCOSE  --   < > 109*  --  136* 116* 99 94 89  BUN  --   < > 18  --  26* 12 7 6  <5*  CREATININE  --   < > 0.65  --  0.96 0.65 0.60* 0.56* 0.59*  CALCIUM  --   < > 8.3*  --  8.5* 8.9 8.3* 8.5* 8.1*  MG 2.4  --  2.1 2.0  --   --   --  1.5* 1.7  PHOS 4.7*  --  3.6 4.4  --   --   --   --   --   < > = values in this interval not displayed. GFR: Estimated Creatinine Clearance: 165.7 mL/min (by C-G formula based on SCr of 0.59 mg/dL (L)). Liver Function Tests:  Recent Labs Lab 11/05/16 0300 11/06/16 0518 11/07/16 0754 11/07/16 0909 11/08/16 0849  AST 130* 125* 116* 122* 105*  ALT 60 56 52 56 48  ALKPHOS 115 110 99 101 92  BILITOT 10.5* 10.5* 8.5* 8.7* 7.5*  PROT 5.6* 5.8* 5.9* 5.7* 5.5*  ALBUMIN 1.8* 1.8* 1.8* 1.8* 1.6*   No results for input(s): LIPASE, AMYLASE in the last 168 hours.  Recent Labs Lab 11/03/16 1241 11/07/16 0909  11/08/16 0849  AMMONIA 66* 53* 37*   Coagulation Profile:  Recent Labs Lab 11/04/16 0335 11/05/16 0300 11/06/16 0518 11/07/16 0754 11/08/16 0417  INR 2.15 2.00 1.89 1.98 1.99   Cardiac Enzymes: No results for input(s): CKTOTAL, CKMB, CKMBINDEX, TROPONINI in the last 168 hours. BNP (last 3 results) No results for input(s): PROBNP in the last 8760 hours. HbA1C: No results for input(s): HGBA1C in the last 72 hours. CBG:  Recent Labs Lab 11/08/16 1655 11/08/16 1955 11/09/16 0044 11/09/16 0911 11/09/16 1125  GLUCAP 105* 95 83 124* 105*   Lipid Profile: No results for input(s): CHOL, HDL, LDLCALC, TRIG, CHOLHDL, LDLDIRECT in the last 72 hours. Thyroid Function Tests: No results for input(s): TSH, T4TOTAL, FREET4, T3FREE, THYROIDAB in the last 72 hours. Anemia Panel: No results for input(s): VITAMINB12, FOLATE, FERRITIN, TIBC, IRON, RETICCTPCT in the last 72 hours.  Sepsis Labs:  Recent Labs Lab 11/05/16 0300 11/06/16 0518 11/07/16 0909 11/08/16 0849  WBC 14.5* 12.2* 9.7 10.4    Recent Results (from the past 240 hour(s))  Aerobic/Anaerobic Culture (surgical/deep wound)     Status: None   Collection Time: 11/01/16 11:23 AM  Result Value Ref Range Status   Specimen Description ABSCESS RIGHT THIGH  Final   Special Requests POSTERIOR THIGH  Final   Gram Stain   Final    FEW WBC PRESENT, PREDOMINANTLY PMN RARE GRAM POSITIVE COCCI IN PAIRS    Culture   Final    FEW STREPTOCOCCUS INTERMEDIUS NO ANAEROBES ISOLATED    Report Status 11/07/2016 FINAL  Final   Organism ID, Bacteria STREPTOCOCCUS INTERMEDIUS  Final      Susceptibility   Streptococcus intermedius - MIC*    PENICILLIN <=0.06 SENSITIVE Sensitive     CEFTRIAXONE <=0.12 SENSITIVE Sensitive     ERYTHROMYCIN <=0.12 SENSITIVE Sensitive     LEVOFLOXACIN 0.5 SENSITIVE Sensitive     VANCOMYCIN 0.5 SENSITIVE Sensitive     * FEW STREPTOCOCCUS INTERMEDIUS  Aerobic/Anaerobic Culture (surgical/deep wound)      Status: None   Collection Time: 11/01/16 11:42 AM  Result Value Ref Range Status   Specimen Description ABSCESS RIGHT BACK  Final   Special Requests   Final    POSTERIOR LATERAL LUMBAR L4 L5 ABSCESS POF CEFATIN AND VANC   Gram Stain   Final    MODERATE WBC PRESENT, PREDOMINANTLY PMN NO ORGANISMS SEEN    Culture   Final    FEW STREPTOCOCCUS INTERMEDIUS NO ANAEROBES ISOLATED    Report Status 11/07/2016 FINAL  Final   Organism ID, Bacteria STREPTOCOCCUS INTERMEDIUS  Final      Susceptibility   Streptococcus intermedius - MIC*    PENICILLIN <=0.06 SENSITIVE Sensitive     CEFTRIAXONE <=0.12 SENSITIVE Sensitive     ERYTHROMYCIN <=0.12 SENSITIVE Sensitive     LEVOFLOXACIN 0.5 SENSITIVE Sensitive     VANCOMYCIN 0.5 SENSITIVE Sensitive     * FEW STREPTOCOCCUS INTERMEDIUS  Aerobic/Anaerobic Culture (surgical/deep wound)     Status: None   Collection Time: 11/01/16 12:01 PM  Result Value Ref Range Status   Specimen Description WOUND BACK  Final   Special Requests LUMBAR L4 L5 FACET SPEC C POF CEFTIN AND VANC  Final   Gram Stain   Final    MODERATE WBC PRESENT, PREDOMINANTLY PMN NO ORGANISMS SEEN    Culture   Final    FEW STREPTOCOCCUS INTERMEDIUS NO ANAEROBES ISOLATED    Report Status 11/07/2016 FINAL  Final   Organism ID, Bacteria STREPTOCOCCUS INTERMEDIUS  Final      Susceptibility   Streptococcus intermedius - MIC*    PENICILLIN <=0.06 SENSITIVE Sensitive     CEFTRIAXONE <=0.12 SENSITIVE Sensitive     ERYTHROMYCIN <=0.12 SENSITIVE Sensitive     LEVOFLOXACIN 0.5 SENSITIVE Sensitive     VANCOMYCIN 0.5 SENSITIVE Sensitive     * FEW STREPTOCOCCUS INTERMEDIUS  Aerobic/Anaerobic Culture (surgical/deep wound)     Status: None   Collection Time: 11/01/16  1:47 PM  Result Value Ref Range Status   Specimen Description TISSUE  Final   Special Requests   Final    L4 L5 DISC MATERIAL SPECIMEN CUP D PATIENT ON FOLLOWING  CEFTIN AND VANCOMYCIN    Gram Stain   Final    RARE WBC PRESENT,  PREDOMINANTLY MONONUCLEAR NO ORGANISMS SEEN    Culture No growth aerobically or anaerobically.  Final   Report Status 11/06/2016 FINAL  Final         Radiology Studies: No results found.      Scheduled Meds: . cefTRIAXone (ROCEPHIN)  IV  2 g Intravenous Q24H  . chlorhexidine  15 mL Mouth Rinse BID  . Chlorhexidine Gluconate Cloth  6 each Topical Daily  . folic acid  1 mg Intravenous Daily  . lactulose  30 g Oral BID  . mouth rinse  15 mL Mouth Rinse q12n4p  . multivitamin with minerals  1 tablet Oral Daily  . pantoprazole sodium  40 mg Oral Daily  . protein supplement shake  11 oz Oral BID BM  . rifaximin  400 mg Oral Q8H  . risperiDONE  1 mg Oral Q12H  . sodium chloride flush  10-40 mL Intracatheter Q12H  . thiamine  100 mg Intravenous Daily   Continuous Infusions: . sodium chloride 75 mL/hr at 11/09/16 0518     LOS: 24 days      [facility], MD [facility] Pager [phone]  If 7PM-7AM, please contact night-coverage [email or web address] Password [facility] 11/09/2016, 12:30 PM

## 2016-11-09 NOTE — Progress Notes (Signed)
Physical Therapy Treatment Patient Details Name: [name] MRN: [id number] DOB: 08-27-1981 Today's Date: 11/09/2016    History of Present Illness 36 yo male developed swelling in Rt leg and foot, and back pain.  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  He was seen by orthopedics, and started on IV abx. 2/04 surgery for decompressive posterior lumbar laminectomy L4-5 and I&D of right thigh abscess  He has hx of Hepatitis C and ETOH.  He was noted to have jaundice and was seen by GI.  He was started on prednisone for alcoholic hepatitis.  He was also seen by ID.  He developed progressive altered mental status due to delirium tremens.  He was transferred to ICU and intubated 2/3; self extubated 2/7.       PT Comments    Pt very agreeable to mobilize today and tolerated improved distance of ambulation well.  However, limited mobility today due to excessive R thigh incision drainage.  Pt continues to require multimodal cues and increased assist for mobility.  Pt requires increased time to perform tasks safely and within precautions as well.   Follow Up Recommendations  [name]     Equipment Recommendations  None recommended by PT    Recommendations for Other Services       Precautions / Restrictions Precautions Precautions: Back Precaution Booklet Issued: No Precaution Comments: reviewed during mobility, pt still with decreased cognition Required Braces or Orthoses: Spinal Brace Spinal Brace: Applied in sitting position;Thoracolumbosacral orthotic    Mobility  Bed Mobility Overal bed mobility: Needs Assistance Bed Mobility: Rolling;Sit to Sidelying;Sidelying to Sit Rolling: +2 for physical assistance;Mod assist Sidelying to sit: +2 for physical assistance;Max assist     Sit to sidelying: Max assist;+2 for physical assistance General bed mobility comments: max multimodal cues provided for directions, sequencing  and maintaining back precautions  Transfers Overall transfer level: Needs assistance Equipment used: Rolling walker (2 wheeled) Transfers: Sit to/from Stand Sit to Stand: Mod assist;+2 physical assistance         General transfer comment: cues for hand placement and maintaining precautions   Ambulation/Gait Ambulation/Gait assistance: Min assist;+2 safety/equipment (intially +2 assistance and then progressed to +1 assistance.  ) Ambulation Distance (Feet): 90 Feet Assistive device: Rolling walker (2 wheeled) Gait Pattern/deviations: Wide base of support;Trunk flexed;Step-to pattern;Step-through pattern Gait velocity: decreased   General Gait Details: small steps however pt able to remain within RW today, no leaned observed this session either, pt reports feeling well, still presents with decreased weight shift on R LE/difficulty advancing, gait limited by R thigh incision excessive drainage (RN assisted with getting pt back to room in recliner and redressing wound   Stairs            Wheelchair Mobility    Modified Rankin (Stroke Patients Only)       Balance                                    Cognition Arousal/Alertness: Awake/alert Behavior During Therapy: WFL for tasks assessed/performed Overall Cognitive Status: Impaired/Different from baseline Area of Impairment: Following commands;Memory;Safety/judgement;Awareness;Problem solving;Orientation Orientation Level: Situation   Memory: Decreased recall of precautions;Decreased short-term memory Following Commands: Follows one step commands with increased time;Follows one step commands inconsistently Safety/Judgement: Decreased awareness of safety;Decreased awareness of deficits Awareness: Intellectual Problem Solving: Slow processing;Difficulty sequencing;Requires verbal cues;Requires tactile cues General  Comments: Pt presents with decreased cognition most of the time however he has intermittent  periods of appearing lucid.      Exercises      General Comments        Pertinent Vitals/Pain Pain Assessment: No/denies pain Pain Intervention(s): Monitored during session;Repositioned    Home Living                      Prior Function            PT Goals (current goals can now be found in the care plan section) Progress towards PT goals: Progressing toward goals    Frequency    Min 4X/week      PT Plan Current plan remains appropriate    Co-evaluation             End of Session Equipment Utilized During Treatment: Back brace;Gait belt Activity Tolerance: Patient tolerated treatment well Patient left: with call bell/phone within reach;with nursing/sitter in room;in bed;with bed alarm set     Time: [id number] PT Time Calculation (min) (ACUTE ONLY): 31 min  Charges:  $Gait Training: 8-22 mins $Therapeutic Activity: 8-22 mins                    G Codes:      Admir Candelas,KATHrine E 11/25/2016, 12:36 PM Carmelia Bake, PT, DPT 2016/11/25 Pager: [phone]

## 2016-11-10 LAB — PROTIME-INR
INR: 1.95
Prothrombin Time: 22.5 seconds — ABNORMAL HIGH (ref 11.4–15.2)

## 2016-11-10 MED ORDER — TRAMADOL HCL 50 MG PO TABS
25.0000 mg | ORAL_TABLET | Freq: Four times a day (QID) | ORAL | Status: DC | PRN
  Administered 2016-11-10 – 2016-11-12 (×5): 25 mg via ORAL
  Filled 2016-11-10 (×5): qty 1

## 2016-11-10 NOTE — Clinical Social Work Note (Signed)
Case discussed with rehab admissions coordinator (please see note from [place] on 2/13). CSW will pursue [name] placement. CSW to contact mom this afternoon to encourage her to make a [name] bed choice.   Liz Beach MSW, [place], [place], [phone]

## 2016-11-10 NOTE — Progress Notes (Signed)
Rehab admissions - I had a long discussion with case Freight forwarder and Education officer, museum. Please see progress note from attending MD yesterday referencing ID recommendations regarding IV antibiotic needs.   Inpatient rehab will not be able to accommodate this patient because the patient will need 6 weeks of IV antibiotics in a supervised setting.  Inpatient rehab is a short length of stay.  BCBS will not authorize both inpatient rehab and then [name] placement.  Therefore, patient will need to discharge directly to [name] for antibiotic needs to be met.  I will not pursue inpatient rehab at this point.  Call me for questions.  #[phone]

## 2016-11-10 NOTE — Progress Notes (Signed)
Rehab admissions - I spoke with patient's mother by phone today.  Patient lives with his girlfriend, she works.  Brother works.  Mom is retired.  We have not had beds on inpatient rehab this week to accommodate patient.  I no longer have insurance authorization.  Once I hear back from mom tomorrow after her meeting with palliative care, can potentially request admission from [facility] depending on response of mom.  I will need to be sure that someone can provide supervision at the time of discharge.  I will follow up with mom tomorrow after meeting with palliative is completed.  Call me for questions.  [phone]

## 2016-11-10 NOTE — Progress Notes (Signed)
Palliative Care  I had a long conversation with [name]'s mother over the phone this morning. She is anxious to meet and had numerous questions and concerns that she would like to discuss. She is feeling poorly today and asked if we could meet tomorrow (2/14) at 1330, at which time she could be here with her other son and [name]'s girlfriend. I felt that was a good plan, and did clarify we could talk over the phone if she continues to feel unwell.   Family meeting planned for 2/14 at 1330.  [address] [facility] [phone] (cell, 7a-4p) [phone] (team phone, after hours and on weekends)  No charge note.

## 2016-11-10 NOTE — Progress Notes (Signed)
Physical Therapy Treatment Patient Details Name: [name] MRN: [id number] DOB: [birth year] Today's Date: 11/10/2016    History of Present Illness 36 yo male developed swelling in Rt leg and foot, and back pain.  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  He was seen by orthopedics, and started on IV abx. 2/04 surgery for decompressive posterior lumbar laminectomy L4-5 and I&D of right thigh abscess  He has hx of Hepatitis C and ETOH.  He was noted to have jaundice and was seen by GI.  He was started on prednisone for alcoholic hepatitis.  He was also seen by ID.  He developed progressive altered mental status due to delirium tremens.  He was transferred to ICU and intubated 2/3; self extubated 2/7.       PT Comments    Patient progressing well towards PT goals. Requires less assist to get to EOB today with increased time. Continues to demonstrate poor safety awareness, imbalance, problem solving and impaired memory. Not able to recall back precautions so reviewed with patient. Mobility limited due to excessive drainage from right thigh incision. RN notified and helped to reinforce prior to PT session. Will continue to follow. Appropriate for [name].   Follow Up Recommendations  [name]     Equipment Recommendations  None recommended by PT    Recommendations for Other Services       Precautions / Restrictions Precautions Precautions: Back Precaution Booklet Issued: No Precaution Comments: reviewed during mobility. Not able to recall any precautions. Required Braces or Orthoses: Spinal Brace Spinal Brace: Applied in sitting position;Thoracolumbosacral orthotic Restrictions Weight Bearing Restrictions: No    Mobility  Bed Mobility Overal bed mobility: Needs Assistance Bed Mobility: Rolling;Sidelying to Sit Rolling: Supervision Sidelying to sit: Supervision;HOB elevated       General bed mobility comments:  Increased time to perform, good demo of log roll technique and use of rail for support. + dizziness- resolved.  Transfers Overall transfer level: Needs assistance Equipment used: Rolling walker (2 wheeled);None Transfers: Sit to/from Stand Sit to Stand: Min guard;Min assist         General transfer comment: Stood from EOB without RW in front to urinate with LOB posteriorly requiring Min A to correct. Transferred to chair post ambulation.  Ambulation/Gait Ambulation/Gait assistance: Min assist;+2 safety/equipment Ambulation Distance (Feet): 70 Feet Assistive device: Rolling walker (2 wheeled) Gait Pattern/deviations: Step-through pattern;Decreased stride length;Trunk flexed;Step-to pattern;Wide base of support Gait velocity: decreased   General Gait Details: Slow, small steps with difficulty advancing RLE initially. Unsteady requiring Min A for balance. Limited due to right thigh incision draining. RN notified.    Stairs            Wheelchair Mobility    Modified Rankin (Stroke Patients Only)       Balance Overall balance assessment: Needs assistance Sitting-balance support: Feet supported;No upper extremity supported Sitting balance-Leahy Scale: Good Sitting balance - Comments: total A to donn LSO and socks sitting EOB.    Standing balance support: During functional activity Standing balance-Leahy Scale: Fair Standing balance comment: Able to stand with 1 UE support to use urinal but LOB x2 during dynamic standing and needed Min A. Stood from 4 mins when RN addressing dressing.                    Cognition Arousal/Alertness: Awake/alert Behavior During Therapy: Impulsive Overall Cognitive Status: Impaired/Different from baseline Area of Impairment: Following commands;Memory;Safety/judgement;Awareness;Problem solving  Current Attention Level: Selective Memory: Decreased recall of precautions;Decreased short-term memory Following Commands: Follows one  step commands with increased time;Follows one step commands inconsistently Safety/Judgement: Decreased awareness of safety;Decreased awareness of deficits Awareness: Intellectual Problem Solving: Slow processing;Difficulty sequencing;Requires verbal cues;Requires tactile cues General Comments: Impulsive and demonstrates poor safety awareness. Getting up EOB to use urinal despite therapist asking to use EOB.    Exercises      General Comments        Pertinent Vitals/Pain Pain Assessment: 0-10 Pain Score: 8  (15 post mobility) Pain Location: Right posterior thigh Pain Descriptors / Indicators: Sore;Grimacing Pain Intervention(s): Monitored during session;Repositioned;Limited activity within patient's tolerance    Home Living                      Prior Function            PT Goals (current goals can now be found in the care plan section) Progress towards PT goals: Progressing toward goals    Frequency    Min 4X/week      PT Plan Current plan remains appropriate    Co-evaluation             End of Session Equipment Utilized During Treatment: Back brace;Gait belt Activity Tolerance: Patient tolerated treatment well;Patient limited by pain Patient left: in chair;with call bell/phone within reach;with chair alarm set     Time: [id number] PT Time Calculation (min) (ACUTE ONLY): 33 min  Charges:  $Gait Training: 8-22 mins $Therapeutic Activity: 8-22 mins                    G Codes:      Elloise Roark A Liahm Grivas 11/10/2016, 12:05 PM  Wray Kearns, [place], DPT [phone]

## 2016-11-10 NOTE — Consult Note (Signed)
Consultation Note Date: 11/11/2016   Patient Name: [name]  DOB: Mar 12, 1981  MRN: [id number]  Age / Sex: 36 y.o., male  PCP: Nicholes Rough, PA-C Referring Physician: Albertine Patricia, MD  Reason for Consultation: Establishing goals of care  HPI/Patient Profile: 36 y.o. male  with past medical history of chronic hepatitis C, alcoholic hepatitis, fatty liver, ongoing ETOH intake, and prior IVDA who was admitted on 10/29/2016 with L4-5 right facet joint septic arthritis, and soft tissue abscess within the adjacent posterior paraspinous musculature. On admission he was also undergoing DTs, and required intubation in the ICU. He is now s/p L4-5 decompressive posterior lumbar laminectomy and  I&D of thigh abscess (done 2/4). ID followed and recommended 6 weeks of antibiotics, for which he will need to be in a completely supervised setting if getting IV antibiotics given hx of IVDA. GI following for acute hepatic failure r/t alcoholic hepatitis, which they feel will take 1-3 months to resolve and for which he remains at high risk for hepatic decompensation during this time. He is presently extubated with improved mentation. Palliative was consulted to assist in goals of care.  Clinical Assessment and Goals of Care: [name] was awake and alert on my arrival to his room. We have a planned family meeting at 34, however I stopped by early to evaluate him. He was pleasantly conversational, fully alert and oriented, and was happy to talk with me. We talked about the plan moving forward, which he articulated was to continue antibiotics to treat his infection, and utilize rehab for strength building. He is focused on stopping drinking, and plans to attend AA with his girlfriend. He does verbalize an understanding that he cannot have any alcohol moving forward. He does want to pursue any and all interventions that will prolong his life.  I then met with his friends and  family. His mother, brother, best friend, and girlfriend were all present. I reviewed the medical issues at play, and outlined the plan for management. We discussed at length the plan for rehab, and I relayed the [name] vs [name]/Rehab decision was determined by insurance coverage, which we wouldn't know until the day of discharge. We also talked about the need to completely abstain from alcohol, and I encouraged AA meetings and utilizing the online resources to ensure support as he makes that change. His family was very appreciative of the support, had extensive appropriate questions I was able to answer, and do seem committed to helping Olufemi on a path towards wellness.   Primary Decision Maker PATIENT   SUMMARY OF RECOMMENDATIONS    Full code, full scope interventions  [name] vs [name]/rehab based on insurance  Would have Palliative Care follow at [name]   F/u with primary GI provider after discharge; I encouraged family to attend this meeting as they had numerous questions about his liver function and plan for treating his multiple liver issues  Code Status/Advance Care Planning:  Full code  Symptom Management:   Pain: continue PRN Ultram  Palliative Prophylaxis:   Bowel Regimen, Frequent Pain Assessment and Palliative Wound Care  Additional Recommendations (Limitations, Scope, Preferences):  Full Scope Treatment  Psycho-social/Spiritual:   Desire for further Chaplaincy support:no  Prognosis:   Unable to determine  Discharge Planning: [name] vs [name]/Rehab; Palliative to follow if pt goes to [name]     Primary Diagnoses: Present on Admission: . Epidural abscess . Liver failure ([place]) . Hyponatremia . Cellulitis of right lower extremity . EtOH dependence ([place]) . Thrombocytopenia ([place]) .  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 The following aspects are pertinent.  Past Medical History:    Diagnosis Date  . Hepatitis C    Social History   Social History  . Marital status: Single    Spouse name: N/A  . Number of children: N/A  . Years of education: N/A   Social History Main Topics  . Smoking status: Current Some Day Smoker  . Smokeless tobacco: Never Used  . Alcohol use Yes  . Drug use: No  . Sexual activity: Not Asked   Other Topics Concern  . None   Social History Narrative  . None   History reviewed. No pertinent family history. Scheduled Meds: . cefTRIAXone (ROCEPHIN)  IV  2 g Intravenous Q24H  . chlorhexidine  15 mL Mouth Rinse BID  . Chlorhexidine Gluconate Cloth  6 each Topical Daily  . folic acid  1 mg Oral Daily  . lactulose  30 g Oral BID  . mouth rinse  15 mL Mouth Rinse q12n4p  . multivitamin with minerals  1 tablet Oral Daily  . pantoprazole sodium  40 mg Oral Daily  . protein supplement shake  11 oz Oral BID BM  . rifaximin  400 mg Oral Q8H  . risperiDONE  1 mg Oral Q12H  . sodium chloride flush  10-40 mL Intracatheter Q12H  . thiamine  100 mg Oral Daily   Continuous Infusions: PRN Meds:.haloperidol lactate, labetalol, ondansetron **OR** ondansetron (ZOFRAN) IV, sodium chloride flush No Known Allergies   Review of Systems  Constitutional: Positive for activity change and fatigue. Negative for appetite change.  HENT: Positive for sore throat. Negative for congestion, hearing loss, sinus pressure and trouble swallowing.   Eyes: Negative for visual disturbance.  Respiratory: Negative for chest tightness, shortness of breath and wheezing.   Cardiovascular: Negative for chest pain.  Gastrointestinal: Negative for abdominal pain.  Musculoskeletal: Positive for back pain and gait problem.  Skin: Positive for pallor.  Neurological: Positive for tremors and weakness. Negative for dizziness and light-headedness.  Psychiatric/Behavioral: Negative for agitation, confusion and sleep disturbance. The patient is not nervous/anxious.    Physical  Exam  Constitutional: He is oriented to person, place, and time. He appears well-developed and well-nourished. No distress.  Slightly tremulous  HENT:  Head: Normocephalic and atraumatic.  Mouth/Throat: No oropharyngeal exudate.  Eyes: EOM are normal.  Neck: Normal range of motion.  Cardiovascular: Normal rate.   Pulmonary/Chest: Effort normal.  Abdominal: Soft.  Musculoskeletal: He exhibits edema (trace in BLE).  Neurological: He is alert and oriented to person, place, and time.  Skin: Skin is warm and dry. There is pallor.  Psychiatric: He has a normal mood and affect. His behavior is normal. Judgment and thought content normal.   Vital Signs: BP 128/77 (BP Location: Other (Comment)) Comment (BP Location): left upper forearm   Pulse 81   Temp 98.2 F (36.8 C) (Oral)   Resp 18   Ht '5\' 11"'  (1.803 m)   Wt 108.9 kg (240 lb 1.6 oz)   SpO2 98%   BMI 33.49 kg/m  Pain Assessment: No/denies pain POSS *See Group Information*: S-Acceptable,Sleep, easy to arouse Pain Score: 0-No pain   SpO2: SpO2: 98 % O2 Device:SpO2: 98 % O2 Flow Rate: .O2 Flow Rate (L/min): 1.5 L/min  IO: Intake/output summary:  Intake/Output Summary (Last 24 hours) at 11/10/16 0813 Last data filed at 11/10/16 3235  Gross  per 24 hour  Intake           1317.5 ml  Output             3300 ml  Net          -1982.5 ml    LBM: Last BM Date: 11/08/16 Baseline Weight: Weight: 120 kg (264 lb 8 oz) Most recent weight: Weight: 108.9 kg (240 lb 1.6 oz)     Palliative Assessment/Data: PPS 60%   Flowsheet Rows   Flowsheet Row Most Recent Value  Intake Tab  Referral Department  Hospitalist  Unit at Time of Referral  Cardiac/Telemetry Unit  Palliative Care Primary Diagnosis  Other (Comment) [cirrhosis]  Date Notified  11/08/16  Palliative Care Type  New Palliative care  Reason for referral  Clarify Goals of Care  Date of Admission  10/29/16  # of days IP prior to Palliative referral  10  Clinical Assessment    Psychosocial & Spiritual Assessment  Palliative Care Outcomes     Time Total: 70 minutes Greater than 50%  of this time was spent counseling and coordinating care related to the above assessment and plan.  Signed by: Charlynn Court, NP Palliative Medicine Team Pager # [phone] (M-F 7a-5p) Team Phone # [phone] (Nights/Weekends)

## 2016-11-10 NOTE — Progress Notes (Signed)
Called for consent from mother, [name]. She doesn't want to give her approval or consent for PICC procedure until the meeting tomorrow. Josh RN made aware.

## 2016-11-10 NOTE — Progress Notes (Signed)
Patient has had multiple hallucinations this shift.  Patients mother did visit yesterday evening, was here at the start of this RNs shift.  After patient's mother left, RN entered room and patient asking if his mother was laying on the floor.  RN explained that she wasn't that she had already left for the evening and patient replied "oh I thought I saw her laying on the floor".  IV pump/pole was beside patient's bed and patient reaching for it.  RN heard telesitter addressing and RN entered room.  Patient asked what the old time camera was doing in his room.  RN explained to patient that it was his IV pump/pole.  Patient stated "no it's one of those old time cameras that you get under and it has the big flash".  RN moved IV pump/pole beside the sink on the other side of the curtain from patient.  RN in room administering medication this morning and patient stated look over there and pointed to the windows that looks like three stoves.  RN explained to patient that was just the windows.

## 2016-11-10 NOTE — Progress Notes (Signed)
PROGRESS NOTE  [name]  [id number] DOB: 12/25/1980  DOA: 10/29/2016 PCP: Nicholes Rough, PA-C   Brief Narrative:  36 year old male with a PMH of chronic hepatitis C, alcoholic hepatitis, fatty liver, ongoing occasional alcohol intake, remote IVDA, seen by OP pain MD for increasing back pain, right lower extremity cellulitis and edema, ruled out DVT, presented back to provide her when stat MRI confirmed L4-5 septic right facet arthritis and abscess within the paraspinal musculature. Due to altered mental status from delirium tremens, he had to be transferred to ICU, intubated and was under CCM care. Orthopedics, [facility], infectious disease assisted with management. He underwent back and right lower extremity surgery. Extubated and stabilized. Care transferred over to [facility] on 11/06/16.   Assessment & Plan:   Principal Problem:   Epidural abscess Active Problems:   Liver failure (HCC)   Hyponatremia   Cellulitis of right lower extremity   EtOH dependence (HCC)   Thrombocytopenia (HCC)   Cirrhosis (HCC)   Paraspinal abscess (HCC)   Chronic hepatitis C without hepatic coma (HCC)   Hepatitis   Abnormal transaminases   Alcoholic hepatitis without ascites   ETOH abuse   Abscess of right lower extremity   Cellulitis and abscess of right lower extremity   Hypernatremia   Delirium tremens (HCC)   Infection of lumbar spine (HCC)   Pyogenic arthritis of multiple sites [facility])   Traumatic compartment syndrome of other sites [facility])   Alcoholic liver failure ([place])   Traumatic compartment syndrome of lower extremity (HCC)   Epidural abscess with L4-L5 facet septic arthritis:  Orthopedics and infectious disease were consulted. He was empirically started on IV vancomycin and Zosyn was changed to cefepime. Orthopedics wanted to operate but were concerned regarding anesthesia risks given acute hepatic failure and requested GI input. GI indicated that he will be high risk. However it was  determined that without definitive treatment, he will most likely continue to deteriorate. He continued to have worsening pain. After discussing risks and benefits with family, orthopedics proceeded with surgery. He was intubated for airway protection due to DTs. He was given vitamin K, FFP and platelets prior to surgery. He underwent L4-5 decompressive posterior lumbar laminectomy along with I&D of thigh abscess on 11/01/16. Post surgery, antibiotics were changed to vancomycin and ceftriaxone. Drains were removed. Doppler of leg on 10/26/16 negative for thrombus. Cultures then grew streptococci viridans and Streptococcus intermedius. Antibiotics were narrowed to IV ceftriaxone on 11/04/16. ID recommended 6 weeks of IV ceftriaxone in a completely supervised setting given prior history of IVDA. Please refer to Dr. Tommy Medal, ID note off 11/05/16 for details of antibiotics, duration and lab monitoring. Rocephin end date 12/19/16. Discussed with ID and will place PICC line. Post surgery orthopedics recommended mobilization with PT, upper therapy and TLSO brace. PT and OT evaluated and recommended [name].  Right posterior deep thigh abscess/pyomyositis:  Status post I&D by orthopedics on 11/01/16. Rest of management as indicated above.  Acute hepatic failure/Jaundice/hepatitis C/fatty liver:  History of hepatitis C and fatty liver. History of severe alcoholic hepatitis [id number]. Never followed through with planned treatment with [facility] after failing some unspecified antiviral in 2016. MELD=24 & discriminant function 66 consistent with recurrent alcoholic hepatitis. GI was consulted. Patient was started on prednisone 40 mg daily 30 days which was switched to IV Solu-Medrol while he was intubated, for alcoholic hepatitis. Steroids were then stopped on 11/02/16 due to ongoing infection. As per GI, alcoholic hepatitis typically takes 1-3 months to resolve and  he is at increased risk for hepatic decompensation, morbidity and mortality  from this and this risk will not likely change for 1-2 months. Avoid alcohol completely and acetaminophen. He was also placed on lactulose and rifaximin. LFTs improving. He has to follow up with his primary GI doctor (Dr. Alice Reichert) in 3-4 weeks from discharge.  Alcohol abuse and likely dependence:  On 10/31/16, patient developed progressive altered mental status due to delirium tremens and he was transferred to ICU. He was started on Precedex drip and multivitamins.  Coagulopathy Secondary to acute liver failure. No bleeding reported.  Thrombocytopenia Secondary to liver disease. Improved and stable.  Hyponatremia Resolved.  Acute hypoxic respiratory failure secondary to unable to protect airways due to DTs:  Transfer to ICU. Intubated 11/01/16. CCM managed. Patient self extubated on 11/04/16. Maintaining airway.  Acute encephalopathy secondary to delirium tremens/hepatic encephalopathy:  - Remains on rifaximin and lactulose. [facility] assisted with management. DTs improved but patient remained confused. His mental status changes were felt to be multifactorial secondary to medications in addition to hepatic encephalopathy and ongoing infections. Sedative medications were minimized. Ammonia has normalized. Remains on risperidone 1 MG twice a day. Sleep hygiene. Continue to monitor closely. Appears to be improving i  Hypoglycemia: Resolved. Speech therapy evaluated and recommended a regular consistency diet.  Hypokalemia and hypomagnesemia - Replace and follow as needed.  Anemia - Stable.  Goals of care:  As per chart review, patient's brother indicated that patient's significant other is not his wife and patient's mother should be in charge of making healthcare decisions, CODE STATUS and short-term versus long-term goals care. PMT's onboard to further evaluate case and assist.   DVT prophylaxis: SCDs Code Status: Full Family Communication: None at bedside Disposition Plan: [name] when  medically stable. In 1-2 days   Consultants:   Orthopedics  [facility]  Infectious disease  CCM  Rehabilitation M.D.  Procedures:   Intubated for mechanical ventilation 11/01/16  Central line 11/01/16-Discontinued   I&D of right posterior deep deep thigh abscess by orthopedics on 11/01/16  Decompressive posterior lumbar laminectomy level IV-V by orthopedics on 11/01/16  NG tube feeding-discontinued.  Arterial line-discontinued  Foley catheter  Antimicrobials:   IV Rocephin  Rifaximin     Subjective: Seen this morning.no acute events overnight, reports pain in right lower thigh is controlled , that patient has been eating and drinking well,   Objective:  Vitals:   11/10/16 0325 11/10/16 0339 11/10/16 0350 11/10/16 0800  BP: (!) 141/68  128/77 132/69  Pulse: 81   77  Resp:   18 20  Temp: 98.2 F (36.8 C)   98.4 F (36.9 C)  TempSrc: Oral   Oral  SpO2: 98%   98%  Weight:  108.9 kg (240 lb 1.6 oz)    Height:        Intake/Output Summary (Last 24 hours) at 11/10/16 1032 Last data filed at 11/10/16 0900  Gross per 24 hour  Intake              965 ml  Output             1700 ml  Net             -735 ml   Filed Weights   11/07/16 0322 11/09/16 0420 11/10/16 0339  Weight: 116.1 kg (256 lb) 114.3 kg (252 lb) 108.9 kg (240 lb 1.6 oz)    Examination:  General exam: Pleasant young male lying propped up in bed. Appears comfortable  but fidgety at times. Scleral icterus. Respiratory system: Clear to auscultation. Respiratory effort normal. Cardiovascular system: S1 & S2 heard, RRR. No JVD, murmurs, rubs, gallops or clicks. No pedal edema. Telemetry: SR-ST in the 100s. Gastrointestinal system: Abdomen is nondistended, soft and nontender. No organomegaly or masses felt. Normal bowel sounds heard. Foley catheter +. Central nervous system: Alert and oriented only to self. No focal neurological deficits. Extremities: Symmetric 5 x 5 power. Bilateral wrist restraints and  mittens. Skin: No rashes, lesions or ulcers. Lumbar spine surgical site and right posterior thigh site dressing clean and dry. Psychiatry: Awake, alert and appropriate    Data Reviewed: I have personally reviewed following labs and imaging studies  CBC:  Recent Labs Lab 11/05/16 0300 11/06/16 0518 11/07/16 0909 11/08/16 0849 11/09/16 1527  WBC 14.5* 12.2* 9.7 10.4 11.4*  NEUTROABS  --   --  5.6 6.8  --   HGB 9.1* 9.7* 9.6* 9.2* 9.0*  HCT 28.0* 29.7* 29.8* 28.5* 28.3*  MCV 102.2* 103.8* 103.8* 104.4* 105.6*  PLT 127* 148* 142* 146* [id number]*   Basic Metabolic Panel:  Recent Labs Lab 11/03/16 1700  11/05/16 0300 11/06/16 0518 11/07/16 0909 11/08/16 0849 11/09/16 1527  NA  --   < > 141 140 139 140 138  K  --   < > 3.4* 3.5 3.4* 3.2* 3.6  CL  --   < > 109 102 107 106 109  CO2  --   < > 24 27 24 27 24   GLUCOSE  --   < > 116* 99 94 89 104*  BUN  --   < > 12 7 6  <5* <5*  CREATININE  --   < > 0.65 0.60* 0.56* 0.59* 0.68  CALCIUM  --   < > 8.9 8.3* 8.5* 8.1* 8.0*  MG 2.0  --   --   --  1.5* 1.7 1.7  PHOS 4.4  --   --   --   --   --   --   < > = values in this interval not displayed. GFR: Estimated Creatinine Clearance: 161.7 mL/min (by C-G formula based on SCr of 0.68 mg/dL). Liver Function Tests:  Recent Labs Lab 11/06/16 0518 11/07/16 0754 11/07/16 0909 11/08/16 0849 11/09/16 1527  AST 125* 116* 122* 105* 115*  ALT 56 52 56 48 51  ALKPHOS 110 99 101 92 92  BILITOT 10.5* 8.5* 8.7* 7.5* 6.2*  PROT 5.8* 5.9* 5.7* 5.5* 6.1*  ALBUMIN 1.8* 1.8* 1.8* 1.6* 1.8*   No results for input(s): LIPASE, AMYLASE in the last 168 hours.  Recent Labs Lab 11/03/16 1241 11/07/16 0909 11/08/16 0849  AMMONIA 66* 53* 37*   Coagulation Profile:  Recent Labs Lab 11/06/16 0518 11/07/16 0754 11/08/16 0417 11/09/16 1527 11/10/16 0439  INR 1.89 1.98 1.99 1.93 1.95   Cardiac Enzymes: No results for input(s): CKTOTAL, CKMB, CKMBINDEX, TROPONINI in the last 168 hours. BNP (last  3 results) No results for input(s): PROBNP in the last 8760 hours. HbA1C: No results for input(s): HGBA1C in the last 72 hours. CBG:  Recent Labs Lab 11/08/16 1655 11/08/16 1955 11/09/16 0044 11/09/16 0911 11/09/16 1125  GLUCAP 105* 95 83 124* 105*   Lipid Profile: No results for input(s): CHOL, HDL, LDLCALC, TRIG, CHOLHDL, LDLDIRECT in the last 72 hours. Thyroid Function Tests: No results for input(s): TSH, T4TOTAL, FREET4, T3FREE, THYROIDAB in the last 72 hours. Anemia Panel: No results for input(s): VITAMINB12, FOLATE, FERRITIN, TIBC, IRON, RETICCTPCT in the last 72 hours.  Sepsis Labs:  Recent Labs Lab 11/06/16 0518 11/07/16 0909 11/08/16 0849 11/09/16 1527  WBC 12.2* 9.7 10.4 11.4*    Recent Results (from the past 240 hour(s))  Aerobic/Anaerobic Culture (surgical/deep wound)     Status: None   Collection Time: 11/01/16 11:23 AM  Result Value Ref Range Status   Specimen Description ABSCESS RIGHT THIGH  Final   Special Requests POSTERIOR THIGH  Final   Gram Stain   Final    FEW WBC PRESENT, PREDOMINANTLY PMN RARE GRAM POSITIVE COCCI IN PAIRS    Culture   Final    FEW STREPTOCOCCUS INTERMEDIUS NO ANAEROBES ISOLATED    Report Status 11/07/2016 FINAL  Final   Organism ID, Bacteria STREPTOCOCCUS INTERMEDIUS  Final      Susceptibility   Streptococcus intermedius - MIC*    PENICILLIN <=0.06 SENSITIVE Sensitive     CEFTRIAXONE <=0.12 SENSITIVE Sensitive     ERYTHROMYCIN <=0.12 SENSITIVE Sensitive     LEVOFLOXACIN 0.5 SENSITIVE Sensitive     VANCOMYCIN 0.5 SENSITIVE Sensitive     * FEW STREPTOCOCCUS INTERMEDIUS  Aerobic/Anaerobic Culture (surgical/deep wound)     Status: None   Collection Time: 11/01/16 11:42 AM  Result Value Ref Range Status   Specimen Description ABSCESS RIGHT BACK  Final   Special Requests   Final    POSTERIOR LATERAL LUMBAR L4 L5 ABSCESS POF CEFATIN AND VANC   Gram Stain   Final    MODERATE WBC PRESENT, PREDOMINANTLY PMN NO ORGANISMS  SEEN    Culture   Final    FEW STREPTOCOCCUS INTERMEDIUS NO ANAEROBES ISOLATED    Report Status 11/07/2016 FINAL  Final   Organism ID, Bacteria STREPTOCOCCUS INTERMEDIUS  Final      Susceptibility   Streptococcus intermedius - MIC*    PENICILLIN <=0.06 SENSITIVE Sensitive     CEFTRIAXONE <=0.12 SENSITIVE Sensitive     ERYTHROMYCIN <=0.12 SENSITIVE Sensitive     LEVOFLOXACIN 0.5 SENSITIVE Sensitive     VANCOMYCIN 0.5 SENSITIVE Sensitive     * FEW STREPTOCOCCUS INTERMEDIUS  Aerobic/Anaerobic Culture (surgical/deep wound)     Status: None   Collection Time: 11/01/16 12:01 PM  Result Value Ref Range Status   Specimen Description WOUND BACK  Final   Special Requests LUMBAR L4 L5 FACET SPEC C POF CEFTIN AND VANC  Final   Gram Stain   Final    MODERATE WBC PRESENT, PREDOMINANTLY PMN NO ORGANISMS SEEN    Culture   Final    FEW STREPTOCOCCUS INTERMEDIUS NO ANAEROBES ISOLATED    Report Status 11/07/2016 FINAL  Final   Organism ID, Bacteria STREPTOCOCCUS INTERMEDIUS  Final      Susceptibility   Streptococcus intermedius - MIC*    PENICILLIN <=0.06 SENSITIVE Sensitive     CEFTRIAXONE <=0.12 SENSITIVE Sensitive     ERYTHROMYCIN <=0.12 SENSITIVE Sensitive     LEVOFLOXACIN 0.5 SENSITIVE Sensitive     VANCOMYCIN 0.5 SENSITIVE Sensitive     * FEW STREPTOCOCCUS INTERMEDIUS  Aerobic/Anaerobic Culture (surgical/deep wound)     Status: None   Collection Time: 11/01/16  1:47 PM  Result Value Ref Range Status   Specimen Description TISSUE  Final   Special Requests   Final    L4 L5 DISC MATERIAL SPECIMEN CUP D PATIENT ON FOLLOWING  CEFTIN AND VANCOMYCIN    Gram Stain   Final    RARE WBC PRESENT, PREDOMINANTLY MONONUCLEAR NO ORGANISMS SEEN    Culture No growth aerobically or anaerobically.  Final   Report Status 11/06/2016  FINAL  Final         Radiology Studies: No results found.      Scheduled Meds: . cefTRIAXone (ROCEPHIN)  IV  2 g Intravenous Q24H  . chlorhexidine  15 mL  Mouth Rinse BID  . Chlorhexidine Gluconate Cloth  6 each Topical Daily  . folic acid  1 mg Oral Daily  . lactulose  30 g Oral BID  . mouth rinse  15 mL Mouth Rinse q12n4p  . multivitamin with minerals  1 tablet Oral Daily  . pantoprazole sodium  40 mg Oral Daily  . protein supplement shake  11 oz Oral BID BM  . rifaximin  400 mg Oral Q8H  . risperiDONE  1 mg Oral Q12H  . sodium chloride flush  10-40 mL Intracatheter Q12H  . thiamine  100 mg Oral Daily   Continuous Infusions:    LOS: 12 days      Azarya Oconnell, MD [facility] Pager [phone]  If 7PM-7AM, please contact night-coverage [email or web address] Password [facility] 11/10/2016, 10:32 AM

## 2016-11-10 NOTE — Clinical Social Work Note (Signed)
CSW went to patient's room to deliver [name] bed options, patient is confused at this time and no family present. CSW contacted patient's mom by phone. Patient's mother [name] sounded as though she had been crying. CSW asked how [name] is feeling. She shares that today has been very overwhelming for her as she has received multiple calls from multiple departments today (rehab, CSW, palliative, doctors etc.). CSW explained reason for call and offered emotional support to the mom. CSW reiterated concern of rehab regarding need for supervised setting to administer 6 weeks of IV Ax as [employer]BS will not approve both [name] and [name].   Given that the patient's stay at [name] will be limited compared to [name], CSW explained that the [name] would be better option moving forward since [name] will not approve a [name] stay post [name] for administration of IV antibiotics. The patient's mother is understanding of this but feels the patient will be best served in an inpatient rehab setting given his age and the amount of therapy he will receive at [name]. [name] states that she does not feel that the patient's past should affect his treatment options. Per family it appears his IVDU is very remote (~15+ years) and the family assures CSW that 24/7 supervision and assistance will be provided for the patient post DC from [name]. CSW explained that MD will ultimately make the decision of whether or not the patient can receive IV Ax in the home. [name] asks that CSW see if this is a possibility.   CSW explained situation to MD who consulted with ID. At this time MD does feel comfortable allowing the patient to go to [name] and return home at discharge and continue to receive his IV Ax. CSW updated rehab admissions coordinator on this change. [name] will have to re-evaluate bed availability 2/14 and update CSW. CSW informed [name] that we can still look at [facility] as a discharge option, but also encouraged her to make a choice of available [name] options by tomorrow so that  we can start auth for [name] in the event [name] cannot commit to admitting the patient.   [name] had several questions regarding palliative care. She states that it didn't "click" for her that [name] was calling from palliative care until CSW mentioned palliative care meeting scheduled for tomorrow afternoon. CSW believes [name] has just been overwhelmed by the numerous phone calls she received today. [name] became very emotional as she associates palliative care with end of life and death. CSW stressed to [name] that meeting with palliative care to discuss goals of care does not mean that her son is dying, but that it's an opportunity for her to clarify what she wants for her son and to become better educated on his condition and options available. This seemed to comfort [name]. CSW shared that families often find the palliative care meeting to be a positive experience and walk away from it with more clarity. CSW assured [name] that at this time we are pursuing rehabilitation (either [name] or [name]) and not "giving up" on [name]. CSW will be available to provide support to family during this transition.   [name] MSW, [place], [place], JI:[id number]

## 2016-11-11 DIAGNOSIS — Z515 Encounter for palliative care: Secondary | ICD-10-CM

## 2016-11-11 DIAGNOSIS — Z7189 Other specified counseling: Secondary | ICD-10-CM

## 2016-11-11 DIAGNOSIS — K7030 Alcoholic cirrhosis of liver without ascites: Secondary | ICD-10-CM

## 2016-11-11 LAB — CBC
HCT: 29.2 % — ABNORMAL LOW (ref 39.0–52.0)
Hemoglobin: 9.2 g/dL — ABNORMAL LOW (ref 13.0–17.0)
MCH: 33.5 pg (ref 26.0–34.0)
MCHC: 31.5 g/dL (ref 30.0–36.0)
MCV: 106.2 fL — ABNORMAL HIGH (ref 78.0–100.0)
Platelets: 108 10*3/uL — ABNORMAL LOW (ref 150–400)
RBC: 2.75 MIL/uL — ABNORMAL LOW (ref 4.22–5.81)
RDW: 18.6 % — ABNORMAL HIGH (ref 11.5–15.5)
WBC: 10 10*3/uL (ref 4.0–10.5)

## 2016-11-11 LAB — BASIC METABOLIC PANEL
ANION GAP: 7 (ref 5–15)
BUN: 5 mg/dL — ABNORMAL LOW (ref 6–20)
CALCIUM: 8 mg/dL — AB (ref 8.9–10.3)
CO2: 23 mmol/L (ref 22–32)
Chloride: 106 mmol/L (ref 101–111)
Creatinine, Ser: 0.69 mg/dL (ref 0.61–1.24)
GFR calc Af Amer: >60 mL/min (ref >60)
GLUCOSE: 86 mg/dL (ref 65–99)
POTASSIUM: 3.5 mmol/L (ref 3.5–5.1)
SODIUM: 136 mmol/L (ref 135–145)

## 2016-11-11 LAB — PROTIME-INR
INR: 1.91
Prothrombin Time: 22.1 seconds — ABNORMAL HIGH (ref 11.4–15.2)

## 2016-11-11 MED ORDER — SODIUM CHLORIDE 0.9% FLUSH
10.0000 mL | Freq: Two times a day (BID) | INTRAVENOUS | Status: DC
  Administered 2016-11-11: 10 mL

## 2016-11-11 MED ORDER — SODIUM CHLORIDE 0.9% FLUSH: 10.0000 mL | INTRAVENOUS | Status: DC | PRN

## 2016-11-11 NOTE — Progress Notes (Signed)
    Subjective: Procedure(s) (LRB): DECOMPRESSIVE POSTERIOR LUMBAR LAMINECTOMY LEVEL 4-5 (N/A) IRRIGATION AND DEBRIDEMENT ABSCESS RIGHT THIGH (Right) 10 Days Post-Op  Patient reports pain as 3 on 0-10 scale.  Reports unchanged leg pain reports incisional back pain   Positive void Positive bowel movement Positive flatus Negative chest pain or shortness of breath  Objective: Vital signs in last 24 hours: Temp:  [97.8 F (36.6 C)-98.4 F (36.9 C)] 97.8 F (36.6 C) (02/14 0526) Pulse Rate:  [123] 123 (02/14 1425) Resp:  [16-18] 16 (02/14 0526) BP: (102-111)/(43-47) 102/43 (02/14 0526) SpO2:  [96 %] 96 % (02/14 0526) Weight:  [108.9 kg (240 lb)] 108.9 kg (240 lb) (02/14 0526)  Intake/Output from previous day: 02/13 0701 - 02/14 0700 In: 1450 [P.O.:1400; IV Piggyback:50] Out: [id number] [Urine:1375]  Labs:  Recent Labs  11/09/16 1527 11/11/16 0537  WBC 11.4* 10.0  RBC 2.68* 2.75*  HCT 28.3* 29.2*  PLT 132* 108*    Recent Labs  11/09/16 1527 11/11/16 0537  NA 138 136  K 3.6 3.5  CL 109 106  CO2 24 23  BUN <5* 5*  CREATININE 0.68 0.69  GLUCOSE 104* 86  CALCIUM 8.0* 8.0*    Recent Labs  11/10/16 0439 11/11/16 0537  INR 1.95 1.91    Physical Exam: Neurologically intact ABD soft Intact pulses distally Incision: moderate drainage Compartment soft  Assessment/Plan: Patient stable  xrays n/a Continue mobilization with physical therapy Continue care  Patient with serous drainage from back incision No fevers noted Overall patient improving  Ambulating and mental status stable Plan on repeat I&D of the back tomorrow with wound vac placement.     Melina Schools, MD [place] [phone]

## 2016-11-11 NOTE — Progress Notes (Signed)
Physical Therapy Treatment Patient Details Name: [name] MRN: [id number] DOB: 1981-01-29 Today's Date: 11/11/2016    History of Present Illness 36 yo male developed swelling in Rt leg and foot, and back pain.  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  He was seen by orthopedics, and started on IV abx. 2/04 surgery for decompressive posterior lumbar laminectomy L4-5 and I&D of right thigh abscess  He has hx of Hepatitis C and ETOH.  He was noted to have jaundice and was seen by GI.  He was started on prednisone for alcoholic hepatitis.  He was also seen by ID.  He developed progressive altered mental status due to delirium tremens.  He was transferred to ICU and intubated 2/3; self extubated 2/7.       PT Comments    Significant improvement with mobility today. Pt ambulated 63' with RW and supervision, HR 123. Further reinforcement of back precautions is needed as he doesn't recall all of them, nor did he recall log roll technique.  PT goals updated.   Follow Up Recommendations  [name]     Equipment Recommendations  None recommended by PT    Recommendations for Other Services       Precautions / Restrictions Precautions Precautions: Back Precaution Booklet Issued: No Precaution Comments: reviewed during mobility, able to recall 2/3 precautions Required Braces or Orthoses: Spinal Brace Spinal Brace: Applied in sitting position;Thoracolumbosacral orthotic (instructed girlfriend in donning brace) Restrictions Weight Bearing Restrictions: No    Mobility  Bed Mobility Overal bed mobility: Needs Assistance Bed Mobility: Rolling;Sidelying to Sit Rolling: Supervision Sidelying to sit: Supervision;HOB elevated       General bed mobility comments: reviewed log roll technique as pt did not recall it  Transfers Overall transfer level: Needs assistance Equipment used: Rolling walker (2 wheeled) Transfers: Sit  to/from Stand Sit to Stand: Min guard;From elevated surface         General transfer comment: sit to stand from slightly elevated bed, VCs for back precautions  Ambulation/Gait Ambulation/Gait assistance: Min guard Ambulation Distance (Feet): 260 Feet Assistive device: Rolling walker (2 wheeled) Gait Pattern/deviations: Step-through pattern;Decreased stride length;Trunk flexed;Step-to pattern Gait velocity: decreased   General Gait Details: VCs for posture and positioning in RW, no LOB, HR 123 walking, pt bumped into obstacles on L x 2 but was able to recover without assist   Stairs            Wheelchair Mobility    Modified Rankin (Stroke Patients Only)       Balance   Sitting-balance support: Feet supported Sitting balance-Leahy Scale: Good       Standing balance-Leahy Scale: Fair                      Cognition Arousal/Alertness: Awake/alert Behavior During Therapy: WFL for tasks assessed/performed Overall Cognitive Status: Within Functional Limits for tasks assessed                      Exercises General Exercises - Lower Extremity Ankle Circles/Pumps: AROM;Both;10 reps;Seated Long Arc Quad: AROM;Both;10 reps;Seated    General Comments        Pertinent Vitals/Pain Pain Score: 7  Pain Location: Right posterior thigh Pain Descriptors / Indicators: Sore Pain Intervention(s): Limited activity within patient's tolerance;Monitored during session;Patient requesting pain meds-RN notified    Home Living  Prior Function            PT Goals (current goals can now be found in the care plan section) Acute Rehab PT Goals 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 Time For Goal Achievement: 11/18/16 Potential to Achieve Goals: Good Progress towards PT goals: Goals met and updated - see care plan    Frequency    Min 4X/week      PT Plan Current plan remains appropriate    Co-evaluation  PT/OT/SLP Co-Evaluation/Treatment: Yes Reason for Co-Treatment: To address functional/ADL transfers PT goals addressed during session: Mobility/safety with mobility       End of Session Equipment Utilized During Treatment: Back brace;Gait belt Activity Tolerance: Patient tolerated treatment well Patient left: in chair;with call bell/phone within reach;with family/visitor present     Time: 7215-8727 PT Time Calculation (min) (ACUTE ONLY): 29 min  Charges:  $Gait Training: 8-22 mins                    G Codes:      Philomena Doheny 11/11/2016, 2:33 PM [phone]

## 2016-11-11 NOTE — Progress Notes (Signed)
OT Cancellation Note  Patient Details Name: [name] MRN: [id number] DOB: 07/13/1981   Cancelled Treatment:    Reason Eval/Treat Not Completed: Patient at procedure or test/ unavailable (getting PICC line placed). Will follow up as time allows.  Binnie Kand M.S., OTR/L Pager: [phone]  11/11/2016, 11:28 AM

## 2016-11-11 NOTE — Progress Notes (Signed)
PROGRESS NOTE  [name]  [id number] DOB: November 26, 1980  DOA: 10/29/2016 PCP: Jose Rough, PA-C   Brief Narrative:  36 year old male with a PMH of chronic hepatitis C, alcoholic hepatitis, fatty liver, ongoing occasional alcohol intake, remote IVDA, seen by OP pain MD for increasing back pain, right lower extremity cellulitis and edema, ruled out DVT, presented back to provide her when stat MRI confirmed L4-5 septic right facet arthritis and abscess within the paraspinal musculature. Due to altered mental status from delirium tremens, he had to be transferred to ICU, intubated and was under CCM care. Orthopedics, [facility], infectious disease assisted with management. He underwent back and right lower extremity surgery. Extubated and stabilized. Care transferred over to [facility] on 11/06/16.   Assessment & Plan:   Principal Problem:   Epidural abscess Active Problems:   Liver failure (HCC)   Hyponatremia   Cellulitis of right lower extremity   EtOH dependence (HCC)   Thrombocytopenia (HCC)   Cirrhosis (HCC)   Paraspinal abscess (HCC)   Chronic hepatitis C without hepatic coma (HCC)   Hepatitis   Abnormal transaminases   Alcoholic hepatitis without ascites   ETOH abuse   Abscess of right lower extremity   Cellulitis and abscess of right lower extremity   Hypernatremia   Delirium tremens (HCC)   Infection of lumbar spine (HCC)   Pyogenic arthritis of multiple sites [facility])   Traumatic compartment syndrome of other sites [facility])   Alcoholic liver failure ([place])   Traumatic compartment syndrome of lower extremity (HCC)   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 empirically  on IV vancomycin and Zosyn was changed to cefepime, ID recommending total 6 weeks of IV Rocephin, history of remote IV drug abuse, more than 15 years ago, discussed with ID, patient and his mother, at this point plan for [name]/[name], and mother will provide  care at home with 24/7 supervision from her multiple family members. - Status post Decompressive posterior lumbar laminectomy level IV-V by orthopedics on 11/01/16 - Post surgery orthopedics recommended mobilization with PT, upper therapy and TLSO brace. PT and OT evaluated and recommended [name].  Right posterior deep thigh abscess/pyomyositis:  - Status post I&D by orthopedics on 11/01/16. Rest of management as indicated above.  Acute hepatic failure/Jaundice/hepatitis C/fatty liver:  - History of hepatitis C and fatty liver. History of severe alcoholic hepatitis [id number]. Never followed through with planned treatment with [facility] after failing some unspecified antiviral in 2016. MELD=24 & discriminant function 66 consistent with recurrent alcoholic hepatitis. GI was consulted. Patient was started on prednisone 40 mg daily 30 days which was switched to IV Solu-Medrol while he was intubated, for alcoholic hepatitis. Steroids were then stopped on 11/02/16 due to ongoing infection. As per GI, alcoholic hepatitis typically takes 1-3 months to resolve and he is at increased risk for hepatic decompensation, morbidity and mortality from this and this risk will not likely change for 1-2 months. Avoid alcohol completely and acetaminophen. He was also placed on lactulose and rifaximin. LFTs improving. - He has to follow up with his primary GI doctor ([name]) in 3-4 weeks from discharge.  Alcohol abuse and likely dependence:  - On 10/31/16, patient developed progressive altered mental status due to delirium tremens and he was transferred to ICU. He was started on Precedex drip and multivitamins.  Coagulopathy - Secondary to acute liver failure. No bleeding reported. Required vitamin K and FFP before surgery  Thrombocytopenia - Secondary to liver  disease. Improved and stable.  Hyponatremia - Resolved.  Acute hypoxic respiratory failure secondary to unable to protect airways due to DTs:  - Transfer to ICU.  Intubated 11/01/16. CCM managed. Patient self extubated on 11/04/16. Maintaining airway.  Acute encephalopathy secondary to delirium tremens/hepatic encephalopathy:  - Remains on rifaximin and lactulose. [facility] assisted with management. DTs improved but patient remained confused. His mental status changes were felt to be multifactorial secondary to medications in addition to hepatic encephalopathy and ongoing infections. Sedative medications were minimized. Ammonia has normalized. Remains on risperidone 1 MG twice a day. Sleep hygiene. Continue to monitor closely. Appears to be improving i  Hypoglycemia: - Resolved. Speech therapy evaluated and recommended a regular consistency diet.  Hypokalemia and hypomagnesemia - Replace and follow as needed.  Anemia - Stable.  Goals of care:  As per chart review, patient's brother indicated that patient's significant other is not his wife and patient's mother should be in charge of making healthcare decisions, CODE STATUS and short-term versus long-term goals care. PMT's onboard to further evaluate case and assist.   DVT prophylaxis: SCDs Code Status: Full Family Communication: Discussed with mother via phone 12/13 Disposition Plan: [name] when medically stable. In 1-2 days   Consultants:   Orthopedics  [facility]  Infectious disease  CCM  Rehabilitation M.D.  Procedures:   Intubated for mechanical ventilation 11/01/16  Central line 11/01/16-Discontinued   I&D of right posterior deep deep thigh abscess by orthopedics on 11/01/16  Decompressive posterior lumbar laminectomy level IV-V by orthopedics on 11/01/16  NG tube feeding-discontinued.  Arterial line-discontinued  Foley catheter  Antimicrobials:   IV Rocephin  Rifaximin     Subjective: Seen this morning.no acute events overnight, reports pain in right lower thigh is controlled , that patient has been eating and drinking well,   Objective:  Vitals:   11/10/16 1153  11/10/16 1633 11/10/16 2031 11/11/16 0526  BP: 101/71 123/65 (!) 111/47 (!) 102/43  Pulse: (!) 105 80    Resp: 20 20 18 16   Temp: 98.5 F (36.9 C) 99.8 F (37.7 C) 98.4 F (36.9 C) 97.8 F (36.6 C)  TempSrc: Oral Oral Oral Oral  SpO2: 100% 100% 96% 96%  Weight:    108.9 kg (240 lb)  Height:    5\' 11"  (1.803 m)    Intake/Output Summary (Last 24 hours) at 11/11/16 1111 Last data filed at 11/11/16 [id number]  Gross per 24 hour  Intake             1210 ml  Output              475 ml  Net              735 ml   Filed Weights   11/09/16 0420 11/10/16 0339 11/11/16 0526  Weight: 114.3 kg (252 lb) 108.9 kg (240 lb 1.6 oz) 108.9 kg (240 lb)    Examination:  General exam: Pleasant young male lying propped up in bed.  Respiratory system: Clear to auscultation. Respiratory effort normal. Cardiovascular system: S1 & S2 heard, RRR. No JVD, murmurs, rubs, gallops or clicks. No pedal edema. Telemetry: SR-ST in the 100s. Gastrointestinal system: Abdomen is nondistended, soft and nontender. No organomegaly or masses felt. Normal bowel sounds heard. Foley catheter +. Central nervous system: Alert and oriented only to self. No focal neurological deficits. Extremities: Symmetric 5 x 5 power. Bilateral wrist restraints and mittens. Skin: No rashes, lesions or ulcers. Lumbar spine surgical site dressing clean and dry.  Right posterior thigh dressing moist from nonpurulent discharge. Psychiatry: Awake, alert and appropriate    Data Reviewed: I have personally reviewed following labs and imaging studies  CBC:  Recent Labs Lab 11/06/16 0518 11/07/16 0909 11/08/16 0849 11/09/16 1527 11/11/16 0537  WBC 12.2* 9.7 10.4 11.4* 10.0  NEUTROABS  --  5.6 6.8  --   --   HGB 9.7* 9.6* 9.2* 9.0* 9.2*  HCT 29.7* 29.8* 28.5* 28.3* 29.2*  MCV 103.8* 103.8* 104.4* 105.6* 106.2*  PLT 148* 142* 146* 132* 123XX123*   Basic Metabolic Panel:  Recent Labs Lab 11/06/16 0518 11/07/16 0909 11/08/16 0849  11/09/16 1527 11/11/16 0537  NA 140 139 140 138 136  K 3.5 3.4* 3.2* 3.6 3.5  CL 102 107 106 109 106  CO2 27 24 27 24 23   GLUCOSE 99 94 89 104* 86  BUN 7 6 <5* <5* 5*  CREATININE 0.60* 0.56* 0.59* 0.68 0.69  CALCIUM 8.3* 8.5* 8.1* 8.0* 8.0*  MG  --  1.5* 1.7 1.7  --    GFR: Estimated Creatinine Clearance: 161.7 mL/min (by C-G formula based on SCr of 0.69 mg/dL). Liver Function Tests:  Recent Labs Lab 11/06/16 0518 11/07/16 0754 11/07/16 0909 11/08/16 0849 11/09/16 1527  AST 125* 116* 122* 105* 115*  ALT 56 52 56 48 51  ALKPHOS 110 99 101 92 92  BILITOT 10.5* 8.5* 8.7* 7.5* 6.2*  PROT 5.8* 5.9* 5.7* 5.5* 6.1*  ALBUMIN 1.8* 1.8* 1.8* 1.6* 1.8*   No results for input(s): LIPASE, AMYLASE in the last 168 hours.  Recent Labs Lab 11/07/16 0909 11/08/16 0849  AMMONIA 53* 37*   Coagulation Profile:  Recent Labs Lab 11/07/16 0754 11/08/16 0417 11/09/16 1527 11/10/16 0439 11/11/16 0537  INR 1.98 1.99 1.93 1.95 1.91   Cardiac Enzymes: No results for input(s): CKTOTAL, CKMB, CKMBINDEX, TROPONINI in the last 168 hours. BNP (last 3 results) No results for input(s): PROBNP in the last 8760 hours. HbA1C: No results for input(s): HGBA1C in the last 72 hours. CBG:  Recent Labs Lab 11/08/16 1655 11/08/16 1955 11/09/16 0044 11/09/16 0911 11/09/16 1125  GLUCAP 105* 95 83 124* 105*   Lipid Profile: No results for input(s): CHOL, HDL, LDLCALC, TRIG, CHOLHDL, LDLDIRECT in the last 72 hours. Thyroid Function Tests: No results for input(s): TSH, T4TOTAL, FREET4, T3FREE, THYROIDAB in the last 72 hours. Anemia Panel: No results for input(s): VITAMINB12, FOLATE, FERRITIN, TIBC, IRON, RETICCTPCT in the last 72 hours.  Sepsis Labs:  Recent Labs Lab 11/07/16 0909 11/08/16 0849 11/09/16 1527 11/11/16 0537  WBC 9.7 10.4 11.4* 10.0    Recent Results (from the past 240 hour(s))  Aerobic/Anaerobic Culture (surgical/deep wound)     Status: None   Collection Time:  11/01/16 11:23 AM  Result Value Ref Range Status   Specimen Description ABSCESS RIGHT THIGH  Final   Special Requests POSTERIOR THIGH  Final   Gram Stain   Final    FEW WBC PRESENT, PREDOMINANTLY PMN RARE GRAM POSITIVE COCCI IN PAIRS    Culture   Final    FEW STREPTOCOCCUS INTERMEDIUS NO ANAEROBES ISOLATED    Report Status 11/07/2016 FINAL  Final   Organism ID, Bacteria STREPTOCOCCUS INTERMEDIUS  Final      Susceptibility   Streptococcus intermedius - MIC*    PENICILLIN <=0.06 SENSITIVE Sensitive     CEFTRIAXONE <=0.12 SENSITIVE Sensitive     ERYTHROMYCIN <=0.12 SENSITIVE Sensitive     LEVOFLOXACIN 0.5 SENSITIVE Sensitive     VANCOMYCIN 0.5 SENSITIVE Sensitive     *  FEW STREPTOCOCCUS INTERMEDIUS  Aerobic/Anaerobic Culture (surgical/deep wound)     Status: None   Collection Time: 11/01/16 11:42 AM  Result Value Ref Range Status   Specimen Description ABSCESS RIGHT BACK  Final   Special Requests   Final    POSTERIOR LATERAL LUMBAR L4 L5 ABSCESS POF CEFATIN AND VANC   Gram Stain   Final    MODERATE WBC PRESENT, PREDOMINANTLY PMN NO ORGANISMS SEEN    Culture   Final    FEW STREPTOCOCCUS INTERMEDIUS NO ANAEROBES ISOLATED    Report Status 11/07/2016 FINAL  Final   Organism ID, Bacteria STREPTOCOCCUS INTERMEDIUS  Final      Susceptibility   Streptococcus intermedius - MIC*    PENICILLIN <=0.06 SENSITIVE Sensitive     CEFTRIAXONE <=0.12 SENSITIVE Sensitive     ERYTHROMYCIN <=0.12 SENSITIVE Sensitive     LEVOFLOXACIN 0.5 SENSITIVE Sensitive     VANCOMYCIN 0.5 SENSITIVE Sensitive     * FEW STREPTOCOCCUS INTERMEDIUS  Aerobic/Anaerobic Culture (surgical/deep wound)     Status: None   Collection Time: 11/01/16 12:01 PM  Result Value Ref Range Status   Specimen Description WOUND BACK  Final   Special Requests LUMBAR L4 L5 FACET SPEC C POF CEFTIN AND VANC  Final   Gram Stain   Final    MODERATE WBC PRESENT, PREDOMINANTLY PMN NO ORGANISMS SEEN    Culture   Final    FEW  STREPTOCOCCUS INTERMEDIUS NO ANAEROBES ISOLATED    Report Status 11/07/2016 FINAL  Final   Organism ID, Bacteria STREPTOCOCCUS INTERMEDIUS  Final      Susceptibility   Streptococcus intermedius - MIC*    PENICILLIN <=0.06 SENSITIVE Sensitive     CEFTRIAXONE <=0.12 SENSITIVE Sensitive     ERYTHROMYCIN <=0.12 SENSITIVE Sensitive     LEVOFLOXACIN 0.5 SENSITIVE Sensitive     VANCOMYCIN 0.5 SENSITIVE Sensitive     * FEW STREPTOCOCCUS INTERMEDIUS  Aerobic/Anaerobic Culture (surgical/deep wound)     Status: None   Collection Time: 11/01/16  1:47 PM  Result Value Ref Range Status   Specimen Description TISSUE  Final   Special Requests   Final    L4 L5 DISC MATERIAL SPECIMEN CUP D PATIENT ON FOLLOWING  CEFTIN AND VANCOMYCIN    Gram Stain   Final    RARE WBC PRESENT, PREDOMINANTLY MONONUCLEAR NO ORGANISMS SEEN    Culture No growth aerobically or anaerobically.  Final   Report Status 11/06/2016 FINAL  Final         Radiology Studies: No results found.      Scheduled Meds: . cefTRIAXone (ROCEPHIN)  IV  2 g Intravenous Q24H  . chlorhexidine  15 mL Mouth Rinse BID  . Chlorhexidine Gluconate Cloth  6 each Topical Daily  . folic acid  1 mg Oral Daily  . lactulose  30 g Oral BID  . mouth rinse  15 mL Mouth Rinse q12n4p  . multivitamin with minerals  1 tablet Oral Daily  . pantoprazole sodium  40 mg Oral Daily  . protein supplement shake  11 oz Oral BID BM  . rifaximin  400 mg Oral Q8H  . risperiDONE  1 mg Oral Q12H  . sodium chloride flush  10-40 mL Intracatheter Q12H  . thiamine  100 mg Oral Daily   Continuous Infusions:    LOS: 13 days      Salimata Christenson, MD [facility] Pager [phone]  If 7PM-7AM, please contact night-coverage [email or web address] Password [email or web address] 11/11/2016, 11:11 AM

## 2016-11-11 NOTE — Progress Notes (Signed)
Peripherally Inserted Central Catheter/Midline Placement  The IV Nurse has discussed with the patient and/or persons authorized to consent for the patient, the purpose of this procedure and the potential benefits and risks involved with this procedure.  The benefits include less needle sticks, lab draws from the catheter, and the patient may be discharged home with the catheter. Risks include, but not limited to, infection, bleeding, blood clot (thrombus formation), and puncture of an artery; nerve damage and irregular heartbeat and possibility to perform a PICC exchange if needed/ordered by physician.  Alternatives to this procedure were also discussed.  Bard Power PICC patient education guide, fact sheet on infection prevention and patient information card has been provided to patient /or left at bedside.    PICC/Midline Placement Documentation        [name] 11/11/2016, 12:11 PM

## 2016-11-11 NOTE — Progress Notes (Signed)
Occupational Therapy Treatment Patient Details Name: [name] MRN: [id number] DOB: [birth year] Today's Date: 11/11/2016    History of present illness 36 yo male developed swelling in Rt leg and foot, and back pain.  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  He was seen by orthopedics, and started on IV abx. 2/04 surgery for decompressive posterior lumbar laminectomy L4-5 and I&D of right thigh abscess  He has hx of Hepatitis C and ETOH.  He was noted to have jaundice and was seen by GI.  He was started on prednisone for alcoholic hepatitis.  He was also seen by ID.  He developed progressive altered mental status due to delirium tremens.  He was transferred to ICU and intubated 2/3; self extubated 2/7.      OT comments  Pt much improved from previous session but continues to require cues throughout all activities to maintain posture and back precautions. Pt able to perform functional mobility with min guard assist. Min assist provided for LB dressing and max assist to don TLSO. Continue to feel pt would be a great [name] candidate to maximize independence and safety with ADL and functional mobility prior to return home. Will continue to follow acutely.   Follow Up Recommendations  [name];Supervision/Assistance - 24 hour    Equipment Recommendations  Other (comment) (TBD)    Recommendations for Other Services      Precautions / Restrictions Precautions Precautions: Back Precaution Booklet Issued: No Precaution Comments: Reviewed back precautions with pt and his girlfriend Required Braces or Orthoses: Spinal Brace Spinal Brace: Applied in sitting position;Thoracolumbosacral orthotic (instructed girlfriend in donning brace) Restrictions Weight Bearing Restrictions: No       Mobility Bed Mobility Overal bed mobility: Needs Assistance Bed Mobility: Rolling;Sidelying to Sit Rolling: Supervision Sidelying to sit:  Supervision;HOB elevated       General bed mobility comments: Cues for log roll technique. HOB slightly elevated with use of bed rails  Transfers Overall transfer level: Needs assistance Equipment used: Rolling walker (2 wheeled) Transfers: Sit to/from Stand Sit to Stand: Min guard;From elevated surface         General transfer comment: cues for hand placement with RW and maintaining back precautions    Balance Overall balance assessment: Needs assistance Sitting-balance support: Feet supported;No upper extremity supported Sitting balance-Leahy Scale: Good     Standing balance support: Bilateral upper extremity supported Standing balance-Leahy Scale: Fair                     ADL Overall ADL's : Needs assistance/impaired Eating/Feeding: Set up;Sitting Eating/Feeding Details (indicate cue type and reason): Pt set up with lunch at end of session             Upper Body Dressing : Maximal assistance;Sitting Upper Body Dressing Details (indicate cue type and reason): to don TLSO Lower Body Dressing: Minimal assistance Lower Body Dressing Details (indicate cue type and reason): Pt able to adjust L sock in sitting with proper technique. Cues for no bending Toilet Transfer: Min guard;Ambulation;RW Toilet Transfer Details (indicate cue type and reason): Simulated by sit to stand from chair with functional mobility         Functional mobility during ADLs: Min guard;Rolling walker General ADL Comments: HR in 120s throughout activity. Cues to maintain back precautions throughout all activities.      Vision  Perception     Praxis      Cognition   Behavior During Therapy: WFL for tasks assessed/performed Overall Cognitive Status: Within Functional Limits for tasks assessed                       Extremity/Trunk Assessment               Exercises    Shoulder Instructions       General Comments      Pertinent  Vitals/ Pain       Pain Assessment: 0-10 Pain Score: 7  Pain Location: R posterior thigh Pain Descriptors / Indicators: Sore Pain Intervention(s): Monitored during session;Repositioned;RN gave pain meds during session  Home Living                                          Prior Functioning/Environment              Frequency  Min 2X/week        Progress Toward Goals  OT Goals(current goals can now be found in the care plan section)  Progress towards OT goals: Progressing toward goals  Acute Rehab OT Goals 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 OT Goal Formulation: With patient  Plan Discharge plan remains appropriate    Co-evaluation    PT/OT/SLP Co-Evaluation/Treatment: Yes Reason for Co-Treatment: Complexity of the patient's impairments (multi-system involvement) PT goals addressed during session: Mobility/safety with mobility OT goals addressed during session: ADL's and self-care      End of Session Equipment Utilized During Treatment: Back brace;Rolling walker   Activity Tolerance Patient tolerated treatment well   Patient Left in chair;with call bell/phone within reach;with family/visitor present   Nurse Communication Mobility status;Patient requests pain meds        Time: 1357-1420 OT Time Calculation (min): 23 min  Charges: OT General Charges $OT Visit: 1 Procedure OT Treatments $Self Care/Home Management : 8-22 mins  Binnie Kand M.S., OTR/L Pager: [phone]  11/11/2016, 2:45 PM

## 2016-11-11 NOTE — Progress Notes (Signed)
Rehab admissions - Currently rehab beds are full.  I will re-open the case with [employer] and request acute inpatient rehab admission.  I will follow up tomorrow after I hear back from insurance carrier and will update all regarding bed availability.  Call me for questions.  CK:[id number]

## 2016-11-12 ENCOUNTER — Encounter (HOSPITAL_COMMUNITY)
Admission: EM | Disposition: A | Discharge status: Rehabilitation Facility | Payer: Self-pay | Source: Home / Self Care | Attending: Internal Medicine

## 2016-11-12 ENCOUNTER — Encounter (HOSPITAL_COMMUNITY): Payer: Self-pay | Admitting: Certified Registered Nurse Anesthetist

## 2016-11-12 ENCOUNTER — Inpatient Hospital Stay (HOSPITAL_COMMUNITY): Payer: BLUE CROSS/BLUE SHIELD | Admitting: Certified Registered Nurse Anesthetist

## 2016-11-12 DIAGNOSIS — B999 Unspecified infectious disease: Secondary | ICD-10-CM | POA: Diagnosis present

## 2016-11-12 HISTORY — PX: LUMBAR WOUND DEBRIDEMENT: SHX1988

## 2016-11-12 LAB — COMPREHENSIVE METABOLIC PANEL
ALT: 50 U/L (ref 17–63)
ANION GAP: 6 (ref 5–15)
AST: 100 U/L — ABNORMAL HIGH (ref 15–41)
Albumin: 1.8 g/dL — ABNORMAL LOW (ref 3.5–5.0)
Alkaline Phosphatase: 84 U/L (ref 38–126)
BUN: 5 mg/dL — ABNORMAL LOW (ref 6–20)
CHLORIDE: 108 mmol/L (ref 101–111)
CO2: 23 mmol/L (ref 22–32)
Calcium: 8.1 mg/dL — ABNORMAL LOW (ref 8.9–10.3)
Creatinine, Ser: 0.59 mg/dL — ABNORMAL LOW (ref 0.61–1.24)
GFR calc non Af Amer: >60 mL/min (ref >60)
Glucose, Bld: 87 mg/dL (ref 65–99)
POTASSIUM: 3.7 mmol/L (ref 3.5–5.1)
SODIUM: 137 mmol/L (ref 135–145)
Total Bilirubin: 5.5 mg/dL — ABNORMAL HIGH (ref 0.3–1.2)
Total Protein: 6 g/dL — ABNORMAL LOW (ref 6.5–8.1)

## 2016-11-12 LAB — CBC
HCT: 28.8 % — ABNORMAL LOW (ref 39.0–52.0)
Hemoglobin: 9.3 g/dL — ABNORMAL LOW (ref 13.0–17.0)
MCH: 34.2 pg — AB (ref 26.0–34.0)
MCHC: 32.3 g/dL (ref 30.0–36.0)
MCV: 105.9 fL — ABNORMAL HIGH (ref 78.0–100.0)
PLATELETS: 85 10*3/uL — AB (ref 150–400)
RBC: 2.72 MIL/uL — AB (ref 4.22–5.81)
RDW: 17.5 % — ABNORMAL HIGH (ref 11.5–15.5)
WBC: 7.1 10*3/uL (ref 4.0–10.5)

## 2016-11-12 LAB — PROTIME-INR
INR: 2
Prothrombin Time: 23 seconds — ABNORMAL HIGH (ref 11.4–15.2)

## 2016-11-12 LAB — GLUCOSE, CAPILLARY: GLUCOSE-CAPILLARY: 88 mg/dL (ref 65–99)

## 2016-11-12 SURGERY — LUMBAR WOUND DEBRIDEMENT
Anesthesia: General | Site: Spine Lumbar

## 2016-11-12 MED ORDER — SUGAMMADEX SODIUM 200 MG/2ML IV SOLN
INTRAVENOUS | Status: DC | PRN
  Administered 2016-11-12: 200 mg via INTRAVENOUS

## 2016-11-12 MED ORDER — DEXAMETHASONE SODIUM PHOSPHATE 10 MG/ML IJ SOLN
INTRAMUSCULAR | Status: DC | PRN
  Administered 2016-11-12: 10 mg via INTRAVENOUS

## 2016-11-12 MED ORDER — FENTANYL CITRATE (PF) 100 MCG/2ML IJ SOLN
INTRAMUSCULAR | Status: DC | PRN
  Administered 2016-11-12: 100 ug via INTRAVENOUS

## 2016-11-12 MED ORDER — ONDANSETRON HCL 4 MG/2ML IJ SOLN: 4.0000 mg | INTRAMUSCULAR | Status: DC | PRN

## 2016-11-12 MED ORDER — PROPOFOL 10 MG/ML IV BOLUS
INTRAVENOUS | Status: DC | PRN
  Administered 2016-11-12: 200 mg via INTRAVENOUS

## 2016-11-12 MED ORDER — METHOCARBAMOL 500 MG PO TABS: 500.0000 mg | ORAL_TABLET | Freq: Four times a day (QID) | ORAL | Status: DC | PRN

## 2016-11-12 MED ORDER — MENTHOL 3 MG MT LOZG: 1.0000 | LOZENGE | OROMUCOSAL | Status: DC | PRN

## 2016-11-12 MED ORDER — MEPERIDINE HCL 25 MG/ML IJ SOLN: 6.2500 mg | INTRAMUSCULAR | Status: DC | PRN

## 2016-11-12 MED ORDER — SODIUM CHLORIDE 0.9% FLUSH
3.0000 mL | Freq: Two times a day (BID) | INTRAVENOUS | Status: DC
  Administered 2016-11-12 – 2016-11-14 (×4): 3 mL via INTRAVENOUS

## 2016-11-12 MED ORDER — PROPOFOL 10 MG/ML IV BOLUS
INTRAVENOUS | Status: AC
  Filled 2016-11-12: qty 20

## 2016-11-12 MED ORDER — CEFAZOLIN SODIUM 1 G IJ SOLR
INTRAMUSCULAR | Status: DC | PRN
  Administered 2016-11-12: 2 g via INTRAMUSCULAR

## 2016-11-12 MED ORDER — MIDAZOLAM HCL 2 MG/2ML IJ SOLN
INTRAMUSCULAR | Status: AC
  Filled 2016-11-12: qty 2

## 2016-11-12 MED ORDER — SODIUM CHLORIDE 0.9 % IR SOLN
Status: DC | PRN
  Administered 2016-11-12 (×2): 3000 mL
  Administered 2016-11-12: 1000 mL

## 2016-11-12 MED ORDER — EPHEDRINE SULFATE 50 MG/ML IJ SOLN
INTRAMUSCULAR | Status: DC | PRN
  Administered 2016-11-12: 10 mg via INTRAVENOUS
  Administered 2016-11-12: 20 mg via INTRAVENOUS
  Administered 2016-11-12 (×2): 10 mg via INTRAVENOUS

## 2016-11-12 MED ORDER — LACTATED RINGERS IV SOLN
INTRAVENOUS | Status: DC
  Administered 2016-11-12: 15:00:00 via INTRAVENOUS

## 2016-11-12 MED ORDER — ADULT MULTIVITAMIN W/MINERALS CH
1.0000 | ORAL_TABLET | Freq: Every day | ORAL | Status: DC
  Administered 2016-11-13 – 2016-11-16 (×4): 1 via ORAL
  Filled 2016-11-12 (×4): qty 1

## 2016-11-12 MED ORDER — PROMETHAZINE HCL 25 MG/ML IJ SOLN: 6.2500 mg | INTRAMUSCULAR | Status: DC | PRN

## 2016-11-12 MED ORDER — VITAMIN B-1 100 MG PO TABS
100.0000 mg | ORAL_TABLET | Freq: Every day | ORAL | Status: DC
  Administered 2016-11-13 – 2016-11-16 (×4): 100 mg via ORAL
  Filled 2016-11-12 (×4): qty 1

## 2016-11-12 MED ORDER — HYDROMORPHONE HCL 1 MG/ML IJ SOLN
INTRAMUSCULAR | Status: AC
  Filled 2016-11-12: qty 1

## 2016-11-12 MED ORDER — PHENYLEPHRINE HCL 10 MG/ML IJ SOLN
INTRAMUSCULAR | Status: DC | PRN
  Administered 2016-11-12: 100 ug/min via INTRAVENOUS

## 2016-11-12 MED ORDER — 0.9 % SODIUM CHLORIDE (POUR BTL) OPTIME
TOPICAL | Status: DC | PRN
  Administered 2016-11-12: 1000 mL

## 2016-11-12 MED ORDER — LIDOCAINE HCL (CARDIAC) 20 MG/ML IV SOLN
INTRAVENOUS | Status: DC | PRN
  Administered 2016-11-12: 100 mg via INTRATRACHEAL

## 2016-11-12 MED ORDER — LACTATED RINGERS IV SOLN
INTRAVENOUS | Status: DC
  Administered 2016-11-12 (×2): via INTRAVENOUS

## 2016-11-12 MED ORDER — SODIUM CHLORIDE 0.9% FLUSH: 3.0000 mL | INTRAVENOUS | Status: DC | PRN

## 2016-11-12 MED ORDER — HALOPERIDOL LACTATE 5 MG/ML IJ SOLN: 0.5000 mg | Freq: Four times a day (QID) | INTRAMUSCULAR | Status: DC | PRN

## 2016-11-12 MED ORDER — PREMIER PROTEIN SHAKE
11.0000 [oz_av] | Freq: Two times a day (BID) | ORAL | Status: DC
  Administered 2016-11-13 – 2016-11-15 (×5): 11 [oz_av] via ORAL
  Filled 2016-11-12 (×10): qty 325.31

## 2016-11-12 MED ORDER — LIDOCAINE 2% (20 MG/ML) 5 ML SYRINGE
INTRAMUSCULAR | Status: AC
  Filled 2016-11-12: qty 5

## 2016-11-12 MED ORDER — HYDROMORPHONE HCL 1 MG/ML IJ SOLN
0.2500 mg | INTRAMUSCULAR | Status: DC | PRN
  Administered 2016-11-12: 0.5 mg via INTRAVENOUS

## 2016-11-12 MED ORDER — ROCURONIUM BROMIDE 100 MG/10ML IV SOLN
INTRAVENOUS | Status: DC | PRN
  Administered 2016-11-12: 30 mg via INTRAVENOUS
  Administered 2016-11-12: 20 mg via INTRAVENOUS

## 2016-11-12 MED ORDER — ONDANSETRON HCL 4 MG/2ML IJ SOLN
INTRAMUSCULAR | Status: AC
  Filled 2016-11-12: qty 2

## 2016-11-12 MED ORDER — PANTOPRAZOLE SODIUM 40 MG PO PACK
40.0000 mg | PACK | Freq: Every day | ORAL | Status: DC
  Administered 2016-11-12 – 2016-11-16 (×5): 40 mg via ORAL
  Filled 2016-11-12 (×3): qty 20

## 2016-11-12 MED ORDER — GLYCOPYRROLATE 0.2 MG/ML IJ SOLN
INTRAMUSCULAR | Status: DC | PRN
  Administered 2016-11-12: 0.2 mg via INTRAVENOUS

## 2016-11-12 MED ORDER — OXYCODONE HCL 5 MG PO TABS
10.0000 mg | ORAL_TABLET | ORAL | Status: DC | PRN
  Administered 2016-11-13 – 2016-11-16 (×10): 10 mg via ORAL
  Filled 2016-11-12 (×10): qty 2

## 2016-11-12 MED ORDER — TRAMADOL HCL 50 MG PO TABS
25.0000 mg | ORAL_TABLET | Freq: Four times a day (QID) | ORAL | Status: DC | PRN
Start: 2016-11-12 — End: 2016-11-16
  Administered 2016-11-14: 25 mg via ORAL
  Filled 2016-11-12: qty 1

## 2016-11-12 MED ORDER — FENTANYL CITRATE (PF) 100 MCG/2ML IJ SOLN
INTRAMUSCULAR | Status: AC
  Filled 2016-11-12: qty 2

## 2016-11-12 MED ORDER — DEXTROSE 5 % IV SOLN
2.0000 g | INTRAVENOUS | Status: DC
  Administered 2016-11-12 – 2016-11-16 (×5): 2 g via INTRAVENOUS
  Filled 2016-11-12 (×5): qty 2

## 2016-11-12 MED ORDER — METHOCARBAMOL 1000 MG/10ML IJ SOLN
500.0000 mg | Freq: Four times a day (QID) | INTRAVENOUS | Status: DC | PRN
  Filled 2016-11-12: qty 5

## 2016-11-12 MED ORDER — LACTULOSE 10 GM/15ML PO SOLN
30.0000 g | Freq: Two times a day (BID) | ORAL | Status: DC
  Administered 2016-11-12 – 2016-11-16 (×8): 30 g via ORAL
  Filled 2016-11-12 (×8): qty 45

## 2016-11-12 MED ORDER — SODIUM CHLORIDE 0.9 % IV SOLN: 250.0000 mL | INTRAVENOUS | Status: DC

## 2016-11-12 MED ORDER — ARTIFICIAL TEARS OP OINT
TOPICAL_OINTMENT | OPHTHALMIC | Status: AC
  Filled 2016-11-12: qty 3.5

## 2016-11-12 MED ORDER — RIFAXIMIN 200 MG PO TABS
400.0000 mg | ORAL_TABLET | Freq: Three times a day (TID) | ORAL | Status: DC
  Administered 2016-11-12 – 2016-11-14 (×6): 400 mg via ORAL
  Filled 2016-11-12 (×7): qty 2

## 2016-11-12 MED ORDER — CEFAZOLIN IN D5W 1 GM/50ML IV SOLN
1.0000 g | Freq: Three times a day (TID) | INTRAVENOUS | Status: AC
  Administered 2016-11-12 – 2016-11-13 (×2): 1 g via INTRAVENOUS
  Filled 2016-11-12 (×3): qty 50

## 2016-11-12 MED ORDER — ONDANSETRON HCL 4 MG/2ML IJ SOLN
INTRAMUSCULAR | Status: DC | PRN
  Administered 2016-11-12: 4 mg via INTRAVENOUS

## 2016-11-12 MED ORDER — ONDANSETRON HCL 4 MG PO TABS: 4.0000 mg | ORAL_TABLET | Freq: Four times a day (QID) | ORAL | Status: DC | PRN

## 2016-11-12 MED ORDER — FOLIC ACID 1 MG PO TABS
1.0000 mg | ORAL_TABLET | Freq: Every day | ORAL | Status: DC
  Administered 2016-11-13 – 2016-11-16 (×4): 1 mg via ORAL
  Filled 2016-11-12 (×4): qty 1

## 2016-11-12 MED ORDER — LABETALOL HCL 5 MG/ML IV SOLN: 10.0000 mg | INTRAVENOUS | Status: DC | PRN

## 2016-11-12 MED ORDER — BUPIVACAINE HCL (PF) 0.25 % IJ SOLN
INTRAMUSCULAR | Status: AC
  Filled 2016-11-12: qty 30

## 2016-11-12 MED ORDER — MIDAZOLAM HCL 2 MG/2ML IJ SOLN
INTRAMUSCULAR | Status: DC | PRN
  Administered 2016-11-12 (×2): 1 mg via INTRAVENOUS

## 2016-11-12 MED ORDER — RISPERIDONE 1 MG PO TABS
1.0000 mg | ORAL_TABLET | Freq: Two times a day (BID) | ORAL | Status: DC
  Administered 2016-11-12 – 2016-11-15 (×6): 1 mg via ORAL
  Filled 2016-11-12 (×6): qty 1

## 2016-11-12 MED ORDER — PHENYLEPHRINE 40 MCG/ML (10ML) SYRINGE FOR IV PUSH (FOR BLOOD PRESSURE SUPPORT)
PREFILLED_SYRINGE | INTRAVENOUS | Status: AC
  Filled 2016-11-12: qty 10

## 2016-11-12 MED ORDER — ROCURONIUM BROMIDE 50 MG/5ML IV SOSY
PREFILLED_SYRINGE | INTRAVENOUS | Status: AC
  Filled 2016-11-12: qty 5

## 2016-11-12 MED ORDER — PHENYLEPHRINE HCL 10 MG/ML IJ SOLN
INTRAMUSCULAR | Status: DC | PRN
  Administered 2016-11-12: 200 ug via INTRAVENOUS
  Administered 2016-11-12: 120 ug via INTRAVENOUS
  Administered 2016-11-12 (×2): 200 ug via INTRAVENOUS
  Administered 2016-11-12: 160 ug via INTRAVENOUS
  Administered 2016-11-12: 200 ug via INTRAVENOUS
  Administered 2016-11-12: 120 ug via INTRAVENOUS

## 2016-11-12 MED ORDER — EPINEPHRINE PF 1 MG/ML IJ SOLN
INTRAMUSCULAR | Status: AC
  Filled 2016-11-12: qty 1

## 2016-11-12 MED ORDER — PHENOL 1.4 % MT LIQD: 1.0000 | OROMUCOSAL | Status: DC | PRN

## 2016-11-12 MED ORDER — ONDANSETRON HCL 4 MG/2ML IJ SOLN: 4.0000 mg | Freq: Four times a day (QID) | INTRAMUSCULAR | Status: DC | PRN

## 2016-11-12 MED ORDER — PHENYLEPHRINE 40 MCG/ML (10ML) SYRINGE FOR IV PUSH (FOR BLOOD PRESSURE SUPPORT)
PREFILLED_SYRINGE | INTRAVENOUS | Status: AC
  Filled 2016-11-12: qty 20

## 2016-11-12 MED ORDER — ALBUTEROL SULFATE HFA 108 (90 BASE) MCG/ACT IN AERS
INHALATION_SPRAY | RESPIRATORY_TRACT | Status: AC
  Filled 2016-11-12: qty 6.7

## 2016-11-12 MED ORDER — MORPHINE SULFATE (PF) 2 MG/ML IV SOLN
1.0000 mg | INTRAVENOUS | Status: DC | PRN
  Administered 2016-11-12: 4 mg via INTRAVENOUS
  Administered 2016-11-12 – 2016-11-16 (×3): 2 mg via INTRAVENOUS
  Filled 2016-11-12: qty 2
  Filled 2016-11-12 (×3): qty 1

## 2016-11-12 SURGICAL SUPPLY — 63 items
APL SKNCLS STERI-STRIP NONHPOA (GAUZE/BANDAGES/DRESSINGS) ×1
BENZOIN TINCTURE PRP APPL 2/3 (GAUZE/BANDAGES/DRESSINGS) ×2 IMPLANT
BUR EGG ELITE 4.0 (BURR) IMPLANT
BUR EGG ELITE 4.0MM (BURR)
CANISTER SUCTION 2500CC (MISCELLANEOUS) ×3 IMPLANT
CANISTER WOUND CARE 500ML ATS (WOUND CARE) ×2 IMPLANT
CLOSURE WOUND 1/2 X4 (GAUZE/BANDAGES/DRESSINGS)
CORDS BIPOLAR (ELECTRODE) ×3 IMPLANT
COVER SURGICAL LIGHT HANDLE (MISCELLANEOUS) ×3 IMPLANT
DRAPE POUCH INSTRU U-SHP 10X18 (DRAPES) ×3 IMPLANT
DRAPE PROXIMA HALF (DRAPES) ×3 IMPLANT
DRAPE SURG 17X23 STRL (DRAPES) ×3 IMPLANT
DRAPE U-SHAPE 47X51 STRL (DRAPES) ×3 IMPLANT
DRSG AQUACEL AG ADV 3.5X10 (GAUZE/BANDAGES/DRESSINGS) ×1 IMPLANT
DRSG VAC ATS MED SENSATRAC (GAUZE/BANDAGES/DRESSINGS) ×2 IMPLANT
DRSG VAC ATS SM SENSATRAC (GAUZE/BANDAGES/DRESSINGS) ×2 IMPLANT
DURAPREP 26ML APPLICATOR (WOUND CARE) ×3 IMPLANT
ELECT BLADE 4.0 EZ CLEAN MEGAD (MISCELLANEOUS)
ELECT CAUTERY BLADE 6.4 (BLADE) ×1 IMPLANT
ELECT PENCIL ROCKER SW 15FT (MISCELLANEOUS) ×3 IMPLANT
ELECT REM PT RETURN 9FT ADLT (ELECTROSURGICAL) ×3
ELECTRODE BLDE 4.0 EZ CLN MEGD (MISCELLANEOUS) IMPLANT
ELECTRODE REM PT RTRN 9FT ADLT (ELECTROSURGICAL) ×1 IMPLANT
EVACUATOR 1/8 PVC DRAIN (DRAIN) IMPLANT
GLOVE BIO SURGEON STRL SZ 6.5 (GLOVE) ×2 IMPLANT
GLOVE BIO SURGEONS STRL SZ 6.5 (GLOVE) ×1
GLOVE BIOGEL PI IND STRL 6.5 (GLOVE) ×1 IMPLANT
GLOVE BIOGEL PI IND STRL 8.5 (GLOVE) ×1 IMPLANT
GLOVE BIOGEL PI INDICATOR 6.5 (GLOVE) ×2
GLOVE BIOGEL PI INDICATOR 8.5 (GLOVE) ×2
GLOVE SS BIOGEL STRL SZ 8.5 (GLOVE) ×1 IMPLANT
GLOVE SUPERSENSE BIOGEL SZ 8.5 (GLOVE) ×2
GOWN STRL REUS W/ TWL LRG LVL3 (GOWN DISPOSABLE) ×1 IMPLANT
GOWN STRL REUS W/TWL 2XL LVL3 (GOWN DISPOSABLE) ×6 IMPLANT
GOWN STRL REUS W/TWL LRG LVL3 (GOWN DISPOSABLE) ×3
KIT BASIN OR (CUSTOM PROCEDURE TRAY) ×3 IMPLANT
KIT ROOM TURNOVER OR (KITS) ×3 IMPLANT
NDL SPNL 18GX3.5 QUINCKE PK (NEEDLE) ×2 IMPLANT
NEEDLE 22X1 1/2 (OR ONLY) (NEEDLE) ×1 IMPLANT
NEEDLE SPNL 18GX3.5 QUINCKE PK (NEEDLE) IMPLANT
NS IRRIG 1000ML POUR BTL (IV SOLUTION) ×3 IMPLANT
PACK LAMINECTOMY ORTHO (CUSTOM PROCEDURE TRAY) ×3 IMPLANT
PACK UNIVERSAL I (CUSTOM PROCEDURE TRAY) ×3 IMPLANT
PAD ARMBOARD 7.5X6 YLW CONV (MISCELLANEOUS) ×6 IMPLANT
PATTIES SURGICAL .5 X.5 (GAUZE/BANDAGES/DRESSINGS) IMPLANT
PATTIES SURGICAL .5 X1 (DISPOSABLE) IMPLANT
SPONGE SURGIFOAM ABS GEL 100 (HEMOSTASIS) IMPLANT
STRIP CLOSURE SKIN 1/2X4 (GAUZE/BANDAGES/DRESSINGS) ×1 IMPLANT
SURGIFLO W/THROMBIN 8M KIT (HEMOSTASIS) IMPLANT
SUT BONE WAX W31G (SUTURE) ×3 IMPLANT
SUT MON AB 3-0 SH 27 (SUTURE)
SUT MON AB 3-0 SH27 (SUTURE) ×1 IMPLANT
SUT VIC AB 0 CT1 27 (SUTURE)
SUT VIC AB 0 CT1 27XBRD ANBCTR (SUTURE) ×1 IMPLANT
SUT VIC AB 1 CTX 36 (SUTURE)
SUT VIC AB 1 CTX36XBRD ANBCTR (SUTURE) ×2 IMPLANT
SUT VIC AB 2-0 CT1 18 (SUTURE) ×1 IMPLANT
SYR BULB IRRIGATION 50ML (SYRINGE) ×3 IMPLANT
SYR CONTROL 10ML LL (SYRINGE) ×3 IMPLANT
TOWEL OR 17X24 6PK STRL BLUE (TOWEL DISPOSABLE) ×3 IMPLANT
TOWEL OR 17X26 10 PK STRL BLUE (TOWEL DISPOSABLE) ×3 IMPLANT
WATER STERILE IRR 1000ML POUR (IV SOLUTION) ×1 IMPLANT
YANKAUER SUCT BULB TIP NO VENT (SUCTIONS) ×3 IMPLANT

## 2016-11-12 NOTE — Progress Notes (Signed)
PROGRESS NOTE  [name]  [id number] DOB: 1980/11/11  DOA: 10/29/2016 PCP: Nicholes Rough, PA-C   Brief Narrative:  36 year old male with a PMH of chronic hepatitis C, alcoholic hepatitis, fatty liver, ongoing occasional alcohol intake, remote IVDA, seen by OP pain MD for increasing back pain, right lower extremity cellulitis and edema, ruled out DVT, presented back to provide her when stat MRI confirmed L4-5 septic right facet arthritis and abscess within the paraspinal musculature. Due to altered mental status from delirium tremens, he had to be transferred to ICU, intubated and was under CCM care. Orthopedics, [facility], infectious disease assisted with management. He underwent back and right lower extremity surgery. Extubated and stabilized. Care transferred over to [facility] on 11/06/16.   Assessment & Plan:   Principal Problem:   Epidural abscess Active Problems:   Liver failure (HCC)   Hyponatremia   Cellulitis of right lower extremity   EtOH dependence (HCC)   Thrombocytopenia (HCC)   Cirrhosis (HCC)   Paraspinal abscess (HCC)   Chronic hepatitis C without hepatic coma (HCC)   Hepatitis   Abnormal transaminases   Alcoholic hepatitis without ascites   ETOH abuse   Abscess of right lower extremity   Cellulitis and abscess of right lower extremity   Hypernatremia   Delirium tremens (HCC)   Infection of lumbar spine (HCC)   Pyogenic arthritis of multiple sites [facility])   Traumatic compartment syndrome of other sites [facility])   Alcoholic liver failure ([place])   Traumatic compartment syndrome of lower extremity ([place])   Alcoholic cirrhosis of liver without ascites ([place])   Goals of care, counseling/discussion   Palliative care by specialist   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 empirically  on IV vancomycin and Zosyn was changed to cefepime, ID recommending total 6 weeks of IV Rocephin, history of remote IV drug  abuse, more than 15 years ago, discussed with ID, patient and his mother, at this point plan for [name]/[name], and mother will provide care at home with 24/7 supervision from her multiple family members. - Status post Decompressive posterior lumbar laminectomy level IV-V by orthopedics on 11/01/16 - Post surgery orthopedics recommended mobilization with PT, upper therapy and TLSO brace. PT and OT evaluated and recommended [name].  Right posterior deep thigh abscess/pyomyositis:  - Status post I&D by orthopedics on 11/01/16.  - Patient with increased nonpurulent drainage from the wound over last 48 hours, orthopedic input greatly appreciated, plan to go back to our today for IND and wound VAC as concern for potential abscess.  Acute hepatic failure/Jaundice/hepatitis C/fatty liver:  - History of hepatitis C and fatty liver. History of severe alcoholic hepatitis [id number]. Never followed through with planned treatment with [facility] after failing some unspecified antiviral in 2016. MELD=24 & discriminant function 66 consistent with recurrent alcoholic hepatitis. GI was consulted. Patient was started on prednisone 40 mg daily 30 days which was switched to IV Solu-Medrol while he was intubated, for alcoholic hepatitis. Steroids were then stopped on 11/02/16 due to ongoing infection. As per GI, alcoholic hepatitis typically takes 1-3 months to resolve and he is at increased risk for hepatic decompensation, morbidity and mortality from this and this risk will not likely change for 1-2 months. - Avoid alcohol completely and acetaminophen.  - He was also placed on lactulose and rifaximin. LFTs improving. - He has to follow up with his primary GI doctor (Dr. Alice Reichert) in 3-4 weeks from discharge.  Alcohol abuse and likely  dependence:  - On 10/31/16, patient developed progressive altered mental status due to delirium tremens and he was transferred to ICU. He was started on Precedex drip and multivitamins.  Coagulopathy - Secondary  to acute liver failure. No bleeding reported. Required vitamin K and FFP before surgery  Thrombocytopenia - Secondary to liver disease. Treating  Hyponatremia - Resolved.  Acute hypoxic respiratory failure secondary to unable to protect airways due to DTs:  - Transfer to ICU. Intubated 11/01/16. CCM managed. Patient self extubated on 11/04/16. Maintaining airway.  Acute encephalopathy secondary to delirium tremens/hepatic encephalopathy:  - Remains on rifaximin and lactulose. [facility] assisted with management. DTs improved but patient remained confused. His mental status changes were felt to be multifactorial secondary to medications in addition to hepatic encephalopathy and ongoing infections. Sedative medications were minimized. Ammonia has normalized. Remains on risperidone 1 MG twice a day. Sleep hygiene. Continue to monitor closely. Appears to be improving , currently awake and appropriate with good cognition and coherence  Hypoglycemia: - Resolved. Speech therapy evaluated and recommended a regular consistency diet.  Hypokalemia and hypomagnesemia - Replace and follow as needed.  Anemia - Stable.  Goals of care:  As per chart review, patient's brother indicated that patient's significant other is not his wife and patient's mother should be in charge of making healthcare decisions, CODE STATUS and short-term versus long-term goals care. PMT's onboard to further evaluate case and assist.   DVT prophylaxis: SCDs(unable to do any chemical prophylaxis given coagulopathy and thrombocytopenia) Code Status: Full Family Communication: Discussed with mother via phone 12/13 Disposition Plan: Pending clinical course and improvement,   Consultants:   Orthopedics  [facility]  Infectious disease  CCM  Rehabilitation M.D.  Procedures:   Intubated for mechanical ventilation 11/01/16  Central line 11/01/16-Discontinued   I&D of right posterior deep deep thigh abscess by orthopedics  on 11/01/16  Decompressive posterior lumbar laminectomy level IV-V by orthopedics on 11/01/16  NG tube feeding-discontinued.  Arterial line-discontinued  Foley catheter  Antimicrobials:   IV Rocephin  Rifaximin     Subjective: Seen this morning.no acute events overnight, reports pain in right lower thigh is controlled , no nausea, no vomiting, no chest pain or shortness of breath Objective:  Vitals:   11/11/16 1426 11/11/16 2138 11/12/16 0539 11/12/16 0900  BP: (!) 110/52 124/66 133/65 121/64  Pulse:  82 77 77  Resp: 16 18 20    Temp: 98.9 F (37.2 C) 98.8 F (37.1 C) 98.5 F (36.9 C) 98.1 F (36.7 C)  TempSrc: Oral Oral Oral Oral  SpO2: 98% 98% 95% 98%  Weight:   110 kg (242 lb 8.1 oz)   Height:        Intake/Output Summary (Last 24 hours) at 11/12/16 1211 Last data filed at 11/12/16 1029  Gross per 24 hour  Intake               50 ml  Output             2525 ml  Net            -2475 ml   Filed Weights   11/10/16 0339 11/11/16 0526 11/12/16 0539  Weight: 108.9 kg (240 lb 1.6 oz) 108.9 kg (240 lb) 110 kg (242 lb 8.1 oz)    Examination:  General exam: Pleasant young male lying propped up in bed.  Respiratory system: Clear to auscultation. Respiratory effort normal. Cardiovascular system: S1 & S2 heard, RRR. No JVD, murmurs, rubs, gallops or clicks.  No pedal edema.  Gastrointestinal system: Abdomen is nondistended, soft and nontender. No organomegaly or masses felt. Normal bowel sounds heard. Foley catheter +. Central nervous system: Alert and oriented only to self. No focal neurological deficits. Extremities: Symmetric 5 x 5 power. Bilateral wrist restraints and mittens. Skin: No rashes, lesions or ulcers. Lumbar spine surgical site dressing clean and dry. Right posterior thigh With nonpurulent discharge, amount patient's to be less today . Psychiatry: Awake, alert and appropriate    Data Reviewed: I have personally reviewed following labs and imaging  studies  CBC:  Recent Labs Lab 11/07/16 0909 11/08/16 0849 11/09/16 1527 11/11/16 0537 11/12/16 0800  WBC 9.7 10.4 11.4* 10.0 7.1  NEUTROABS 5.6 6.8  --   --   --   HGB 9.6* 9.2* 9.0* 9.2* 9.3*  HCT 29.8* 28.5* 28.3* 29.2* 28.8*  MCV 103.8* 104.4* 105.6* 106.2* 105.9*  PLT 142* 146* 132* 108* 85*   Basic Metabolic Panel:  Recent Labs Lab 11/07/16 0909 11/08/16 0849 11/09/16 1527 11/11/16 0537 11/12/16 0800  NA 139 140 138 136 137  K 3.4* 3.2* 3.6 3.5 3.7  CL 107 106 109 106 108  CO2 24 27 24 23 23   GLUCOSE 94 89 104* 86 87  BUN 6 <5* <5* 5* 5*  CREATININE 0.56* 0.59* 0.68 0.69 0.59*  CALCIUM 8.5* 8.1* 8.0* 8.0* 8.1*  MG 1.5* 1.7 1.7  --   --    GFR: Estimated Creatinine Clearance: 162.6 mL/min (by C-G formula based on SCr of 0.59 mg/dL (L)). Liver Function Tests:  Recent Labs Lab 11/07/16 0754 11/07/16 0909 11/08/16 0849 11/09/16 1527 11/12/16 0800  AST 116* 122* 105* 115* 100*  ALT 52 56 48 51 50  ALKPHOS 99 101 92 92 84  BILITOT 8.5* 8.7* 7.5* 6.2* 5.5*  PROT 5.9* 5.7* 5.5* 6.1* 6.0*  ALBUMIN 1.8* 1.8* 1.6* 1.8* 1.8*   No results for input(s): LIPASE, AMYLASE in the last 168 hours.  Recent Labs Lab 11/07/16 0909 11/08/16 0849  AMMONIA 53* 37*   Coagulation Profile:  Recent Labs Lab 11/08/16 0417 11/09/16 1527 11/10/16 0439 11/11/16 0537 11/12/16 0500  INR 1.99 1.93 1.95 1.91 2.00   Cardiac Enzymes: No results for input(s): CKTOTAL, CKMB, CKMBINDEX, TROPONINI in the last 168 hours. BNP (last 3 results) No results for input(s): PROBNP in the last 8760 hours. HbA1C: No results for input(s): HGBA1C in the last 72 hours. CBG:  Recent Labs Lab 11/08/16 1655 11/08/16 1955 11/09/16 0044 11/09/16 0911 11/09/16 1125  GLUCAP 105* 95 83 124* 105*   Lipid Profile: No results for input(s): CHOL, HDL, LDLCALC, TRIG, CHOLHDL, LDLDIRECT in the last 72 hours. Thyroid Function Tests: No results for input(s): TSH, T4TOTAL, FREET4, T3FREE,  THYROIDAB in the last 72 hours. Anemia Panel: No results for input(s): VITAMINB12, FOLATE, FERRITIN, TIBC, IRON, RETICCTPCT in the last 72 hours.  Sepsis Labs:  Recent Labs Lab 11/08/16 0849 11/09/16 1527 11/11/16 0537 11/12/16 0800  WBC 10.4 11.4* 10.0 7.1    No results found for this or any previous visit (from the past 240 hour(s)).       Radiology Studies: No results found.      Scheduled Meds: . [MAR Hold] cefTRIAXone (ROCEPHIN)  IV  2 g Intravenous Q24H  . [MAR Hold] chlorhexidine  15 mL Mouth Rinse BID  . [MAR Hold] Chlorhexidine Gluconate Cloth  6 each Topical Daily  . [MAR Hold] folic acid  1 mg Oral Daily  . [MAR Hold] lactulose  30 g  Oral BID  . [MAR Hold] mouth rinse  15 mL Mouth Rinse q12n4p  . [MAR Hold] multivitamin with minerals  1 tablet Oral Daily  . [MAR Hold] pantoprazole sodium  40 mg Oral Daily  . [MAR Hold] protein supplement shake  11 oz Oral BID BM  . [MAR Hold] rifaximin  400 mg Oral Q8H  . [MAR Hold] risperiDONE  1 mg Oral Q12H  . [MAR Hold] sodium chloride flush  10-40 mL Intracatheter Q12H  . [MAR Hold] sodium chloride flush  10-40 mL Intracatheter Q12H  . [MAR Hold] thiamine  100 mg Oral Daily   Continuous Infusions: . lactated ringers 10 mL/hr at 11/12/16 1113     LOS: 14 days      ELGERGAWY, DAWOOD, MD [facility] Pager [phone]  If 7PM-7AM, please contact night-coverage [email or web address] Password [email or web address] 11/12/2016, 12:11 PM

## 2016-11-12 NOTE — Care Management Note (Addendum)
Case Management Note  Patient Details  Name: [name] MRN: [id number] Date of Birth: 1980/12/16  Subjective/Objective:  Pt presented for increasing back pain and AMS. MRI confirmed L4-5 septic right facet arthritis and abscess within the paraspinal musculature. Status post Decompressive posterior lumbar laminectomy level 4-5. Plan for I&D 11-12-16 with wound vac.              Action/Plan: [name] monitoring patient for admission. Hopefully stable for transfer 11-13-16. CM will continue to monitor for additional needs.   Expected Discharge Date:   (unknown)               Expected Discharge Plan:  [place]  In-House Referral:  Clinical Social Work  Discharge planning Services  CM Consult  Post Acute Care Choice:  NA Choice offered to:  NA  DME Arranged:  N/A DME Agency:  NA  HH Arranged:  NA HH Agency:  NA  Status of Service:  Completed, signed off  If discussed at [place] of Stay Meetings, dates discussed:  11-12-16  Additional Comments: 11-16-16 [place], RN,BSN [phone] Pt will d/c to [name] 11-16-16. No further needs from CM at this time.    [place], RN, BSN [phone] Plan for OR Monday with VAC change- with possible wound closure for Wed. CM will continue to monitor for disposition needs. [name] to continue to follow as well.  Bethena Roys, RN 11/12/2016, 2:55 PM

## 2016-11-12 NOTE — Brief Op Note (Signed)
10/29/2016 - 11/12/2016  12:59 PM  PATIENT:  [name]  36 y.o. male  PRE-OPERATIVE DIAGNOSIS:  Facet Infection  POST-OPERATIVE DIAGNOSIS:  Facet Infection  PROCEDURE:  Procedure(s): LUMBAR WOUND DEBRIDEMENT (N/A)  SURGEON:  Surgeon(s) and Role:    * Melina Schools, MD - Primary  PHYSICIAN ASSISTANT:   ASSISTANTS: none   ANESTHESIA:   general  EBL:  Total I/O In: -  Out: 1150 [id number]  BLOOD ADMINISTERED:none  DRAINS: none   LOCAL MEDICATIONS USED:  NONE  SPECIMEN:  No Specimen  DISPOSITION OF SPECIMEN:  N/A  COUNTS:  YES  TOURNIQUET:  * No tourniquets in log *  DICTATION: .Other Dictation: Dictation Number [id number]  PLAN OF CARE: Admit to inpatient   PATIENT DISPOSITION:  PACU - hemodynamically stable.

## 2016-11-12 NOTE — Transfer of Care (Signed)
Immediate Anesthesia Transfer of Care Note  Patient: [name]  Procedure(s) Performed: Procedure(s): LUMBAR WOUND DEBRIDEMENT (N/A)  Patient Location: PACU  Anesthesia Type:General  Level of Consciousness: awake, alert  and patient cooperative  Airway & Oxygen Therapy: Patient Spontanous Breathing and Patient connected to nasal cannula oxygen  Post-op Assessment: Report given to RN, Post -op Vital signs reviewed and stable, Patient moving all extremities X 4 and Patient able to stick tongue midline  Post vital signs: Reviewed and stable  Last Vitals:  Vitals:   11/12/16 0900 11/12/16 1314  BP: 121/64   Pulse: 77   Resp:    Temp: 36.7 C 36.6 C    Last Pain:  Vitals:   11/12/16 1314  TempSrc:   PainSc: 0-No pain      Patients Stated Pain Goal: 0 ([id number])  Complications: No apparent anesthesia complications

## 2016-11-12 NOTE — Anesthesia Procedure Notes (Signed)
Procedure Name: Intubation Date/Time: 11/12/2016 12:07 PM Performed by: Nolon Nations Pre-anesthesia Checklist: Patient identified, Emergency Drugs available, Suction available and Patient being monitored Patient Re-evaluated:Patient Re-evaluated prior to inductionOxygen Delivery Method: Circle system utilized Preoxygenation: Pre-oxygenation with 100% oxygen Intubation Type: IV induction Ventilation: Mask ventilation without difficulty Laryngoscope Size: Mac and 3 Grade View: Grade I Tube type: Oral Tube size: 7.0 mm Number of attempts: 1 Airway Equipment and Method: Stylet Placement Confirmation: ETT inserted through vocal cords under direct vision,  positive ETCO2 and breath sounds checked- equal and bilateral Secured at: 23 cm Tube secured with: Tape Dental Injury: Teeth and Oropharynx as per pre-operative assessment  Difficulty Due To: Difficulty was anticipated

## 2016-11-12 NOTE — Progress Notes (Signed)
Daily Progress Note   Patient Name: [name]       Date: 11/12/2016 DOB: 1980/11/24  Age: 36 y.o. MRN#: [id number] Attending Physician: Albertine Patricia, MD Primary Care Physician: Nicholes Rough, PA-C Admit Date: 10/29/2016  Reason for Consultation/Follow-up: Pain control, Psychosocial/spiritual support and follow-up on goals of care conversation.  Subjective: [name] continues to improve and is doing well today. He is fully alert and oriented with fluent speech and resolution of the hand tremor I noted yesterday. He feels generally well; his pain is well controlled and he remains optimistic about getting better. His only acute complaint is a persistent dull tightness in the back of his right knee. It is fine at rest, however the tightness occurs with bending.  Today, orthopedics plan to repeat I&D of the back with wound vac placement.   Length of Stay: 14  Current Medications: Scheduled Meds:  . cefTRIAXone (ROCEPHIN)  IV  2 g Intravenous Q24H  . chlorhexidine  15 mL Mouth Rinse BID  . Chlorhexidine Gluconate Cloth  6 each Topical Daily  . folic acid  1 mg Oral Daily  . lactulose  30 g Oral BID  . mouth rinse  15 mL Mouth Rinse q12n4p  . multivitamin with minerals  1 tablet Oral Daily  . pantoprazole sodium  40 mg Oral Daily  . protein supplement shake  11 oz Oral BID BM  . rifaximin  400 mg Oral Q8H  . risperiDONE  1 mg Oral Q12H  . sodium chloride flush  10-40 mL Intracatheter Q12H  . sodium chloride flush  10-40 mL Intracatheter Q12H  . thiamine  100 mg Oral Daily    Continuous Infusions:  PRN Meds: haloperidol lactate, labetalol, ondansetron **OR** ondansetron (ZOFRAN) IV, sodium chloride flush, sodium chloride flush, traMADol  Physical Exam   Constitutional: He is oriented to person, place, and time. He appears  well-developed and well-nourished. No distress.  HENT:  Head: Normocephalic and atraumatic.  Mouth/Throat: No oropharyngeal exudate.  Eyes: EOM are normal.  Neck: Normal range of motion.  Cardiovascular: Normal rate.   Pulmonary/Chest: Effort normal.  Abdominal: Soft.  Musculoskeletal: He exhibits no edema.  Right thigh with dressing in place-CDI. No edema or tenderness to palpation around knee. Full ROM with knee. Neurological: He is alert and oriented to person, place, and time.  Skin: Skin is warm and dry. There is pallor.  Psychiatric: He has a normal mood and affect. His behavior is normal. Judgment and thought content normal.         Vital Signs: BP 133/65   Pulse 77   Temp 98.5 F (36.9 C) (Oral)   Resp 20   Ht 5' 11" (1.803 m)   Wt 110 kg (242 lb 8.1 oz)   SpO2 95%   BMI 33.82 kg/m  SpO2: SpO2: 95 % O2 Device: O2 Device: Not Delivered O2 Flow Rate: O2 Flow Rate (L/min): 1.5 L/min  Intake/output summary:  Intake/Output Summary (Last 24 hours) at 11/12/16 0850 Last data filed at 11/12/16 0544  Gross per 24 hour  Intake               50 ml  Output  1375 ml  Net            -1325 ml   LBM: Last BM Date: 11/09/16; last BM 2/14 per pt.  Baseline Weight: Weight: 120 kg (264 lb 8 oz) Most recent weight: Weight: 110 kg (242 lb 8.1 oz)  Palliative Assessment/Data: PPS 60%   Flowsheet Rows   Flowsheet Row Most Recent Value  Intake Tab  Referral Department  Hospitalist  Unit at Time of Referral  Cardiac/Telemetry Unit  Palliative Care Primary Diagnosis  Other (Comment) [cirrhosis]  Date Notified  11/08/16  Palliative Care Type  New Palliative care  Reason for referral  Clarify Goals of Care  Date of Admission  10/29/16  # of days IP prior to Palliative referral  10  Clinical Assessment  Psychosocial & Spiritual Assessment  Palliative Care Outcomes      Patient Active Problem List   Diagnosis Date Noted  . Alcoholic cirrhosis of liver without  ascites ([place])   . Goals of care, counseling/discussion   . Palliative care by specialist   . Pyogenic arthritis of multiple sites ([place])   . Traumatic compartment syndrome of other sites [facility])   . Alcoholic liver failure ([place])   . Traumatic compartment syndrome of lower extremity ([place])   . Infection of lumbar spine ([place]) 11/01/2016  . Delirium tremens ([place])   . Abnormal transaminases   . Alcoholic hepatitis without ascites   . ETOH abuse   . Abscess of right lower extremity   . Cellulitis and abscess of right lower extremity   . Hypernatremia   . Cellulitis of right lower extremity 10/30/2016  . EtOH dependence ([place]) 10/30/2016  . Thrombocytopenia ([place]) 10/30/2016  . Cirrhosis ([place])   . Paraspinal abscess ([place])   . Chronic hepatitis C without hepatic coma ([place])   . Hepatitis   . Epidural abscess 10/29/2016  . Liver failure ([place]) 10/29/2016  . Sepsis ([place]) 10/29/2016  . Hyponatremia 10/29/2016    Palliative Care Assessment & Plan   HPI: 36 y.o. male  with past medical history of chronic hepatitis C, alcoholic hepatitis, fatty liver, ongoing ETOH intake, and prior IVDA who was admitted on 10/29/2016 with L4-5 right facet joint septic arthritis, and soft tissue abscess within the adjacent posterior paraspinous musculature. On admission he was also undergoing DTs, and required intubation in the ICU. He is now s/p L4-5 decompressive posterior lumbar laminectomy and  I&D of thigh abscess (done 2/4). ID followed and recommended 6 weeks of antibiotics, for which he will need to be in a completely supervised setting if getting IV antibiotics given hx of IVDA. GI following for acute hepatic failure r/t alcoholic hepatitis, which they feel will take 1-3 months to resolve and for which he remains at high risk for hepatic decompensation during this time. He is presently extubated with improved mentation. Palliative was consulted to assist in goals of care.  Assessment: I met with [name], his  mother, brother, friend, and girlfriend on 2/14. Please see consult note for full details of our conversation. In brief, [name] is focused on getting better and remaining on a path towards wellness. He has a good grasp of the medical issues he is facing, and plans to pursue any and all interventions that will prolong his life. He also acknowledges the seriousness of his drinking, the impact it has on his body, and verbalized a plan to be and remain sober. His family and friends provide good support and were focused on helping him get and stay  healthy.  Today, Enio continues to do well. He is fully alert and oriented. He recalled our conversation from yesterday, and I was able to answer a few follow-up questions he had surrounding rationale for rehab and duration of antibiotics. He again reiterated his plan for staying sober, which he had been thinking about since our conversation yesterday.   Recommendations/Plan:  Full code, full scope interventions  [name] vs [name]/rehab based on insurance and bed availability  Would have Palliative follow if at [name]  Right knee tenderness: will try heating pack  Goals of Care and Additional Recommendations:  Limitations on Scope of Treatment: Full Scope Treatment  Code Status:  Full code  Prognosis:   Unable to determine  Discharge Planning:  To Be Determined  Care plan was discussed with pt.   Thank you for allowing the Palliative Medicine Team to assist in the care of this patient.  Total time: 25 minutes    Greater than 50%  of this time was spent counseling and coordinating care related to the above assessment and plan.  Charlynn Court, NP Palliative Medicine Team [phone] pager (7a-5p) Team Phone # [phone]

## 2016-11-12 NOTE — Progress Notes (Signed)
    Subjective: Procedure(s) (LRB): LUMBAR WOUND DEBRIDEMENT (N/A) Day of Surgery  Patient reports pain as 3 on 0-10 scale.  Reports unchanged leg pain reports incisional back pain   Positive void Positive bowel movement Positive flatus Negative chest pain or shortness of breath  Objective: Vital signs in last 24 hours: Temp:  [98.1 F (36.7 C)-98.9 F (37.2 C)] 98.1 F (36.7 C) (02/15 0900) Pulse Rate:  [77-123] 77 (02/15 0900) Resp:  [16-20] 20 (02/15 0539) BP: (110-133)/(52-66) 121/64 (02/15 0900) SpO2:  [95 %-98 %] 98 % (02/15 0900) Weight:  [110 kg (242 lb 8.1 oz)] 110 kg (242 lb 8.1 oz) (02/15 0539)  Intake/Output from previous day: 02/14 0701 - 02/15 0700 In: 50 [IV Piggyback:50] Out: 1375 [Urine:1375]  Labs:  Recent Labs  11/11/16 0537 11/12/16 0800  WBC 10.0 7.1  RBC 2.75* 2.72*  HCT 29.2* 28.8*  PLT 108* 85*    Recent Labs  11/11/16 0537 11/12/16 0800  NA 136 137  K 3.5 3.7  CL 106 108  CO2 23 23  BUN 5* 5*  CREATININE 0.69 0.59*  GLUCOSE 86 87  CALCIUM 8.0* 8.1*    Recent Labs  11/11/16 0537 11/12/16 0500  INR 1.91 2.00    Physical Exam: Neurologically intact ABD soft Intact pulses distally Incision: moderate drainage Compartment soft  Assessment/Plan: Patient stable  xrays n/a  Continue mobilization with physical therapy Continue care  Patient stable Plan on I&D and wound vac for back incision.  Increasing drainage - concern for potential abscess.    Risks/benefits reviewed with patient and family.  Melina Schools, MD [place] [phone]

## 2016-11-12 NOTE — Progress Notes (Signed)
Rehab admissions - I have approval for acute inpatient rehab admission.  Noted patient to OR today for I & D and VAC application.  I spoke with patient's mom today.  All are in agreement to inpatient rehab admission.  I spoke with Dr. Waldron Labs and he wants to plan inpatient rehab admit for tomorrow given that patient was in OR today.  I will plan for [name] admit in am.  I will have my partner follow up tomorrow in my absence.  Call for questions.  [id number]

## 2016-11-12 NOTE — Progress Notes (Signed)
PT Cancellation Note  Patient Details Name: [name] MRN: [id number] DOB: 18-May-1981   Cancelled Treatment:    Reason Eval/Treat Not Completed: Patient at procedure or test/unavailable. Pt going for I and D with vac placement. Will follow up at later date. Acute PT to continue.   Willow Ora 11/12/2016, 10:44 AM  Willow Ora, PTA, CLT Acute Rehab Services [phone] 11/12/16, 10:44 AM

## 2016-11-12 NOTE — Anesthesia Preprocedure Evaluation (Signed)
Anesthesia Evaluation  Patient identified by MRN, date of birth, ID band Patient unresponsive    Reviewed: Allergy & Precautions, NPO status , Patient's Chart, lab work & pertinent test results  History of Anesthesia Complications Negative for: history of anesthetic complications  Airway Mallampati: II  TM Distance: >3 FB Neck ROM: Full    Dental no notable dental hx.    Pulmonary Current Smoker,    Pulmonary exam normal breath sounds clear to auscultation       Cardiovascular negative cardio ROS Normal cardiovascular exam Rhythm:Regular Rate:Normal     Neuro/Psych negative neurological ROS  negative psych ROS   GI/Hepatic negative GI ROS, (+) Cirrhosis     substance abuse  alcohol use, Hepatitis -, C  Endo/Other  negative endocrine ROS  Renal/GU negative Renal ROS     Musculoskeletal negative musculoskeletal ROS (+)   Abdominal   Peds  Hematology negative hematology ROS (+)   Anesthesia Other Findings   Reproductive/Obstetrics negative OB ROS                             Anesthesia Physical  Anesthesia Plan  ASA: III  Anesthesia Plan: General   Post-op Pain Management:    Induction: Intravenous  Airway Management Planned: Oral ETT  Additional Equipment:   Intra-op Plan:   Post-operative Plan: Extubation in OR  Informed Consent: I have reviewed the patients History and Physical, chart, labs and discussed the procedure including the risks, benefits and alternatives for the proposed anesthesia with the patient or authorized representative who has indicated his/her understanding and acceptance.   Dental advisory given  Plan Discussed with: CRNA  Anesthesia Plan Comments:         Anesthesia Quick Evaluation

## 2016-11-12 NOTE — PMR Pre-admission (Signed)
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 Date: 09/28/16     Deduct:  $400 (met $400)      Out of Pocket Max: $800 (met $800)      Life Max: unlimited [name]: 70%      [name]: 70%/30% with 60 days max Outpatient: 30 combined visits     Co-Pay: $20/visit Home Health: 70%      Co-Pay: 30% DME: 70%     Co-Pay: 30% Providers: in network  Medicaid Application Date:        Case Manager:   Disability Application Date:        Case Worker:    Emergency Contact Information Contact Information    Name Relation Home Work Mobile   [place] Mother [phone]     Lockie Mola Significant other [phone]     Talmage Coin [phone]       Current Medical History  Patient Admitting Diagnosis:  Paraparesis, right greater than left, secondary to epidural abscess  History of Present Illness: A 36 y.o.malewith history of Hep C, ETOH abuse with cirrhosis of liver, IV drug abuse--clean for 15 years, chronic back pain, RLE cellulitis, who had recent MRI showing evidence of L4-5 right facet septic arthritis with soft tissue abscess paraspinal musculature 1.3 cm X 1.7 cm C 4.6 cm and L2/3 and L3/4 disc protrusions. He was admitted via MD office on 02/1 and started on IV antibiotics. Dr. Tommy Medal expressed concerns regarding right thigh myositis and MRI RLE revealed extensive abscesses  involving posterior compartment of thigh along with myofasciiitis and cellulitis and abscess all along the course of sciatic/femoral nerve. Surgery delayed due to abnormal LFTs, INR 2.1 and thrombocytopenia. Dr. Fuller Plan consulted for input and recommended prednisone X 30 days for alcoholic hepatitis with jaundice. He developed mental status changes due to delirium tremens requiring intubation and sedation. He was taken to OR on 02/ 04/18 for I and D of right posterior thigh abscess by Dr. Veverly Fells and I and D of L4/5 epidural abscess with Gill decompression R L4/5 by Dr Rolena Infante. Patient self extubated 02/09 and required resumption of precedex due to psychosis and agitation.  Therapy evaluations done  revealing anxiety, confusion, hallucinations and generalized weakness affecting mobility and self care tasks. [name] recommended for follow up therapy.  Underwent I & D of back abscess with application for VAC therapy on 11/12/16 by Dr. Rolena Infante.  VAC changes are M-W-F with potential to return to OR on 11/25/16 for closure of back incision if healing well.   Past Medical History  Past Medical History:  Diagnosis Date  . Hepatitis C     Family History  family  history is not on file.  Prior Rehab/Hospitalizations: Patient broke his big toe in 01/18.  No previous rehab admissions.  Has the patient had major surgery during 100 days prior to admission? No  Current Medications   Current Facility-Administered Medications:  .  0.9 %  sodium chloride infusion, 250 mL, Intravenous, Continuous, Melina Schools, MD .  cefTRIAXone (ROCEPHIN) 2 g in dextrose 5 % 50 mL IVPB, 2 g, Intravenous, Q24H, Albertine Patricia, MD, 2 g at 11/15/16 1748 .  folic acid (FOLVITE) tablet 1 mg, 1 mg, Oral, Daily, Albertine Patricia, MD, 1 mg at 11/16/16 1020 .  labetalol (NORMODYNE,TRANDATE) injection 10 mg, 10 mg, Intravenous, Q4H PRN, Albertine Patricia, MD .  lactated ringers infusion, , Intravenous, Continuous, Melina Schools, MD,  Stopped at 11/12/16 1733 .  lactulose (CHRONULAC) 10 GM/15ML solution 30 g, 30 g, Oral, BID, Albertine Patricia, MD, 30 g at 11/16/16 1019 .  menthol-cetylpyridinium (CEPACOL) lozenge 3 mg, 1 lozenge, Oral, PRN **OR** phenol (CHLORASEPTIC) mouth spray 1 spray, 1 spray, Mouth/Throat, PRN, Melina Schools, MD .  methocarbamol (ROBAXIN) tablet 500 mg, 500 mg, Oral, Q6H PRN **OR** methocarbamol (ROBAXIN) 500 mg in dextrose 5 % 50 mL IVPB, 500 mg, Intravenous, Q6H PRN, Melina Schools, MD .  morphine 2 MG/ML injection 1-4 mg, 1-4 mg, Intravenous, Q3H PRN, Melina Schools, MD, 2 mg at 11/16/16 1210 .  multivitamin with minerals tablet 1 tablet, 1 tablet, Oral, Daily, Albertine Patricia, MD, 1 tablet at 11/16/16 1020 .  ondansetron (ZOFRAN) injection 4 mg, 4 mg, Intravenous, Q4H PRN, Melina Schools, MD .  ondansetron (ZOFRAN) tablet 4 mg, 4 mg, Oral, Q6H PRN **OR** ondansetron (ZOFRAN) injection 4 mg, 4 mg, Intravenous, Q6H PRN, Albertine Patricia, MD .  oxyCODONE (Oxy IR/ROXICODONE) immediate release tablet 10 mg, 10 mg, Oral, Q4H PRN, Melina Schools, MD, 10 mg at 11/16/16 1431 .  pantoprazole sodium (PROTONIX) 40 mg/20 mL oral suspension 40 mg, 40 mg, Oral, Daily, Albertine Patricia, MD, 40 mg at 11/16/16 1019 .  protein supplement (PREMIER PROTEIN) liquid, 11 oz, Oral, BID BM, Albertine Patricia, MD, 11 oz at 11/15/16 0958 .  risperiDONE (RISPERDAL) tablet 0.5 mg, 0.5 mg, Oral, Q12H, Silver Huguenin Elgergawy, MD, 0.5 mg at 11/16/16 1020 .  sodium chloride flush (NS) 0.9 % injection 3 mL, 3 mL, Intravenous, Q12H, Melina Schools, MD, 3 mL at 11/14/16 2200 .  sodium chloride flush (NS) 0.9 % injection 3 mL, 3 mL, Intravenous, PRN, Melina Schools, MD .  thiamine (VITAMIN B-1) tablet 100 mg, 100 mg, Oral, Daily, Albertine Patricia, MD, 100 mg at 11/16/16 1020 .  traMADol (ULTRAM) tablet 25 mg, 25 mg, Oral, Q6H PRN, Albertine Patricia, MD, 25 mg at 11/14/16 0603  Patients Current Diet: Diet regular Room service appropriate?  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 None  Prior Functional Level Prior Function Level of Independence: Independent  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[employer]: Did the patient need help bathing, dressing, using the toilet or eating?  Independent  Indoor Mobility: Did the patient need assistance with walking from room to room (with or without device)? Independent  Stairs: Did the patient need assistance with internal or external stairs (with or without device)? Independent  Functional Cognition: Did the patient need help planning regular tasks such as shopping or remembering to take medications? Independent  Current Functional Level Cognition  Overall Cognitive Status: Impaired/Different from baseline Current Attention Level: Selective Orientation Level: Oriented X4 Following Commands: Follows one step commands with increased time, Follows one step commands inconsistently Safety/Judgement: Decreased awareness of deficits, Decreased awareness of safety General Comments: Pt able to recall precautions, but continues to move quickly and require verbal cues to prevent forward bending and twisting.    Extremity Assessment (includes Sensation/Coordination)  Upper  Extremity Assessment: Generalized weakness (very shaky during mobility)  Lower Extremity Assessment: Defer to PT evaluation    ADLs  Overall ADL's : Needs assistance/impaired Eating/Feeding: Set up, Sitting Eating/Feeding Details (indicate cue type and reason): Pt set up with lunch at end of session Grooming: Minimal assistance, Sitting, Wash/dry face Grooming Details (indicate cue type and reason): Min assist for sitting balance Upper Body Bathing: Moderate assistance, Sitting Lower Body Bathing: Maximal assistance, Sit to/from stand (simulated) Upper Body Dressing : Maximal assistance, Sitting Upper Body Dressing Details (indicate cue type and reason): to don TLSO Lower Body Dressing: Minimal assistance Lower Body Dressing Details (indicate cue type and reason): Pt able to adjust L sock in sitting with proper technique. Cues for no bending Toilet Transfer: Min guard, Ambulation, RW Toilet Transfer Details (indicate cue type and reason): Simulated by sit to stand from chair with functional mobility Functional mobility during ADLs: Min guard, Rolling walker General ADL Comments: HR in 120s throughout activity. Cues to maintain back precautions throughout all activities.    Mobility  Overal bed mobility: Needs Assistance Bed Mobility: Rolling, Sidelying to Sit Rolling: Supervision Sidelying to sit: Min guard, HOB elevated Sit to sidelying: Max assist, +2 for physical assistance General bed mobility comments: Cues for log rolling and min guard for precautions with sitting.     Transfers  Overall transfer level: Needs assistance Equipment used: None Transfers: Sit to/from Stand Sit to Stand: Min guard Stand pivot transfers: +2 physical assistance, +2 safety/equipment, Max assist General transfer comment: Min guard with verbal cues for precautions. Pt able to stand with supervision and no AD.     Ambulation / Gait / Stairs / Wheelchair Mobility  Ambulation/Gait Ambulation/Gait  assistance: Physicist, medical (Feet): 275 Feet Assistive device: None Gait Pattern/deviations: Step-through pattern General Gait Details: Verbal cues initially to slow gait speed. Able to maintain precautions during amb without distraction. Gait velocity: decreased Gait velocity interpretation: Below normal speed for age/gender    Posture / Balance Dynamic Sitting Balance Sitting balance - Comments: ModA to don TLSO in sitting.  Balance Overall balance assessment: Needs assistance Sitting-balance support: Feet supported, No upper extremity supported Sitting balance-Leahy Scale: Good Sitting balance - Comments: ModA to don TLSO in sitting.  Standing balance support: During functional activity, No upper extremity supported Standing balance-Leahy Scale: Fair Standing balance comment: Able to stand with 1 UE support to use urinal but LOB x2 during dynamic standing and needed Min A. Stood from 4 mins when RN addressing dressing.    Special needs/care consideration BiPAP/CPAP No, but family believes patient has  sleep apnea and needs to be tested CPM No Continuous Drip IV No, but has a PICC line and will need IV antibiotics for 6 weeks. Dialysis No        Life Vest No Oxygen No Special Bed No Trach Size No Wound Vac (area) Yes      Location Back incision Skin:  Has a back incision with VAC in place, right knee incision.  Mom reports a rash on patient's neck in the past 2 weeks                             Bowel mgmt: Last BM 11/14/16  Bladder mgmt: Voiding WDL in urinal and up to bathroom with help. Diabetic mgmt No   Previous Home Environment Living Arrangements:  (girlfriend) Home Care Services: No Additional Comments: pt poor historian due to confusion  Discharge Living Setting Plans for Discharge Living Setting: Lives with (comment), Apartment (Lives with girlfriend.)  Mom says that she has a 1 level townhouse with 3 very shallow steps if patient were to need to go home  with her. Type of Home at Discharge: Apartment (2nd level apartment.) Discharge Home Layout: One level (2nd level apartment.) Discharge Home Access: Stairs to enter Entrance Stairs-Number of Steps: 15 steps to 2nd level apartment Does the patient have any problems obtaining your medications?: No  Social/Family/Support Systems Patient Roles: Other (Comment) (Has a girlfriend, mom and brother.) Contact Information: Twain [place] - mother - [phone] Anticipated Caregiver: mom, GF and brother Anticipated Caregiver's Contact Information: Elana Alm - girlfriend - [phone] Ability/Limitations of Caregiver: Mom is retired and can assist.  Girlfriend works. Caregiver Availability: Other (Comment) (Family aware of the need for 24/7 supervision.) Discharge Plan Discussed with Primary Caregiver: Yes Is Caregiver In Agreement with Plan?: Yes Does Caregiver/Family have Issues with Lodging/Transportation while Pt is in Rehab?: No  Goals/Additional Needs Patient/Family Goal for Rehab: Pt mod I, OT supervision and SLP supervision goals Expected length of stay: 10 days Cultural Considerations: [employer]ist Dietary Needs: Regular diet, thin liquids Equipment Needs: TBD Pt/Family Agrees to Admission and willing to participate: Yes Program Orientation Provided & Reviewed with Pt/Caregiver Including Roles  & Responsibilities: Yes  Decrease burden of Care through IP rehab admission: N/A  Possible need for [name] placement upon discharge: Not planned.  Patient has been cleared by ID to have IV antibiotics at home with family supervision.  He will not need [name] for IV antibiotic therapy.  Patient Condition: This patient's medical and functional status has changed since the consult dated: 11/05/16 in which the Rehabilitation Physician determined and documented that the patient's condition is appropriate for intensive rehabilitative care in an inpatient rehabilitation facility. See "History of  Present Illness" (above) for medical update. Functional changes are:  Currently requiring minguard to ambulate 275 feet RW. Patient's medical and functional status update has been discussed with the Rehabilitation physician and patient remains appropriate for inpatient rehabilitation. Will admit to inpatient rehab today.  Preadmission Screen Completed By:  [name], 11/16/2016 3:49 PM ______________________________________________________________________   Discussed status with Dr. Posey Pronto on 11/16/16 at 1549 and received telephone approval for admission today.  Admission Coordinator:  [name], [place] 11/16/17

## 2016-11-13 ENCOUNTER — Encounter (HOSPITAL_COMMUNITY): Payer: Self-pay | Admitting: Orthopedic Surgery

## 2016-11-13 NOTE — Progress Notes (Signed)
Noted pt for OR Monday for VAC change and Wed for possible closure of wound. I have discussed with Dr. Naaman Plummer. I have placed call to [employer] to clarify to admit today to [name] or to await OR procedure completions. I also discussed with Dr. Waldron Labs and will follow up today. [id number]

## 2016-11-13 NOTE — Progress Notes (Signed)
[employer] says no to admit pt to [name] for rehab and return to OR for VAC changes. I have alerted Dr. Waldron Labs, pt, RN CM and SW. We will follow up next week. [id number]

## 2016-11-13 NOTE — Plan of Care (Signed)
Problem: Safety: Goal: Ability to remain free from injury will improve Outcome: Completed/Met Date Met: 11/13/16 Call light and phone within pts reach, verbalized understanding of use

## 2016-11-13 NOTE — Anesthesia Postprocedure Evaluation (Signed)
Anesthesia Post Note  Patient: [name]  Procedure(s) Performed: Procedure(s) (LRB): LUMBAR WOUND DEBRIDEMENT (N/A)  Patient location during evaluation: PACU Anesthesia Type: General Level of consciousness: sedated and patient cooperative Pain management: pain level controlled Vital Signs Assessment: post-procedure vital signs reviewed and stable Respiratory status: spontaneous breathing Cardiovascular status: stable Anesthetic complications: no        Last Vitals:  Vitals:   11/13/16 0522 11/13/16 1300  BP: 122/71 131/76  Pulse: 64 72  Resp: 11 (!) 25  Temp: 37 C 36.7 C    Last Pain:  Vitals:   11/13/16 1509  TempSrc:   PainSc: 3    Pain Goal: Patients Stated Pain Goal: 0 (11/13/16 0900)               Nolon Nations

## 2016-11-13 NOTE — Op Note (Signed)
[name], [name]             ACCOUNT NO.:  [id number]  MEDICAL RECORD NO.:  [id number]  LOCATION:  3W20C                        FACILITY:  [facility]  PHYSICIAN:  [name], M.D. DATE OF BIRTH:  [birth year]  DATE OF PROCEDURE:  11/12/2016 DATE OF DISCHARGE:                              OPERATIVE REPORT   PREOPERATIVE DIAGNOSIS:  Posterior spinal infection (right facet infection with epidural abscess).  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  OPERATIVE PROCEDURE:  Repeat I and D with insertion of wound VAC.  COMPLICATIONS:  None.  CONDITION:  Stable.  HISTORY:  This is a very pleasant 36 year old gentleman who presented to my care with an obvious spine infection.  The patient was initially admitted to the Medical Service as he was in significant jaundice with underlying hepatitis.  Once he was stabilized, he ultimately underwent a posterior keel decompression with I and D.  He did well with this and ultimately was extubated and transferred out of the ICU.  He was complaining of left back pain, doing well.  Inspection of the wound revealed some serosanguineous drainage.  Given the history of infection, I was concerned that there potentially for an abscess formation, so I elected to take him back to the operating room today to reopen and explore the wound.  All appropriate risks, benefits and alternatives were discussed with the patient and consent was obtained.  OPERATIVE NOTE:  The patient was brought to the operating room, placed supine on the operating table.  After successful induction of general anesthesia and endotracheal intubation, TEDs, SCDs were applied.  He was turned prone onto the Wilson frame and all bony prominences were well padded.  The back was then prepped and draped in a standard fashion.  Time-out was then called and we reviewed all pertinent important data.  Once this was completed, the  previous lumbar incision was re-incised.  Upon re- incising and debriding down to the superficial fascia, there was a significant amount of dark-stained fluid, this was all irrigated out.  I pulse lavaged approximately 3 L of fluid in the superficial area.  I also removed all the Vicryl and Monocryl sutures.  Once I had washed out the superficial, I did re-incise the fascia.  The fascia itself was noted to still be intact.  The Vicryl sutures were secured.  There was some material down there, but it did not look as significant as the superficial area.  So, I did irrigate down deep with another 3 L.  I then irrigated with final 1 L.  I then placed a VAC into the wound and I purposely left the deep fascia open so that if there was any collection, it would be removed.  Once the [facility] was connected and secured to the patient, he was ultimately extubated, transferred to the PACU without incident.  At the end of the case, all needle and sponge counts were correct.  Plan will be for the patient to remain on the [facility], still mobile and ambulating and then next week Monday to return to the OR for repeat I and D and dressing change and VAC change and possible primary wound closure at that  time.     Jeromie Gainor D. Rolena Guerra, M.D.     DDB/MEDQ  D:  11/12/2016  T:  11/13/2016  Job:  [id number]

## 2016-11-13 NOTE — Progress Notes (Signed)
OT Cancellation Note  Patient Details Name: [name] MRN: [id number] DOB: [birth year]   Cancelled Treatment:    Reason Eval/Treat Not Completed: Pain limiting ability to participate. RN notified and will get pt some pain medication. Will follow up as time allows.  Binnie Kand M.S., OTR/L Pager: [phone]  11/13/2016, 9:08 AM

## 2016-11-13 NOTE — Progress Notes (Signed)
Nutrition Follow-up  DOCUMENTATION CODES:   Obesity unspecified  INTERVENTION:    Continue Ensure Enlive po BID, each supplement provides 350 kcal and 20 grams of protein  NUTRITION DIAGNOSIS:   Increased nutrient needs related to wound healing as evidenced by estimated needs.  Ongoing  GOAL:   Patient will meet greater than or equal to 90% of their needs  Met with PO intake and supplements  MONITOR:   PO intake, Supplement acceptance, Skin, Labs, I & O's  REASON FOR ASSESSMENT:   Consult Enteral/tube feeding initiation and management  ASSESSMENT:   36 yo male developed swelling in Rt leg and foot, and back pain.  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  He was seen by orthopedics, and started on IV abx. 2/04 surgery for decompressive posterior lumbar laminectomy L4-5 and I&D of right thigh abscess  He has hx of Hepatitis C and ETOH.  He was noted to have jaundice and was seen by GI.  He was started on prednisone for alcoholic hepatitis.  He was also seen by ID.  He developed progressive altered mental status due to delirium tremens.  He was transferred to ICU and intubated 2/3; self extubated 2/7.   Patient reports great intake. Likes Premier Protein shakes, but says he has not been receiving them. S/P lumbar wound debridement 2/15 with VAC placement. Plans to return to OR next Monday and Wednesday. Transferring to [name] soon, ? before or after repeat trips to OR. Labs and medications reviewed.  Diet Order:  Diet regular Room service appropriate? Yes; Fluid consistency: Thin  Skin:  Wound (see comment) (Incisions to leg & back (with VAC))  Last BM:  2/12  Height:   Ht Readings from Last 1 Encounters:  11/11/16 '5\' 11"'  (1.803 m)    Weight:   Wt Readings from Last 1 Encounters:  11/13/16 235 lb 4.8 oz (106.7 kg)    Ideal Body Weight:  78.2 kg  BMI:  Body mass index is 32.82  kg/m.  Estimated Nutritional Needs:   Kcal:  2000-2300kcal/day   Protein:  130-150 gm  Fluid:  >2.3 L  EDUCATION NEEDS:   Education needs no appropriate at this time  Molli Barrows, [place], [place], [place] Pager [phone] After Hours Pager [phone]

## 2016-11-13 NOTE — Progress Notes (Signed)
    Subjective: 1 Day Post-Op Procedure(s) (LRB): LUMBAR WOUND DEBRIDEMENT (N/A) Patient reports pain as 3 on 0-10 scale.   Denies CP or SOB.  Voiding without difficulty. Positive flatus. Objective: Vital signs in last 24 hours: Temp:  [97.8 F (36.6 C)-98.8 F (37.1 C)] 98.6 F (37 C) (02/16 0522) Pulse Rate:  [64-102] 64 (02/16 0522) Resp:  [9-21] 11 (02/16 0522) BP: (108-130)/(59-86) 122/71 (02/16 0522) SpO2:  [95 %-100 %] 95 % (02/16 0522) Weight:  [106.7 kg (235 lb 4.8 oz)] 106.7 kg (235 lb 4.8 oz) (02/16 0522)  Intake/Output from previous day: 02/15 0701 - 02/16 0700 In: 1955.3 [P.O.:240; I.V.:1615.3; IV Piggyback:100] Out: 2650 [Urine:2625; Blood:25] Intake/Output this shift: No intake/output data recorded.  Labs:  Recent Labs  11/11/16 0537 11/12/16 0800  HGB 9.2* 9.3*    Recent Labs  11/11/16 0537 11/12/16 0800  WBC 10.0 7.1  RBC 2.75* 2.72*  HCT 29.2* 28.8*  PLT 108* 85*    Recent Labs  11/11/16 0537 11/12/16 0800  NA 136 137  K 3.5 3.7  CL 106 108  CO2 23 23  BUN 5* 5*  CREATININE 0.69 0.59*  GLUCOSE 86 87  CALCIUM 8.0* 8.1*    Recent Labs  11/11/16 0537 11/12/16 0500  INR 1.91 2.00    Physical Exam: Neurologically intact ABD soft Intact pulses distally vac: not recorded  Assessment/Plan: 1 Day Post-Op Procedure(s) (LRB): LUMBAR WOUND DEBRIDEMENT (N/A) Advance diet  continue IV abx Plan on return to OR Monday for vac dressing change.  Melina Schools D for Dr. Melina Schools [facility] [phone] 11/13/2016, 9:49 AM

## 2016-11-13 NOTE — Progress Notes (Signed)
PROGRESS NOTE  [name]  [id number] DOB: [birth year]  DOA: 10/29/2016 PCP: Nicholes Rough, PA-C   Brief Narrative:  36 year old male with a PMH of chronic hepatitis C, alcoholic hepatitis, fatty liver, ongoing occasional alcohol intake, remote IVDA, seen by OP pain MD for increasing back pain, right lower extremity cellulitis and edema, ruled out DVT, presented back to provide her when stat MRI confirmed L4-5 septic right facet arthritis and abscess within the paraspinal musculature. Due to altered mental status from delirium tremens, he had to be transferred to ICU, intubated and was under CCM care. Orthopedics, [place] [facility], infectious disease assisted with management. He underwent back and right lower extremity surgery. Extubated and stabilized. Care transferred [facility] to [facility] on 11/06/16.   Assessment & Plan:   Principal Problem:   Epidural abscess Active Problems:   Liver failure (HCC)   Hyponatremia   Cellulitis of right lower extremity   EtOH dependence (HCC)   Thrombocytopenia (HCC)   Cirrhosis (HCC)   Paraspinal abscess (HCC)   Chronic hepatitis C without hepatic coma (HCC)   Hepatitis   Abnormal transaminases   Alcoholic hepatitis without ascites   ETOH abuse   Abscess of right lower extremity   Cellulitis and abscess of right lower extremity   Hypernatremia   Delirium tremens (HCC)   Infection of lumbar spine (HCC)   Pyogenic arthritis of multiple sites [facility])   Traumatic compartment syndrome of other sites [facility])   Alcoholic liver failure ([place])   Traumatic compartment syndrome of lower extremity ([place])   Alcoholic cirrhosis of liver without ascites ([place])   Goals of care, counseling/discussion   Palliative care by specialist   Infection   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 empirically  on IV vancomycin and Zosyn was changed to cefepime, ID recommending total 6 weeks of IV Rocephin, history of remote  IV drug abuse, more than 36 years ago, discussed with ID, patient and his mother, at this point plan for [name]/[name], and mother will provide care at home with 24/7 supervision from her multiple family members. - Status post Decompressive posterior lumbar laminectomy level IV-V by orthopedics on 11/01/16, again to OR on 12/15 for lumbar wound debridement, and wound VAC insertion, plan to go again on Monday for VAC dressing change - Post surgery orthopedics recommended mobilization with PT, upper therapy and TLSO brace. PT and OT evaluated and recommended [name].  Right posterior deep thigh abscess/pyomyositis:  - Status post I&D by orthopedics on 11/01/16.   Acute hepatic failure/Jaundice/hepatitis C/fatty liver:  - History of hepatitis C and fatty liver. History of severe alcoholic hepatitis [id number]. Never followed through with planned treatment with [facility] after failing some unspecified antiviral in 2016. MELD=24 & discriminant function 66 consistent with recurrent alcoholic hepatitis. GI was consulted. Patient was started on prednisone 40 mg daily 30 days which was switched to IV Solu-Medrol while he was intubated, for alcoholic hepatitis. Steroids were then stopped on 11/02/16 due to ongoing infection. As per GI, alcoholic hepatitis typically takes 1-3 months to resolve and he is at increased risk for hepatic decompensation, morbidity and mortality from this and this risk will not likely change for 1-2 months. - Avoid alcohol completely and acetaminophen.  - He was also placed on lactulose and rifaximin. LFTs improving. - He has to follow up with his primary GI doctor (Dr. Alice Reichert) in 3-4 weeks from discharge.  Alcohol abuse and likely dependence:  - On 10/31/16, patient developed progressive  altered mental status due to delirium tremens and he was transferred to ICU. He was started on Precedex drip and multivitamins.  Coagulopathy - Secondary to acute liver failure. No bleeding reported. Required vitamin K and  FFP before surgery  Thrombocytopenia - Secondary to liver disease. Treating  Hyponatremia - Resolved.  Acute hypoxic respiratory failure secondary to unable to protect airways due to DTs:  - Transfer to ICU. Intubated 11/01/16. CCM managed. Patient self extubated on 11/04/16. Maintaining airway.  Acute encephalopathy secondary to delirium tremens/hepatic encephalopathy:  - Remains on rifaximin and lactulose. [place] [facility] assisted with management. DTs improved but patient remained confused. His mental status changes were felt to be multifactorial secondary to medications in addition to hepatic encephalopathy and ongoing infections. Sedative medications were minimized. Ammonia has normalized. Remains on risperidone 1 MG twice a day. Sleep hygiene. Continue to monitor closely. Appears to be improving , currently awake and appropriate with good cognition and coherence  Hypoglycemia: - Resolved. Speech therapy evaluated and recommended a regular consistency diet.  Hypokalemia and hypomagnesemia - Replace and follow as needed.  Anemia - Stable.  Goals of care:  As per chart review, patient's brother indicated that patient's significant other is not his wife and patient's mother should be in charge of making healthcare decisions, CODE STATUS and short-term versus long-term goals care. PMT's onboard to further evaluate case and assist.   DVT prophylaxis: SCDs(unable to do any chemical prophylaxis given coagulopathy and thrombocytopenia) Code Status: Full Family Communication: Discussed with mother via phone 12/13 Disposition Plan: Pending clinical course and improvement,   Consultants:   Orthopedics  [facility]  Infectious disease  CCM  Rehabilitation M.D.  Procedures:   Intubated for mechanical ventilation 11/01/16  Central line 11/01/16-Discontinued   I&D of right posterior deep deep thigh abscess by orthopedics on 11/01/16  Decompressive posterior lumbar laminectomy level IV-V by  orthopedics on 11/01/16  NG tube feeding-discontinued.  Arterial line-discontinued  Foley catheter  Antimicrobials:   IV Rocephin  Rifaximin     Subjective: Seen this morning.no acute events overnight, reports pain in right lower thigh is controlled , no nausea, no vomiting, no chest pain or shortness of breath Objective:  Vitals:   11/12/16 1840 11/12/16 1943 11/13/16 0522 11/13/16 1300  BP: 108/66 118/71 122/71 131/76  Pulse: 88 75 64 72  Resp: 16 (!) 9 11 (!) 25  Temp:  98.8 F (37.1 C) 98.6 F (37 C) 98.1 F (36.7 C)  TempSrc:  Oral Oral Oral  SpO2: 96% 96% 95% 100%  Weight:   106.7 kg (235 lb 4.8 oz)   Height:        Intake/Output Summary (Last 24 hours) at 11/13/16 1517 Last data filed at 11/13/16 0900  Gross per 24 hour  Intake           555.33 ml  Output             1025 ml  Net          -469.67 ml   Filed Weights   11/11/16 0526 11/12/16 0539 11/13/16 0522  Weight: 108.9 kg (240 lb) 110 kg (242 lb 8.1 oz) 106.7 kg (235 lb 4.8 oz)    Examination:  General exam: Pleasant young male lying propped up in bed.  Respiratory system: Clear to auscultation. Respiratory effort normal. Cardiovascular system: S1 & S2 heard, RRR. No JVD, murmurs, rubs, gallops or clicks. No pedal edema.  Gastrointestinal system: Abdomen is nondistended, soft and nontender. No organomegaly or masses  felt. Normal bowel sounds heard. Foley catheter +. Central nervous system: Alert and oriented only to self. No focal neurological deficits. Extremities: Symmetric 5 x 5 power. Bilateral wrist restraints and mittens. Skin: No rashes, lesions or ulcers. Lumbar spine surgical site dressing With wound VAC. Right posterior thigh with no discharge on dressing today.Marland Kitchen Psychiatry: Awake, alert and appropriate    Data Reviewed: I have personally reviewed following labs and imaging studies  CBC:  Recent Labs Lab 11/07/16 0909 11/08/16 0849 11/09/16 1527 11/11/16 0537 11/12/16 0800  WBC  9.7 10.4 11.4* 10.0 7.1  NEUTROABS 5.6 6.8  --   --   --   HGB 9.6* 9.2* 9.0* 9.2* 9.3*  HCT 29.8* 28.5* 28.3* 29.2* 28.8*  MCV 103.8* 104.4* 105.6* 106.2* 105.9*  PLT 142* 146* 132* 108* 85*   Basic Metabolic Panel:  Recent Labs Lab 11/07/16 0909 11/08/16 0849 11/09/16 1527 11/11/16 0537 11/12/16 0800  NA 139 140 138 136 137  K 3.4* 3.2* 3.6 3.5 3.7  CL 107 106 109 106 108  CO2 24 27 24 23 23   GLUCOSE 94 89 104* 86 87  BUN 6 <5* <5* 5* 5*  CREATININE 0.56* 0.59* 0.68 0.69 0.59*  CALCIUM 8.5* 8.1* 8.0* 8.0* 8.1*  MG 1.5* 1.7 1.7  --   --    GFR: Estimated Creatinine Clearance: 160.2 mL/min (by C-G formula based on SCr of 0.59 mg/dL (L)). Liver Function Tests:  Recent Labs Lab 11/07/16 0754 11/07/16 0909 11/08/16 0849 11/09/16 1527 11/12/16 0800  AST 116* 122* 105* 115* 100*  ALT 52 56 48 51 50  ALKPHOS 99 101 92 92 84  BILITOT 8.5* 8.7* 7.5* 6.2* 5.5*  PROT 5.9* 5.7* 5.5* 6.1* 6.0*  ALBUMIN 1.8* 1.8* 1.6* 1.8* 1.8*   No results for input(s): LIPASE, AMYLASE in the last 168 hours.  Recent Labs Lab 11/07/16 0909 11/08/16 0849  AMMONIA 53* 37*   Coagulation Profile:  Recent Labs Lab 11/08/16 0417 11/09/16 1527 11/10/16 0439 11/11/16 0537 11/12/16 0500  INR 1.99 1.93 1.95 1.91 2.00   Cardiac Enzymes: No results for input(s): CKTOTAL, CKMB, CKMBINDEX, TROPONINI in the last 168 hours. BNP (last 3 results) No results for input(s): PROBNP in the last 8760 hours. HbA1C: No results for input(s): HGBA1C in the last 72 hours. CBG:  Recent Labs Lab 11/08/16 1955 11/09/16 0044 11/09/16 0911 11/09/16 1125 11/12/16 1316  GLUCAP 95 83 124* 105* 88   Lipid Profile: No results for input(s): CHOL, HDL, LDLCALC, TRIG, CHOLHDL, LDLDIRECT in the last 72 hours. Thyroid Function Tests: No results for input(s): TSH, T4TOTAL, FREET4, T3FREE, THYROIDAB in the last 72 hours. Anemia Panel: No results for input(s): VITAMINB12, FOLATE, FERRITIN, TIBC, IRON,  RETICCTPCT in the last 72 hours.  Sepsis Labs:  Recent Labs Lab 11/08/16 0849 11/09/16 1527 11/11/16 0537 11/12/16 0800  WBC 10.4 11.4* 10.0 7.1    No results found for this or any previous visit (from the past 240 hour(s)).       Radiology Studies: No results found.      Scheduled Meds: . cefTRIAXone (ROCEPHIN)  IV  2 g Intravenous Q24H  . folic acid  1 mg Oral Daily  . lactulose  30 g Oral BID  . multivitamin with minerals  1 tablet Oral Daily  . pantoprazole sodium  40 mg Oral Daily  . protein supplement shake  11 oz Oral BID BM  . rifaximin  400 mg Oral Q8H  . risperiDONE  1 mg Oral Q12H  .  sodium chloride flush  3 mL Intravenous Q12H  . thiamine  100 mg Oral Daily   Continuous Infusions: . sodium chloride    . lactated ringers Stopped (11/12/16 1733)     LOS: 15 days      Shenice Dolder, MD [facility] Pager [phone]  If 7PM-7AM, please contact night-coverage [email or web address] Password [facility] 11/13/2016, 3:17 PM

## 2016-11-13 NOTE — Progress Notes (Signed)
Physical Therapy Treatment Patient Details Name: Edgardo Turlington MRN: [id number] DOB: 09-23-1981 Today's Date: 11/13/2016    History of Present Illness 36 yo male developed swelling in Rt leg and foot, and back pain.  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  He was seen by orthopedics, and started on IV abx. 2/04 surgery for decompressive posterior lumbar laminectomy L4-5 and I&D of right thigh abscess  He has hx of Hepatitis C and ETOH.  He was noted to have jaundice and was seen by GI.  He was started on prednisone for alcoholic hepatitis.  He was also seen by ID.  He developed progressive altered mental status due to delirium tremens.  He was transferred to ICU and intubated 2/3; self extubated 2/7.   Pt s/p facet I & D with insertion of wound vac (2/15)    PT Comments    Pt s/p back I & D and insertion of wound vac on 2/15 with plan to change wound vac on 2/19.  PT goals still appropriate.  Pt is able to recall precautions, but needs cueing to incorporate them into mobility. Con't to feel pt is a good [name] candidate.  Follow Up Recommendations  [name]     Equipment Recommendations  None recommended by PT    Recommendations for Other Services Rehab consult     Precautions / Restrictions Precautions Precautions: Back;Other (comment) (wound vac) Precaution Booklet Issued: No Precaution Comments: reviewed back precautions Required Braces or Orthoses: Spinal Brace Spinal Brace: Applied in sitting position;Thoracolumbosacral orthotic Restrictions Weight Bearing Restrictions: No    Mobility  Bed Mobility Overal bed mobility: Needs Assistance Bed Mobility: Rolling;Sidelying to Sit Rolling: Supervision Sidelying to sit: Min assist       General bed mobility comments: cues for log rolling. MIN A to get trunk upright. Donned brace in sitting.  Transfers Overall transfer level: Needs assistance Equipment used:  Rolling walker (2 wheeled) Transfers: Sit to/from Stand Sit to Stand: Min guard            Ambulation/Gait Ambulation/Gait assistance: Min guard Ambulation Distance (Feet): 200 Feet Assistive device: Rolling walker (2 wheeled) Gait Pattern/deviations: Step-through pattern Gait velocity: decreased Gait velocity interpretation: Below normal speed for age/gender General Gait Details: HR up to 130 upon initial standing for gait, but then decreased.  In the 110s up to 120 with gait.  cueing for no twisting with gait.   Stairs            Wheelchair Mobility    Modified Rankin (Stroke Patients Only)       Balance                                    Cognition Arousal/Alertness: Awake/alert Behavior During Therapy: WFL for tasks assessed/performed Overall Cognitive Status: Within Functional Limits for tasks assessed       Memory: Decreased recall of precautions   Safety/Judgement: Decreased awareness of deficits;Decreased awareness of safety     General Comments: He is able to recall precautions, but moves quickly and has trouble incorporating without cuein    Exercises      General Comments        Pertinent Vitals/Pain Pain Assessment: 0-10 Pain Score: 4  Pain Location: back Pain Descriptors / Indicators: Sore Pain Intervention(s): Limited activity within patient's tolerance;Monitored during session;Premedicated before session;Repositioned    Home Living  Prior Function            PT Goals (current goals can now be found in the care plan section) Acute Rehab PT Goals 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

## 2016-11-14 NOTE — Progress Notes (Signed)
PROGRESS NOTE  [name]  [id number] DOB: [birth year]  DOA: 10/29/2016 PCP: Nicholes Rough, PA-C   Brief Narrative:  36 year old male with a PMH of chronic hepatitis C, alcoholic hepatitis, fatty liver, ongoing occasional alcohol intake, remote IVDA, seen by OP pain MD for increasing back pain, right lower extremity cellulitis and edema, ruled out DVT, presented back to provide her when stat MRI confirmed L4-5 septic right facet arthritis and abscess within the paraspinal musculature. Due to altered mental status from delirium tremens, he had to be transferred to ICU, intubated and was under CCM care. Orthopedics, [facility], infectious disease assisted with management. He underwent back and right lower extremity surgery. Extubated and stabilized. Care transferred over to [facility] on 11/06/16.   Assessment & Plan:   Principal Problem:   Epidural abscess Active Problems:   Liver failure (HCC)   Hyponatremia   Cellulitis of right lower extremity   EtOH dependence (HCC)   Thrombocytopenia (HCC)   Cirrhosis (HCC)   Paraspinal abscess (HCC)   Chronic hepatitis C without hepatic coma (HCC)   Hepatitis   Abnormal transaminases   Alcoholic hepatitis without ascites   ETOH abuse   Abscess of right lower extremity   Cellulitis and abscess of right lower extremity   Hypernatremia   Delirium tremens (HCC)   Infection of lumbar spine (HCC)   Pyogenic arthritis of multiple sites [facility])   Traumatic compartment syndrome of other sites [facility])   Alcoholic liver failure ([place])   Traumatic compartment syndrome of lower extremity ([place])   Alcoholic cirrhosis of liver without ascites ([place])   Goals of care, counseling/discussion   Palliative care by specialist   Infection   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 empirically  on IV vancomycin and Zosyn was changed to cefepime, ID recommending total 6 weeks of IV Rocephin, history of remote  IV drug abuse, more than 15 years ago, discussed with ID, patient and his mother, at this point plan for [name]/[name], and mother will provide care at home with 24/7 supervision from her multiple family members. - Status post Decompressive posterior lumbar laminectomy level IV-V by orthopedics on 11/01/16, again to OR on 12/15 for lumbar wound debridement, and wound VAC insertion, plan to go again on Monday for VAC dressing change - Post surgery orthopedics recommended mobilization with PT, upper therapy and TLSO brace. PT and OT evaluated and recommended [name].  Right posterior deep thigh abscess/pyomyositis:  - Status post I&D by orthopedics on 11/01/16. Followed by ortho.  Acute hepatic failure/Jaundice/hepatitis C/fatty liver:  - History of hepatitis C and fatty liver. History of severe alcoholic hepatitis [id number]. Never followed through with planned treatment with [facility] after failing some unspecified antiviral in 2016. MELD=24 & discriminant function 66 consistent with recurrent alcoholic hepatitis. GI was consulted. Patient was started on prednisone 40 mg daily 30 days which was switched to IV Solu-Medrol while he was intubated, for alcoholic hepatitis. Steroids were then stopped on 11/02/16 due to ongoing infection. As per GI, alcoholic hepatitis typically takes 1-3 months to resolve and he is at increased risk for hepatic decompensation, morbidity and mortality from this and this risk will not likely change for 1-2 months. - Avoid alcohol completely and acetaminophen.  - He was also placed on lactulose and rifaximin. LFTs improving. - He has to follow up with his primary GI doctor (Dr. Alice Reichert) in 3-4 weeks from discharge.  Alcohol abuse and likely dependence:  - On 10/31/16, patient  developed progressive altered mental status due to delirium tremens and he was transferred to ICU. He was started on Precedex drip and multivitamins.  Coagulopathy - Secondary to acute liver failure. No bleeding reported.  Required vitamin K and FFP before surgery  Thrombocytopenia - Secondary to liver disease. Treating  Hyponatremia - Resolved.  Acute hypoxic respiratory failure secondary to unable to protect airways due to DTs:  - Transfer to ICU. Intubated 11/01/16. CCM managed. Patient self extubated on 11/04/16. Maintaining airway.  Acute encephalopathy secondary to delirium tremens/hepatic encephalopathy:  - Remains on rifaximin and lactulose. [facility] assisted with management. DTs improved but patient remained confused. His mental status changes were felt to be multifactorial secondary to medications in addition to hepatic encephalopathy and ongoing infections. Sedative medications were minimized. Ammonia has normalized. Remains on risperidone 1 MG twice a day. Sleep hygiene. Continue to monitor closely. Appears to be improving , currently awake and appropriate with good cognition and coherence  Hypoglycemia: - Resolved. Speech therapy evaluated and recommended a regular consistency diet.  Hypokalemia and hypomagnesemia - Replace and follow as needed.  Anemia - Stable.  Goals of care:  As per chart review, patient's brother indicated that patient's significant other is not his wife and patient's mother should be in charge of making healthcare decisions, CODE STATUS and short-term versus long-term goals care. PMT's onboard to further evaluate case and assist.   DVT prophylaxis: SCDs(unable to do any chemical prophylaxis given coagulopathy and thrombocytopenia) Code Status: Full Family Communication: None at bedside Disposition Plan: Pending clinical course and improvement,   Consultants:   Orthopedics  [facility]  Infectious disease  CCM  Rehabilitation M.D.  Procedures:   Intubated for mechanical ventilation 11/01/16  Central line 11/01/16-Discontinued   I&D of right posterior deep deep thigh abscess by orthopedics on 11/01/16  Decompressive posterior lumbar laminectomy level IV-V by  orthopedics on 11/01/16  NG tube feeding-discontinued.  Arterial line-discontinued  Foley catheter  Antimicrobials:   IV Rocephin  Rifaximin     Subjective: Seen this morning.no acute events overnight, reports pain in right lower thigh is controlled , no nausea, no vomiting, no chest pain or shortness of breath Objective:  Vitals:   11/14/16 0400 11/14/16 0426 11/14/16 0623 11/14/16 0800  BP: 133/67   110/70  Pulse: (!) 49   62  Resp: (!) 21 11 12 14   Temp: 98.1 F (36.7 C)   98.2 F (36.8 C)  TempSrc: Oral   Oral  SpO2: 100%   98%  Weight:      Height:        Intake/Output Summary (Last 24 hours) at 11/14/16 1105 Last data filed at 11/14/16 0600  Gross per 24 hour  Intake             1560 ml  Output              840 ml  Net              720 ml   Filed Weights   11/11/16 0526 11/12/16 0539 11/13/16 0522  Weight: 108.9 kg (240 lb) 110 kg (242 lb 8.1 oz) 106.7 kg (235 lb 4.8 oz)    Examination:  General exam: Pleasant young male lying propped up in bed.  Respiratory system: Clear to auscultation. Respiratory effort normal. Cardiovascular system: S1 & S2 heard, RRR. No JVD, murmurs, rubs, gallops or clicks. No pedal edema.  Gastrointestinal system: Abdomen is nondistended, soft and nontender. No organomegaly or masses felt. Normal  bowel sounds heard. Foley catheter +. Central nervous system: Alert and oriented only to self. No focal neurological deficits. Extremities: Symmetric 5 x 5 power. Bilateral wrist restraints and mittens. Skin: No rashes, lesions or ulcers. Lumbar spine surgical site dressing With wound VAC. Right posterior thigh with no discharge on dressing today.Marland Kitchen Psychiatry: Awake, alert and appropriate    Data Reviewed: I have personally reviewed following labs and imaging studies  CBC:  Recent Labs Lab 11/08/16 0849 11/09/16 1527 11/11/16 0537 11/12/16 0800  WBC 10.4 11.4* 10.0 7.1  NEUTROABS 6.8  --   --   --   HGB 9.2* 9.0* 9.2* 9.3*    HCT 28.5* 28.3* 29.2* 28.8*  MCV 104.4* 105.6* 106.2* 105.9*  PLT 146* 132* 108* 85*   Basic Metabolic Panel:  Recent Labs Lab 11/08/16 0849 11/09/16 1527 11/11/16 0537 11/12/16 0800  NA 140 138 136 137  K 3.2* 3.6 3.5 3.7  CL 106 109 106 108  CO2 27 24 23 23   GLUCOSE 89 104* 86 87  BUN <5* <5* 5* 5*  CREATININE 0.59* 0.68 0.69 0.59*  CALCIUM 8.1* 8.0* 8.0* 8.1*  MG 1.7 1.7  --   --    GFR: Estimated Creatinine Clearance: 160.2 mL/min (by C-G formula based on SCr of 0.59 mg/dL (L)). Liver Function Tests:  Recent Labs Lab 11/08/16 0849 11/09/16 1527 11/12/16 0800  AST 105* 115* 100*  ALT 48 51 50  ALKPHOS 92 92 84  BILITOT 7.5* 6.2* 5.5*  PROT 5.5* 6.1* 6.0*  ALBUMIN 1.6* 1.8* 1.8*   No results for input(s): LIPASE, AMYLASE in the last 168 hours.  Recent Labs Lab 11/08/16 0849  AMMONIA 37*   Coagulation Profile:  Recent Labs Lab 11/08/16 0417 11/09/16 1527 11/10/16 0439 11/11/16 0537 11/12/16 0500  INR 1.99 1.93 1.95 1.91 2.00   Cardiac Enzymes: No results for input(s): CKTOTAL, CKMB, CKMBINDEX, TROPONINI in the last 168 hours. BNP (last 3 results) No results for input(s): PROBNP in the last 8760 hours. HbA1C: No results for input(s): HGBA1C in the last 72 hours. CBG:  Recent Labs Lab 11/08/16 1955 11/09/16 0044 11/09/16 0911 11/09/16 1125 11/12/16 1316  GLUCAP 95 83 124* 105* 88   Lipid Profile: No results for input(s): CHOL, HDL, LDLCALC, TRIG, CHOLHDL, LDLDIRECT in the last 72 hours. Thyroid Function Tests: No results for input(s): TSH, T4TOTAL, FREET4, T3FREE, THYROIDAB in the last 72 hours. Anemia Panel: No results for input(s): VITAMINB12, FOLATE, FERRITIN, TIBC, IRON, RETICCTPCT in the last 72 hours.  Sepsis Labs:  Recent Labs Lab 11/08/16 0849 11/09/16 1527 11/11/16 0537 11/12/16 0800  WBC 10.4 11.4* 10.0 7.1    No results found for this or any previous visit (from the past 240 hour(s)).       Radiology  Studies: No results found.      Scheduled Meds: . cefTRIAXone (ROCEPHIN)  IV  2 g Intravenous Q24H  . folic acid  1 mg Oral Daily  . lactulose  30 g Oral BID  . multivitamin with minerals  1 tablet Oral Daily  . pantoprazole sodium  40 mg Oral Daily  . protein supplement shake  11 oz Oral BID BM  . rifaximin  400 mg Oral Q8H  . risperiDONE  1 mg Oral Q12H  . sodium chloride flush  3 mL Intravenous Q12H  . thiamine  100 mg Oral Daily   Continuous Infusions: . sodium chloride    . lactated ringers Stopped (11/12/16 1733)     LOS: 16  days      Phillips Climes, MD [facility] Pager [phone]  If 7PM-7AM, please contact night-coverage [email or web address] Password [email or web address] 11/14/2016, 11:05 AM

## 2016-11-14 NOTE — Progress Notes (Signed)
Physical Therapy Treatment Patient Details Name: [name] MRN: [id number] DOB: 23-Oct-1980 Today's Date: 11/14/2016    History of Present Illness 36 yo male developed swelling in Rt leg and foot, and back pain.  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  He was seen by orthopedics, and started on IV abx. 2/04 surgery for decompressive posterior lumbar laminectomy L4-5 and I&D of right thigh abscess  He has hx of Hepatitis C and ETOH.  He was noted to have jaundice and was seen by GI.  He was started on prednisone for alcoholic hepatitis.  He was also seen by ID.  He developed progressive altered mental status due to delirium tremens.  He was transferred to ICU and intubated 2/3; self extubated 2/7.   Pt s/p facet I & D with insertion of wound vac (2/15)    PT Comments    Making notable functional progress. Still reliant on RW for support, mildly antalgic gait pattern and requires intermittent cues for safety and to maintain back precautions. VSS throughout session. Patient will continue to benefit from skilled physical therapy services to further improve independence with functional mobility.   Follow Up Recommendations  [name]     Equipment Recommendations  None recommended by PT    Recommendations for Other Services Rehab consult     Precautions / Restrictions Precautions Precautions: Back;Other (comment) (wound vac) Precaution Booklet Issued: No Precaution Comments: reviewed back precautions Required Braces or Orthoses: Spinal Brace Spinal Brace: Applied in sitting position;Thoracolumbosacral orthotic Restrictions Weight Bearing Restrictions: No    Mobility  Bed Mobility Overal bed mobility: Needs Assistance Bed Mobility: Rolling;Sidelying to Sit Rolling: Supervision Sidelying to sit: Min guard       General bed mobility comments: cues for log rolling. Close guard and VC for precautions with  transition to sitting. Donned brace in sitting.  Transfers Overall transfer level: Needs assistance Equipment used: Rolling walker (2 wheeled) Transfers: Sit to/from Stand Sit to Stand: Min guard         General transfer comment: Min guard for safety, minimal instability, with reliance on RW for support once upright. Decreased confidence but did not require physical assist.  Ambulation/Gait Ambulation/Gait assistance: Min guard Ambulation Distance (Feet): 275 Feet Assistive device: Rolling walker (2 wheeled) Gait Pattern/deviations: Step-through pattern Gait velocity: decreased Gait velocity interpretation: Below normal speed for age/gender General Gait Details: VSS. Intermittent cues to avoid twisting with turns. Decreased gait speed but no buckling noted. Mildly antalgic with decreased stance time on Rt.   Stairs            Wheelchair Mobility    Modified Rankin (Stroke Patients Only)       Balance                                    Cognition Arousal/Alertness: Awake/alert Behavior During Therapy: WFL for tasks assessed/performed Overall Cognitive Status: Within Functional Limits for tasks assessed       Memory: Decreased recall of precautions   Safety/Judgement: Decreased awareness of deficits;Decreased awareness of safety     General Comments: He is able to recall precautions, but moves quickly and has trouble incorporating without cues. Esp forward bending at times.    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 PT Goal  Formulation: With patient Time For Goal Achievement: 11/18/16 Potential to Achieve Goals: Good Progress towards PT goals: Progressing toward goals    Frequency    Min 4X/week      PT Plan Current plan remains appropriate    Co-evaluation             End of Session Equipment Utilized During Treatment: Gait belt;Back brace Activity Tolerance: Patient tolerated treatment well Patient left: in chair;with call bell/phone within reach (declined scds)     Time: 1131-1150 PT Time Calculation (min) (ACUTE ONLY): 19 min  Charges:  $Gait Training: 8-22 mins                    G CodesEllouise Newer 2016/11/15, 12:01 PM [phone]

## 2016-11-14 NOTE — Progress Notes (Signed)
   Subjective: 2 Days Post-Op Procedure(s) (LRB): LUMBAR WOUND DEBRIDEMENT (N/A) Patient reports pain as mild.   Patient seen in rounds with Dr. Wynelle Link. Patient is well, and has had no acute complaints or problems Has VAC drain in place on back  Objective: Vital signs in last 24 hours: Temp:  [98.1 F (36.7 C)-98.3 F (36.8 C)] 98.2 F (36.8 C) (02/17 0800) Pulse Rate:  [49-72] 62 (02/17 0800) Resp:  [11-25] 14 (02/17 0800) BP: (110-133)/(67-95) 110/70 (02/17 0800) SpO2:  [98 %-100 %] 98 % (02/17 0800)  Intake/Output from previous day: 02/16 0701 - 02/17 0700 In: 1680 [P.O.:1580; IV Piggyback:100] Out: 1040 [Urine:700; Drains:340] Intake/Output this shift: No intake/output data recorded.   Recent Labs  11/12/16 0800  HGB 9.3*    Recent Labs  11/12/16 0800  WBC 7.1  RBC 2.72*  HCT 28.8*  PLT 85*    Recent Labs  11/12/16 0800  NA 137  K 3.7  CL 108  CO2 23  BUN 5*  CREATININE 0.59*  GLUCOSE 87  CALCIUM 8.1*    Recent Labs  11/12/16 0500  INR 2.00    EXAM General - Patient is Alert and Appropriate Extremity - Neurovascular intact Sensation intact distally Dressing - dressing C/D/I, VAC in place and maintaining suction Motor Function - intact, moving foot and toes well on exam.   Past Medical History:  Diagnosis Date  . Hepatitis C     Assessment/Plan: 2 Days Post-Op Procedure(s) (LRB): LUMBAR WOUND DEBRIDEMENT (N/A) Principal Problem:   Epidural abscess Active Problems:   Liver failure (HCC)   Hyponatremia   Cellulitis of right lower extremity   EtOH dependence (HCC)   Thrombocytopenia (HCC)   Cirrhosis (HCC)   Paraspinal abscess (HCC)   Chronic hepatitis C without hepatic coma (HCC)   Hepatitis   Abnormal transaminases   Alcoholic hepatitis without ascites   ETOH abuse   Abscess of right lower extremity   Cellulitis and abscess of right lower extremity   Hypernatremia   Delirium tremens (HCC)   Infection of lumbar spine  (HCC)   Pyogenic arthritis of multiple sites [facility])   Traumatic compartment syndrome of other sites [facility])   Alcoholic liver failure (HCC)   Traumatic compartment syndrome of lower extremity (HCC)   Alcoholic cirrhosis of liver without ascites (HCC)   Goals of care, counseling/discussion   Palliative care by specialist   Infection  Estimated body mass index is 32.82 kg/m as calculated from the following:   Height as of this encounter: 5\' 11"  (1.803 m).   Weight as of this encounter: 106.7 kg (235 lb 4.8 oz).  Plan for [facility] change Monday  Arlee Muslim, PA-C Orthopaedic Surgery 11/14/2016, 9:28 AM

## 2016-11-15 LAB — BASIC METABOLIC PANEL
ANION GAP: 6 (ref 5–15)
BUN: 9 mg/dL (ref 6–20)
CALCIUM: 8 mg/dL — AB (ref 8.9–10.3)
CO2: 27 mmol/L (ref 22–32)
Chloride: 104 mmol/L (ref 101–111)
Creatinine, Ser: 0.63 mg/dL (ref 0.61–1.24)
GLUCOSE: 80 mg/dL (ref 65–99)
POTASSIUM: 3.6 mmol/L (ref 3.5–5.1)
Sodium: 137 mmol/L (ref 135–145)

## 2016-11-15 LAB — AMMONIA: Ammonia: 24 umol/L (ref 9–35)

## 2016-11-15 LAB — CBC
HEMATOCRIT: 29.2 % — AB (ref 39.0–52.0)
Hemoglobin: 9.3 g/dL — ABNORMAL LOW (ref 13.0–17.0)
MCH: 33.6 pg (ref 26.0–34.0)
MCHC: 31.8 g/dL (ref 30.0–36.0)
MCV: 105.4 fL — ABNORMAL HIGH (ref 78.0–100.0)
PLATELETS: 88 10*3/uL — AB (ref 150–400)
RBC: 2.77 MIL/uL — AB (ref 4.22–5.81)
RDW: 16.1 % — ABNORMAL HIGH (ref 11.5–15.5)
WBC: 8.9 10*3/uL (ref 4.0–10.5)

## 2016-11-15 MED ORDER — RISPERIDONE 0.5 MG PO TABS
0.5000 mg | ORAL_TABLET | Freq: Two times a day (BID) | ORAL | Status: DC
  Administered 2016-11-15 – 2016-11-16 (×2): 0.5 mg via ORAL
  Filled 2016-11-15 (×3): qty 1

## 2016-11-15 NOTE — Progress Notes (Signed)
PROGRESS NOTE  [name]  [id number] DOB: 1981-05-01  DOA: 10/29/2016 PCP: Nicholes Rough, PA-C   Brief Narrative:  36 year old male with a PMH of chronic hepatitis C, alcoholic hepatitis, fatty liver, ongoing occasional alcohol intake, remote IVDA, seen by OP pain MD for increasing back pain, right lower extremity cellulitis and edema, ruled out DVT, presented back to provide her when stat MRI confirmed L4-5 septic right facet arthritis and abscess within the paraspinal musculature. Due to altered mental status from delirium tremens, he had to be transferred to ICU, intubated and was under CCM care. Orthopedics, [facility], infectious disease assisted with management. He underwent back and right lower extremity surgery. Extubated and stabilized. Care transferred over to [facility] on 11/06/16.   Assessment & Plan:   Principal Problem:   Epidural abscess Active Problems:   Liver failure (HCC)   Hyponatremia   Cellulitis of right lower extremity   EtOH dependence (HCC)   Thrombocytopenia (HCC)   Cirrhosis (HCC)   Paraspinal abscess (HCC)   Chronic hepatitis C without hepatic coma (HCC)   Hepatitis   Abnormal transaminases   Alcoholic hepatitis without ascites   ETOH abuse   Abscess of right lower extremity   Cellulitis and abscess of right lower extremity   Hypernatremia   Delirium tremens (HCC)   Infection of lumbar spine (HCC)   Pyogenic arthritis of multiple sites [facility])   Traumatic compartment syndrome of other sites [facility])   Alcoholic liver failure ([place])   Traumatic compartment syndrome of lower extremity ([place])   Alcoholic cirrhosis of liver without ascites ([place])   Goals of care, counseling/discussion   Palliative care by specialist   Infection   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 empirically  on IV vancomycin and Zosyn was changed to cefepime, ID recommending total 6 weeks of IV Rocephin, history of remote  IV drug abuse, more than 15 years ago, discussed with ID, patient and his mother, patient can be discharged with PICC line for IV antibiotics,  mother will provide care at home with 24/7 supervision from her and multiple family members. - Status post Decompressive posterior lumbar laminectomy level IV-V by orthopedics on 11/01/16, again to OR on 12/15 for lumbar wound debridement, and wound VAC insertion, plan to go again on Monday for VAC dressing change - Post surgery orthopedics recommended mobilization with PT, upper therapy and TLSO brace. PT and OT evaluated and recommended [name].  Right posterior deep thigh abscess/pyomyositis:  - Status post I&D by orthopedics on 11/01/16. Followed by ortho. Looks clean, no discharge or losing  Acute hepatic failure/Jaundice/hepatitis C/fatty liver:  - History of hepatitis C and fatty liver. History of severe alcoholic hepatitis [id number]. Never followed through with planned treatment with [facility] after failing some unspecified antiviral in 2016. MELD=24 & discriminant function 66 consistent with recurrent alcoholic hepatitis. GI was consulted. Patient was started on prednisone 40 mg daily 30 days which was switched to IV Solu-Medrol while he was intubated, for alcoholic hepatitis. Steroids were then stopped on 11/02/16 due to ongoing infection. As per GI, alcoholic hepatitis typically takes 1-3 months to resolve and he is at increased risk for hepatic decompensation, morbidity and mortality from this and this risk will not likely change for 1-2 months. - Avoid alcohol completely and acetaminophen.  - He was also placed on lactulose and rifaximin. LFTs improving. - He has to follow up with his primary GI doctor (Dr. Alice Reichert) in 3-4 weeks from discharge.  Alcohol abuse and likely dependence:  - On 10/31/16, patient developed progressive altered mental status due to delirium tremens and he was transferred to ICU. He was started on Precedex drip and multivitamins. - Delirium  resolved, no further evidence of withdrawals, mentation back to baseline  Coagulopathy - Secondary to acute liver failure. No bleeding reported. Required vitamin K and FFP before surgery  Thrombocytopenia - Secondary to liver disease. Treating  Hyponatremia - Resolved.  Acute hypoxic respiratory failure secondary to unable to protect airways due to DTs:  - Transfer to ICU. Intubated 11/01/16. CCM managed. Patient self extubated on 11/04/16. Maintaining airway. - Currently on room air.  Acute encephalopathy secondary to delirium tremens/hepatic encephalopathy:  - Remains on rifaximin and lactulose. [facility] assisted with management. DTs improved but patient remained confused. His mental status changes were felt to be multifactorial secondary to medications in addition to hepatic encephalopathy and ongoing infections. Sedative medications were minimized. Ammonia has normalized.  - Remains on risperidone 1 MG twice a day will start tapering today. - Mental status back to baseline  Hypoglycemia: - Resolved. Speech therapy evaluated and recommended a regular consistency diet.  Hypokalemia and hypomagnesemia - Replace and follow as needed.  Anemia - Stable.  Goals of care:  As per chart review, patient's brother indicated that patient's significant other is not his wife and patient's mother should be in charge of making healthcare decisions, CODE STATUS and short-term versus long-term goals care. PMT's onboard to further evaluate case and assist.   DVT prophylaxis: SCDs(unable to do any chemical prophylaxis given coagulopathy and thrombocytopenia) Code Status: Full Family Communication: None at bedside Disposition Plan: Pending clinical course and improvement,   Consultants:   Orthopedics  [facility]  Infectious disease  CCM  Rehabilitation M.D.  Procedures:   Intubated for mechanical ventilation 11/01/16  Central line 11/01/16-Discontinued   I&D of right posterior deep  deep thigh abscess by orthopedics on 11/01/16  Decompressive posterior lumbar laminectomy level IV-V by orthopedics on 11/01/16  NG tube feeding-discontinued.  Arterial line-discontinued  Foley catheter  Antimicrobials:   IV Rocephin  Rifaximin     Subjective: Seen this morning.no acute events overnight, reports pain in right lower thigh is controlled , no nausea, no vomiting, no chest pain or shortness of breath Objective:  Vitals:   11/14/16 0800 11/14/16 1517 11/14/16 2100 11/15/16 0500  BP: 110/70 120/60 124/75 115/64  Pulse: 62 75 91 95  Resp: 14 18 15 16   Temp: 98.2 F (36.8 C) 98.6 F (37 C) 97.7 F (36.5 C) 98.5 F (36.9 C)  TempSrc: Oral Oral Oral Oral  SpO2: 98%   98%  Weight:      Height:        Intake/Output Summary (Last 24 hours) at 11/15/16 1234 Last data filed at 11/15/16 0500  Gross per 24 hour  Intake              663 ml  Output             2985 ml  Net            -2322 ml   Filed Weights   11/11/16 0526 11/12/16 0539 11/13/16 0522  Weight: 108.9 kg (240 lb) 110 kg (242 lb 8.1 oz) 106.7 kg (235 lb 4.8 oz)    Examination:  General exam: Pleasant young male Lying in bed eating breakfast Respiratory system: Clear to auscultation. Respiratory effort normal. No wheezing Cardiovascular system: S1 & S2 heard, RRR. No JVD, murmurs,  rubs, gallops or clicks. No pedal edema.  Gastrointestinal system: Abdomen is nondistended, soft and nontender. Normal bowel sounds heard. Extremities: Symmetric 5 x 5 power. No edema Skin: No rashes, lesions or ulcers. Lumbar spine surgical site dressing With wound VAC. Right posterior thigh with no discharge on dressing today.Marland Kitchen Psychiatry: Awake, alert 3 , appropriate    Data Reviewed: I have personally reviewed following labs and imaging studies  CBC:  Recent Labs Lab 11/09/16 1527 11/11/16 0537 11/12/16 0800 11/15/16 0506  WBC 11.4* 10.0 7.1 8.9  HGB 9.0* 9.2* 9.3* 9.3*  HCT 28.3* 29.2* 28.8* 29.2*  MCV  105.6* 106.2* 105.9* 105.4*  PLT 132* 108* 85* 88*   Basic Metabolic Panel:  Recent Labs Lab 11/09/16 1527 11/11/16 0537 11/12/16 0800 11/15/16 0506  NA 138 136 137 137  K 3.6 3.5 3.7 3.6  CL 109 106 108 104  CO2 24 23 23 27   GLUCOSE 104* 86 87 80  BUN <5* 5* 5* 9  CREATININE 0.68 0.69 0.59* 0.63  CALCIUM 8.0* 8.0* 8.1* 8.0*  MG 1.7  --   --   --    GFR: Estimated Creatinine Clearance: 160.2 mL/min (by C-G formula based on SCr of 0.63 mg/dL). Liver Function Tests:  Recent Labs Lab 11/09/16 1527 11/12/16 0800  AST 115* 100*  ALT 51 50  ALKPHOS 92 84  BILITOT 6.2* 5.5*  PROT 6.1* 6.0*  ALBUMIN 1.8* 1.8*   No results for input(s): LIPASE, AMYLASE in the last 168 hours.  Recent Labs Lab 11/15/16 0652  AMMONIA 24   Coagulation Profile:  Recent Labs Lab 11/09/16 1527 11/10/16 0439 11/11/16 0537 11/12/16 0500  INR 1.93 1.95 1.91 2.00   Cardiac Enzymes: No results for input(s): CKTOTAL, CKMB, CKMBINDEX, TROPONINI in the last 168 hours. BNP (last 3 results) No results for input(s): PROBNP in the last 8760 hours. HbA1C: No results for input(s): HGBA1C in the last 72 hours. CBG:  Recent Labs Lab 11/08/16 1955 11/09/16 0044 11/09/16 0911 11/09/16 1125 11/12/16 1316  GLUCAP 95 83 124* 105* 88   Lipid Profile: No results for input(s): CHOL, HDL, LDLCALC, TRIG, CHOLHDL, LDLDIRECT in the last 72 hours. Thyroid Function Tests: No results for input(s): TSH, T4TOTAL, FREET4, T3FREE, THYROIDAB in the last 72 hours. Anemia Panel: No results for input(s): VITAMINB12, FOLATE, FERRITIN, TIBC, IRON, RETICCTPCT in the last 72 hours.  Sepsis Labs:  Recent Labs Lab 11/09/16 1527 11/11/16 0537 11/12/16 0800 11/15/16 0506  WBC 11.4* 10.0 7.1 8.9    No results found for this or any previous visit (from the past 240 hour(s)).       Radiology Studies: No results found.      Scheduled Meds: . cefTRIAXone (ROCEPHIN)  IV  2 g Intravenous Q24H  .  folic acid  1 mg Oral Daily  . lactulose  30 g Oral BID  . multivitamin with minerals  1 tablet Oral Daily  . pantoprazole sodium  40 mg Oral Daily  . protein supplement shake  11 oz Oral BID BM  . risperiDONE  1 mg Oral Q12H  . sodium chloride flush  3 mL Intravenous Q12H  . thiamine  100 mg Oral Daily   Continuous Infusions: . sodium chloride    . lactated ringers Stopped (11/12/16 1733)     LOS: 17 days      Blane Worthington, MD [facility] Pager [phone]  If 7PM-7AM, please contact night-coverage [email or web address] Password [facility] 11/15/2016, 12:34 PM

## 2016-11-15 NOTE — Progress Notes (Signed)
Subjective: 3 Days Post-Op Procedure(s) (LRB): LUMBAR WOUND DEBRIDEMENT (N/A) Patient reports pain as mild.  Reports pain well controlled. Will be getting up to walk hallway soon. No c/o. Seen by myself and Dr. Onnie Graham.  Objective: Vital signs in last 24 hours: Temp:  [97.7 F (36.5 C)-98.6 F (37 C)] 98.5 F (36.9 C) (02/18 0500) Pulse Rate:  [75-95] 95 (02/18 0500) Resp:  [15-18] 16 (02/18 0500) BP: (115-124)/(60-75) 115/64 (02/18 0500) SpO2:  [98 %] 98 % (02/18 0500)  Intake/Output from previous day: 02/17 0701 - 02/18 0700 In: [id number] [P.O.:600; I.V.:3] Out: 2985 [Urine:2925; Drains:60] Intake/Output this shift: No intake/output data recorded.   Recent Labs  11/15/16 0506  HGB 9.3*    Recent Labs  11/15/16 0506  WBC 8.9  RBC 2.77*  HCT 29.2*  PLT 88*    Recent Labs  11/15/16 0506  NA 137  K 3.6  CL 104  CO2 27  BUN 9  CREATININE 0.63  GLUCOSE 80  CALCIUM 8.0*   No results for input(s): LABPT, INR in the last 72 hours.  Neurologically intact ABD soft Neurovascular intact Sensation intact distally Intact pulses distally Dorsiflexion/Plantar flexion intact Incision: dressing C/D/I and no drainage No cellulitis present Compartment soft no sign of DVT  Assessment/Plan: 3 Days Post-Op Procedure(s) (LRB): LUMBAR WOUND DEBRIDEMENT (N/A) Advance diet Up with therapy - brace when OOB Back to OR tomorrow for vac change, keep NPO p MN order placed  Kyleah Pensabene M. 11/15/2016, 10:20 AM

## 2016-11-16 ENCOUNTER — Encounter (HOSPITAL_COMMUNITY): Payer: Self-pay | Admitting: Physical Medicine and Rehabilitation

## 2016-11-16 ENCOUNTER — Inpatient Hospital Stay (HOSPITAL_COMMUNITY)
Admission: RE | Admit: 2016-11-16 | Discharge: 2016-11-19 | DRG: 947 | Disposition: A | Discharge status: Home Health | Payer: BLUE CROSS/BLUE SHIELD | Source: Intra-hospital | Attending: Physical Medicine & Rehabilitation | Admitting: Physical Medicine & Rehabilitation

## 2016-11-16 DIAGNOSIS — K7030 Alcoholic cirrhosis of liver without ascites: Secondary | ICD-10-CM

## 2016-11-16 DIAGNOSIS — D696 Thrombocytopenia, unspecified: Secondary | ICD-10-CM

## 2016-11-16 DIAGNOSIS — Z79899 Other long term (current) drug therapy: Secondary | ICD-10-CM | POA: Diagnosis not present

## 2016-11-16 DIAGNOSIS — L03115 Cellulitis of right lower limb: Secondary | ICD-10-CM

## 2016-11-16 DIAGNOSIS — Z87891 Personal history of nicotine dependence: Secondary | ICD-10-CM | POA: Diagnosis not present

## 2016-11-16 DIAGNOSIS — B1920 Unspecified viral hepatitis C without hepatic coma: Secondary | ICD-10-CM | POA: Diagnosis not present

## 2016-11-16 DIAGNOSIS — G8918 Other acute postprocedural pain: Secondary | ICD-10-CM

## 2016-11-16 DIAGNOSIS — R740 Nonspecific elevation of levels of transaminase and lactic acid dehydrogenase [LDH]: Secondary | ICD-10-CM

## 2016-11-16 DIAGNOSIS — G8929 Other chronic pain: Secondary | ICD-10-CM

## 2016-11-16 DIAGNOSIS — M4646 Discitis, unspecified, lumbar region: Secondary | ICD-10-CM | POA: Diagnosis not present

## 2016-11-16 DIAGNOSIS — D689 Coagulation defect, unspecified: Secondary | ICD-10-CM

## 2016-11-16 DIAGNOSIS — R5381 Other malaise: Secondary | ICD-10-CM | POA: Diagnosis present

## 2016-11-16 DIAGNOSIS — F1010 Alcohol abuse, uncomplicated: Secondary | ICD-10-CM | POA: Diagnosis present

## 2016-11-16 DIAGNOSIS — M609 Myositis, unspecified: Secondary | ICD-10-CM | POA: Diagnosis not present

## 2016-11-16 DIAGNOSIS — G061 Intraspinal abscess and granuloma: Secondary | ICD-10-CM | POA: Diagnosis not present

## 2016-11-16 DIAGNOSIS — R269 Unspecified abnormalities of gait and mobility: Secondary | ICD-10-CM

## 2016-11-16 DIAGNOSIS — F10231 Alcohol dependence with withdrawal delirium: Secondary | ICD-10-CM

## 2016-11-16 DIAGNOSIS — Z791 Long term (current) use of non-steroidal anti-inflammatories (NSAID): Secondary | ICD-10-CM | POA: Diagnosis not present

## 2016-11-16 DIAGNOSIS — M4620 Osteomyelitis of vertebra, site unspecified: Secondary | ICD-10-CM

## 2016-11-16 DIAGNOSIS — L02415 Cutaneous abscess of right lower limb: Secondary | ICD-10-CM

## 2016-11-16 DIAGNOSIS — R29898 Other symptoms and signs involving the musculoskeletal system: Secondary | ICD-10-CM

## 2016-11-16 DIAGNOSIS — K7040 Alcoholic hepatic failure without coma: Secondary | ICD-10-CM

## 2016-11-16 DIAGNOSIS — M549 Dorsalgia, unspecified: Secondary | ICD-10-CM | POA: Diagnosis not present

## 2016-11-16 DIAGNOSIS — F10239 Alcohol dependence with withdrawal, unspecified: Secondary | ICD-10-CM

## 2016-11-16 DIAGNOSIS — F10939 Alcohol use, unspecified with withdrawal, unspecified: Secondary | ICD-10-CM

## 2016-11-16 DIAGNOSIS — R7401 Elevation of levels of liver transaminase levels: Secondary | ICD-10-CM

## 2016-11-16 DIAGNOSIS — G8311 Monoplegia of lower limb affecting right dominant side: Secondary | ICD-10-CM | POA: Diagnosis not present

## 2016-11-16 DIAGNOSIS — M60051 Infective myositis, right thigh: Secondary | ICD-10-CM

## 2016-11-16 DIAGNOSIS — R748 Abnormal levels of other serum enzymes: Secondary | ICD-10-CM | POA: Diagnosis present

## 2016-11-16 LAB — C-REACTIVE PROTEIN

## 2016-11-16 MED ORDER — OXYCODONE HCL 5 MG PO TABS
5.0000 mg | ORAL_TABLET | Freq: Four times a day (QID) | ORAL | Status: DC | PRN
  Administered 2016-11-16 – 2016-11-17 (×2): 10 mg via ORAL
  Filled 2016-11-16 (×2): qty 2

## 2016-11-16 MED ORDER — FOLIC ACID 1 MG PO TABS: 1.0000 mg | ORAL_TABLET | Freq: Every day | ORAL | Status: DC

## 2016-11-16 MED ORDER — MENTHOL 3 MG MT LOZG
1.0000 | LOZENGE | OROMUCOSAL | Status: DC | PRN
  Filled 2016-11-16: qty 9

## 2016-11-16 MED ORDER — PANTOPRAZOLE SODIUM 40 MG PO PACK: 40.0000 mg | PACK | Freq: Every day | ORAL | Status: DC

## 2016-11-16 MED ORDER — OXYCODONE HCL 10 MG PO TABS: 5.0000 mg | ORAL_TABLET | ORAL | 0 refills | Status: DC | PRN

## 2016-11-16 MED ORDER — LACTULOSE 10 GM/15ML PO SOLN: 30.0000 g | Freq: Two times a day (BID) | ORAL | 0 refills | Status: DC

## 2016-11-16 MED ORDER — PANTOPRAZOLE SODIUM 40 MG PO PACK
40.0000 mg | PACK | Freq: Every day | ORAL | Status: DC
  Administered 2016-11-17 – 2016-11-19 (×3): 40 mg via ORAL
  Filled 2016-11-16 (×3): qty 20

## 2016-11-16 MED ORDER — ONDANSETRON HCL 4 MG PO TABS: 4.0000 mg | ORAL_TABLET | Freq: Four times a day (QID) | ORAL | 0 refills | Status: DC | PRN

## 2016-11-16 MED ORDER — PHENOL 1.4 % MT LIQD: 1.0000 | OROMUCOSAL | Status: DC | PRN

## 2016-11-16 MED ORDER — ACETAMINOPHEN 325 MG PO TABS: 325.0000 mg | ORAL_TABLET | ORAL | Status: DC | PRN

## 2016-11-16 MED ORDER — ADULT MULTIVITAMIN W/MINERALS CH: 1.0000 | ORAL_TABLET | Freq: Every day | ORAL | Status: AC

## 2016-11-16 MED ORDER — SODIUM CHLORIDE 0.9% FLUSH: 10.0000 mL | Freq: Two times a day (BID) | INTRAVENOUS | Status: DC

## 2016-11-16 MED ORDER — PREMIER PROTEIN SHAKE
11.0000 [oz_av] | Freq: Two times a day (BID) | ORAL | Status: DC
  Administered 2016-11-17 – 2016-11-19 (×2): 11 [oz_av] via ORAL
  Filled 2016-11-16 (×10): qty 325.31

## 2016-11-16 MED ORDER — LACTULOSE 10 GM/15ML PO SOLN
30.0000 g | Freq: Two times a day (BID) | ORAL | Status: DC
  Administered 2016-11-16 – 2016-11-19 (×6): 30 g via ORAL
  Filled 2016-11-16 (×9): qty 45

## 2016-11-16 MED ORDER — ALUM & MAG HYDROXIDE-SIMETH 200-200-20 MG/5ML PO SUSP
30.0000 mL | ORAL | Status: DC | PRN
Start: 2016-11-16 — End: 2016-11-19

## 2016-11-16 MED ORDER — DEXTROSE 5 % IV SOLN: 2.0000 g | INTRAVENOUS | Status: DC

## 2016-11-16 MED ORDER — RISPERIDONE 1 MG PO TABS
0.5000 mg | ORAL_TABLET | Freq: Two times a day (BID) | ORAL | Status: DC
  Administered 2016-11-16 – 2016-11-18 (×4): 0.5 mg via ORAL
  Filled 2016-11-16 (×4): qty 1

## 2016-11-16 MED ORDER — BISACODYL 10 MG RE SUPP: 10.0000 mg | Freq: Every day | RECTAL | Status: DC | PRN

## 2016-11-16 MED ORDER — PROCHLORPERAZINE MALEATE 5 MG PO TABS: 5.0000 mg | ORAL_TABLET | Freq: Four times a day (QID) | ORAL | Status: DC | PRN

## 2016-11-16 MED ORDER — ONDANSETRON HCL 4 MG/2ML IJ SOLN: 4.0000 mg | Freq: Four times a day (QID) | INTRAMUSCULAR | Status: DC | PRN

## 2016-11-16 MED ORDER — DIPHENHYDRAMINE HCL 12.5 MG/5ML PO ELIX: 12.5000 mg | ORAL_SOLUTION | Freq: Four times a day (QID) | ORAL | Status: DC | PRN

## 2016-11-16 MED ORDER — PROCHLORPERAZINE EDISYLATE 5 MG/ML IJ SOLN: 5.0000 mg | Freq: Four times a day (QID) | INTRAMUSCULAR | Status: DC | PRN

## 2016-11-16 MED ORDER — TRAZODONE HCL 50 MG PO TABS: 25.0000 mg | ORAL_TABLET | Freq: Every evening | ORAL | Status: DC | PRN

## 2016-11-16 MED ORDER — PRO-STAT SUGAR FREE PO LIQD
30.0000 mL | Freq: Two times a day (BID) | ORAL | Status: DC
  Administered 2016-11-16 – 2016-11-18 (×3): 30 mL via ORAL
  Filled 2016-11-16 (×6): qty 30

## 2016-11-16 MED ORDER — FOLIC ACID 1 MG PO TABS
1.0000 mg | ORAL_TABLET | Freq: Every day | ORAL | Status: DC
  Administered 2016-11-17 – 2016-11-19 (×3): 1 mg via ORAL
  Filled 2016-11-16 (×3): qty 1

## 2016-11-16 MED ORDER — ONDANSETRON HCL 4 MG PO TABS: 4.0000 mg | ORAL_TABLET | Freq: Four times a day (QID) | ORAL | Status: DC | PRN

## 2016-11-16 MED ORDER — PREMIER PROTEIN SHAKE: 11.0000 [oz_av] | Freq: Two times a day (BID) | ORAL | 0 refills | Status: DC

## 2016-11-16 MED ORDER — ADULT MULTIVITAMIN W/MINERALS CH
1.0000 | ORAL_TABLET | Freq: Every day | ORAL | Status: DC
  Administered 2016-11-17 – 2016-11-19 (×3): 1 via ORAL
  Filled 2016-11-16 (×3): qty 1

## 2016-11-16 MED ORDER — RISPERIDONE 0.5 MG PO TABS: 0.2500 mg | ORAL_TABLET | Freq: Every day | ORAL | Status: DC

## 2016-11-16 MED ORDER — VITAMIN B-1 100 MG PO TABS
100.0000 mg | ORAL_TABLET | Freq: Every day | ORAL | Status: DC
  Administered 2016-11-17 – 2016-11-19 (×3): 100 mg via ORAL
  Filled 2016-11-16 (×3): qty 1

## 2016-11-16 MED ORDER — GUAIFENESIN-DM 100-10 MG/5ML PO SYRP: 5.0000 mL | ORAL_SOLUTION | Freq: Four times a day (QID) | ORAL | Status: DC | PRN

## 2016-11-16 MED ORDER — METHOCARBAMOL 500 MG PO TABS
500.0000 mg | ORAL_TABLET | Freq: Four times a day (QID) | ORAL | Status: DC | PRN
  Administered 2016-11-17 – 2016-11-19 (×4): 500 mg via ORAL
  Filled 2016-11-16 (×4): qty 1

## 2016-11-16 MED ORDER — THIAMINE HCL 100 MG PO TABS: 100.0000 mg | ORAL_TABLET | Freq: Every day | ORAL | Status: DC

## 2016-11-16 MED ORDER — FLEET ENEMA 7-19 GM/118ML RE ENEM: 1.0000 | ENEMA | Freq: Once | RECTAL | Status: DC | PRN

## 2016-11-16 MED ORDER — TRAMADOL HCL 50 MG PO TABS: 25.0000 mg | ORAL_TABLET | Freq: Four times a day (QID) | ORAL | Status: DC | PRN

## 2016-11-16 MED ORDER — POLYETHYLENE GLYCOL 3350 17 G PO PACK: 17.0000 g | PACK | Freq: Every day | ORAL | Status: DC | PRN

## 2016-11-16 MED ORDER — SODIUM CHLORIDE 0.9% FLUSH
10.0000 mL | INTRAVENOUS | Status: DC | PRN
  Administered 2016-11-16 – 2016-11-18 (×4): 10 mL
  Filled 2016-11-16 (×4): qty 40

## 2016-11-16 MED ORDER — CEFTRIAXONE SODIUM 2 G IJ SOLR
2.0000 g | INTRAMUSCULAR | Status: DC
  Administered 2016-11-17 – 2016-11-18 (×2): 2 g via INTRAVENOUS
  Filled 2016-11-16 (×3): qty 2

## 2016-11-16 MED ORDER — PROCHLORPERAZINE 25 MG RE SUPP: 12.5000 mg | Freq: Four times a day (QID) | RECTAL | Status: DC | PRN

## 2016-11-16 NOTE — Discharge Summary (Signed)
[name], is a 36 y.o. male  DOB 09-Mar-1981  MRN [id number].  Admission date:  10/29/2016  Admitting Physician  Elmarie Shiley, MD  Discharge Date:  11/16/2016   Primary MD  Nicholes Rough, PA-C  Recommendations for primary care physician for things to follow:  - Patient to continue Rocephin through 12/13/2016, PICC line can be discontinued after that - Continue wound VAC dressing change every Monday Wednesday Friday by one tobacco nurse. - Orthopedic to follow patient regarding right posterior thigh wound, Dr. 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] Cirrhosis ([place]) [K74.60] Hepatitis [K75.9] Paraspinal abscess ([place]) [M46.20] Cellulitis of right lower extremity [H82.993] Pre-op evaluation [Z01.818] Abnormal transaminases [R74.8]    Principal Problem:   Epidural abscess Active Problems:   Liver failure (HCC)   Hyponatremia   Cellulitis of right lower extremity   EtOH dependence (HCC)   Thrombocytopenia (HCC)   Cirrhosis ([place])   Paraspinal abscess (HCC)   Chronic hepatitis C without hepatic coma (HCC)   Hepatitis   Abnormal transaminases   Alcoholic hepatitis without ascites   ETOH abuse   Abscess of right lower extremity   Cellulitis and abscess of right lower extremity   Hypernatremia   Delirium tremens (HCC)   Infection of lumbar spine (HCC)   Pyogenic arthritis of multiple sites [facility])   Traumatic compartment syndrome of other sites [facility])   Alcoholic liver failure (HCC)   Traumatic compartment syndrome of lower extremity ([place])   Alcoholic cirrhosis of liver without ascites ([place])   Goals of care, counseling/discussion  Palliative care by specialist   Infection      Past Medical History:  Diagnosis Date  . Hepatitis C     Past Surgical History:  Procedure Laterality Date  . DECOMPRESSIVE LUMBAR LAMINECTOMY LEVEL 1 N/A 11/01/2016   Procedure: DECOMPRESSIVE POSTERIOR LUMBAR LAMINECTOMY LEVEL 4-5;  Surgeon: Melina Schools, MD;  Location: [place];  Service: Orthopedics;  Laterality: N/A;  . HERNIA REPAIR    . I&D EXTREMITY Right 11/01/2016   Procedure: IRRIGATION AND DEBRIDEMENT ABSCESS RIGHT THIGH;  Surgeon: Melina Schools, MD;  Location: [place];  Service: Orthopedics;  Laterality: Right;  . LUMBAR WOUND DEBRIDEMENT N/A 11/12/2016   Procedure: LUMBAR WOUND DEBRIDEMENT;  Surgeon: Melina Schools, MD;  Location: [place];  Service: Orthopedics;  Laterality: N/A;       History of present illness and  Hospital Course:     Kindly see H&P for history of present illness and admission details, please review complete Labs, Consult reports and Test reports for all details in brief  HPI  from the history and physical done on the day of admission 10/29/2016  HPI: [name] is a 36 y.o. male with medical history significant of hepatitis C, cirrhosis per wife, last records LFT AST 74, AST 163, Alkaline phosphatase 119, Bili  1.2, still drinks occasional alcohol, not significant amount per wife, he presents complaining of worsening back pain and right lower extremity edema and redness. He report having back pain since January 19. He was seeing in the ED and discharge on pain regimen. He went to see Dr Nelva Bush with ortho and underwent MRI which showed L 4 -5 right facet joint septic arthritis, and soft tissue abscess within the adjacent posterior Paraspinous musculature 1.7* 1.3* 4.6.  He report pain as sharp, shooting pain, radiates to right LE. Is difficult for him to walk due to pain and swelling right LE.  He denies chest pain or dyspnea. No BM in 3 days. His wife just notice that he became jaundice since yesterday. Also wife  report some confusion early today.   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 Care transferred over to [facility] on 11/06/16. Mental status significantly improved, back to baseline, lumbar and upper thigh wounds are improving.   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 empirically  on IV vancomycin and Zosyn was changed to cefepime, ID recommending total 6 weeks of IV Rocephin - PICC line inserted, history of remote IV drug abuse, more than 15 years ago, discussed with ID, patient and his mother, patient can be discharged with PICC line for IV antibiotics,  mother will provide care at home with 24/7 supervision from her and multiple family members. - Status post Decompressive posterior lumbar laminectomy level IV-V by orthopedics on 11/01/16, again to OR on 12/15 for lumbar wound debridement, and wound VAC insertion, discussed with Dr. Rolena Infante , wound [facility] dressing change at bedside today, recommendation to change every Monday Wednesday Friday by a wound care nurse, possibly wound closure next week - Post surgery orthopedics recommended mobilization with PT, upper therapy and TLSO brace. PT and OT evaluated and recommended [name].  Right posterior deep thigh abscess/pyomyositis:  - Status post I&D by  orthopedics on 11/01/16. Followed by ortho. Looks clean, no discharge or oozing, case with Dr. Francee Piccolo, he will evaluate wound today  Acute hepatic failure/Jaundice/hepatitis C/fatty liver:  - History of hepatitis C and fatty liver. History of severe alcoholic hepatitis 4259. Never followed through with planned treatment with [facility] after failing some unspecified antiviral in 2016. MELD=24 & discriminant function 66 consistent with recurrent alcoholic hepatitis. GI was consulted. Patient was started on prednisone 40 mg daily 30 days which was switched to IV Solu-Medrol while he was intubated, for alcoholic hepatitis. Steroids were then stopped on 11/02/16 due to ongoing infection. As per GI, alcoholic hepatitis typically takes 1-3 months to resolve and he is at increased risk for hepatic decompensation, morbidity and mortality from this and this risk will not likely change for 1-2 months. - Avoid alcohol completely and acetaminophen.  - He was also placed on lactulose and rifaximin. LFTs improving. - He has to follow  up with his primary GI doctor (Dr. Alice Reichert) in 3-4 weeks from discharge.  Alcohol abuse and likely dependence:  - On 10/31/16, patient developed progressive altered mental status due to delirium tremens and he was transferred to ICU. He was started on Precedex drip and multivitamins. - Delirium resolved, no further evidence of withdrawals, mentation back to baseline  Coagulopathy - Secondary to acute liver failure. No bleeding reported. Required vitamin K and FFP before surgery  Thrombocytopenia - Secondary to liver disease. Treating  Hyponatremia - Resolved.  Acute hypoxic respiratory failure secondary to unable to protect airways due to DTs:  - Transfer to ICU. Intubated 11/01/16. CCM managed. Patient self extubated on 11/04/16. Maintaining airway. - Currently on room air.  Acute encephalopathy secondary to delirium tremens/hepatic encephalopathy:  - Placed on rifaximin and  lactulose.  GI assisted with management. DTs improved but patient remained confused. His mental status changes were felt to be multifactorial secondary to medications in addition to hepatic encephalopathy and ongoing infections. Sedative medications were minimized. Ammonia has normalized.  Duanne Guess on the risperidone, tapered gradually, can be stopped on 2/22 - Mental status back to baseline - Rifaximin has been stopped, continue with lactulose  Hypoglycemia: - Resolved. Speech therapy evaluated and recommended a regular consistency diet.  Hypokalemia and hypomagnesemia - Replace and follow as needed.  Anemia - Stable.   Discharge Condition:  Stable      Discharge Instructions  and  Discharge Medications     Discharge Instructions    Discharge instructions    Complete by:  As directed    Management per [name], check CBC, BMP every 72 hours, patient on regular diet, wound VAC change by 1 care nurse every Monday, Wednesday and Friday.   Incentive spirometry RT    Complete by:  As directed    Increase activity slowly    Complete by:  As directed      Allergies as of 11/16/2016   No Known Allergies     Medication List    STOP taking these medications   cyclobenzaprine 10 MG tablet Commonly known as:  FLEXERIL   diclofenac sodium 1 % Gel Commonly known as:  VOLTAREN   HYDROcodone-acetaminophen 5-325 MG tablet Commonly known as:  NORCO/VICODIN   naproxen 500 MG tablet Commonly known as:  NAPROSYN   predniSONE 20 MG tablet Commonly known as:  DELTASONE   promethazine 25 MG tablet Commonly known as:  PHENERGAN     TAKE these medications   cefTRIAXone 2 g in dextrose 5 % 50 mL Inject 2 g into the vein daily. to continue Rocephin through 7/51/7001   folic acid 1 MG tablet Commonly known as:  FOLVITE Take 1 tablet (1 mg total) by mouth daily. Start taking on:  11/17/2016   lactulose 10 GM/15ML solution Commonly known as:  CHRONULAC Take 45 mLs (30 g  total) by mouth 2 (two) times daily.   multivitamin with minerals Tabs tablet Take 1 tablet by mouth daily. Start taking on:  11/17/2016   ondansetron 4 MG tablet Commonly known as:  ZOFRAN Take 1 tablet (4 mg total) by mouth every 6 (six) hours as needed for nausea.   Oxycodone HCl 10 MG Tabs Take 0.5 tablets (5 mg total) by mouth every 4 (four) hours as needed for severe pain.   pantoprazole sodium 40 mg/20 mL Pack Commonly known as:  PROTONIX Take 20 mLs (40 mg total) by mouth daily. Start taking on:  11/17/2016   protein supplement shake Liqd  Commonly known as:  PREMIER PROTEIN Take 325 mLs (11 oz total) by mouth 2 (two) times daily between meals. Start taking on:  11/17/2016   risperiDONE 0.5 MG tablet Commonly known as:  RISPERDAL Take 0.5 tablets (0.25 mg total) by mouth daily.   thiamine 100 MG tablet Take 1 tablet (100 mg total) by mouth daily. Start taking on:  11/17/2016         Diet and Activity recommendation: See Discharge Instructions above   Consults obtained -   Orthopedics  [facility]  Infectious disease  CCM  Rehabilitation M.D   Major procedures and Radiology Reports - PLEASE review detailed and final reports for all details, in brief -   Intubated for mechanical ventilation 11/01/16  Central line 11/01/16-Discontinued   I&D of right posterior deep deep thigh abscess by orthopedics on 11/01/16  Decompressive posterior lumbar laminectomy level IV-V by orthopedics on 11/01/16  NG tube feeding-discontinued.  Arterial line-discontinued  Foley catheter    Dg Lumbar Spine 2-3 Views  Result Date: 11/01/2016 CLINICAL DATA:  Post lumbar laminectomy. EXAM: LUMBAR SPINE - 2-3 VIEW COMPARISON:  None. FINDINGS: Surgical probe positioned over the posterior elements at the level of the L4-5 disc space. IMPRESSION: Surgical probe with tip overlying the posterior elements at the L4-5 disc space level. Discussed with Dr. Rolena Infante in the [place]. Electronically  Signed   By: Franki Cabot M.D.   On: 11/01/2016 12:11   Mr Tibia Fibula Right W Wo Contrast  Result Date: 10/31/2016 CLINICAL DATA:  Severe right lower extremity pain and swelling. History of diskitis and osteomyelitis. EXAM: MRI OF THE RIGHT FEMUR WITHOUT AND WITH CONTRAST; MRI OF LOWER RIGHT EXTREMITY WITHOUT AND WITH CONTRAST TECHNIQUE: Multiplanar, multisequence MR imaging of the right lower extremity was performed both before and after administration of intravenous contrast. CONTRAST:  46m MULTIHANCE GADOBENATE DIMEGLUMINE 529 MG/ML IV SOLN COMPARISON:  None. FINDINGS: The hip and knee joints are grossly maintained. No definite findings for septic arthritis or osteomyelitis. There is extensive pyomyositis involving the posterior compartment of the thigh. Multiple large abscesses surrounding the hamstring muscles. This also rim enhancing fluid/ abscess along the entire course of the sciatic/femoral nerve which is markedly inflamed. Diffuse subcutaneous soft tissue swelling/ edema involving both lower extremities, right greater than left consistent with cellulitis. I do not see any significant findings below-the-knee other than cellulitis. No myofasciitis or pyomyositis. No osteomyelitis or septic arthritis. IMPRESSION: 1. Extensive abscesses involving the posterior compartment of the thigh along with myofasciitis and cellulitis. There is also abscess all along the course of the sciatic/femoral nerve which is thickened and inflamed. 2. No findings for septic arthritis or osteomyelitis. 3. Cellulitis below the knee but no myofasciitis or pyomyositis. These results will be called to the ordering clinician or representative by the Radiologist Assistant, and communication documented in the PACS or zVision Dashboard. Electronically Signed   By: PMarijo SanesM.D.   On: 10/31/2016 08:24   Mr Femur Right W Wo Contrast  Result Date: 10/31/2016 CLINICAL DATA:  Severe right lower extremity pain and swelling.  History of diskitis and osteomyelitis. EXAM: MRI OF THE RIGHT FEMUR WITHOUT AND WITH CONTRAST; MRI OF LOWER RIGHT EXTREMITY WITHOUT AND WITH CONTRAST TECHNIQUE: Multiplanar, multisequence MR imaging of the right lower extremity was performed both before and after administration of intravenous contrast. CONTRAST:  264mMULTIHANCE GADOBENATE DIMEGLUMINE 529 MG/ML IV SOLN COMPARISON:  None. FINDINGS: The hip and knee joints are grossly maintained. No definite findings for septic arthritis  or osteomyelitis. There is extensive pyomyositis involving the posterior compartment of the thigh. Multiple large abscesses surrounding the hamstring muscles. This also rim enhancing fluid/ abscess along the entire course of the sciatic/femoral nerve which is markedly inflamed. Diffuse subcutaneous soft tissue swelling/ edema involving both lower extremities, right greater than left consistent with cellulitis. I do not see any significant findings below-the-knee other than cellulitis. No myofasciitis or pyomyositis. No osteomyelitis or septic arthritis. IMPRESSION: 1. Extensive abscesses involving the posterior compartment of the thigh along with myofasciitis and cellulitis. There is also abscess all along the course of the sciatic/femoral nerve which is thickened and inflamed. 2. No findings for septic arthritis or osteomyelitis. 3. Cellulitis below the knee but no myofasciitis or pyomyositis. These results will be called to the ordering clinician or representative by the Radiologist Assistant, and communication documented in the PACS or zVision Dashboard. Electronically Signed   By: Marijo Sanes M.D.   On: 10/31/2016 08:24   Dg Chest Port 1 View  Result Date: 11/03/2016 CLINICAL DATA:  Repositioning of endotracheal tube. Tube was pulled earlier today. EXAM: PORTABLE CHEST 1 VIEW COMPARISON:  11/01/2016 FINDINGS: Tip of endotracheal tube is 3.3 cm above the carina in satisfactory position. Left IJ central line catheter is noted  at the cavoatrial junction. No pneumothorax. Gastric tube extends below the left hemidiaphragm and appears to coil back towards the expected location of the gastric cardia. Low lung volumes with crowding of interstitial lung markings. Stable cardiomegaly retrocardiac consolidations are not entirely excluded. IMPRESSION: 1. Satisfactory support line and tube positions. 2. Stable cardiomegaly with mild interstitial edema. Cannot exclude trace right effusion nor retrocardiac pulmonary consolidation versus atelectasis. Electronically Signed   By: Ashley Royalty M.D.   On: 11/03/2016 14:59   Dg Chest Port 1 View  Result Date: 11/01/2016 CLINICAL DATA:  Life support line placement. History of hepatitis-C and epidural abscess. EXAM: PORTABLE CHEST 1 VIEW COMPARISON:  Chest radiograph October 29, 2016 FINDINGS: LEFT internal jugular central venous catheter distal tip projects at cavoatrial junction. Endotracheal tube tip projects 4 cm above the carina. Nasogastric tube tip projects in proximal stomach. The cardiac silhouette is mildly enlarged. Low inspiratory examination with crowded, mildly engorged vascular markings and mild interstitial prominence. No pleural effusion. No focal consolidation. No pneumothorax. Large body habitus. Osseous structures are nonsuspicious. IMPRESSION: LEFT internal jugular central venous catheter distal tip projects at cavoatrial junction. Endotracheal tube tip projects 4 cm above the carina. Nasogastric tube tip projects in proximal stomach. Stable cardiomegaly and mild interstitial prominence. Electronically Signed   By: Elon Alas M.D.   On: 11/01/2016 01:23   Dg Chest Port 1 View  Result Date: 10/29/2016 CLINICAL DATA:  Preop evaluation prior treatment for epidural abscess and cellulitis. No current chest complaints. Current smoker. EXAM: PORTABLE CHEST 1 VIEW COMPARISON:  None in PACs FINDINGS: Today's radiograph is taken in a lordotic projection. The lungs are well-expanded.  The interstitial markings are coarse. There is no alveolar infiltrate or pleural effusion. The heart is top-normal in size. The pulmonary vascularity is not clearly engorged. The bony thorax exhibits no acute abnormality. IMPRESSION: Bilateral interstitial prominence likely reflects the patient's smoking history. No definite pneumonia nor CHF. Electronically Signed   By: David  Martinique M.D.   On: 10/29/2016 17:05   Dg Abd Portable 1v  Result Date: 11/03/2016 CLINICAL DATA:  OG tube placement EXAM: PORTABLE ABDOMEN - 1 VIEW COMPARISON:  11/01/2016 FINDINGS: Esophageal tube is looped in the stomach, the tip and side  port overlie the mid to distal stomach. Gas-filled slightly enlarged bowel loops in the upper abdomen. Improved aeration of left lung base. IMPRESSION: Esophageal tube tip projects over the mid to distal stomach Electronically Signed   By: Donavan Foil M.D.   On: 11/03/2016 23:35   Dg Abd Portable 1v  Result Date: 11/01/2016 CLINICAL DATA:  Evaluate OG tube. EXAM: PORTABLE ABDOMEN - 1 VIEW COMPARISON:  None. FINDINGS: The OG tube terminates in the left upper quadrant. Left basilar opacity and probable small effusion identified. No other acute abnormalities. IMPRESSION: 1. The OG tube terminates in the left upper quadrant. 2. Probable opacity in the left lung base. Electronically Signed   By: Dorise Bullion III M.D   On: 11/01/2016 16:54   US Abdomen Limited Ruq  Result Date: 10/29/2016 CLINICAL DATA:  Jaundice.  History of hepatitis-C. EXAM: US ABDOMEN LIMITED - RIGHT UPPER QUADRANT COMPARISON:  None. FINDINGS: Gallbladder: Moderate gallbladder mural thickening, measuring 6 mm. No calculi. No tenderness over the gallbladder. Common bile duct: Diameter: Normal, 4.5 mm. Liver: Diffusely increased echogenicity without focal lesion. IMPRESSION: 1. Diffuse hepatic parenchymal echogenicity suggesting steatosis or other hepatocellular disease. 2. Gallbladder mural thickening without calculi or Murphy's  sign. This can be seen with hepatocellular disease or portal hypertension. 3. No bile duct dilatation. 4. No ascites in the right upper quadrant. Electronically Signed   By: Andreas Newport M.D.   On: 10/29/2016 20:30    Micro Results     No results found for this or any previous visit (from the past 240 hour(s)).     Today   Subjective:   [name]  Seen this morning.no acute events overnight, reports pain in right lower thigh is controlled , no nausea, no vomiting, no chest pain or shortness of breath  Objective:   Blood pressure 123/68, pulse 95, temperature 98.5 F (36.9 C), temperature source Oral, resp. rate 15, height '5\' 11"'  (1.803 m), weight 106.2 kg (234 lb 3.2 oz), SpO2 98 %.   Intake/Output Summary (Last 24 hours) at 11/16/16 1628 Last data filed at 11/16/16 1322  Gross per 24 hour  Intake              240 ml  Output             2585 ml  Net            -2345 ml    Exam  General exam: Pleasant young male Lying in bed eating breakfast Respiratory system: Clear to auscultation. Respiratory effort normal. No wheezing Cardiovascular system: S1 & S2 heard, RRR. No JVD, murmurs, rubs, gallops or clicks. No pedal edema.  Gastrointestinal system: Abdomen is nondistended, soft and nontender. Normal bowel sounds heard. Extremities: Symmetric 5 x 5 power. No edema Skin: No rashes, lesions or ulcers. Lumbar spine surgical site dressing With wound VAC. Right posterior thigh with no discharge on dressing today.Marland Kitchen Psychiatry: Awake, alert 3 , appropriate  Data Review   CBC w Diff: Lab Results  Component Value Date   WBC 8.9 11/15/2016   HGB 9.3 (L) 11/15/2016   HCT 29.2 (L) 11/15/2016   PLT 88 (L) 11/15/2016   LYMPHOPCT 21 11/08/2016   BANDSPCT 7 11/08/2016   MONOPCT 6 11/08/2016   EOSPCT 2 11/08/2016   BASOPCT 2 11/08/2016    CMP: Lab Results  Component Value Date   NA 137 11/15/2016   K 3.6 11/15/2016   CL 104 11/15/2016   CO2 27 11/15/2016   BUN 9  11/15/2016   CREATININE 0.63 11/15/2016   PROT 6.0 (L) 11/12/2016   ALBUMIN 1.8 (L) 11/12/2016   BILITOT 5.5 (H) 11/12/2016   ALKPHOS 84 11/12/2016   AST 100 (H) 11/12/2016   ALT 50 11/12/2016  .   Total Time in preparing paper work, data evaluation and todays exam - 35 minutes  Shanyla Marconi M.D on 11/16/2016 at 4:28 PM  [facility]   Office  [phone]

## 2016-11-16 NOTE — Clinical Social Work Note (Signed)
CSW met with patient, mom, and Dr. Rolena Infante at bedside. Per ortho, the plan will be to do bedside dressing changes on Wednesday, Friday, then Monday. Ortho will re-evaluate to determine if incision can be closed possibly next Wednesday. CSW has updated rehab admissions coordinator on situation. The patient and family are hoping for [name] admission but they understand that this will be heavily influenced by [facility] decision. Family is aware of [name] options available but do not provide decision on [name] as they remain insistent on discharge to [name].  Liz Beach MSW, [place], [place], [phone]

## 2016-11-16 NOTE — NC FL2 (Signed)
[place] LEVEL OF CARE SCREENING TOOL     IDENTIFICATION  Patient Name: [name] Birthdate: Aug 08, 1981 Sex: male Admission Date (Current Location): 10/29/2016  [facility] and [place] Number:  Herbalist and Address:  [facility] [place], [place] [address] [place], [place] [address]      Provider Number: [id number]  Attending Physician Name and Address:  Albertine Patricia, MD  Relative Name and Phone Number:  Keaghan Kovalenko (Mother) [phone]    Current Level of Care: Hospital Recommended Level of Care: [place] Prior Approval Number:    Date Approved/Denied:   PASRR Number: [id number]  Discharge Plan: [name]    Current Diagnoses: Patient Active Problem List   Diagnosis Date Noted  . Infection 11/12/2016  . Alcoholic cirrhosis of liver without ascites ([place])   . Goals of care, counseling/discussion   . Palliative care by specialist   . Pyogenic arthritis of multiple sites ([place])   . Traumatic compartment syndrome of other sites [facility])   . Alcoholic liver failure ([place])   . Traumatic compartment syndrome of lower extremity ([place])   . Infection of lumbar spine ([place]) 11/01/2016  . Delirium tremens ([place])   . Abnormal transaminases   . Alcoholic hepatitis without ascites   . ETOH abuse   . Abscess of right lower extremity   . Cellulitis and abscess of right lower extremity   . Hypernatremia   . Cellulitis of right lower extremity 10/30/2016  . EtOH dependence ([place]) 10/30/2016  . Thrombocytopenia ([place]) 10/30/2016  . Cirrhosis ([place])   . Paraspinal abscess ([place])   . Chronic hepatitis C without hepatic coma ([place])   . Hepatitis   . Epidural abscess 10/29/2016  . Liver failure ([place]) 10/29/2016  . Sepsis ([place]) 10/29/2016  . Hyponatremia 10/29/2016    Orientation RESPIRATION BLADDER Height & Weight     Self, Time, Situation, Place  Normal Continent, Indwelling catheter Weight: 106.2 kg (234 lb 3.2 oz) Height:   5\' 11"  (180.3 cm)  BEHAVIORAL SYMPTOMS/MOOD NEUROLOGICAL BOWEL NUTRITION STATUS   (NONE)  (NONE) Continent Diet (Regular)  AMBULATORY STATUS COMMUNICATION OF NEEDS Skin   Limited Assist Verbally Skin abrasions (Closed back incision- guaze, paper tape, silicone dressing: change daily. Closed right incision- ABD, Compression Wrap, paper tape: change daily )                       Personal Care Assistance Level of Assistance  Feeding, Dressing, Bathing Bathing Assistance: Limited assistance Feeding assistance: Independent Dressing Assistance: Limited assistance     Functional Limitations Info  Sight, Hearing, Speech Sight Info: Adequate Hearing Info: Adequate Speech Info: Adequate    SPECIAL CARE FACTORS FREQUENCY  PT (By licensed PT), OT (By licensed OT)     PT Frequency: 5/week OT Frequency: 5/week            Contractures Contractures Info: Not present    Additional Factors Info  Code Status, Allergies, Psychotropic Code Status Info: FULL Allergies Info: No Known Allergies Psychotropic Info: Ativan          Current Medications (11/16/2016):  This is the current hospital active medication list Current Facility-Administered Medications  Medication Dose Route Frequency Provider Last Rate Last Dose  . 0.9 %  sodium chloride infusion  250 mL Intravenous Continuous Melina Schools, MD      . cefTRIAXone (ROCEPHIN) 2 g in dextrose 5 % 50 mL IVPB  2 g Intravenous Q24H Dawood S Elgergawy,  MD   2 g at 11/15/16 1748  . folic acid (FOLVITE) tablet 1 mg  1 mg Oral Daily Albertine Patricia, MD   1 mg at 11/16/16 1020  . labetalol (NORMODYNE,TRANDATE) injection 10 mg  10 mg Intravenous Q4H PRN Albertine Patricia, MD      . lactated ringers infusion   Intravenous Continuous Melina Schools, MD   Stopped at 11/12/16 1733  . lactulose (CHRONULAC) 10 GM/15ML solution 30 g  30 g Oral BID Albertine Patricia, MD   30 g at 11/16/16 1019  . menthol-cetylpyridinium (CEPACOL) lozenge 3 mg  1  lozenge Oral PRN Melina Schools, MD       Or  . phenol (CHLORASEPTIC) mouth spray 1 spray  1 spray Mouth/Throat PRN Melina Schools, MD      . methocarbamol (ROBAXIN) tablet 500 mg  500 mg Oral Q6H PRN Melina Schools, MD       Or  . methocarbamol (ROBAXIN) 500 mg in dextrose 5 % 50 mL IVPB  500 mg Intravenous Q6H PRN Melina Schools, MD      . morphine 2 MG/ML injection 1-4 mg  1-4 mg Intravenous Q3H PRN Melina Schools, MD   2 mg at 11/16/16 1210  . multivitamin with minerals tablet 1 tablet  1 tablet Oral Daily Albertine Patricia, MD   1 tablet at 11/16/16 1020  . ondansetron (ZOFRAN) injection 4 mg  4 mg Intravenous Q4H PRN Melina Schools, MD      . ondansetron (ZOFRAN) tablet 4 mg  4 mg Oral Q6H PRN Albertine Patricia, MD       Or  . ondansetron (ZOFRAN) injection 4 mg  4 mg Intravenous Q6H PRN Albertine Patricia, MD      . oxyCODONE (Oxy IR/ROXICODONE) immediate release tablet 10 mg  10 mg Oral Q4H PRN Melina Schools, MD   10 mg at 11/16/16 0235  . pantoprazole sodium (PROTONIX) 40 mg/20 mL oral suspension 40 mg  40 mg Oral Daily Albertine Patricia, MD   40 mg at 11/16/16 1019  . protein supplement (PREMIER PROTEIN) liquid  11 oz Oral BID BM Albertine Patricia, MD   11 oz at 11/15/16 0958  . risperiDONE (RISPERDAL) tablet 0.5 mg  0.5 mg Oral Q12H Silver [place] Elgergawy, MD   0.5 mg at 11/16/16 1020  . sodium chloride flush (NS) 0.9 % injection 3 mL  3 mL Intravenous Q12H Melina Schools, MD   3 mL at 11/14/16 2200  . sodium chloride flush (NS) 0.9 % injection 3 mL  3 mL Intravenous PRN Melina Schools, MD      . thiamine (VITAMIN B-1) tablet 100 mg  100 mg Oral Daily Albertine Patricia, MD   100 mg at 11/16/16 1020  . traMADol (ULTRAM) tablet 25 mg  25 mg Oral Q6H PRN Albertine Patricia, MD   25 mg at 11/14/16 0603     Discharge Medications: Please see discharge summary for a list of discharge medications.  Relevant Imaging Results:  Relevant Lab Results:   Additional Information SS [id number]  Patient will have wound vac at [name].  Rigoberto Noel, LCSW

## 2016-11-16 NOTE — Discharge Instructions (Signed)
Management per [name], check CBC, BMP every 72 hours, patient on regular diet, wound VAC change by 1 care nurse every Monday, Wednesday and Friday.

## 2016-11-16 NOTE — H&P (Addendum)
Physical Medicine and Rehabilitation Admission H&P    Chief Complaint  Patient presents with  . Epidural abscess.     HPI:  [name] is a 36 y.o. male with history of Hep C, aETOH abuse with cirrhosis of liver, IV drug abuse--clean for 15 years, chronic back pain, RLE cellulitis, who had recent MRI showing evidence of L4-5 right facet septic arthritis with soft tissue abscess paraspinal musculature 1.3 cm X 1.7 cm C 4.6 cm and  L2/3 and L3/4 disc protrusions.  He was admitted via MD office on 02/1 and started on IV antibiotics. Dr. Tommy Medal expressed concerns regarding right thigh myositis and MRI RLE revealed extensive abscesses involving posterior compartment of thigh along with myofasciiitis and cellulitis and abscess all along the course of sciatic/femoral nerve.  Surgery delayed due to abnormal LFTs, INR 2.1 and thrombocytopenia.  Dr. Fuller Plan consulted for input and recommended prednisone X 30 days for alcoholic hepatitis with jaundice. He developed mental status changes due to delirium tremens requiring intubation and sedation. He was taken to OR on 02/ 04/18 for I and D of right posterior thigh abscess by Dr. Veverly Fells and I and D of L4/5 epidural abscess with Gill decompression R L4/5 by Dr Rolena Infante. steroids discontinued due to infection. Patient self extubated 02/09 and mentation improving with resolution of  psychosis and agitation.  Risperdal being weaned.    He continued to have drainage from his back and was taken back to OR on 02/15 for I and D with placement of VAC by Dr. Rolena Infante. VAC changed at bedside today with no evidence of active drainage. To continue MWF dressing changes with return to OR next week for wound closure.  Xifaximin discontinued on 2/17, LFTs are improving and he is to follow up with Dr. Alice Reichert 3-4 weeks after discharge.  Therapy ongoing and patient showing improvement in mobility but poor safety awareness. [name] recommended for follow up therapy.    Review of  Systems  HENT: Negative for hearing loss and tinnitus.   Eyes: Negative for blurred vision and double vision.  Respiratory: Negative for cough, sputum production and shortness of breath.   Cardiovascular: Negative for chest pain, palpitations and leg swelling.  Gastrointestinal: Negative for abdominal pain, diarrhea, heartburn and nausea.  Genitourinary: Negative for dysuria.  Musculoskeletal: Positive for back pain and myalgias.  Skin: Negative for itching and rash.  Neurological: Positive for weakness. Negative for sensory change and focal weakness.  Psychiatric/Behavioral: Negative for depression. The patient is not nervous/anxious.   All other systems reviewed and are negative.     Past Medical History:  Diagnosis Date  . Hepatitis C     Past Surgical History:  Procedure Laterality Date  . DECOMPRESSIVE LUMBAR LAMINECTOMY LEVEL 1 N/A 11/01/2016   Procedure: DECOMPRESSIVE POSTERIOR LUMBAR LAMINECTOMY LEVEL 4-5;  Surgeon: Melina Schools, MD;  Location: [place];  Service: Orthopedics;  Laterality: N/A;  . HERNIA REPAIR    . I&D EXTREMITY Right 11/01/2016   Procedure: IRRIGATION AND DEBRIDEMENT ABSCESS RIGHT THIGH;  Surgeon: Melina Schools, MD;  Location: [place];  Service: Orthopedics;  Laterality: Right;  . LUMBAR WOUND DEBRIDEMENT N/A 11/12/2016   Procedure: LUMBAR WOUND DEBRIDEMENT;  Surgeon: Melina Schools, MD;  Location: [place];  Service: Orthopedics;  Laterality: N/A;    Family History  Problem Relation Age of Onset  . Diabetes Mother       Social History:  Single-- lives with girlfriend? Works for a [employer]?  He reports that he  quit smoking years ago.  He has never used smokeless tobacco.  Per reports that he does not drink alchol much anymore.  Per  reports that he does not use drugs.    Allergies: No Known Allergies     Medications Prior to Admission  Medication Sig Dispense Refill  . cyclobenzaprine (FLEXERIL) 10 MG tablet Take 1 tablet (10 mg total) by mouth 2  (two) times daily as needed for muscle spasms. 20 tablet 0  . HYDROcodone-acetaminophen (NORCO/VICODIN) 5-325 MG tablet Take 2 tablets by mouth every 4 (four) hours as needed. 10 tablet 0  . naproxen (NAPROSYN) 500 MG tablet Take 1 tablet (500 mg total) by mouth 2 (two) times daily. 30 tablet 0  . promethazine (PHENERGAN) 25 MG tablet Take 25 mg by mouth every 6 (six) hours as needed for nausea or vomiting.    . diclofenac sodium (VOLTAREN) 1 % GEL Apply 4 g topically 4 (four) times daily. (Patient not taking: Reported on 10/29/2016) 100 g 1  . predniSONE (DELTASONE) 20 MG tablet Take 2 tablets (40 mg total) by mouth daily. 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  Requires mod cues for log rolling  Transfers Overall transfer level: Needs assistance Equipment used: None Transfers: Sit to/from Stand Sit to Stand: Min guard Stand pivot transfers: Min guard General transfer comment: min guard assist for safety  Ambulation/Gait Ambulation/Gait assistance: Min guard Ambulation Distance (Feet): 275 Feet Assistive device: None Gait Pattern/deviations: Step-through pattern General Gait Details: Verbal cues initially to slow gait speed. Able to maintain precautions during amb without distraction. Gait velocity: decreased Gait velocity interpretation: Below normal speed for age/gender     ADL: ADL Overall ADL's : Needs assistance/impaired Eating/Feeding: Sitting, Independent Eating/Feeding Details (indicate cue type and reason): Pt set up with lunch at end of session Grooming: Wash/dry hands, Wash/dry face, Oral care, Brushing hair, Min guard, Standing Grooming Details (indicate cue type and reason): Min assist for sitting balance Upper Body Bathing: Minimal assistance, Sitting Lower Body Bathing: Sit to/from stand, Moderate assistance Upper Body Dressing : Sitting, Moderate assistance Upper Body Dressing Details (indicate cue type and reason): mod A to don TLSO  Lower Body Dressing: Minimal assistance, Sit to/from stand Lower Body Dressing Details (indicate cue type and reason): Pt requires min A to don socks  Toilet Transfer: Min guard, Ambulation, Comfort height toilet, BSC, Grab bars, RW Toilet Transfer Details (indicate cue type and reason): Simulated by sit to stand from chair with functional mobility Toileting- Clothing Manipulation and Hygiene: Minimal assistance, Sit to/from stand Toileting - Clothing Manipulation Details (indicate cue type and reason): assist for clothing adjustment  Functional mobility during ADLs: Min guard, Rolling walker General ADL Comments: Girlfriend present   Cognition: Cognition Overall Cognitive Status: Impaired/Different from baseline Orientation Level: Oriented X4 Cognition Arousal/Alertness: Awake/alert Behavior During Therapy: WFL for tasks assessed/performed Overall Cognitive Status: Impaired/Different from baseline Area of Impairment: Safety/judgement Orientation Level: Situation Current Attention Level: Selective Memory: Decreased recall of precautions  Following Commands: Follows one step commands with increased time, Follows one step commands inconsistently Safety/Judgement: Decreased awareness of safety, Decreased awareness of deficits Awareness: Intellectual Problem Solving: Slow processing, Difficulty sequencing,  Requires verbal cues, Requires tactile cues General Comments: min verbal cues for safety    Blood pressure 123/68, pulse 95, temperature 98.5 F (36.9 C), temperature source Oral, resp. rate 15, height '5\' 11"'  (1.803 m), weight 106.2 kg (234 lb 3.2 oz), SpO2 98 %. Physical Exam  Nursing note and vitals reviewed. Constitutional: He is oriented to person, place, and time. He appears well-developed and well-nourished.  HENT:  Head: Normocephalic and atraumatic.  Mouth/Throat: Oropharynx is clear and moist.  Eyes: Conjunctivae and EOM are normal. Pupils are equal, round, and reactive to light.  Neck: Normal range of motion. Neck supple.  Cardiovascular: Normal rate and regular rhythm.   Respiratory: Effort normal and breath sounds normal. No stridor. No respiratory distress. He has no wheezes.  GI: Soft. Bowel sounds are normal. He exhibits no distension. There is no tenderness.  Musculoskeletal: He exhibits no edema or tenderness.  Right posterior thigh incision with sutures in place with min serous drainage on dressing.   Neurological: He is alert and oriented to person, place, and time.  Speech clear.  Tangential at times.  Able to follow commands without difficulty.  Motor: 5/5 throughout  Skin: Skin is warm and dry.  Back wound with bloody drainage via VAC.   Psychiatric: He has a normal mood and affect. His behavior is normal.    Results for orders placed or performed during the hospital encounter of 10/29/16 (from the past 48 hour(s))  CBC     Status: Abnormal   Collection Time: 11/15/16  5:06 AM  Result Value Ref Range   WBC 8.9 4.0 - 10.5 K/uL   RBC 2.77 (L) 4.22 - 5.81 MIL/uL   Hemoglobin 9.3 (L) 13.0 - 17.0 g/dL   HCT 29.2 (L) 39.0 - 52.0 %   MCV 105.4 (H) 78.0 - 100.0 fL   MCH 33.6 26.0 - 34.0 pg   MCHC 31.8 30.0 - 36.0 g/dL   RDW 16.1 (H) 11.5 - 15.5 %   Platelets 88 (L) 150 - 400 K/uL    Comment: CONSISTENT WITH PREVIOUS RESULT  Basic metabolic panel     Status:  Abnormal   Collection Time: 11/15/16  5:06 AM  Result Value Ref Range   Sodium 137 135 - 145 mmol/L   Potassium 3.6 3.5 - 5.1 mmol/L   Chloride 104 101 - 111 mmol/L   CO2 27 22 - 32 mmol/L   Glucose, Bld 80 65 - 99 mg/dL   BUN 9 6 - 20 mg/dL   Creatinine, Ser 0.63 0.61 - 1.24 mg/dL   Calcium 8.0 (L) 8.9 - 10.3 mg/dL   GFR calc non Af Amer >60 >60 mL/min   GFR calc Af Amer >60 >60 mL/min    Comment: (NOTE) The eGFR has been calculated using the CKD EPI equation. This calculation has not been validated in all clinical situations. eGFR's persistently <60 mL/min signify possible Chronic Kidney Disease.    Anion gap 6 5 - 15  Ammonia     Status: None   Collection Time: 11/15/16  6:52 AM  Result Value Ref Range   Ammonia 24 9 - 35 umol/L  C-reactive protein     Status: None   Collection Time: 11/16/16 12:16 PM  Result Value Ref Range   CRP <0.8 <1.0 mg/dL   No  results found.   Medical Problem List and Plan: 1.  Requires some assistance for mobility and self-care secondary to epidural abscess. 2.  DVT Prophylaxis/Anticoagulation: Mechanical: Sequential compression devices, below knee Bilateral lower extremities 3. Pain Management: discontinue morphine. Continue oxycodone prn  4. Mood: LCSW to follow for evaluation and support.  5. Neuropsych: This patient is not fully capable of making decisions on his own behalf. 6. Skin/Wound Care:  Monitor right thigh wound --on protein supplement to help promote wound healing. 7. Fluids/Electrolytes/Nutrition: Check I/O. Offer supplements between meals if intake poor. Check lytes in am. 8. 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 Plans for surgery next week? 9. Right thigh abscess/myositis: Antibiotics narrowed to Ceftriaxone 2 grams daily for 6 weeks if in supervised setting due to concerns of IV misuse ( or change to Augmentin) --on Day # 19/42.  10. Acute liver failure with hepatitis  with jaundice: Jaundice resolved with resolving LFTs.  Off Xifaximin since 2/17.  Continue lactulose bid.   11. Thrombocytopenia with coagulopathy:  Serial check of CBC--will monitor for signs of bleeding.  12. Delirium Tremens/alcohol abuse: On Risperdal bid.  13. Abnormal LFTs: Resolving.    Post Admission Physician Evaluation: 1. Preadmission assessment reviewed and changes made below. 2. Functional deficits secondary  to epidural abscess.. 3. Patient is admitted to receive collaborative, interdisciplinary care between the physiatrist, rehab nursing staff, and therapy team. 4. Patient's level of medical complexity and substantial therapy needs in context of that medical necessity cannot be provided at a lesser intensity of care such as a [name]. 5. Patient has experienced substantial functional loss from his/her baseline which was documented above under the "Functional History" and "Functional Status" headings.  Judging by the patient's diagnosis, physical exam, and functional history, the patient has potential for functional progress which will result in measurable gains while on inpatient rehab.  These gains will be of substantial and practical use upon discharge  in facilitating mobility and self-care at the household level. 6. Physiatrist will provide 24 hour management of medical needs as well as oversight of the therapy plan/treatment and provide guidance as appropriate regarding the interaction of the two. 7. The Preadmission Screening has been reviewed and patient status is unchanged unless otherwise stated above. 8. 24 hour rehab nursing will assist with safety, skin/wound care, pain management and patient education  and help integrate therapy concepts, techniques,education, etc. 9. PT will assess and treat for/with: Lower extremity strength, range of motion, stamina, balance, functional mobility, safety, adaptive techniques and equipment, woundcare, coping skills, pain control, education.    Goals are: Mod I. 10. OT will assess and treat for/with: ADL's, functional mobility, safety, upper extremity strength, adaptive techniques and equipment, wound mgt, ego support, and community reintegration.   Goals are: Mod I. Therapy may not proceed with showering this patient. 11. Case Management and Social Worker will assess and treat for psychological issues and discharge planning. 12. Team conference will be held weekly to assess progress toward goals and to determine barriers to discharge. 13. Patient will receive at least 3 hours of therapy per day at least 5 days per week. 14. ELOS: 3-5 days.       15. Prognosis:  good  Delice Lesch, MD, [address] [place] 11/16/2016

## 2016-11-16 NOTE — Progress Notes (Signed)
Patient verbalized understanding of discharge from 3W and admission to [name], room [phone]. Patient's belongings bagged up. Patient's family member at bedside, and patient stated that he would update his mother about transfer to [name]. Report called to nurse on 4W.

## 2016-11-16 NOTE — Progress Notes (Signed)
Pt arrived to unit, girlfriend at bedside.  Pt alert and oriented x 4, able to make needs known, pt has TLSO brace at bedside to use when OOB or sitting up.  Wound vac/dsg is clean, dry, intact and draining at rate of 125 red fluid.  Dressing to right posterior thigh is clean, dry and intact.  Pt resting comfortably with bed alarm on and functioning. Will continue plan of care.

## 2016-11-16 NOTE — Progress Notes (Signed)
Physical Therapy Treatment Patient Details Name: [name] MRN: [id number] DOB: 11-19-1980 Today's Date: 11/16/2016    History of Present Illness 36 yo male developed swelling in Rt leg and foot, and back pain.  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  He was seen by orthopedics, and started on IV abx. 2/04 surgery for decompressive posterior lumbar laminectomy L4-5 and I&D of right thigh abscess  He has hx of Hepatitis C and ETOH.  He was noted to have jaundice and was seen by GI.  He was started on prednisone for alcoholic hepatitis.  He was also seen by ID.  He developed progressive altered mental status due to delirium tremens.  He was transferred to ICU and intubated 2/3; self extubated 2/7.   Pt s/p facet I & D with insertion of wound vac (2/15)    PT Comments    Pt progressing towards goal. Able to amb in hallway with improved stability and no AD; still requiring multiple cues to maintain back precautions and modA for brace application in sitting. Would benefit from stair training (14 stairs at home) and bed mobility next treatment. Will continue to follow.   Follow Up Recommendations  [name]     Equipment Recommendations  None recommended by PT    Recommendations for Other Services Rehab consult     Precautions / Restrictions Precautions Precautions: Back;Other (comment) Required Braces or Orthoses: Spinal Brace Spinal Brace: Applied in sitting position;Thoracolumbosacral orthotic Restrictions Weight Bearing Restrictions: No Other Position/Activity Restrictions: Requires assist for brace application    Mobility  Bed Mobility Overal bed mobility: Needs Assistance Bed Mobility: Rolling;Sidelying to Sit Rolling: Supervision Sidelying to sit: Min guard;HOB elevated       General bed mobility comments: Cues for log rolling and min guard for precautions with sitting.   Transfers Overall transfer  level: Needs assistance Equipment used: None Transfers: Sit to/from Stand Sit to Stand: Min guard         General transfer comment: Min guard with verbal cues for precautions. Pt able to stand with supervision and no AD.   Ambulation/Gait Ambulation/Gait assistance: Min guard Ambulation Distance (Feet): 275 Feet Assistive device: None Gait Pattern/deviations: Step-through pattern   Gait velocity interpretation: Below normal speed for age/gender General Gait Details: Verbal cues initially to slow gait speed. Able to maintain precautions during amb without distraction.   Stairs            Wheelchair Mobility    Modified Rankin (Stroke Patients Only)       Balance Overall balance assessment: Needs assistance Sitting-balance support: Feet supported;No upper extremity supported Sitting balance-Leahy Scale: Good Sitting balance - Comments: ModA to don TLSO in sitting.    Standing balance support: During functional activity;No upper extremity supported Standing balance-Leahy Scale: Fair                      Cognition Arousal/Alertness: Awake/alert Behavior During Therapy: WFL for tasks assessed/performed Overall Cognitive Status: Impaired/Different from baseline Area of Impairment: Safety/judgement         Safety/Judgement: Decreased awareness of deficits;Decreased awareness of safety     General Comments: Pt able to recall precautions, but continues to move quickly and require verbal cues to prevent forward bending and twisting.    Exercises      General Comments        Pertinent Vitals/Pain Pain Assessment: 0-10 Pain Score: 10-Worst pain ever Pain  Location: back Pain Descriptors / Indicators: Sore Pain Intervention(s): Monitored during session;Repositioned    Home Living                      Prior Function            PT Goals (current goals can now be found in the care plan section) Progress towards PT goals: Progressing  toward goals    Frequency    Min 4X/week      PT Plan Current plan remains appropriate    Co-evaluation             End of Session Equipment Utilized During Treatment: Gait belt;Back brace Activity Tolerance: Patient tolerated treatment well Patient left: in chair;with call bell/phone within reach     Time: 0917-0937 PT Time Calculation (min) (ACUTE ONLY): 20 min  Charges:  $Gait Training: 8-22 mins                    G Codes:     Enis Gash, SPT [phone]  Mabeline Caras 11/16/2016, 1:10 PM

## 2016-11-16 NOTE — Progress Notes (Signed)
    Subjective: Procedure(s) (LRB): LUMBAR WOUND DEBRIDEMENT (N/A) 4 Days Post-Op  Patient reports pain as 3 on 0-10 scale.  Reports decreased leg pain reports incisional back pain   Positive void Positive bowel movement Positive flatus Negative chest pain or shortness of breath  Objective: Vital signs in last 24 hours: Temp:  [98.2 F (36.8 C)-98.5 F (36.9 C)] 98.5 F (36.9 C) (02/19 0450) Pulse Rate:  [84-101] 87 (02/19 0450) Resp:  [15-19] 15 (02/19 0450) BP: (121-135)/(59-75) 129/63 (02/19 0450) SpO2:  [98 %-100 %] 98 % (02/19 0450) Weight:  [106.2 kg (234 lb 3.2 oz)] 106.2 kg (234 lb 3.2 oz) (02/19 0450)  Intake/Output from previous day: 02/18 0701 - 02/19 0700 In: 240 [P.O.:240] Out: 2460 [Urine:2400; Drains:60]  Labs:  Recent Labs  11/15/16 0506  WBC 8.9  RBC 2.77*  HCT 29.2*  PLT 88*    Recent Labs  11/15/16 0506  NA 137  K 3.6  CL 104  CO2 27  BUN 9  CREATININE 0.63  GLUCOSE 80  CALCIUM 8.0*   No results for input(s): LABPT, INR in the last 72 hours.  Physical Exam: Neurologically intact ABD soft Intact pulses distally Compartment soft incision: C/D   no active drainage  Assessment/Plan: Patient stable  xrays n/a  Continue mobilization with physical therapy Continue care  Changed vac at bedside - patient tolerated well. Plan on M/W/F vac dressing change with wound nurse Re-eval wound next week with plan on closure next week Wednesday Monitor CRP  Melina Schools, [place] [phone]

## 2016-11-16 NOTE — Progress Notes (Signed)
Rehab admissions - I reopened the case with [employer].  After much discussion with insurance case manager and unit case Metallurgist, we have approval for acute inpatient rehab admission for today.  I called attending MD and have clearance for acute inpatient rehab admission for today.  Bed available and will admit to acute inpatient rehab today.  Call me for questions.  [id number]

## 2016-11-16 NOTE — Progress Notes (Signed)
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 Care transferred over to [facility] on 11/06/16. Mental status significantly improved, back to baseline, lumbar and upper thigh wounds are improving.   Assessment & Plan:   Principal Problem:   Epidural abscess Active Problems:   Liver failure (HCC)   Hyponatremia   Cellulitis of right lower extremity   EtOH dependence (HCC)   Thrombocytopenia (HCC)   Cirrhosis (HCC)   Paraspinal abscess (HCC)   Chronic hepatitis C without hepatic coma (HCC)   Hepatitis   Abnormal transaminases   Alcoholic hepatitis without ascites   ETOH abuse   Abscess of right lower extremity   Cellulitis and abscess of right lower extremity   Hypernatremia   Delirium tremens (HCC)   Infection of lumbar spine (HCC)   Pyogenic arthritis of multiple sites [facility])   Traumatic compartment syndrome of other sites [facility])   Alcoholic liver failure ([place])   Traumatic compartment syndrome of lower extremity ([place])   Alcoholic cirrhosis of liver without ascites ([place])   Goals of care, counseling/discussion   Palliative care by specialist   Infection   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 empirically  on IV vancomycin and Zosyn was changed to cefepime, ID recommending total 6 weeks of IV Rocephin - PICC line inserted,  history of remote IV drug abuse, more than 15 years ago, discussed with ID, patient and his mother, patient can be discharged with PICC line for IV antibiotics,  mother will provide care at home with 24/7 supervision from her and multiple family members. - Status post Decompressive posterior lumbar laminectomy level IV-V by orthopedics on 11/01/16, again to OR on 12/15 for lumbar wound debridement, and wound VAC insertion, discussed with Dr. Rolena Infante , plan 41 VAC dressing change today (bedside versus OR )pl - Post surgery orthopedics recommended mobilization with PT, upper therapy and TLSO brace. PT and OT evaluated and recommended [name].  Right posterior deep thigh abscess/pyomyositis:  - Status post I&D by orthopedics on 11/01/16. Followed by ortho. Looks clean, no discharge or oozing, case with Dr. Francee Piccolo, he will evaluate wound today  Acute hepatic failure/Jaundice/hepatitis C/fatty liver:  - History of hepatitis C and fatty liver. History of severe alcoholic hepatitis [id number]. Never followed through with planned treatment with [facility] after failing some unspecified antiviral in 2016. MELD=24 & discriminant function 66 consistent with recurrent alcoholic hepatitis. GI was consulted. Patient was started on prednisone 40 mg daily 30 days which was switched to IV Solu-Medrol while he was intubated, for alcoholic hepatitis. Steroids were then stopped on 11/02/16 due to ongoing infection. As per GI, alcoholic hepatitis typically takes 1-3 months to resolve and he is at increased risk for hepatic decompensation, morbidity and mortality from this and this risk will not likely change for 1-2 months. - Avoid alcohol completely and acetaminophen.  - He was also placed on lactulose and rifaximin. LFTs improving. - He has to  follow up with his primary GI doctor (Dr. Alice Reichert) in 3-4 weeks from discharge.  Alcohol abuse and likely dependence:  - On 10/31/16, patient developed progressive altered mental status due to delirium  tremens and he was transferred to ICU. He was started on Precedex drip and multivitamins. - Delirium resolved, no further evidence of withdrawals, mentation back to baseline  Coagulopathy - Secondary to acute liver failure. No bleeding reported. Required vitamin K and FFP before surgery  Thrombocytopenia - Secondary to liver disease. Treating  Hyponatremia - Resolved.  Acute hypoxic respiratory failure secondary to unable to protect airways due to DTs:  - Transfer to ICU. Intubated 11/01/16. CCM managed. Patient self extubated on 11/04/16. Maintaining airway. - Currently on room air.  Acute encephalopathy secondary to delirium tremens/hepatic encephalopathy:  - Remains on rifaximin and lactulose. [facility] assisted with management. DTs improved but patient remained confused. His mental status changes were felt to be multifactorial secondary to medications in addition to hepatic encephalopathy and ongoing infections. Sedative medications were minimized. Ammonia has normalized.  - Remains on risperidone 1 MG twice a day will start tapering today. - Mental status back to baseline  Hypoglycemia: - Resolved. Speech therapy evaluated and recommended a regular consistency diet.  Hypokalemia and hypomagnesemia - Replace and follow as needed.  Anemia - Stable.  Goals of care:  As per chart review, patient's brother indicated that patient's significant other is not his wife and patient's mother should be in charge of making healthcare decisions, CODE STATUS and short-term versus long-term goals care. PMT's onboard to further evaluate case and assist.   DVT prophylaxis: SCDs(unable to do any chemical prophylaxis given coagulopathy and thrombocytopenia) Code Status: Full Family Communication: None at bedside Disposition Plan: Pending clinical course and improvement, likely will need this and if placement in 1-2 days.   Consultants:   Orthopedics  [facility]  Infectious  disease  CCM  Rehabilitation M.D.  Procedures:   Intubated for mechanical ventilation 11/01/16  Central line 11/01/16-Discontinued   I&D of right posterior deep deep thigh abscess by orthopedics on 11/01/16  Decompressive posterior lumbar laminectomy level IV-V by orthopedics on 11/01/16  NG tube feeding-discontinued.  Arterial line-discontinued  Foley catheter  Antimicrobials:   IV Rocephin  Rifaximin     Subjective: Seen this morning.no acute events overnight, reports pain in right lower thigh is controlled , no nausea, no vomiting, no chest pain or shortness of breath  Objective:  Vitals:   11/15/16 0500 11/15/16 1539 11/15/16 2155 11/16/16 0450  BP: 115/64 (!) 121/59 135/75 129/63  Pulse: 95 84 (!) 101 87  Resp: 16  19 15   Temp: 98.5 F (36.9 C) 98.2 F (36.8 C) 98.5 F (36.9 C) 98.5 F (36.9 C)  TempSrc: Oral Axillary Oral Oral  SpO2: 98% 100% 98% 98%  Weight:    106.2 kg (234 lb 3.2 oz)  Height:        Intake/Output Summary (Last 24 hours) at 11/16/16 1120 Last data filed at 11/16/16 0829  Gross per 24 hour  Intake              240 ml  Output             2660 ml  Net            -2420 ml   Filed Weights   11/12/16 0539 11/13/16 0522 11/16/16 0450  Weight: 110 kg (242 lb 8.1 oz) 106.7 kg (235 lb 4.8 oz) 106.2 kg (234 lb 3.2 oz)  Examination:  General exam: Pleasant young male Lying in bed eating breakfast Respiratory system: Clear to auscultation. Respiratory effort normal. No wheezing Cardiovascular system: S1 & S2 heard, RRR. No JVD, murmurs, rubs, gallops or clicks. No pedal edema.  Gastrointestinal system: Abdomen is nondistended, soft and nontender. Normal bowel sounds heard. Extremities: Symmetric 5 x 5 power. No edema Skin: No rashes, lesions or ulcers. Lumbar spine surgical site dressing With wound VAC. Right posterior thigh with no discharge on dressing today.Marland Kitchen Psychiatry: Awake, alert 3 , appropriate    Data Reviewed: I have personally  reviewed following labs and imaging studies  CBC:  Recent Labs Lab 11/09/16 1527 11/11/16 0537 11/12/16 0800 11/15/16 0506  WBC 11.4* 10.0 7.1 8.9  HGB 9.0* 9.2* 9.3* 9.3*  HCT 28.3* 29.2* 28.8* 29.2*  MCV 105.6* 106.2* 105.9* 105.4*  PLT 132* 108* 85* 88*   Basic Metabolic Panel:  Recent Labs Lab 11/09/16 1527 11/11/16 0537 11/12/16 0800 11/15/16 0506  NA 138 136 137 137  K 3.6 3.5 3.7 3.6  CL 109 106 108 104  CO2 24 23 23 27   GLUCOSE 104* 86 87 80  BUN <5* 5* 5* 9  CREATININE 0.68 0.69 0.59* 0.63  CALCIUM 8.0* 8.0* 8.1* 8.0*  MG 1.7  --   --   --    GFR: Estimated Creatinine Clearance: 159.9 mL/min (by C-G formula based on SCr of 0.63 mg/dL). Liver Function Tests:  Recent Labs Lab 11/09/16 1527 11/12/16 0800  AST 115* 100*  ALT 51 50  ALKPHOS 92 84  BILITOT 6.2* 5.5*  PROT 6.1* 6.0*  ALBUMIN 1.8* 1.8*   No results for input(s): LIPASE, AMYLASE in the last 168 hours.  Recent Labs Lab 11/15/16 0652  AMMONIA 24   Coagulation Profile:  Recent Labs Lab 11/09/16 1527 11/10/16 0439 11/11/16 0537 11/12/16 0500  INR 1.93 1.95 1.91 2.00   Cardiac Enzymes: No results for input(s): CKTOTAL, CKMB, CKMBINDEX, TROPONINI in the last 168 hours. BNP (last 3 results) No results for input(s): PROBNP in the last 8760 hours. HbA1C: No results for input(s): HGBA1C in the last 72 hours. CBG:  Recent Labs Lab 11/09/16 1125 11/12/16 1316  GLUCAP 105* 88   Lipid Profile: No results for input(s): CHOL, HDL, LDLCALC, TRIG, CHOLHDL, LDLDIRECT in the last 72 hours. Thyroid Function Tests: No results for input(s): TSH, T4TOTAL, FREET4, T3FREE, THYROIDAB in the last 72 hours. Anemia Panel: No results for input(s): VITAMINB12, FOLATE, FERRITIN, TIBC, IRON, RETICCTPCT in the last 72 hours.  Sepsis Labs:  Recent Labs Lab 11/09/16 1527 11/11/16 0537 11/12/16 0800 11/15/16 0506  WBC 11.4* 10.0 7.1 8.9    No results found for this or any previous visit  (from the past 240 hour(s)).       Radiology Studies: No results found.      Scheduled Meds: . cefTRIAXone (ROCEPHIN)  IV  2 g Intravenous Q24H  . folic acid  1 mg Oral Daily  . lactulose  30 g Oral BID  . multivitamin with minerals  1 tablet Oral Daily  . pantoprazole sodium  40 mg Oral Daily  . protein supplement shake  11 oz Oral BID BM  . risperiDONE  0.5 mg Oral Q12H  . sodium chloride flush  3 mL Intravenous Q12H  . thiamine  100 mg Oral Daily   Continuous Infusions: . sodium chloride    . lactated ringers Stopped (11/12/16 1733)     LOS: 18 days      Kanyah Matsushima, MD  [facility] Pager [phone]  If 7PM-7AM, please contact night-coverage [email or web address] Password [email or web address] 11/16/2016, 11:20 AM

## 2016-11-16 NOTE — Progress Notes (Signed)
Occupational Therapy Treatment Patient Details Name: Tequan Seavers MRN: [id number] DOB: 01-26-1981 Today's Date: 11/16/2016    History of present illness 36 yo male developed swelling in Rt leg and foot, and back pain.  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  He was seen by orthopedics, and started on IV abx. 2/04 surgery for decompressive posterior lumbar laminectomy L4-5 and I&D of right thigh abscess  He has hx of Hepatitis C and ETOH.  He was noted to have jaundice and was seen by GI.  He was started on prednisone for alcoholic hepatitis.  He was also seen by ID.  He developed progressive altered mental status due to delirium tremens.  He was transferred to ICU and intubated 2/3; self extubated 2/7.   Pt s/p facet I & D with insertion of wound vac (2/15)   OT comments  Pt progressing with OT.  He requires min - mod A for ADLs, and min cues for back precautions.  Continue to feel [name] will allow him to maximize independence, reduce risk of falls, and readmission before returning home.    Follow Up Recommendations  [name];Supervision/Assistance - 24 hour    Equipment Recommendations  3 in 1 bedside commode;Tub/shower bench    Recommendations for Other Services      Precautions / Restrictions Precautions Precautions: Back Precaution Booklet Issued: No Precaution Comments: Pt able to state 3/3 back precautions.  He requires min cues to adhere to them  Required Braces or Orthoses: Spinal Brace Spinal Brace: Applied in sitting position;Thoracolumbosacral orthotic       Mobility Bed Mobility Overal bed mobility: Needs Assistance Bed Mobility: Rolling;Sidelying to Sit Rolling: Supervision Sidelying to sit: Supervision       General bed mobility comments: Pt attempted to move supine to sit.  Requires mod cues for log rolling   Transfers Overall transfer level: Needs assistance Equipment used: None Transfers: Sit  to/from Stand Sit to Stand: Min guard Stand pivot transfers: Min guard       General transfer comment: min guard assist for safety     Balance Overall balance assessment: Needs assistance Sitting-balance support: Feet supported;No upper extremity supported Sitting balance-Leahy Scale: Good     Standing balance support: During functional activity;No upper extremity supported Standing balance-Leahy Scale: Fair                     ADL Overall ADL's : Needs assistance/impaired Eating/Feeding: Sitting;Independent   Grooming: Wash/dry hands;Wash/dry face;Oral care;Brushing hair;Min guard;Standing   Upper Body Bathing: Minimal assistance;Sitting   Lower Body Bathing: Sit to/from stand;Moderate assistance   Upper Body Dressing : Sitting;Moderate assistance Upper Body Dressing Details (indicate cue type and reason): mod A to don TLSO  Lower Body Dressing: Minimal assistance;Sit to/from stand Lower Body Dressing Details (indicate cue type and reason): Pt requires min A to don socks  Toilet Transfer: Min guard;Ambulation;Comfort height toilet;BSC;Grab bars;RW   Toileting- Clothing Manipulation and Hygiene: Minimal assistance;Sit to/from stand Toileting - Clothing Manipulation Details (indicate cue type and reason): assist for clothing adjustment      Functional mobility during ADLs: Min guard;Rolling walker General ADL Comments: Girlfriend present       Vision                     Perception     Praxis      Cognition   Behavior During Therapy: [facility] for tasks assessed/performed Overall  Cognitive Status: Impaired/Different from baseline Area of Impairment: Safety/judgement          Safety/Judgement: Decreased awareness of safety;Decreased awareness of deficits     General Comments: min verbal cues for safety       Exercises     Shoulder Instructions       General Comments      Pertinent Vitals/ Pain       Pain Assessment: 0-10 Pain Score: 7   Pain Location: back Pain Descriptors / Indicators: Sore Pain Intervention(s): Monitored during session;Repositioned  Home Living                                          Prior Functioning/Environment              Frequency  Min 2X/week        Progress Toward Goals  OT Goals(current goals can now be found in the care plan section)  Progress towards OT goals: Progressing toward goals     Plan Discharge plan remains appropriate    Co-evaluation                 End of Session Equipment Utilized During Treatment: Back brace;Rolling walker  OT Visit Diagnosis: Muscle weakness (generalized) (M62.81)   Activity Tolerance Patient tolerated treatment well   Patient Left in chair;with call bell/phone within reach;with family/visitor present   Nurse Communication Mobility status        Time: [id number] OT Time Calculation (min): 26 min  Charges: OT General Charges $OT Visit: 1 Procedure OT Treatments $Self Care/Home Management : 8-22 mins $Therapeutic Activity: 8-22 mins  [facility], OTR/L [id number]    Lucille Passy M 11/16/2016, 3:58 PM

## 2016-11-16 NOTE — Clinical Social Work Note (Signed)
Information to apply for disability for the patient online was provided to the patient and patient's girlfriend at bedside.   Liz Beach MSW, [place], [place], [phone]

## 2016-11-17 ENCOUNTER — Encounter (HOSPITAL_COMMUNITY): Payer: Self-pay

## 2016-11-17 ENCOUNTER — Inpatient Hospital Stay (HOSPITAL_COMMUNITY): Payer: BLUE CROSS/BLUE SHIELD | Admitting: Speech Pathology

## 2016-11-17 ENCOUNTER — Inpatient Hospital Stay (HOSPITAL_COMMUNITY): Payer: BLUE CROSS/BLUE SHIELD

## 2016-11-17 ENCOUNTER — Inpatient Hospital Stay (HOSPITAL_COMMUNITY): Payer: BLUE CROSS/BLUE SHIELD | Admitting: Occupational Therapy

## 2016-11-17 DIAGNOSIS — G061 Intraspinal abscess and granuloma: Secondary | ICD-10-CM

## 2016-11-17 DIAGNOSIS — R5381 Other malaise: Principal | ICD-10-CM

## 2016-11-17 DIAGNOSIS — G8311 Monoplegia of lower limb affecting right dominant side: Secondary | ICD-10-CM

## 2016-11-17 LAB — CBC WITH DIFFERENTIAL/PLATELET
BASOS ABS: 0.3 10*3/uL — AB (ref 0.0–0.1)
BASOS PCT: 4 %
Eosinophils Absolute: 0.3 10*3/uL (ref 0.0–0.7)
Eosinophils Relative: 4 %
HEMATOCRIT: 29.4 % — AB (ref 39.0–52.0)
HEMOGLOBIN: 9.5 g/dL — AB (ref 13.0–17.0)
LYMPHS PCT: 39 %
Lymphs Abs: 2.6 10*3/uL (ref 0.7–4.0)
MCH: 33.8 pg (ref 26.0–34.0)
MCHC: 32.3 g/dL (ref 30.0–36.0)
MCV: 104.6 fL — ABNORMAL HIGH (ref 78.0–100.0)
Monocytes Absolute: 0.8 10*3/uL (ref 0.1–1.0)
Monocytes Relative: 12 %
NEUTROS ABS: 2.7 10*3/uL (ref 1.7–7.7)
Neutrophils Relative %: 41 %
Platelets: 75 10*3/uL — ABNORMAL LOW (ref 150–400)
RBC: 2.81 MIL/uL — ABNORMAL LOW (ref 4.22–5.81)
RDW: 15.5 % (ref 11.5–15.5)
WBC: 6.7 10*3/uL (ref 4.0–10.5)

## 2016-11-17 LAB — COMPREHENSIVE METABOLIC PANEL
ALBUMIN: 1.8 g/dL — AB (ref 3.5–5.0)
ALK PHOS: 73 U/L (ref 38–126)
ALT: 39 U/L (ref 17–63)
ANION GAP: 4 — AB (ref 5–15)
AST: 69 U/L — ABNORMAL HIGH (ref 15–41)
BILIRUBIN TOTAL: 3.9 mg/dL — AB (ref 0.3–1.2)
BUN: 5 mg/dL — AB (ref 6–20)
CALCIUM: 8.2 mg/dL — AB (ref 8.9–10.3)
CO2: 27 mmol/L (ref 22–32)
Chloride: 106 mmol/L (ref 101–111)
Creatinine, Ser: 0.57 mg/dL — ABNORMAL LOW (ref 0.61–1.24)
GFR calc Af Amer: >60 mL/min (ref >60)
GFR calc non Af Amer: >60 mL/min (ref >60)
GLUCOSE: 94 mg/dL (ref 65–99)
Potassium: 3.7 mmol/L (ref 3.5–5.1)
Sodium: 137 mmol/L (ref 135–145)
TOTAL PROTEIN: 5.5 g/dL — AB (ref 6.5–8.1)

## 2016-11-17 MED ORDER — OXYCODONE HCL 5 MG PO TABS
5.0000 mg | ORAL_TABLET | Freq: Four times a day (QID) | ORAL | Status: DC | PRN
  Administered 2016-11-17 – 2016-11-19 (×6): 5 mg via ORAL
  Filled 2016-11-17 (×6): qty 1

## 2016-11-17 NOTE — Evaluation (Signed)
Speech Language Pathology Assessment and Plan  Patient Details  Name: [name] MRN: [id number] Date of Birth: 26-Feb-1981  SLP Diagnosis: Other (comment) (None) None Rehab Potential:  (Defer to OT/PT)Defer to OT/PT ELOS:   Defer to OT/PT  Today's Date: 11/17/2016 SLP Individual Time: 1400-1430 SLP Individual Time Calculation (min): 30 min and Today's Date: 11/17/2016 SLP Missed Time: 30 Minutes Missed Time Reason: Other (Comment) (eval with no SLP needs )   Problem List:  Patient Active Problem List   Diagnosis Date Noted  . 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 Abnormality of gait   . Post-operative pain   . Infective myositis of right thigh   . Coagulopathy ([place])   . DTs (delirium tremens) ([place])   . Transaminitis   . Infection 11/12/2016  . Alcoholic cirrhosis of liver without ascites ([place])   . Goals of care, counseling/discussion   . Palliative care by specialist   . Pyogenic arthritis of multiple sites ([place])   . Traumatic compartment syndrome of other sites [facility])   . Alcoholic liver failure ([place])   . Traumatic compartment syndrome of lower extremity ([place])   . Infection of lumbar spine ([place]) 11/01/2016  . Delirium tremens ([place])   . Abnormal transaminases   . Alcoholic hepatitis without ascites   . ETOH abuse   . Abscess of right lower extremity   . Cellulitis and abscess of right lower extremity   . Hypernatremia   . Cellulitis of right lower extremity 10/30/2016  . EtOH dependence ([place]) 10/30/2016  . Thrombocytopenia ([place]) 10/30/2016  . Cirrhosis ([place])   . Paraspinal abscess ([place])   . Chronic hepatitis C without hepatic coma ([place])   . Hepatitis   . Epidural abscess 10/29/2016  . Liver failure ([place]) 10/29/2016  . Sepsis ([place]) 10/29/2016  . Hyponatremia 10/29/2016   Past Medical History:  Past Medical History:  Diagnosis Date  . Hepatitis C    Past Surgical History:  Past Surgical History:  Procedure Laterality Date  . DECOMPRESSIVE LUMBAR  LAMINECTOMY LEVEL 1 N/A 11/01/2016   Procedure: DECOMPRESSIVE POSTERIOR LUMBAR LAMINECTOMY LEVEL 4-5;  Surgeon: Melina Schools, MD;  Location: [facility];  Service: Orthopedics;  Laterality: N/A;  . HERNIA REPAIR    . I&D EXTREMITY Right 11/01/2016   Procedure: IRRIGATION AND DEBRIDEMENT ABSCESS RIGHT THIGH;  Surgeon: Melina Schools, MD;  Location: [facility];  Service: Orthopedics;  Laterality: Right;  . LUMBAR WOUND DEBRIDEMENT N/A 11/12/2016   Procedure: LUMBAR WOUND DEBRIDEMENT;  Surgeon: Melina Schools, MD;  Location: [facility];  Service: Orthopedics;  Laterality: N/A;    Assessment / Plan / Recommendation Clinical Impression [name] a 36 y.o.malewith history of Hep C, aETOH abuse with cirrhosis of liver, IV drug abuse--clean for 15 years, chronic back pain, RLE cellulitis, who had recent MRI showing evidence of L4-5 right facet septic arthritis with soft tissue abscess paraspinal musculature 1.3 cm X 1.7 cm C 4.6 cm and L2/3 and L3/4 disc protrusions. He was admitted via MD office on 02/1 and started on IV antibiotics. Dr. Tommy Medal expressed concerns regarding right thigh myositis and MRI RLE revealed extensive abscesses involving posterior compartment of thigh along with myofasciiitis and cellulitis and abscess all along the course of sciatic/femoral nerve. Surgery delayed due to abnormal LFTs, INR 2.1 and thrombocytopenia. Dr. Fuller Plan consulted for input and recommended prednisone X 30 days for alcoholic hepatitis with jaundice. He developed mental status changes due to delirium requiring intubation and sedation. He was taken to OR on 02/ 04/18  for I and D of right posterior thigh abscess by Dr. Veverly Fells and I and D of L4/5 epidural abscess with Gill decompression R L4/5 by Dr Rolena Infante. steroids discontinued due to infection. Patient self extubated 02/09 and mentation improving with resolution of  psychosis and agitation.  Risperdal being weaned.  He continued to have drainage from his back and was taken back  to OR on 02/15 for I and D with placement of VAC by Dr. Rolena Infante with recommendations to continue MWF dressing changes with return to OR next week for wound closure.  Xifaximin discontinued on 2/17, LFTs are improving and he is to follow up with Dr. Alice Reichert 3-4 weeks after discharge.  Therapy ongoing and patient showing improvement in mobility but poor safety awareness. [name] recommended for follow up therapy and patient admitted 11/16/16.    Skilled Therapeutic Interventions          Orders received for cognitive-linguistic evaluation.  Patient completed with [facility] Cognitive Assessment, version 7.3 and demonstrated skills consistent with a score of 29/30 with one point lost for imprecise sentence repetition.  Patient overall Mod I during completion of assessment due to need for increased wait time with complex tasks.  Significant other present at end of session and reports that his thinking has returned to baseline.  No further skilled SLP services are warranted at this time.      SLP Assessment  Patient does not need any further Speech [place] [facility] Services     Patient destination: Home Follow up Recommendations: None Equipment Recommended: None recommended by SLP                     Pain Yes, surgical pain, RN made aware   Prior Functioning WFL  Function:   Cognition Comprehension Comprehension assist level: Follows complex conversation/direction with extra time/assistive device  Expression   Expression assist level: Expresses complex ideas: With extra time/assistive device  Social Interaction Social Interaction assist level: Interacts appropriately with others with medication or extra time (anti-anxiety, antidepressant).  Problem Solving Problem solving assist level: Solves complex problems: With extra time  Memory Memory assist level: More than reasonable amount of time    Recommendations for other services: None   Discharge Criteria: Patient will be discharged from SLP  if patient refuses treatment 3 consecutive times without medical reason, if treatment goals not met, if there is a change in medical status, if patient makes no progress towards goals or if patient is discharged from hospital.  The above assessment, treatment plan, treatment alternatives and goals were discussed and mutually agreed upon: by patient and by family  Carmelia Roller., CCC-SLP [phone]  [place] 11/17/2016, 2:44 PM

## 2016-11-17 NOTE — Progress Notes (Signed)
    Subjective:    Patient reports pain as 4 on 0-10 scale.   Denies CP or SOB.  Voiding without difficulty. Positive BM. Pt denies nausea and is eating well. Pt cleared by inpatient rehab to DC home.  Objective: Vital signs in last 24 hours: Temp:  [98.3 F (36.8 C)-98.7 F (37.1 C)] 98.7 F (37.1 C) (02/20 0500) Pulse Rate:  [72-95] 72 (02/20 0500) Resp:  [15-18] 18 (02/20 0500) BP: (119-126)/(67-68) 119/67 (02/20 0500) SpO2:  [96 %-99 %] 96 % (02/20 0500) Weight:  [106.2 kg (234 lb 2.1 oz)] 106.2 kg (234 lb 2.1 oz) (02/19 1800)  Intake/Output from previous day: 02/19 0701 - 02/20 0700 In: 20 [I.V.:20] Out: 1675 [Urine:1675] Intake/Output this shift: Total I/O In: 240 [P.O.:240] Out: 900 [Urine:900]  Labs:  Recent Labs  11/15/16 0506 11/17/16 0551  HGB 9.3* 9.5*    Recent Labs  11/15/16 0506 11/17/16 0551  WBC 8.9 6.7  RBC 2.77* 2.81*  HCT 29.2* 29.4*  PLT 88* 75*    Recent Labs  11/15/16 0506 11/17/16 0551  NA 137 137  K 3.6 3.7  CL 104 106  CO2 27 27  BUN 9 5*  CREATININE 0.63 0.57*  GLUCOSE 80 94  CALCIUM 8.0* 8.2*   No results for input(s): LABPT, INR in the last 72 hours.  Physical Exam: Neurologically intact ABD soft Sensation intact distally Dorsiflexion/Plantar flexion intact Incision: wound vac in place on posterior wound Compartment soft  Assessment/Plan:    Pt managed by Hospitalist - anx Continue wound vac changes Monday, Wednesday and Friday - recommend home health nurse to manage Pt will present to clinic to see Dr. Rolena Infante on 11/23/16 Right lower extremity incision managed by Dr. Harvin Hazel Pt to present to doctor managing his Hepatitis upon [place], Darla Lesches for Dr. Melina Schools [facility] [phone] 11/17/2016, 1:02 PM

## 2016-11-17 NOTE — Progress Notes (Signed)
Jose Diones, RN Rehab Admission Coordinator Signed Physical Medicine and Rehabilitation  PMR Pre-admission Date of Service: 11/12/2016 2:26 PM  Related encounter: ED to Hosp-Admission (Discharged) from 10/29/2016 in [facility]       '[]' Hide copied text PMR Admission Coordinator Pre-Admission Assessment  Patient: [name] is an 36 y.o., male MRN: [id number] DOB: June 05, 1981 Height: '5\' 11"'  (180.3 cm) Weight: 106.2 kg (234 lb 3.2 oz)                                                                                                                                                  Insurance Information HMO:     PPO:       PCP:       IPA:       80/20:       OTHER:  Blue [employer] Silver Enhanced 400 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 Date: 09/28/16     Deduct:  $400 (met $400)      Out of Pocket Max: $800 (met $800)      Life Max: unlimited [name]: 70%      [name]: 70%/30% with 60 days max Outpatient: 30 combined visits     Co-Pay: $20/visit Home Health: 70%      Co-Pay: 30% DME: 70%     Co-Pay: 30% Providers: in network  Medicaid Application Date:        Case Manager:   Disability Application Date:        Case Worker:    Emergency Contact Information        Contact Information    Name Relation Home Work Mobile   [name] [phone]     [name] Significant other [phone]     [name] [phone]       Current Medical History  Patient Admitting Diagnosis: Paraparesis, right greater than left, secondary to epidural abscess  History of Present Illness: A 36 y.o.malewith history of Hep C, ETOH abuse with  cirrhosis of liver, IV drug abuse--clean for 15 years, chronic back pain, RLE cellulitis, who had recent MRI showing evidence of L4-5 right facet septic arthritis with soft tissue abscess paraspinal musculature 1.3 cm X 1.7 cm C 4.6 cm and L2/3 and L3/4 disc protrusions. He was admitted via MD office on 02/1 and started on IV antibiotics. Dr. Tommy Medal expressed concerns regarding right thigh myositis and MRI RLE revealed extensive abscesses involving posterior compartment of thigh along with myofasciiitis and cellulitis  and abscess all along the course of sciatic/femoral nerve. Surgery delayed due to abnormal LFTs, INR 2.1 and thrombocytopenia. Dr. Fuller Plan consulted for input and recommended prednisone X 30 days for alcoholic hepatitis with jaundice. He developed mental status changes due to delirium tremens requiring intubation and sedation. He was taken to OR on 02/ 04/18 for I and D of right posterior thigh abscess by Dr. Veverly Fells and I and D of L4/5 epidural abscess with Gill decompression R L4/5 by Dr Rolena Infante. Patient self extubated 02/09and required resumption of precedex due to psychosis and agitation.  Therapy evaluations done revealing anxiety, confusion, hallucinations and generalized weakness affecting mobility and self care tasks. [name] recommended for follow up therapy.  Underwent I & D of back abscess with application for VAC therapy on 11/12/16 by Dr. Rolena Infante.  VAC changes are M-W-F with potential to return to OR on 11/25/16 for closure of back incision if healing well.   Past Medical History      Past Medical History:  Diagnosis Date  . Hepatitis C     Family History  family history is not on file.  Prior Rehab/Hospitalizations: Patient broke his big toe in 01/18.  No previous rehab admissions.  Has the patient had major surgery during 100 days prior to admission? No  Current Medications   Current Facility-Administered Medications:  .  0.9 %  sodium chloride infusion, 250 mL,  Intravenous, Continuous, Melina Schools, MD .  cefTRIAXone (ROCEPHIN) 2 g in dextrose 5 % 50 mL IVPB, 2 g, Intravenous, Q24H, Albertine Patricia, MD, 2 g at 11/15/16 1748 .  folic acid (FOLVITE) tablet 1 mg, 1 mg, Oral, Daily, Albertine Patricia, MD, 1 mg at 11/16/16 1020 .  labetalol (NORMODYNE,TRANDATE) injection 10 mg, 10 mg, Intravenous, Q4H PRN, Albertine Patricia, MD .  lactated ringers infusion, , Intravenous, Continuous, Melina Schools, MD, Stopped at 11/12/16 1733 .  lactulose (CHRONULAC) 10 GM/15ML solution 30 g, 30 g, Oral, BID, Albertine Patricia, MD, 30 g at 11/16/16 1019 .  menthol-cetylpyridinium (CEPACOL) lozenge 3 mg, 1 lozenge, Oral, PRN **OR** phenol (CHLORASEPTIC) mouth spray 1 spray, 1 spray, Mouth/Throat, PRN, Melina Schools, MD .  methocarbamol (ROBAXIN) tablet 500 mg, 500 mg, Oral, Q6H PRN **OR** methocarbamol (ROBAXIN) 500 mg in dextrose 5 % 50 mL IVPB, 500 mg, Intravenous, Q6H PRN, Melina Schools, MD .  morphine 2 MG/ML injection 1-4 mg, 1-4 mg, Intravenous, Q3H PRN, Melina Schools, MD, 2 mg at 11/16/16 1210 .  multivitamin with minerals tablet 1 tablet, 1 tablet, Oral, Daily, Albertine Patricia, MD, 1 tablet at 11/16/16 1020 .  ondansetron (ZOFRAN) injection 4 mg, 4 mg, Intravenous, Q4H PRN, Melina Schools, MD .  ondansetron (ZOFRAN) tablet 4 mg, 4 mg, Oral, Q6H PRN **OR** ondansetron (ZOFRAN) injection 4 mg, 4 mg, Intravenous, Q6H PRN, Albertine Patricia, MD .  oxyCODONE (Oxy IR/ROXICODONE) immediate release tablet 10 mg, 10 mg, Oral, Q4H PRN, Melina Schools, MD, 10 mg at 11/16/16 1431 .  pantoprazole sodium (PROTONIX) 40 mg/20 mL oral suspension 40 mg, 40 mg, Oral, Daily, Albertine Patricia, MD, 40 mg at 11/16/16 1019 .  protein supplement (PREMIER PROTEIN) liquid, 11 oz, Oral, BID BM, Albertine Patricia, MD, 11 oz at 11/15/16 0958 .  risperiDONE (RISPERDAL) tablet 0.5 mg, 0.5 mg, Oral, Q12H, Silver Huguenin Elgergawy, MD, 0.5 mg at 11/16/16 1020 .  sodium chloride flush (NS) 0.9 %  injection 3 mL, 3 mL, Intravenous, Q12H, Melina Schools, MD, 3 mL at 11/14/16 2200 .  sodium chloride flush (NS) 0.9 % injection 3 mL, 3 mL, Intravenous, PRN, Melina Schools, MD .  thiamine (VITAMIN B-1) tablet 100 mg, 100 mg, Oral, Daily, Albertine Patricia, MD, 100 mg at 11/16/16 1020 .  traMADol (ULTRAM) tablet 25 mg, 25 mg, Oral, Q6H PRN, Albertine Patricia, MD, 25 mg at 11/14/16 0603  Patients Current Diet: Diet regular Room service appropriate? 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  Independent  Indoor Mobility: Did the patient need assistance with walking from room to room (with or without device)? Independent  Stairs: Did the patient need assistance with internal or external stairs (with or without device)? Independent  Functional Cognition: Did the patient need help planning regular tasks such as shopping or remembering to take medications? Independent  Current Functional Level Cognition  Overall Cognitive Status: Impaired/Different from  baseline Current Attention Level: Selective Orientation Level: Oriented X4 Following Commands: Follows one step commands with increased time, Follows one step commands inconsistently Safety/Judgement: Decreased awareness of deficits, Decreased awareness of safety General Comments: Pt able to recall precautions, but continues to move quickly and require verbal cues to prevent forward bending and twisting.    Extremity Assessment (includes Sensation/Coordination)  Upper Extremity Assessment: Generalized weakness (very shaky during mobility)  Lower Extremity Assessment: Defer to PT evaluation    ADLs  Overall ADL's : Needs assistance/impaired Eating/Feeding: Set up, Sitting Eating/Feeding Details (indicate cue type and reason): Pt set up with lunch at end of session Grooming: Minimal assistance, Sitting, Wash/dry face Grooming Details (indicate cue type and reason): Min assist for sitting balance Upper Body Bathing: Moderate assistance, Sitting Lower Body Bathing: Maximal assistance, Sit to/from stand (simulated) Upper Body Dressing : Maximal assistance, Sitting Upper Body Dressing Details (indicate cue type and reason): to don TLSO Lower Body Dressing: Minimal assistance Lower Body Dressing Details (indicate cue type and reason): Pt able to adjust L sock in sitting with proper technique. Cues for no bending Toilet Transfer: Min guard, Ambulation, RW Toilet Transfer Details (indicate cue type and reason): Simulated by sit to stand from chair with functional mobility Functional mobility during ADLs: Min guard, Rolling walker General ADL Comments: HR in 120s throughout activity. Cues to maintain back precautions throughout all activities.    Mobility  Overal bed mobility: Needs Assistance Bed Mobility: Rolling, Sidelying to Sit Rolling: Supervision Sidelying to sit: Min guard, HOB elevated Sit to sidelying: Max assist, +2 for physical assistance General bed mobility comments: Cues  for log rolling and min guard for precautions with sitting.     Transfers  Overall transfer level: Needs assistance Equipment used: None Transfers: Sit to/from Stand Sit to Stand: Min guard Stand pivot transfers: +2 physical assistance, +2 safety/equipment, Max assist  General transfer comment: Min guard with verbal cues for precautions. Pt able to stand with supervision and no AD.     Ambulation / Gait / Stairs / Wheelchair Mobility  Ambulation/Gait Ambulation/Gait assistance: Physicist, medical (Feet): 275 Feet Assistive device: None Gait Pattern/deviations: Step-through pattern General Gait Details: Verbal cues initially to slow gait speed. Able to maintain precautions during amb without distraction. Gait velocity: decreased Gait velocity interpretation: Below normal speed for age/gender    Posture / Balance Dynamic Sitting Balance Sitting balance - Comments: ModA to don TLSO in sitting.  Balance Overall balance assessment: Needs assistance Sitting-balance support: Feet supported, No upper extremity supported Sitting balance-Leahy Scale: Good Sitting balance - Comments: ModA to don TLSO in sitting.  Standing balance support: During functional activity, No upper extremity supported Standing balance-Leahy Scale: Fair Standing balance comment: Able to stand with 1 UE support to use urinal but LOB x2 during dynamic standing and needed Min A. Stood from 4 mins when RN addressing dressing.    Special needs/care consideration BiPAP/CPAP No, but family believes patient has sleep apnea and needs to be tested CPM No Continuous Drip IV No, but has a PICC line and will need IV antibiotics for 6 weeks. Dialysis No        Life Vest No Oxygen No Special Bed No Trach Size No Wound Vac (area) Yes      Location Back incision Skin:  Has a back incision with VAC in place, right knee incision.  Mom reports a rash on patient's neck in the past 2 weeks                              Bowel mgmt: Last BM 11/14/16  Bladder mgmt: Voiding WDL in urinal and up to bathroom with help. Diabetic mgmt No   Previous Home Environment Living Arrangements:  (girlfriend) Home Care Services: No Additional Comments: pt poor historian due to confusion  Discharge Living Setting Plans for Discharge Living Setting: Lives with (comment), Apartment (Lives with girlfriend.)  Mom says that she has a 1 level townhouse with 3 very shallow steps if patient were to need to go home with her. Type of Home at Discharge: Apartment (2nd level apartment.) Discharge Home Layout: One level (2nd level apartment.) Discharge Home Access: Stairs to enter Entrance Stairs-Number of Steps: 15 steps to 2nd level apartment Does the patient have any problems obtaining your medications?: No  Social/Family/Support Systems Patient Roles: Other (Comment) (Has a girlfriend, mom and brother.) Contact Information: [place] - Guerra - [phone] Anticipated Caregiver: mom, GF and brother Anticipated Caregiver's Contact Information: Elana Alm - girlfriend - [phone] Ability/Limitations of Caregiver: Mom is retired and can assist.  Girlfriend works. Caregiver Availability: Other (Comment) (Family aware of the need for 24/7 supervision.) Discharge Plan Discussed with Primary Caregiver: Yes Is Caregiver In Agreement with Plan?: Yes Does Caregiver/Family have Issues with Lodging/Transportation while Pt is in Rehab?: No  Goals/Additional Needs Patient/Family Goal for Rehab: Pt mod I, OT supervision and SLP supervision goals Expected length of stay: 10 days Cultural Considerations: [employer]/Baptist Dietary Needs: Regular diet, thin liquids Equipment Needs: TBD Pt/Family Agrees to Admission and willing to participate: Yes Program Orientation Provided & Reviewed with Pt/Caregiver Including Roles  & Responsibilities: Yes  Decrease burden of Care through IP rehab admission: N/A  Possible need for  [name] placement upon discharge: Not planned.  Patient has been cleared by ID to have IV antibiotics at  home with family supervision.  He will not need [name] for IV antibiotic therapy.  Patient Condition: This patient's medical and functional status has changed since the consult dated: 11/05/16 in which the Rehabilitation Physician determined and documented that the patient's condition is appropriate for intensive rehabilitative care in an inpatient rehabilitation facility. See "History of Present Illness" (above) for medical update. Functional changes are:  Currently requiring minguard to ambulate 275 feet RW. Patient's medical and functional status update has been discussed with the Rehabilitation physician and patient remains appropriate for inpatient rehabilitation. Will admit to inpatient rehab today.  Preadmission Screen Completed By:  [name], 11/16/2016 3:49 PM ______________________________________________________________________   Discussed status with Dr. Posey Pronto on 11/16/16 at 1549 and received telephone approval for admission today.  Admission Coordinator:  [name], time1549/Date 11/16/17       Cosigned by

## 2016-11-17 NOTE — Progress Notes (Signed)
Occupational Therapy Session Note  Patient Details  Name: [name] MRN: [id number] Date of Birth: November 13, 1980  Today's Date: 11/17/2016 OT Individual Time: [id number] OT Individual Time Calculation (min): 30 min    Short Term Goals: Week 1:  OT Short Term Goal 1 (Week 1): LTG=STG  Skilled Therapeutic Interventions/Progress Updates:  Pt worked on donning brace EOB to start session.  Min instructional cueing to make sure the back part of the brace overlapped the front but he was able to complete the rest without assistance. Therapist re-dressed LLE as bandage had fallen down and increased drainage was noted from the leg.  This was completed in standing without any difficulty.  Progressed to the therapy gym for use of the [employer].  He was able to complete Random Control with 85% accuracy on the easiest setting.  When progressing to unstable platform, he needed use of the UE support at times to maintain his balance and only scored 30%.  Finished session with ambulation back to the room.  When working pt able to state back precautions and follow throughout session.  Reacher utilized for picking items up off of the floor in his room in order to follow back precautions.  Pt left sitting EOB for next session with SLP.   Therapy Documentation Precautions:  Precautions Precautions: Back Precaution Comments: Able to recall 3/3 precautions. Can self correct when not adhering during functional tasks.. Pt also with wound vac from back and R posterior thigh wound. Required Braces or Orthoses: Spinal Brace Spinal Brace: Applied in sitting position, Thoracolumbosacral orthotic Restrictions Weight Bearing Restrictions: No  Pain: Pain Assessment Pain Assessment: 0-10 Pain Score: 3  Pain Type: Surgical pain Pain Location: Back Pain Orientation: Mid Patients Stated Pain Goal: 2 Pain Intervention(s): Medication (See eMAR) ADL: See Function Navigator for Current Functional Status.   Therapy/Group:  Individual Therapy  Anselma Herbel OTR/L 11/17/2016, 4:29 PM

## 2016-11-17 NOTE — Progress Notes (Signed)
Jose Blake, MD Physician Signed Physical Medicine and Rehabilitation  Consult Note Date of Service: 11/05/2016 12:42 PM  Related encounter: ED to Hosp-Admission (Discharged) from 10/29/2016 in [place] All Collapse All   [] Hide copied text [] Hover for attribution information      Physical Medicine and Rehabilitation Consult   Reason for Consult: Epidural and right thigh abscess complicated by delirium tremens.   Referring Physician: Dr. Titus Mould    HPI: [name] is a 36 y.o. male with history of Hep C, aETOH abuse with cirrhosis of liver, IV drug abuse--clean for 15 years, chronic back pain, RLE cellulitis, who had recent MRI showing evidence of L4-5 right facet septic arthritis with soft tissue abscess paraspinal musculature 1.3 cm X 1.7 cm C 4.6 cm and  L2/3 and L3/4 disc protrusions.  He was admitted via MD office on 02/1 and started on IV antibiotics. Dr. Tommy Medal expressed concerns regarding right thigh myositis and MRI RLE revealed extensive abscesses involving posterior compartment of thigh along with myofasciiitis and cellulitis and abscess all along the course of sciatic/femoral nerve.  Surgery delayed due to abnormal LFTs, INR 2.1 and thrombocytopenia.  Dr. Fuller Plan consulted for input and recommended prednisone X 30 days for alcoholic hepatitis with jaundice. He developed mental status changes due to delirium tremens requiring intubation and sedation. He was taken to OR on 02/ 04/18 for I and D of right posterior thigh abscess by Dr. Veverly Fells and I and D of L4/5 epidural abscess with Gill decompression R L4/5 by Dr Rolena Infante. Patient self extubated yesterday and has required resumption of precedex due to psychosis and agitation. Therapy evaluations done yesterday revealing anxiety, confusion, hallucinations and generalized weakness affecting mobility and self care tasks. [name] recommended for follow up therapy.    ROS  Unable to  obtain secondary to confusion      Past Medical History:  Diagnosis Date  . Hepatitis C          Past Surgical History:  Procedure Laterality Date  . DECOMPRESSIVE LUMBAR LAMINECTOMY LEVEL 1 N/A 11/01/2016   Procedure: DECOMPRESSIVE POSTERIOR LUMBAR LAMINECTOMY LEVEL 4-5;  Surgeon: Melina Schools, MD;  Location: [facility];  Service: Orthopedics;  Laterality: N/A;  . HERNIA REPAIR    . I&D EXTREMITY Right 11/01/2016   Procedure: IRRIGATION AND DEBRIDEMENT ABSCESS RIGHT THIGH;  Surgeon: Melina Schools, MD;  Location: [facility];  Service: Orthopedics;  Laterality: Right;    History reviewed. No pertinent family history.    Social History:  Single- lives with girlfriend? Works for a [employer]? Per  reports that he has been smoking.  He has never used smokeless tobacco.  Per reports that he drinks alcohol--a pint occasionally. Per  reports that he does not use drugs.    Allergies: No Known Allergies          Medications Prior to Admission  Medication Sig Dispense Refill  . cyclobenzaprine (FLEXERIL) 10 MG tablet Take 1 tablet (10 mg total) by mouth 2 (two) times daily as needed for muscle spasms. 20 tablet 0  . HYDROcodone-acetaminophen (NORCO/VICODIN) 5-325 MG tablet Take 2 tablets by mouth every 4 (four) hours as needed. 10 tablet 0  . naproxen (NAPROSYN) 500 MG tablet Take 1 tablet (500 mg total) by mouth 2 (two) times daily. 30 tablet 0  . promethazine (PHENERGAN) 25 MG tablet Take 25 mg by mouth every 6 (six) hours as needed for nausea or vomiting.    Marland Kitchen  diclofenac sodium (VOLTAREN) 1 % GEL Apply 4 g topically 4 (four) times daily. (Patient not taking: Reported on 10/29/2016) 100 g 1  . predniSONE (DELTASONE) 20 MG tablet Take 2 tablets (40 mg total) by mouth daily. 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[employer] Status:  Mobility: Bed Mobility Overal bed mobility: Needs Assistance Bed Mobility: Sit to Sidelying Rolling: Mod assist, Min assist Sidelying to sit: Mod assist Sit to sidelying: Mod assist, +2 for physical assistance, +2 for safety/equipment General bed mobility comments: max directional v/c's for sequencing, modA for trunk management and LE management back into bed Transfers Overall transfer level: Needs assistance Equipment used: 2 person hand held assist Transfers: Sit to/from Stand, Stand Pivot Transfers Sit to Stand: Mod assist, Max assist, +2 physical assistance, +2 safety/equipment Stand pivot transfers: +2 physical assistance, +2 safety/equipment, Max assist General transfer comment: pt with increased anxiety, reaching for everything and increased difficulty following commands. Pt required 2 attempts to return to bed Ambulation/Gait General Gait Details: no safe  ADL: ADL Overall ADL's : Needs assistance/impaired Grooming: Minimal assistance, Sitting, Wash/dry face Grooming Details (indicate cue type and reason): Min assist for sitting balance Upper Body Bathing: Moderate assistance, Sitting Lower Body Bathing: Maximal assistance, +2 for physical assistance, Sit to/from stand Upper Body Dressing : Moderate assistance, Sitting Lower Body Dressing: Maximal assistance, +2 for physical assistance, Sit to/from stand Lower Body Dressing Details (indicate cue type and reason): Max assist to don socks in sitting Toilet Transfer: Maximal assistance, +2 for physical assistance, Stand-pivot, BSC Toilet Transfer Details (indicate cue type and reason): Simulated by stand pivot to chair Functional mobility during ADLs: Maximal assistance, +2 for physical assistance, Cueing for safety, Cueing for sequencing (for stand pivot  only)  Cognition: Cognition Overall Cognitive Status: Impaired/Different from baseline Orientation Level: Oriented to person, Oriented to time, Disoriented to place, Disoriented to situation Cognition Arousal/Alertness: Awake/alert Behavior During Therapy: Restless, Impulsive Overall Cognitive Status: Impaired/Different from baseline Area of Impairment: Orientation, Attention, Memory, Following commands, Safety/judgement, Awareness, Problem solving Orientation Level: Disoriented to, Time Current Attention Level: Sustained Memory: Decreased recall of precautions Following Commands: Follows one step commands with increased time, Follows one step commands consistently Safety/Judgement: Decreased awareness of safety, Decreased awareness of deficits Awareness: Intellectual Problem Solving: Slow processing, Requires verbal cues, Difficulty sequencing, Requires tactile cues General Comments: pt with periods of hallucinations ie. wrap the stuff up in the safe, someones trying to choke me but pt easily re-directed   Blood pressure (!) 142/81, pulse 73, temperature 100.3 F (37.9 C), temperature source Oral, resp. rate (!) 21, height 5\' 11"  (1.803 m), weight 120.7 kg (266 lb 1.6 oz), SpO2 94 %. Physical Exam  Nursing note and vitals reviewed. Constitutional: He appears well-developed and well-nourished. He appears lethargic. He has a sickly appearance. No distress. He is restrained. Nasal cannula in place.  Obese male in four points restraints. Jaundiced appearing. Opened eyes minimally--restless and confused.   HENT:  Head: Normocephalic and atraumatic.  Eyes: EOM are normal. Pupils are equal, round, and reactive to light. Scleral icterus is present.  Neck: Normal range of motion. Neck supple.  Cardiovascular: Normal rate and regular rhythm.   Respiratory: Effort normal. He has decreased breath sounds. He has  no wheezes.  GI: Soft. Bowel sounds are normal. He exhibits no distension. There  is no tenderness.  Genitourinary:  Genitourinary Comments: Foley in place  Musculoskeletal:  Right thigh incision intact with sutures in place and moderate amount of bloody drainage on ABD pad.   Neurological: He appears lethargic.  Sedated--arouses easily but unable to stay awake, Language of confusion. Oriented to self only. Moves all four.   Skin: Skin is warm and dry. He is not diaphoretic.  , Oriented to hospital but not to time. Motor strength Limited due to mental status. Not attending very well. He has normal and equal grip strength bilaterally. Right lower extremity strength is 3 minus, hip flexors, knee extension, ankle dorsiflexion, left lower extremity 4 minus, hip flexion, extension, ankle dorsi flexion. Pain inhibition on the right side, but overall level of alertness impacts ability to participate with exam Sensation difficult to formally assess. However, he does withdraw slightly and grimaces to pinch on the toes bilaterally  Lab Results Last 24 Hours       Results for orders placed or performed during the hospital encounter of 10/29/16 (from the past 24 hour(s))  Glucose, capillary     Status: Abnormal   Collection Time: 11/04/16  4:15 PM  Result Value Ref Range   Glucose-Capillary 105 (H) 65 - 99 mg/dL   Comment 1 Document in Chart   Glucose, capillary     Status: Abnormal   Collection Time: 11/04/16  7:32 PM  Result Value Ref Range   Glucose-Capillary 115 (H) 65 - 99 mg/dL   Comment 1 Notify RN    Comment 2 Document in Chart   Glucose, capillary     Status: Abnormal   Collection Time: 11/04/16 11:17 PM  Result Value Ref Range   Glucose-Capillary 105 (H) 65 - 99 mg/dL   Comment 1 Notify RN    Comment 2 Document in Chart   CBC     Status: Abnormal   Collection Time: 11/05/16  3:00 AM  Result Value Ref Range   WBC 14.5 (H) 4.0 - 10.5 K/uL   RBC 2.74 (L) 4.22 - 5.81 MIL/uL   Hemoglobin 9.1 (L) 13.0 - 17.0 g/dL   HCT 28.0 (L) 39.0 - 52.0 %    MCV 102.2 (H) 78.0 - 100.0 fL   MCH 33.2 26.0 - 34.0 pg   MCHC 32.5 30.0 - 36.0 g/dL   RDW 18.6 (H) 11.5 - 15.5 %   Platelets 127 (L) 150 - 400 K/uL  Basic metabolic panel     Status: Abnormal   Collection Time: 11/05/16  3:00 AM  Result Value Ref Range   Sodium 141 135 - 145 mmol/L   Potassium 3.4 (L) 3.5 - 5.1 mmol/L   Chloride 109 101 - 111 mmol/L   CO2 24 22 - 32 mmol/L   Glucose, Bld 116 (H) 65 - 99 mg/dL   BUN 12 6 - 20 mg/dL   Creatinine, Ser 0.65 0.61 - 1.24 mg/dL   Calcium 8.9 8.9 - 10.3 mg/dL   GFR calc non Af Amer >60 >60 mL/min   GFR calc Af Amer >60 >60 mL/min   Anion gap 8 5 - 15  Hepatic function panel     Status: Abnormal   Collection Time: 11/05/16  3:00 AM  Result Value Ref Range   Total Protein 5.6 (L) 6.5 - 8.1 g/dL   Albumin 1.8 (L) 3.5 - 5.0 g/dL   AST 130 (H) 15 - 41 U/L  ALT 60 17 - 63 U/L   Alkaline Phosphatase 115 38 - 126 U/L   Total Bilirubin 10.5 (H) 0.3 - 1.2 mg/dL   Bilirubin, Direct 5.3 (H) 0.1 - 0.5 mg/dL   Indirect Bilirubin 5.2 (H) 0.3 - 0.9 mg/dL  Protime-INR     Status: Abnormal   Collection Time: 11/05/16  3:00 AM  Result Value Ref Range   Prothrombin Time 22.9 (H) 11.4 - 15.2 seconds   INR 2.00   Glucose, capillary     Status: Abnormal   Collection Time: 11/05/16  3:36 AM  Result Value Ref Range   Glucose-Capillary 112 (H) 65 - 99 mg/dL   Comment 1 Notify RN    Comment 2 Document in Chart   Glucose, capillary     Status: Abnormal   Collection Time: 11/05/16  8:28 AM  Result Value Ref Range   Glucose-Capillary 129 (H) 65 - 99 mg/dL   Comment 1 Notify RN    Comment 2 Document in Chart   Glucose, capillary     Status: Abnormal   Collection Time: 11/05/16 12:24 PM  Result Value Ref Range   Glucose-Capillary 139 (H) 65 - 99 mg/dL   Comment 1 Notify RN    Comment 2 Document in Chart       Imaging Results (Last 48 hours)  Dg Chest Port 1 View  Result Date:  11/03/2016 CLINICAL DATA:  Repositioning of endotracheal tube. Tube was pulled earlier today. EXAM: PORTABLE CHEST 1 VIEW COMPARISON:  11/01/2016 FINDINGS: Tip of endotracheal tube is 3.3 cm above the carina in satisfactory position. Left IJ central line catheter is noted at the cavoatrial junction. No pneumothorax. Gastric tube extends below the left hemidiaphragm and appears to coil back towards the expected location of the gastric cardia. Low lung volumes with crowding of interstitial lung markings. Stable cardiomegaly retrocardiac consolidations are not entirely excluded. IMPRESSION: 1. Satisfactory support line and tube positions. 2. Stable cardiomegaly with mild interstitial edema. Cannot exclude trace right effusion nor retrocardiac pulmonary consolidation versus atelectasis. Electronically Signed   By: Ashley Royalty M.D.   On: 11/03/2016 14:59   Dg Abd Portable 1v  Result Date: 11/03/2016 CLINICAL DATA:  OG tube placement EXAM: PORTABLE ABDOMEN - 1 VIEW COMPARISON:  11/01/2016 FINDINGS: Esophageal tube is looped in the stomach, the tip and side port overlie the mid to distal stomach. Gas-filled slightly enlarged bowel loops in the upper abdomen. Improved aeration of left lung base. IMPRESSION: Esophageal tube tip projects over the mid to distal stomach Electronically Signed   By: Donavan Foil M.D.   On: 11/03/2016 23:35     Assessment/Plan: Diagnosis: Paraparesis, right greater than left, secondary to epidural abscess 1. Does the need for close, 24 hr/day medical supervision in concert with the patient's rehab needs make it unreasonable for this patient to be served in a less intensive setting? Yes 2. Co-Morbidities requiring supervision/potential complications: Polysubstance abuse, alcoholic hepatitis, right thigh myositis, bacteremia 3. Due to bladder management, bowel management, safety, skin/wound care, disease management, medication administration, pain management and patient education, does  the patient require 24 hr/day rehab nursing? Yes 4. Does the patient require coordinated care of a physician, rehab nurse, PT (1-2 hrs/day, 5 days/week), OT (1-2 hrs/day, 5 days/week) and SLP (.5-1 hrs/day, 5 days/week) to address physical and functional deficits in the context of the above medical diagnosis(es)? Yes Addressing deficits in the following areas: balance, endurance, locomotion, strength, transferring, bowel/bladder control, bathing, dressing, feeding, grooming, toileting, cognition, speech, language,  swallowing and psychosocial support 5. Can the patient actively participate in an intensive therapy program of at least 3 hrs of therapy per day at least 5 days per week? Yes 6. The potential for patient to make measurable gains while on inpatient rehab is good 7. Anticipated functional outcomes upon discharge from inpatient rehab are modified independent  with PT, supervision with OT, supervision with SLP. 8. Estimated rehab length of stay to reach the above functional goals is: 14-18d 9. Does the patient have adequate social supports and living environment to accommodate these discharge functional goals? Potentially 10. Anticipated D/C setting: Home 11. Anticipated post D/C treatments: [facility] therapy 12. Overall Rehab/Functional Prognosis: good  RECOMMENDATIONS: This patient's condition is appropriate for continued rehabilitative care in the following setting: [name] Patient has agreed to participate in recommended program. N/A Note that insurance prior authorization may be required for reimbursement for recommended care.  Comment: Mental status, still limiting rehabilitation participation, we will follow PT, OT participation in acute care   Bary Leriche, PA-C 11/05/2016

## 2016-11-17 NOTE — Evaluation (Signed)
Physical Therapy Assessment and Plan  Patient Details  Name: [name] MRN: [id number] Date of Birth: August 27, 1981  PT Diagnosis: Difficulty walking, Low back pain and Pain in RLE Rehab Potential: Excellent ELOS: 1-2 days   Today's Date: 11/17/2016 PT Individual Time: 4098-1191 PT Individual Time Calculation (min): 60 min    Problem List:  Patient Active Problem List   Diagnosis Date Noted  . 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 Abnormality of gait   . Post-operative pain   . Infective myositis of right thigh   . Coagulopathy ([place])   . DTs (delirium tremens) ([place])   . Transaminitis   . Infection 11/12/2016  . Alcoholic cirrhosis of liver without ascites ([place])   . Goals of care, counseling/discussion   . Palliative care by specialist   . Pyogenic arthritis of multiple sites ([place])   . Traumatic compartment syndrome of other sites [facility])   . Alcoholic liver failure ([place])   . Traumatic compartment syndrome of lower extremity ([place])   . Infection of lumbar spine ([place]) 11/01/2016  . Delirium tremens ([place])   . Abnormal transaminases   . Alcoholic hepatitis without ascites   . ETOH abuse   . Abscess of right lower extremity   . Cellulitis and abscess of right lower extremity   . Hypernatremia   . Cellulitis of right lower extremity 10/30/2016  . EtOH dependence ([place]) 10/30/2016  . Thrombocytopenia ([place]) 10/30/2016  . Cirrhosis ([place])   . Paraspinal abscess ([place])   . Chronic hepatitis C without hepatic coma ([place])   . Hepatitis   . Epidural abscess 10/29/2016  . Liver failure ([place]) 10/29/2016  . Sepsis ([place]) 10/29/2016  . Hyponatremia 10/29/2016    Past Medical History:  Past Medical History:  Diagnosis Date  . Hepatitis C    Past Surgical History:  Past Surgical History:  Procedure Laterality Date  . DECOMPRESSIVE LUMBAR LAMINECTOMY LEVEL 1 N/A 11/01/2016   Procedure: DECOMPRESSIVE POSTERIOR LUMBAR LAMINECTOMY LEVEL 4-5;  Surgeon: Melina Schools, MD;  Location:  [place];  Service: Orthopedics;  Laterality: N/A;  . HERNIA REPAIR    . I&D EXTREMITY Right 11/01/2016   Procedure: IRRIGATION AND DEBRIDEMENT ABSCESS RIGHT THIGH;  Surgeon: Melina Schools, MD;  Location: [place];  Service: Orthopedics;  Laterality: Right;  . LUMBAR WOUND DEBRIDEMENT N/A 11/12/2016   Procedure: LUMBAR WOUND DEBRIDEMENT;  Surgeon: Melina Schools, MD;  Location: [place];  Service: Orthopedics;  Laterality: N/A;    Assessment & Plan Clinical Impression: Patient is a 36 y.o. year old male with recent admission to the hospital with history of Hep C, aETOH abuse with cirrhosis of liver, IV drug abuse--clean for 15 years, chronic back pain, RLE cellulitis, who had recent MRI showing evidence of L4-5 right facet septic arthritis with soft tissue abscess paraspinal musculature 1.3 cm X 1.7 cm C 4.6 cm and L2/3 and L3/4 disc protrusions. He was admitted via MD office on 02/1 and started on IV antibiotics. Dr. Tommy Medal expressed concerns regarding right thigh myositis and MRI RLE revealed extensive abscesses involving posterior compartment of thigh along with myofasciiitis and cellulitis and abscess all along the course of sciatic/femoral nerve. Surgery delayed due to abnormal LFTs, INR 2.1 and thrombocytopenia. Dr. Fuller Plan consulted for input and recommended prednisone X 30 days for alcoholic hepatitis with jaundice. He developed mental status changes due to delirium tremens requiring intubation and sedation. He was taken to OR on 02/ 04/18 for I and D of right posterior thigh abscess by Dr. Veverly Fells  and I and D of L4/5 epidural abscess with Gill decompression R L4/5 by Dr Rolena Infante. steroids discontinued due to infection. Patient self extubated 02/09 and mentation improving with resolution of  psychosis and agitation. Risperdal being weaned.    He continued to have drainage from his back and was taken back to OR on 02/15 for I and D with placement of VAC by Dr. Rolena Infante. VAC changed at bedside today with no  evidence of active drainage. To continue MWF dressing changes with return to OR next week for wound closure.  Xifaximin discontinued on 2/17, LFTs are improving and he is to follow up with Dr. Alice Reichert 3-4 weeks after discharge.  Therapy ongoing and patient showing improvement in mobility but poor safety awareness. [name] recommended for follow up therapy. .  Patient transferred to [name] on 11/16/2016 .   Patient currently requires supervision with mobility secondary to muscle weakness, decreased cardiorespiratoy endurance and decreased standing balance, decreased balance strategies and difficulty maintaining precautions.  Prior to hospitalization, patient was independent  with mobility and lived with   in a [place] home.  Home access is 15Stairs to enter.  Patient will benefit from skilled PT intervention to maximize safe functional mobility, minimize fall risk and decrease caregiver burden for planned discharge home with intermittent assist.  Anticipate patient will benefit from follow up [facility] at discharge.  PT Assessment Rehab Potential (ACUTE/IP ONLY): Excellent PT Patient demonstrates impairments in the following area(s): Balance;Pain;Skin Integrity;Endurance PT Transfers Functional Problem(s): Bed Mobility;Bed to Chair;Car;Furniture PT Locomotion Functional Problem(s): Ambulation;Stairs PT Plan PT Intensity: Minimum of 1-2 x/day ,45 to 90 minutes PT Frequency: 5 out of 7 days PT Duration Estimated Length of Stay: 1-2 days PT Treatment/Interventions: Ambulation/gait training;Balance/vestibular training;Community reintegration;Discharge planning;Disease management/prevention;DME/adaptive equipment instruction;Functional mobility training;Neuromuscular re-education;Pain management;Patient/family education;Psychosocial support;Skin care/wound management;Splinting/orthotics;Stair training;Therapeutic Exercise;Therapeutic Activities;UE/LE Coordination activities PT Transfers Anticipated Outcome(s): mod I  overall PT Locomotion Anticipated Outcome(s): mod I ambulatory and for stairs PT Recommendation Follow Up Recommendations: Home health PT Patient destination: Home Equipment Recommended: None recommended by PT  Skilled Therapeutic Intervention Evaluation completed (see details above and below) with education on PT POC and goals and individual treatment initiated with focus on donning/doffing TLSO, reviewing back precautions, functional transfers including toileting, simulated car, and basic transfers, gait in controlled, home, and simulated community setting (up/down ramp and over mulched surface), stair negotiation in stairwell up/down 2 flights of stairs, and administering DGI. Pt completed functional mobility at overall supervision level approaching modified independent for basic mobility. Higher level balance activities and stairs required steadying assist to close supervision but pt able to self correct throughout session. Pt able to don and doff TLSO independently. Pt managing wound vac during session without assist.   Pt with increasing drainage noted from RLE wound requiring dressing change x 2 during session due to oversaturation of pad and dripping onto patient's leg during mobility. Able to secure using ACE bandage for support over pad to prevent from sliding down leg. RN notified.   Pt denies concerns in regards to discharge home. Discussed plan to have more portable wound vac for home.   PT Evaluation Precautions/Restrictions Precautions Precautions: Back Precaution Comments: Able to recall 3/3 precautions. Can self correct when not adhering during functional tasks.. Pt also with wound vac from back and R posterior thigh wound. Required Braces or Orthoses: Spinal Brace Spinal Brace: Applied in sitting position;Thoracolumbosacral orthotic Restrictions Weight Bearing Restrictions: No Pain Pain Assessment Pain Assessment: 0-10 Pain Score: 2  Pain Type: Surgical pain Pain  Location: Back Pain Orientation: Mid Pain Frequency: Intermittent Pain Onset: On-going Patients Stated Pain Goal: 2 Pain Intervention(s): Medication (See eMAR) Home Living/Prior Functioning Home Living Type of Home: Apartment Home Access: Stairs to enter Entrance Stairs-Number of Steps: 15 Entrance Stairs-Rails: Right;Left;Can reach both Home Layout: One level Prior Function Level of Independence: Independent with gait;Independent with transfers;Independent with basic ADLs;Independent with homemaking with ambulation  Able to Take Stairs?: Yes  Cognition Overall Cognitive Status: Within Functional Limits for tasks assessed Orientation Level: Oriented X4 Memory: Appears intact Awareness: Appears intact Safety/Judgment: Appears intact Sensation Sensation Light Touch: Appears Intact Coordination Gross Motor Movements are Fluid and Coordinated: Yes Motor  Motor Motor: Within Functional Limits Motor - Skilled Clinical Observations: pain limiting in RLE     Trunk/Postural Assessment  Cervical Assessment Cervical Assessment: Within Functional Limits Thoracic Assessment Thoracic Assessment: Exceptions to [facility]  (back precautions; TLSO) Lumbar Assessment Lumbar Assessment: Exceptions to [facility] (back precautions; TLSO) Postural Control Postural Control: Within Functional Limits  Balance Balance Balance Assessed: Yes Standardized Balance Assessment Standardized Balance Assessment: Dynamic Gait Index Berg Balance Test Sit to Stand: Able to stand without using hands and stabilize independently Standing Unsupported: Able to stand safely 2 minutes Sitting with Back Unsupported but Feet Supported on Floor or Stool: Able to sit safely and securely 2 minutes Stand to Sit: Sits safely with minimal use of hands Transfers: Able to transfer safely, minor use of hands Standing Unsupported with Eyes Closed: Able to stand 10 seconds safely Standing Ubsupported with Feet Together: Able to place  feet together independently and stand 1 minute safely From Standing, Reach Forward with Outstretched Arm: Can reach confidently >25 cm (10") From Standing Position, Pick up Object from Floor: Unable to try/needs assist to keep balance (due to back precautions) From Standing Position, Turn to Look Behind Over each Shoulder: Turn sideways only but maintains balance (to maintain back precautions) Turn 360 Degrees: Able to turn 360 degrees safely in 4 seconds or less Standing Unsupported, Alternately Place Feet on Step/Stool: Able to stand independently and safely and complete 8 steps in 20 seconds Standing Unsupported, One Foot in Front: Able to plae foot ahead of the other independently and hold 30 seconds Standing on One Leg: Able to lift leg independently and hold 5-10 seconds Total Score: 48 Dynamic Gait Index Level Surface: Normal Change in Gait Speed: Normal Gait with Horizontal Head Turns: Normal Gait with Vertical Head Turns: Normal Gait and Pivot Turn: Moderate Impairment (LOB with quick turn; supervision with slow turn) Step Over Obstacle: Mild Impairment Step Around Obstacles: Normal Steps: Mild Impairment Total Score: 20 Static Sitting Balance Static Sitting - Level of Assistance: 6: Modified independent (Device/Increase time) Dynamic Sitting Balance Dynamic Sitting - Level of Assistance: 6: Modified independent (Device/Increase time) Static Standing Balance Static Standing - Level of Assistance: 5: Stand by assistance;6: Modified independent (Device/Increase time) Dynamic Standing Balance Dynamic Standing - Level of Assistance: 5: Stand by assistance Extremity Assessment      RLE Assessment RLE Assessment: Exceptions to [facility] (grossly WFL; pain inhibiition with increased weightbearing) LLE Assessment LLE Assessment: Within Functional Limits   See Function Navigator for Current Functional Status.   Refer to Care Plan for Long Term Goals  Recommendations for other  services: None   Discharge Criteria: Patient will be discharged from PT if patient refuses treatment 3 consecutive times without medical reason, if treatment goals not met, if there is a change in medical status, if patient makes no progress towards goals or if patient  is discharged from hospital.  The above assessment, treatment plan, treatment alternatives and goals were discussed and mutually agreed upon: by patient  Juanna Cao, PT, DPT  11/17/2016, 1:49 PM

## 2016-11-17 NOTE — Progress Notes (Signed)
Subjective/Complaints:   Objective: Vital Signs: Blood pressure 119/67, pulse 72, temperature 98.7 F (37.1 C), temperature source Oral, resp. rate 18, height '5\' 11"'  (1.803 m), weight 106.2 kg (234 lb 2.1 oz), SpO2 96 %. No results found. Results for orders placed or performed during the hospital encounter of 11/16/16 (from the past 72 hour(s))  CBC WITH DIFFERENTIAL     Status: Abnormal   Collection Time: 11/17/16  5:51 AM  Result Value Ref Range   WBC 6.7 4.0 - 10.5 K/uL   RBC 2.81 (L) 4.22 - 5.81 MIL/uL   Hemoglobin 9.5 (L) 13.0 - 17.0 g/dL   HCT 29.4 (L) 39.0 - 52.0 %   MCV 104.6 (H) 78.0 - 100.0 fL   MCH 33.8 26.0 - 34.0 pg   MCHC 32.3 30.0 - 36.0 g/dL   RDW 15.5 11.5 - 15.5 %   Platelets 75 (L) 150 - 400 K/uL    Comment: CONSISTENT WITH PREVIOUS RESULT   Neutrophils Relative % 41 %   Lymphocytes Relative 39 %   Monocytes Relative 12 %   Eosinophils Relative 4 %   Basophils Relative 4 %   Neutro Abs 2.7 1.7 - 7.7 K/uL   Lymphs Abs 2.6 0.7 - 4.0 K/uL   Monocytes Absolute 0.8 0.1 - 1.0 K/uL   Eosinophils Absolute 0.3 0.0 - 0.7 K/uL   Basophils Absolute 0.3 (H) 0.0 - 0.1 K/uL   RBC Morphology POLYCHROMASIA PRESENT     Comment: TARGET CELLS  Comprehensive metabolic panel     Status: Abnormal   Collection Time: 11/17/16  5:51 AM  Result Value Ref Range   Sodium 137 135 - 145 mmol/L   Potassium 3.7 3.5 - 5.1 mmol/L   Chloride 106 101 - 111 mmol/L   CO2 27 22 - 32 mmol/L   Glucose, Bld 94 65 - 99 mg/dL   BUN 5 (L) 6 - 20 mg/dL   Creatinine, Ser 0.57 (L) 0.61 - 1.24 mg/dL   Calcium 8.2 (L) 8.9 - 10.3 mg/dL   Total Protein 5.5 (L) 6.5 - 8.1 g/dL   Albumin 1.8 (L) 3.5 - 5.0 g/dL   AST 69 (H) 15 - 41 U/L   ALT 39 17 - 63 U/L   Alkaline Phosphatase 73 38 - 126 U/L   Total Bilirubin 3.9 (H) 0.3 - 1.2 mg/dL   GFR calc non Af Amer >60 >60 mL/min   GFR calc Af Amer >60 >60 mL/min    Comment: (NOTE) The eGFR has been calculated using the CKD EPI equation. This calculation  has not been validated in all clinical situations. eGFR's persistently <60 mL/min signify possible Chronic Kidney Disease.    Anion gap 4 (L) 5 - 15     HEENT: normal Cardio: RRR and no murmur Resp: CTA B/L and unlabored GI: BS positive and NT, ND Extremity:  Pulses positive and No Edema Skin:   Other Lumbar incision wound vac without surrounding erythema Neuro: Alert/Oriented, Normal Sensory and Abnormal Motor 3- RIght HF, KE, ADF, 4/5 on left , 5/5 in BUE Musc/Skel:  Other left post thigh wound is dressed Gen NAD   Assessment/Plan: 1. Functional deficits secondary to RLE weakness due to Right thigh and epidural abscess which require 3+ hours per day of interdisciplinary therapy in a comprehensive inpatient rehab setting. Physiatrist is providing close team supervision and 24 hour management of active medical problems listed below. Physiatrist and rehab team continue to assess barriers to discharge/monitor patient progress toward functional and medical  goals. FIM:                                  Medical Problem List and Plan: 1.  Requires some assistance for mobility and self-care secondary to epidural abscess. PT, OT, SLP evals today 2.  DVT Prophylaxis/Anticoagulation: Mechanical: Sequential compression devices, below knee Bilateral lower extremities 3. Pain Management: discontinue morphine. Continue oxycodone prn  4. Mood: LCSW to follow for evaluation and support. Will need neuropsych for counseling 5. Neuropsych: This patient is not fully capable of making decisions on his own behalf. 6. Skin/Wound Care:  Monitor right thigh wound --on protein supplement to help promote wound healing. 7. Fluids/Electrolytes/Nutrition: Check I/O. Offer supplements between meals if intake poor. Check lytes in am. 8. 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 Plans for surgery next week? 9. Right thigh abscess/myositis:  Antibiotics narrowed to Ceftriaxone 2 grams daily for 6 weeks (12/13/16) if in supervised setting due to concerns of IV misuse ( or change to Augmentin) --on Day # 19/42.  10. Acute liver failure with hepatitis with jaundice: Jaundice resolved with resolving LFTs.total bili still elevated  Off Xifaximin since 2/17.  Continue lactulose bid.   11. Thrombocytopenia with coagulopathy:  Serial check of CBC--will monitor for signs of bleeding. PLT 75K- vs 88K on 2/18 cont to monitor 12. Delirium Tremens/alcohol abuse: On Risperdal bid. May be able to change to prn   LOS (Days) 1 A FACE TO FACE EVALUATION WAS PERFORMED  [name],[name] 11/17/2016, 7:44 AM

## 2016-11-17 NOTE — Care Management Note (Signed)
Inpatient [facility] Individual Statement of Services  Patient Name:  [name]  Date:  11/17/2016  Welcome to the [facility].  Our goal is to provide you with an individualized program based on your diagnosis and situation, designed to meet your specific needs.  With this comprehensive rehabilitation program, you will be expected to participate in at least 3 hours of rehabilitation therapies Monday-Friday, with modified therapy programming on the weekends.  Your rehabilitation program will include the following services:  Physical Therapy (PT), Occupational Therapy (OT), Speech Therapy (ST), 24 hour per day rehabilitation nursing, Neuropsychology, Case Management (Social Worker), Rehabilitation Medicine, Nutrition Services and Pharmacy Services  Weekly team conferences will be held on Wednesday to discuss your progress.  Your Social Worker will talk with you frequently to get your input and to update you on team discussions.  Team conferences with you and your family in attendance may also be held.  Expected length of stay: 2 days Overall anticipated outcome: mod/i level  Depending on your progress and recovery, your program may change. Your Social Worker will coordinate services and will keep you informed of any changes. Your Social Worker's name and contact numbers are listed  below.  The following services may also be recommended but are not provided by the [facility] will be made to provide these services after discharge if needed.  Arrangements include referral to agencies that provide these services.  Your insurance has been verified to be:  [place] Your primary doctor is:  Nicholes Rough  Pertinent information will be shared with your doctor and your insurance company.  Social Worker:   Ovidio Kin, [place] or ([phone]  Information discussed with and copy given to patient by: Elease Hashimoto, 11/17/2016, 9:01 AM

## 2016-11-17 NOTE — Evaluation (Signed)
Occupational Therapy Assessment and Plan  Patient Details  Name: [name] MRN: [id number] Date of Birth: 11/15/1980  OT Diagnosis: acute pain and muscle weakness (generalized) Rehab Potential: Rehab Potential (ACUTE ONLY): Good ELOS: 1-2 days   Today's Date: 11/17/2016 OT Individual Time: 3532-9924 OT Individual Time Calculation (min): 60 min     Problem List:  Patient Active Problem List   Diagnosis Date Noted  . 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 Abnormality of gait   . Post-operative pain   . Infective myositis of right thigh   . Coagulopathy ([place])   . DTs (delirium tremens) ([place])   . Transaminitis   . Infection 11/12/2016  . Alcoholic cirrhosis of liver without ascites ([place])   . Goals of care, counseling/discussion   . Palliative care by specialist   . Pyogenic arthritis of multiple sites ([place])   . Traumatic compartment syndrome of other sites [facility])   . Alcoholic liver failure ([place])   . Traumatic compartment syndrome of lower extremity ([place])   . Infection of lumbar spine ([place]) 11/01/2016  . Delirium tremens ([place])   . Abnormal transaminases   . Alcoholic hepatitis without ascites   . ETOH abuse   . Abscess of right lower extremity   . Cellulitis and abscess of right lower extremity   . Hypernatremia   . Cellulitis of right lower extremity 10/30/2016  . EtOH dependence ([place]) 10/30/2016  . Thrombocytopenia ([place]) 10/30/2016  . Cirrhosis ([place])   . Paraspinal abscess ([place])   . Chronic hepatitis C without hepatic coma ([place])   . Hepatitis   . Epidural abscess 10/29/2016  . Liver failure ([place]) 10/29/2016  . Sepsis ([place]) 10/29/2016  . Hyponatremia 10/29/2016    Past Medical History:  Past Medical History:  Diagnosis Date  . Hepatitis C    Past Surgical History:  Past Surgical History:  Procedure Laterality Date  . DECOMPRESSIVE LUMBAR LAMINECTOMY LEVEL 1 N/A 11/01/2016   Procedure: DECOMPRESSIVE POSTERIOR LUMBAR LAMINECTOMY LEVEL 4-5;  Surgeon:  Melina Schools, MD;  Location: [place];  Service: Orthopedics;  Laterality: N/A;  . HERNIA REPAIR    . I&D EXTREMITY Right 11/01/2016   Procedure: IRRIGATION AND DEBRIDEMENT ABSCESS RIGHT THIGH;  Surgeon: Melina Schools, MD;  Location: [place];  Service: Orthopedics;  Laterality: Right;  . LUMBAR WOUND DEBRIDEMENT N/A 11/12/2016   Procedure: LUMBAR WOUND DEBRIDEMENT;  Surgeon: Melina Schools, MD;  Location: [place];  Service: Orthopedics;  Laterality: N/A;    Assessment & Plan Clinical Impression: Patient is a 36 y.o. year old male with history of Hep C, aETOH abuse with cirrhosis of liver, IV drug abuse--clean for 15 years, chronic back pain, RLE cellulitis, who had recent MRI showing evidence of L4-5 right facet septic arthritis with soft tissue abscess paraspinal musculature 1.3 cm X 1.7 cm C 4.6 cm and L2/3 and L3/4 disc protrusions. He was admitted via MD office on 02/1 and started on IV antibiotics. Dr. Tommy Medal expressed concerns regarding right thigh myositis and MRI RLE revealed extensive abscesses involving posterior compartment of thigh along with myofasciiitis and cellulitis and abscess all along the course of sciatic/femoral nerve. Surgery delayed due to abnormal LFTs, INR 2.1 and thrombocytopenia. Dr. Fuller Plan consulted for input and recommended prednisone X 30 days for alcoholic hepatitis with jaundice. He developed mental status changes due to delirium tremens requiring intubation and sedation. He was taken to OR on 02/ 04/18 for I and D of right posterior thigh abscess by Dr. Veverly Fells and I and D  of L4/5 epidural abscess with Gill decompression R L4/5 by Dr Rolena Infante. steroids discontinued due to infection. Patient self extubated 02/09 and mentation improving with resolution of  psychosis and agitation.  Risperdal being weaned.    He continued to have drainage from his back and was taken back to OR on 02/15 for I and D with placement of VAC by Dr. Rolena Infante. VAC changed at bedside today with no evidence of  active drainage. To continue MWF dressing changes with return to OR next week for wound closure.  Xifaximin discontinued on 2/17, LFTs are improving and he is to follow up with Dr. Alice Reichert 3-4 weeks after discharge.  Therapy ongoing and patient showing improvement in mobility but poor safety awareness. Patient transferred to [name] on 11/16/2016 .    Patient currently requires supervision to min A with basic self-care skills and functional navigation secondary to muscle weakness, decreased cardiorespiratoy endurance, work on incorporating back precautions and decreased standing balance and difficulty maintaining precautions.  Prior to hospitalization, patient could complete ADL with independent .  Patient will benefit from skilled intervention to decrease level of assist with basic self-care skills and increase independence with basic self-care skills prior to discharge home with care partner.  Anticipate patient will require intermittent supervision and follow up home health.  OT - End of Session Endurance Deficit: No OT Assessment Rehab Potential (ACUTE ONLY): Good Barriers to Discharge: Inaccessible home environment OT Patient demonstrates impairments in the following area(s): Balance;Safety;Motor;Pain OT Basic ADL's Functional Problem(s): Dressing OT Advanced ADL's Functional Problem(s): Simple Meal Preparation OT Transfers Functional Problem(s): Toilet OT Additional Impairment(s): None OT Plan OT Intensity: Minimum of 1-2 x/day, 45 to 90 minutes OT Frequency: 5 out of 7 days OT Duration/Estimated Length of Stay: 1-2 days OT Treatment/Interventions: Balance/vestibular training;Self Care/advanced ADL retraining;Therapeutic Activities;Psychosocial support;DME/adaptive equipment instruction;Functional mobility training OT Self Feeding Anticipated Outcome(s): n/a OT Basic Self-Care Anticipated Outcome(s): mod I  OT Toileting Anticipated Outcome(s): mod I  OT Bathroom Transfers Anticipated  Outcome(s): mod I  OT Recommendation Patient destination: Home Follow Up Recommendations: None Equipment Recommended: Tub/shower seat   Skilled Therapeutic Intervention 1:1 Ot eval initiated with Ot goals, purpose and role discussed. Self care retraining including bed mobility, donning/ doffing back brace, sit to stands, standing balance during functional tasks at the sink, functional mobility without AD, back brace management,etc. Performed BERG score 48/56- limited by back precautions. Pt progressed from min guard to supervision as session progressed.   Per pt reported: pt dressed and bathed with setup from nursing prior to this session.    OT Evaluation Precautions/Restrictions  Precautions Precautions: Back Precaution Comments: Able to recall 3/3 precautions. Can self correct when not adhering during functional tasks.. Pt also with wound vac from back and R posterior thigh wound. Required Braces or Orthoses: Spinal Brace Spinal Brace: Applied in sitting position;Thoracolumbosacral orthotic Restrictions Weight Bearing Restrictions: No General Chart Reviewed: Yes Family/Caregiver Present: No Vital Signs   Pain Pain Assessment Pain Score: 2  Home Living/Prior Functioning Home Living Living Arrangements: Spouse/significant other Available Help at Discharge: Family Type of Home: Apartment Home Access: Stairs to enter Technical brewer of Steps: 15 Entrance Stairs-Rails: Right, Left, Can reach both Home Layout: One level Bathroom Shower/Tub: Multimedia programmer: Standard  Lives With: Significant other Prior Function Level of Independence: Independent with gait, Independent with transfers, Independent with basic ADLs, Independent with homemaking with ambulation  Able to Take Stairs?: Yes ADL ADL ADL Comments: see functional navigator Vision/Perception  Vision- History Baseline  Vision/History: No visual deficits Patient Visual Report: No change from  baseline Vision- Assessment Vision Assessment?: No apparent visual deficits  Cognition Overall Cognitive Status: Within Functional Limits for tasks assessed Arousal/Alertness: Awake/alert Orientation Level: Person;Place;Situation Person: Oriented Place: Oriented Situation: Oriented Year: 2018 Month: February Day of Week: Correct Memory: Appears intact Immediate Memory Recall: Sock;Blue;Bed Memory Recall: Sock;Blue;Bed Memory Recall Sock: Without Cue Memory Recall Blue: Without Cue Memory Recall Bed: Without Cue Attention: Selective Sustained Attention: Appears intact Selective Attention: Appears intact Awareness: Appears intact Problem Solving: Appears intact Safety/Judgment: Appears intact Sensation Sensation Light Touch: Appears Intact Stereognosis: Appears Intact Hot/Cold: Appears Intact Proprioception: Appears Intact Coordination Gross Motor Movements are Fluid and Coordinated: Yes Fine Motor Movements are Fluid and Coordinated: Yes Motor  Motor Motor: Within Functional Limits Motor - Skilled Clinical Observations: pain limiting in RLE Mobility  Transfers Transfers: Sit to Stand;Stand to Sit Sit to Stand: 4: Min guard;5: Supervision Stand to Sit: 5: Supervision  Trunk/Postural Assessment  Cervical Assessment Cervical Assessment: Within Functional Limits Thoracic Assessment Thoracic Assessment: Exceptions to [facility] Lumbar Assessment Lumbar Assessment: Exceptions to [facility] Postural Control Postural Control: Within Functional Limits  Balance Balance Balance Assessed: Yes Standardized Balance Assessment Standardized Balance Assessment: Dynamic Gait Index Berg Balance Test Sit to Stand: Able to stand without using hands and stabilize independently Standing Unsupported: Able to stand safely 2 minutes Sitting with Back Unsupported but Feet Supported on Floor or Stool: Able to sit safely and securely 2 minutes Stand to Sit: Sits safely with minimal use of  hands Transfers: Able to transfer safely, minor use of hands Standing Unsupported with Eyes Closed: Able to stand 10 seconds safely Standing Ubsupported with Feet Together: Able to place feet together independently and stand 1 minute safely From Standing, Reach Forward with Outstretched Arm: Can reach confidently >25 cm (10") From Standing Position, Pick up Object from Floor: Unable to try/needs assist to keep balance (due to back precautions) From Standing Position, Turn to Look Behind Over each Shoulder: Turn sideways only but maintains balance (to maintain back precautions) Turn 360 Degrees: Able to turn 360 degrees safely in 4 seconds or less Standing Unsupported, Alternately Place Feet on Step/Stool: Able to stand independently and safely and complete 8 steps in 20 seconds Standing Unsupported, One Foot in Front: Able to plae foot ahead of the other independently and hold 30 seconds Standing on One Leg: Able to lift leg independently and hold 5-10 seconds Total Score: 48 Dynamic Gait Index Level Surface: Normal Change in Gait Speed: Normal Gait with Horizontal Head Turns: Normal Gait with Vertical Head Turns: Normal Gait and Pivot Turn: Moderate Impairment (LOB with quick turn; supervision with slow turn) Step Over Obstacle: Mild Impairment Step Around Obstacles: Normal Steps: Mild Impairment Total Score: 20 Static Sitting Balance Static Sitting - Level of Assistance: 6: Modified independent (Device/Increase time) Dynamic Sitting Balance Dynamic Sitting - Level of Assistance: 6: Modified independent (Device/Increase time) Static Standing Balance Static Standing - Level of Assistance: 5: Stand by assistance;6: Modified independent (Device/Increase time) Dynamic Standing Balance Dynamic Standing - Level of Assistance: 5: Stand by assistance Extremity/Trunk Assessment RUE Assessment RUE Assessment: Within Functional Limits LUE Assessment LUE Assessment: Within Functional  Limits   See Function Navigator for Current Functional Status.   Refer to Care Plan for Long Term Goals  Recommendations for other services: None    Discharge Criteria: Patient will be discharged from OT if patient refuses treatment 3 consecutive times without medical reason, if treatment goals not met, if  there is a change in medical status, if patient makes no progress towards goals or if patient is discharged from hospital.  The above assessment, treatment plan, treatment alternatives and goals were discussed and mutually agreed upon: by patient  Nicoletta Ba 11/17/2016, 12:49 PM

## 2016-11-17 NOTE — Progress Notes (Signed)
Social Work Patient ID: [name], male   DOB: Jan 10, 1981, 36 y.o.   MRN: [id number]  Spoke with Mom via telephone to discuss how well he is doing here and will be here for a short time. She was very pleased with how well he is doing and somewhat surprised by it. Will let her know when MD makes decision regarding discharge date. Team feels grad day tomorrow and then be Ready for discharge from their standpoint. Will work toward discharge.

## 2016-11-17 NOTE — Progress Notes (Signed)
Social Work Assessment and Plan Social Work Assessment and Plan  Patient Details  Name: [name] MRN: [id number] Date of Birth: April 20, 1981  Today's Date: 11/17/2016  Problem List:  Patient Active Problem List   Diagnosis Date Noted  . 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 Abnormality of gait   . Post-operative pain   . Infective myositis of right thigh   . Coagulopathy ([place])   . DTs (delirium tremens) ([place])   . Transaminitis   . Infection 11/12/2016  . Alcoholic cirrhosis of liver without ascites ([place])   . Goals of care, counseling/discussion   . Palliative care by specialist   . Pyogenic arthritis of multiple sites ([place])   . Traumatic compartment syndrome of other sites [facility])   . Alcoholic liver failure ([place])   . Traumatic compartment syndrome of lower extremity ([place])   . 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 Delirium tremens ([place])   . Abnormal transaminases   . Alcoholic hepatitis without ascites   . ETOH abuse   . Abscess of right lower extremity   . Cellulitis and abscess of right lower extremity   . Hypernatremia   . Cellulitis of right lower extremity 10/30/2016  . EtOH dependence ([place]) 10/30/2016  . Thrombocytopenia ([place]) 10/30/2016  . Cirrhosis ([place])   . Paraspinal abscess ([place])   . Chronic hepatitis C without hepatic coma ([place])   . Hepatitis   . Epidural abscess 10/29/2016  . Liver failure ([place]) 10/29/2016  . Sepsis ([place]) 10/29/2016  . Hyponatremia 10/29/2016   Past Medical History:  Past Medical History:  Diagnosis Date  . Hepatitis C    Past Surgical History:  Past Surgical History:  Procedure Laterality Date  . DECOMPRESSIVE LUMBAR LAMINECTOMY LEVEL 1 N/A 11/01/2016   Procedure: DECOMPRESSIVE POSTERIOR LUMBAR LAMINECTOMY LEVEL 4-5;  Surgeon: Melina Schools, MD;  Location: [place];  Service: Orthopedics;  Laterality: N/A;  . HERNIA REPAIR    . I&D EXTREMITY Right 11/01/2016   Procedure: IRRIGATION AND DEBRIDEMENT ABSCESS RIGHT THIGH;   Surgeon: Melina Schools, MD;  Location: [place];  Service: Orthopedics;  Laterality: Right;  . LUMBAR WOUND DEBRIDEMENT N/A 11/12/2016   Procedure: LUMBAR WOUND DEBRIDEMENT;  Surgeon: Melina Schools, MD;  Location: [place];  Service: Orthopedics;  Laterality: N/A;   Social History:  reports that he has been smoking.  He has never used smokeless tobacco. He reports that he drinks alcohol. He reports that he does not use drugs.  Family / Support Systems Marital Status: Single Patient Roles: Other (Comment), Partner (Employee & Son) Spouse/Significant Other: Hope Stanley [phone] Other Supports: Sylvia-Mom [phone] Anticipated Caregiver: mom, Hope and brother Ability/Limitations of Caregiver: Mom is retired and brother can check on while Hope is working Careers adviser: Intermittent Family Dynamics: Close knit family who help one another, pt is doing quite well and will only be here for a couple days. He is relieved he is doing well and mobile and not have to go to Mom's home but can go to his own at discharge.  Social History Preferred language: English Religion: Non-Denominational Cultural Background: No issues Education: Some college courses Read: Yes Write: Yes Employment Status: Employed Name of Employer: [employer] Return to Work Plans: Plans to return when medically better and recovered from this infection Freight forwarder Issues: No issues Guardian/Conservator: None-according to MD pt is not capable of making his own decisions pt seems to be much improved and is quite aware of what is going on around him. Since  MD reports this will look toward his Mom as next of kin if any decisions need to be made   Abuse/Neglect Physical Abuse: Denies Verbal Abuse: Denies Sexual Abuse: Denies Exploitation of patient/patient's resources: Denies Self-Neglect: Denies  Emotional Status Pt's affect, behavior adn adjustment status: Pt is motivated and wanting  to challenge himself so he can leave soon. He is walking over 500 ft and climbing two flights of stairs on his first day of therapy on rehab. Therapy team feels very quick length of stay. Recent Psychosocial Issues: other health issues managed by PCP Pyschiatric History: No history would benefit from seeing neuro-psych will have see tomorrow prior to discharge on Thursday. He is young and has been through much in this hospitalization Substance Abuse History: History of IV drug abuse but reports 15 years ago. He does drink but feels it is something he can quit at any time. He is not concerned about this  Patient / Family Perceptions, Expectations & Goals Pt/Family understanding of illness & functional limitations: Pt is able to explain his helath issues and does talk with the MD who is rounding and feels he has a good understanding of his plan. He is aware he will need more surgery sometime next week to close his wound in his back. He will be glad when this is over. Premorbid pt/family roles/activities: Boyfriend, son, sibling, employee, friend, etc Anticipated changes in roles/activities/participation: resume Pt/family expectations/goals: Pt states: " I am doing well and want to go home soon."  Hope states: " He is doing well and is walking so far now."  [employer]: None Premorbid Home Care/DME Agencies: None Transportation available at discharge: [facility] and family Resource referrals recommended: Neuropsychology  Discharge Planning Living Arrangements: Spouse/significant other Support Systems: Spouse/significant other, Parent, Other relatives, Friends/neighbors Type of Residence: Private residence Insurance Resources: Multimedia programmer (specify) Nurse, mental health) Financial Resources: Employment Museum/gallery curator Screen Referred: No Living Expenses: Education officer, community Management: Patient, Significant Other Does the patient have any problems obtaining your medications?: No Home Management:  Both he and Clinical research associate Plans: Return home with [place] who works during the day, will have Mom and brother come by to check on him daily while she is working. He is independent level and not using an assistive device. When therapists challenged him he did well and is ambulating over 500 ft. Social Work Anticipated Follow Up Needs: HH/OP  Clinical Impression Pleasant high level gentleman who is ambulating unassisted over 500 feet and doing two flights of stairs. Will need to make arrangements for home vac and question IV antibiotics, since history of drug use. Will work on quick discharge Plan, team feels grad day tomorrow and discharge at latest [place] medical clearance.  Elease Hashimoto 11/17/2016, 12:35 PM

## 2016-11-17 NOTE — Progress Notes (Signed)
Social Work Patient ID: [name], male   DOB: 1980/11/10, 36 y.o.   MRN: [id number]  Contacted [employer] for coverage in NH for IV antibiotics, since pt came to rehab they will cover for this. According to [facility] IV antibiotics doe not skill pt to go to a NH according to their standards. Have informed Pam-PA who had requested coverage be looked into.

## 2016-11-18 ENCOUNTER — Inpatient Hospital Stay (HOSPITAL_COMMUNITY): Payer: BLUE CROSS/BLUE SHIELD

## 2016-11-18 ENCOUNTER — Inpatient Hospital Stay (HOSPITAL_COMMUNITY): Payer: BLUE CROSS/BLUE SHIELD | Admitting: Occupational Therapy

## 2016-11-18 ENCOUNTER — Inpatient Hospital Stay (HOSPITAL_COMMUNITY): Payer: BLUE CROSS/BLUE SHIELD | Admitting: Physical Therapy

## 2016-11-18 DIAGNOSIS — D696 Thrombocytopenia, unspecified: Secondary | ICD-10-CM

## 2016-11-18 MED ORDER — RISPERIDONE 0.25 MG PO TABS
0.2500 mg | ORAL_TABLET | Freq: Two times a day (BID) | ORAL | Status: DC
  Administered 2016-11-18 – 2016-11-19 (×2): 0.25 mg via ORAL
  Filled 2016-11-18 (×2): qty 1

## 2016-11-18 NOTE — Progress Notes (Signed)
   Subjective:    Recheck right posterior thigh s/p I&D 2 weeks ago Pt has been recovering well Denies much pain to right posterior thigh Still dealing with lumbar wound   Patient reports pain as none.  Objective:   VITALS:   Vitals:   11/18/16 0611 11/18/16 1203  BP: 137/84 110/63  Pulse: 77 91  Resp: 18 17  Temp: 98.2 F (36.8 C) 98.5 F (36.9 C)    Right posterior thigh: healing wound to posterior thigh Small area 1cm  Squared of opening with mild drainage No erythema or signs of infection nv intact distally No rashes or edema distally  LABS  Recent Labs  11/17/16 0551  HGB 9.5*  HCT 29.4*  WBC 6.7  PLT 75*     Recent Labs  11/17/16 0551  NA 137  K 3.7  BUN 5*  CREATININE 0.57*  GLUCOSE 94     Assessment/Plan:   S/p I&D right posterior thigh Wound is healing but would recommend daily dry dressing change to the leg Small area was packed with small amount of gauze today to slow the drainage Currently no signs of infection and skin looks appropriate Please contact us for any worsening symptoms with the thigh    Merla Riches, MPAS, PA-C  11/18/2016, 8:32 PM

## 2016-11-18 NOTE — Progress Notes (Signed)
Subjective/Complaints: Pain control ok, sutures removed from post thigh by Ortho PA  ROS  No breathing issues no bowel or bladder issues  Objective: Vital Signs: Blood pressure 137/84, pulse 77, temperature 98.2 F (36.8 C), temperature source Oral, resp. rate 18, height '5\' 11"'  (1.803 m), weight 106.2 kg (234 lb 2.1 oz), SpO2 100 %. No results found. Results for orders placed or performed during the hospital encounter of 11/16/16 (from the past 72 hour(s))  CBC WITH DIFFERENTIAL     Status: Abnormal   Collection Time: 11/17/16  5:51 AM  Result Value Ref Range   WBC 6.7 4.0 - 10.5 K/uL   RBC 2.81 (L) 4.22 - 5.81 MIL/uL   Hemoglobin 9.5 (L) 13.0 - 17.0 g/dL   HCT 29.4 (L) 39.0 - 52.0 %   MCV 104.6 (H) 78.0 - 100.0 fL   MCH 33.8 26.0 - 34.0 pg   MCHC 32.3 30.0 - 36.0 g/dL   RDW 15.5 11.5 - 15.5 %   Platelets 75 (L) 150 - 400 K/uL    Comment: CONSISTENT WITH PREVIOUS RESULT   Neutrophils Relative % 41 %   Lymphocytes Relative 39 %   Monocytes Relative 12 %   Eosinophils Relative 4 %   Basophils Relative 4 %   Neutro Abs 2.7 1.7 - 7.7 K/uL   Lymphs Abs 2.6 0.7 - 4.0 K/uL   Monocytes Absolute 0.8 0.1 - 1.0 K/uL   Eosinophils Absolute 0.3 0.0 - 0.7 K/uL   Basophils Absolute 0.3 (H) 0.0 - 0.1 K/uL   RBC Morphology POLYCHROMASIA PRESENT     Comment: TARGET CELLS  Comprehensive metabolic panel     Status: Abnormal   Collection Time: 11/17/16  5:51 AM  Result Value Ref Range   Sodium 137 135 - 145 mmol/L   Potassium 3.7 3.5 - 5.1 mmol/L   Chloride 106 101 - 111 mmol/L   CO2 27 22 - 32 mmol/L   Glucose, Bld 94 65 - 99 mg/dL   BUN 5 (L) 6 - 20 mg/dL   Creatinine, Ser 0.57 (L) 0.61 - 1.24 mg/dL   Calcium 8.2 (L) 8.9 - 10.3 mg/dL   Total Protein 5.5 (L) 6.5 - 8.1 g/dL   Albumin 1.8 (L) 3.5 - 5.0 g/dL   AST 69 (H) 15 - 41 U/L   ALT 39 17 - 63 U/L   Alkaline Phosphatase 73 38 - 126 U/L   Total Bilirubin 3.9 (H) 0.3 - 1.2 mg/dL   GFR calc non Af Amer >60 >60 mL/min   GFR calc Af  Amer >60 >60 mL/min    Comment: (NOTE) The eGFR has been calculated using the CKD EPI equation. This calculation has not been validated in all clinical situations. eGFR's persistently <60 mL/min signify possible Chronic Kidney Disease.    Anion gap 4 (L) 5 - 15     HEENT: normal Cardio: RRR and no murmur Resp: CTA B/L and unlabored GI: BS positive and NT, ND Extremity:  Pulses positive and No Edema Skin:   Other Lumbar incision wound vac without surrounding erythema Neuro: Alert/Oriented, Normal Sensory and Abnormal Motor 3- RIght HF, KE, ADF, 4/5 on left , 5/5 in BUE Musc/Skel:  Other left post thigh wound is dressed Gen NAD   Assessment/Plan: 1. Functional deficits secondary to RLE weakness due to Right thigh and epidural abscess which require 3+ hours per day of interdisciplinary therapy in a comprehensive inpatient rehab setting. Physiatrist is providing close team supervision and 24 hour management of  active medical problems listed below. Physiatrist and rehab team continue to assess barriers to discharge/monitor patient progress toward functional and medical goals. FIM: Function - Bathing Position: Sitting EOB (per pt report) Body parts bathed by patient: Right arm, Left arm, Chest, Abdomen, Front perineal area, Buttocks, Right upper leg, Left upper leg Bathing not applicable: Right lower leg, Left lower leg, Back Assist Level: Supervision or verbal cues  Function- Upper Body Dressing/Undressing What is the patient wearing?: Orthosis Pull over shirt/dress - Perfomed by patient: Thread/unthread right sleeve, Thread/unthread left sleeve, Put head through opening, Pull shirt over trunk Orthosis activity level: Performed by patient Assist Level: More than reasonable time Function - Lower Body Dressing/Undressing What is the patient wearing?: Underwear, Pants, Non-skid slipper socks Position: Sitting EOB Underwear - Performed by patient: Thread/unthread right underwear leg,  Thread/unthread left underwear leg, Pull underwear up/down Pants- Performed by patient: Thread/unthread right pants leg, Thread/unthread left pants leg, Pull pants up/down Non-skid slipper socks- Performed by patient: Don/doff right sock, Don/doff left sock Assist for footwear: Setup Assist for lower body dressing: Supervision or verbal cues, Touching or steadying assistance (Pt > 75%)  Function - Toileting Toileting steps completed by patient: Adjust clothing prior to toileting, Performs perineal hygiene, Adjust clothing after toileting Toileting Assistive Devices: Grab bar or rail, Other (comment) Assist level: Supervision or verbal cues  Function - Air cabin crew transfer assistive device: Grab bar Assist level to toilet: Supervision or verbal cues Assist level from toilet: Supervision or verbal cues  Function - Chair/bed transfer Chair/bed transfer method: Stand pivot Chair/bed transfer assist level: No Help, no cues, assistive device, takes more than a reasonable amount of time Chair/bed transfer assistive device: Orthosis  Function - Locomotion: Wheelchair Will patient use wheelchair at discharge?: No Function - Locomotion: Ambulation Assistive device: No device, Orthosis Max distance: 200' Assist level: Supervision or verbal cues Assist level: Touching or steadying assistance (Pt > 75%) Assist level: Supervision or verbal cues Assist level: Supervision or verbal cues  Function - Comprehension Comprehension: Auditory Comprehension assist level: Follows basic conversation/direction with extra time/assistive device  Function - Expression Expression: Verbal Expression assist level: Expresses complex ideas: With extra time/assistive device  Function - Social Interaction Social Interaction assist level: Interacts appropriately with others with medication or extra time (anti-anxiety, antidepressant).  Function - Problem Solving Problem solving assist level: Solves  basic 25 - 49% of the time - needs direction more than half the time to initiate, plan or complete simple activities  Function - Memory Memory assist level: More than reasonable amount of time Patient normally able to recall (first 3 days only): Current season, Location of own room, Staff names and faces  Medical Problem List and Plan: 1.  Requires some assistance for mobility and self-care secondary to epidural abscess. PT, OT, SLP , plan d/c in am team conf today 2.  DVT Prophylaxis/Anticoagulation: Mechanical: Sequential compression devices, below knee Bilateral lower extremities 3. Pain Management: discontinue morphine. Continue oxycodone prn  4. Mood: LCSW to follow for evaluation and support. Will need outpt substance abuse eval 5. Neuropsych: This patient is not fully capable of making decisions on his own behalf. 6. Skin/Wound Care:  Monitor right thigh wound --on protein supplement to help promote wound healing. 7. Fluids/Electrolytes/Nutrition: Check I/O. Offer supplements between meals if intake poor. Check lytes in am. 8. 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 Plans for surgery next week? 9. Right thigh abscess/myositis:  Antibiotics narrowed to Ceftriaxone 2 grams daily for 6 weeks (12/13/16) if in supervised setting due to concerns of IV misuse ( or change to Augmentin for d/c) --on Day # 19/42.  10. Acute liver failure with hepatitis with jaundice: Jaundice resolved with resolving LFTs.total bili still elevated  Off Xifaximin since 2/17.  Continue lactulose bid.   11. Thrombocytopenia with coagulopathy:  Serial check of CBC--will monitor for signs of bleeding. PLT 75K- vs 88K on 2/18  12. Delirium Tremens/alcohol abuse: On Risperdal prn   LOS (Days) 2 A FACE TO FACE EVALUATION WAS PERFORMED  Kirin Brandenburger E 11/18/2016, 8:00 AM

## 2016-11-18 NOTE — Progress Notes (Signed)
Discussed antibiotic regimen with Dr. Linus Salmons. He recommended Augmentin 500 mg tid or 875 mg bid (less side effects). Will change patient to Augmentin 875 mg bid at discharge. Patient has follow up with Dr. Rolena Infante for dressing change on Monday and contacted Dr. Veverly Fells for follow up on right thigh.

## 2016-11-18 NOTE — Patient Care Conference (Signed)
Inpatient RehabilitationTeam Conference and Plan of Care Update Date: 11/18/2016   Time: 10:30 AM    Patient Name: [name]      Medical Record Number: [id number]  Date of Birth: October 03, 1980 Sex: Male         Room/Bed: 4W18C/4W18C-01 Payor Info: Payor: BLUE CROSS BLUE SHIELD / Plan: BCBS OTHER / Product Type: *No Product type* /    Admitting Diagnosis: E PIDERAL ABSCESS  Admit Date/Time:  11/16/2016  6:10 PM Admission Comments: No comment available   Primary Diagnosis:  <principal problem not specified> Principal Problem: <principal problem not specified>  Patient Active Problem List   Diagnosis Date Noted  . 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 Abnormality of gait   . Post-operative pain   . Infective myositis of right thigh   . Coagulopathy ([place])   . DTs (delirium tremens) ([place])   . Transaminitis   . Infection 11/12/2016  . Alcoholic cirrhosis of liver without ascites ([place])   . Goals of care, counseling/discussion   . Palliative care by specialist   . Pyogenic arthritis of multiple sites ([place])   . Traumatic compartment syndrome of other sites [facility])   . Alcoholic liver failure ([place])   . Traumatic compartment syndrome of lower extremity ([place])   . 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 Delirium tremens ([place])   . Abnormal transaminases   . Alcoholic hepatitis without ascites   . ETOH abuse   . Abscess of right lower extremity   . Cellulitis and abscess of right lower extremity   . Hypernatremia   . Cellulitis of right lower extremity 10/30/2016  . EtOH dependence ([place]) 10/30/2016  . Thrombocytopenia ([place]) 10/30/2016  . Cirrhosis ([place])   . Paraspinal abscess ([place])   . Chronic hepatitis C without hepatic coma ([place])   . Hepatitis   . Epidural abscess 10/29/2016  . Liver failure ([place]) 10/29/2016  . Sepsis ([place]) 10/29/2016  . Hyponatremia 10/29/2016    Expected Discharge Date: Expected Discharge Date: 11/19/16  Team Members Present: Physician leading  conference: Dr. Alysia Penna Social Worker Present: Ovidio Kin, LCSW Nurse Present: Heather Roberts, RN PT Present: Georjean Mode, Rory Percy, PT OT Present: Clyda Greener, OT SLP Present: Chauncey Cruel) Other (comment) (Happi Overton-SP) PPS Coordinator present : Daiva Nakayama, RN, CRRN     Current Status/Progress Goal Weekly Team Focus  Medical   still draining post thigh, will need wound vac for D/C, ok for po abx  d/c home with [facility] wound care  d/c planning   Bowel/Bladder   Patient continent of bowel and bladder. LBM 11/16/16  Remain continent of bowel and bladder with supervision   Monitor.    Swallow/Nutrition/ Hydration             ADL's   mod I overall   mod I overall   d/c planning, higher level balance with ADLs/ functional mobility and endurance   Mobility   supervision  modified independent  d/c planning, higher level balance   Communication             Safety/Cognition/ Behavioral Observations            Pain   c/o pain to back and right leg. Oxy IR 5mg  q 6 hours prn and 650 mg of robaxin q6 prn   less than 5/10  Assess pain and treat accordingly    Skin   Right thigh incision with dry dressing. Sutures removed and minimal drainage. Wound vac to back incision. Dressing changed  2/21. Draining moderate amount   No new breakdown/infection.   Assess skin q shift and teach wound management for discharge.       *See Care Plan and progress notes for long and short-term goals.  Barriers to Discharge: hx substance abuse    Possible Resolutions to Barriers:  remove picc, po abx    Discharge Planning/Teaching Needs:  Home with girlfriend-[name] who works during the day, Mom and brother can check on him while she is working.       Team Discussion:  Reached goals of mod/i level. Need HHRN for Wound Vac, in room. RN to connect him tomorrow. Will have IV antibiotics tomorrow and then be switched to oral. Merrilee Jansky 48/56. PICC to be discharged tomorrow prior to discharge. Declined  substance abuse resources. Mod/i in room today.  Revisions to Treatment Plan:  DC 2/22   Continued Need for Acute Rehabilitation Level of Care: The patient requires daily medical management by a physician with specialized training in physical medicine and rehabilitation for the following conditions: Daily direction of a multidisciplinary physical rehabilitation program to ensure safe treatment while eliciting the highest outcome that is of practical value to the patient.: Yes Daily medical management of patient stability for increased activity during participation in an intensive rehabilitation regime.: Yes Daily analysis of laboratory values and/or radiology reports with any subsequent need for medication adjustment of medical intervention for : Neurological problems;Post surgical problems;Wound care problems  Elease Hashimoto 11/19/2016, 8:37 AM

## 2016-11-18 NOTE — Progress Notes (Signed)
Social Work Patient ID: [name], male   DOB: [birth year], 36 y.o.   MRN: [id number]  Met with pt to inform MD feels medically ready for discharge tomorrow. Team conference held and pt's goals mod/i-independent Level and discharge tomorrow. Home vac here and will be connected tomorrow, along with PICC being discharged. Pt to go home to apartment and having Mom and brother checking in on while girlfriend works. Pt is agreeable to this plan, [facility] to provide follow up therapy and RN follow. Will contact Mom to inform her of this plan.

## 2016-11-18 NOTE — Consult Note (Addendum)
[place] Nurse wound consult note Reason for Consult: Consult  requested for Vac dressing change.  Pt had surgery by Dr Rolena Infante of the ortho service. Wound type: Full thickness post-op wound to middle back Measurement: 5X3X4.5cm Wound bed: 90% red. 10% yellow Drainage (amount, consistency, odor) mod amt pink drainage in the cannister, no odor Periwound: intact skin surrounding Dressing procedure/placement/frequency: Pt medicated for pain prior to the procedure and tolerated with mod discomfort.  Applied one piece black foam and bridged another piece to his hip and applied the track pad to reduce pressure.  Pt plans to discharge tomorrow and [place] will apply their negative pressure device prior to discharge.  Discussed plan of care and he denies further questions. Please re-consult if further assistance is needed.  Thank-you,  Julien Girt MSN, [place], [name], [place], [place]

## 2016-11-18 NOTE — Progress Notes (Signed)
Physical Therapy Discharge Summary  Patient Details  Name: [name] MRN: [id number] Date of Birth: 1981/05/15  Today's Date: 11/18/2016 PT Individual Time: 1300-1345 PT Individual Time Calculation (min): 45 min    Patient has met 8 of 8 long term goals due to improved activity tolerance, improved balance, increased strength and decreased pain.  Patient to discharge at an ambulatory level Modified Independent.   Patient's care partner is independent to provide the necessary physical assistance at discharge.   Recommendation:  Patient will benefit from ongoing skilled PT services in home health setting to continue to advance safe functional mobility, address ongoing impairments in safety, balance, pain, and minimize fall risk.  Equipment: No equipment provided  Reasons for discharge: treatment goals met and discharge from hospital  Patient/family agrees with progress made and goals achieved: Yes   PT Treatment:  PT instructed patient in gras day assessment to measure progress towards goals; see below for results.   PT Discharge Precautions/Restrictions Precautions Precautions: Back Precaution Comments: Able to recall 3/3 precautions. Can self correct when not adhering during functional tasks.. Pt also with wound vac from back and R posterior thigh wound. Required Braces or Orthoses: Spinal Brace Spinal Brace: Applied in sitting position;Thoracolumbosacral orthotic Restrictions Weight Bearing Restrictions: No Vital Signs Therapy Vitals Temp: 98.5 F (36.9 C) Temp Source: Oral Pulse Rate: 91 Resp: 17 BP: 110/63 Patient Position (if appropriate): Sitting Oxygen Therapy SpO2: 100 % Pain Pain Assessment Pain Assessment: 0-10 Pain Score: 8  Pain Type: Surgical pain Pain Location: Knee Pain Orientation: Posterior;Right Pain Onset: On-going Patients Stated Pain Goal: 4 Pain Intervention(s): Medication (See eMAR) Vision/Perception     Cognition Overall Cognitive  Status: Within Functional Limits for tasks assessed Arousal/Alertness: Awake/alert Orientation Level: Oriented X4 Attention: Selective Sustained Attention: Appears intact Selective Attention: Appears intact Memory: Appears intact Awareness: Appears intact Problem Solving: Appears intact Safety/Judgment: Appears intact Sensation Sensation Light Touch: Appears Intact Stereognosis: Appears Intact Hot/Cold: Appears Intact Proprioception: Appears Intact Coordination Gross Motor Movements are Fluid and Coordinated: Yes Fine Motor Movements are Fluid and Coordinated: Yes Motor  Motor Motor: Within Functional Limits Motor - Skilled Clinical Observations: pain mildly limiting in RLE  Mobility Bed Mobility Bed Mobility: Supine to Sit;Sit to Supine;Rolling Right;Rolling Left Rolling Right: 6: Modified independent (Device/Increase time) Rolling Left: 6: Modified independent (Device/Increase time) Supine to Sit: 6: Modified independent (Device/Increase time) Sit to Supine: 6: Modified independent (Device/Increase time) Transfers Transfers: Yes Sit to Stand: 6: Modified independent (Device/Increase time) Stand to Sit: 6: Modified independent (Device/Increase time)  Car transfer: Modified Independent (device/increase time)  Locomotion  Ambulation Ambulation: Yes Ambulation/Gait Assistance: 6: Modified independent (Device/Increase time) Ambulation Distance (Feet): 300 Feet Assistive device: None Gait Gait: Yes Gait Pattern: Impaired Gait Pattern: Antalgic Stairs / Additional Locomotion Stairs: Yes Stairs Assistance: 6: Modified independent (Device/Increase time) Stair Management Technique: One rail Right Number of Stairs: 20 Height of Stairs: 6 Ramp: 6: Modified independent (Device) Curb: 6: Modified independent (Device/increase time) Product manager Mobility: No  Trunk/Postural Assessment  Cervical Assessment Cervical Assessment: Within Functional  Limits Thoracic Assessment Thoracic Assessment: Exceptions to [facility] Lumbar Assessment Lumbar Assessment: Exceptions to [facility] Postural Control Postural Control: Within Functional Limits  Balance Balance Balance Assessed: Yes Standardized Balance Assessment Standardized Balance Assessment: Dynamic Gait Index Dynamic Gait Index Level Surface: Normal Change in Gait Speed: Normal Gait with Horizontal Head Turns: Normal Gait with Vertical Head Turns: Normal Gait and Pivot Turn: Normal Step Over Obstacle: Mild Impairment Step Around Obstacles: Normal Steps:  Mild Impairment Total Score: 22 Static Sitting Balance Static Sitting - Level of Assistance: 6: Modified independent (Device/Increase time) Dynamic Sitting Balance Dynamic Sitting - Level of Assistance: 6: Modified independent (Device/Increase time) Static Standing Balance Static Standing - Level of Assistance: 6: Modified independent (Device/Increase time) Dynamic Standing Balance Dynamic Standing - Level of Assistance: 6: Modified independent (Device/Increase time) Extremity Assessment      RLE Assessment RLE Assessment: Exceptions to [facility] (grossly WFL but mild increase in pain with testing. ) LLE Assessment LLE Assessment: Within Functional Limits   See Function Navigator for Current Functional Status.  Lorie Phenix 11/18/2016, 1:46 PM

## 2016-11-19 DIAGNOSIS — M4646 Discitis, unspecified, lumbar region: Secondary | ICD-10-CM

## 2016-11-19 DIAGNOSIS — R29898 Other symptoms and signs involving the musculoskeletal system: Secondary | ICD-10-CM

## 2016-11-19 MED ORDER — DEXTROSE 5 % IV SOLN
2.0000 g | INTRAVENOUS | Status: DC
  Administered 2016-11-19: 2 g via INTRAVENOUS
  Filled 2016-11-19 (×2): qty 2

## 2016-11-19 MED ORDER — PANTOPRAZOLE SODIUM 40 MG PO PACK: 40.0000 mg | PACK | Freq: Every day | ORAL | 0 refills | Status: DC

## 2016-11-19 MED ORDER — OXYCODONE HCL 5 MG PO TABS: 5.0000 mg | ORAL_TABLET | Freq: Two times a day (BID) | ORAL | 0 refills | Status: DC | PRN

## 2016-11-19 MED ORDER — LACTULOSE 10 GM/15ML PO SOLN: 30.0000 g | Freq: Two times a day (BID) | ORAL | 0 refills | Status: DC

## 2016-11-19 MED ORDER — THIAMINE HCL 100 MG PO TABS: 100.0000 mg | ORAL_TABLET | Freq: Every day | ORAL | 0 refills | Status: DC

## 2016-11-19 MED ORDER — RISPERIDONE 0.25 MG PO TABS: 0.2500 mg | ORAL_TABLET | Freq: Every day | ORAL | 0 refills | Status: DC

## 2016-11-19 MED ORDER — FOLIC ACID 1 MG PO TABS: 1.0000 mg | ORAL_TABLET | Freq: Every day | ORAL | 0 refills | Status: DC

## 2016-11-19 MED ORDER — AMOXICILLIN-POT CLAVULANATE 875-125 MG PO TABS: 1.0000 | ORAL_TABLET | Freq: Two times a day (BID) | ORAL | 1 refills | Status: DC

## 2016-11-19 MED ORDER — METHOCARBAMOL 500 MG PO TABS: 500.0000 mg | ORAL_TABLET | Freq: Four times a day (QID) | ORAL | 0 refills | Status: DC | PRN

## 2016-11-19 NOTE — Progress Notes (Signed)
11/19/16 1244 nursing Patient discharged to home ambulatory assisted by NT and mother. Offered wheelchair but patient insisted on walking. Patient discharged with [facility] home health wound vac.Discharge instructions done by PA no further questions noted.

## 2016-11-19 NOTE — Discharge Instructions (Signed)
Inpatient Rehab Discharge Instructions  [name] Discharge date and time:    Activities/Precautions/ Functional Status: Activity: no lifting, driving, or strenuous exercise till cleared by MD. Diet: regular diet Wound Care: VAC to be changed on Mon, Wed, Fri by home health nurse   Functional status:  ___ No restrictions     ___ Walk up steps independently ___ 24/7 supervision/assistance   ___ Walk up steps with assistance ___ Intermittent supervision/assistance  ___ Bathe/dress independently ___ Walk with walker     ___ Bathe/dress with assistance ___ Walk Independently    ___ Shower independently ___ Walk with assistance    ___ Shower with assistance _X__ No alcohol     ___ Return to work/school ________   Special Instructions: 1. Apply brace when at the edge of bed. May remove to shower.  2. Cleanse thigh incision with soap and water. Pat dry and apply dry dressing. Change at least couple times a day to keep incision clean and dry.  Contact MD if you develop any problems with your incision/wound--redness, swelling, increase in pain, drainage or if you develop fever or chills.  3. Absolutely no alcohol, tylenol or illicit drugs.     COMMUNITY REFERRALS UPON DISCHARGE:    Home Health:   PT & RN  [place]   [id number]   Date of last service:11/19/2016  Medical Equipment/Items [place]   [phone] DECLINED SUBSTANCE ABUSE RESOURCES-FELT NOT NEEDED    My questions have been answered and I understand these instructions. I will adhere to these goals and the provided educational materials after my discharge from the hospital.  Patient/Caregiver Signature _______________________________ Date __________  Clinician Signature _______________________________________ Date __________  Please bring this form and your medication list with you to all your follow-up doctor's appointments.

## 2016-11-19 NOTE — IPOC Note (Signed)
Overall Plan of Care [facility]) Patient Details Name: [name] MRN: [id number] DOB: 01-14-1981  Admitting Diagnosis: [facility] Problems: Active Problems:   Epidural intraspinal abscess     Functional Problem List: Nursing Medication Management, Pain, Safety, Skin Integrity  PT Balance, Pain, Skin Integrity, Endurance  OT Balance, Safety, Motor, Pain  SLP    TR         Basic ADL's: OT Dressing     Advanced  ADL's: OT Simple Meal Preparation     Transfers: PT Bed Mobility, Bed to Chair, Car, Furniture  OT Toilet     Locomotion: PT Ambulation, Stairs     Additional Impairments: OT None  SLP        TR      Anticipated Outcomes Item Anticipated Outcome  Self Feeding n/a  Swallowing      Basic self-care  mod I   Toileting  mod I    Bathroom Transfers mod I   Bowel/Bladder  Pt will manage bowel/ bladder with min assist by discharge date  Transfers  mod I overall  Locomotion  mod I ambulatory and for stairs  Communication     Cognition     Pain  Pt will rate pain at a 4 on a scale of 1-10  Safety/Judgment  Pt will remain free from falls and injury with mod I assist   Therapy Plan: PT Intensity: Minimum of 1-2 x/day ,45 to 90 minutes PT Frequency: 5 out of 7 days PT Duration Estimated Length of Stay: 1-2 days OT Intensity: Minimum of 1-2 x/day, 45 to 90 minutes OT Frequency: 5 out of 7 days OT Duration/Estimated Length of Stay: 1-2 days         Team Interventions: Nursing Interventions Patient/Family Education, Bladder Management, Bowel Management, Pain Management, Medication Management, Disease Management/Prevention, Skin Care/Wound Management, Discharge Planning, Psychosocial Support  PT interventions Ambulation/gait training, Training and development officer, Community reintegration, Discharge planning, Disease management/prevention, DME/adaptive equipment instruction, Functional mobility training, Neuromuscular re-education, Pain  management, Patient/family education, Psychosocial support, Skin care/wound management, Splinting/orthotics, Stair training, Therapeutic Exercise, Therapeutic Activities, UE/LE Coordination activities  OT Interventions Balance/vestibular training, Self Care/advanced ADL retraining, Therapeutic Activities, Psychosocial support, DME/adaptive equipment instruction, Functional mobility training  SLP Interventions    TR Interventions    SW/CM Interventions Discharge Planning, Psychosocial Support, Patient/Family Education    Team Discharge Planning: Destination: PT-Home ,OT- Home , SLP-Home Projected Follow-up: PT-Home health PT, OT-  None, SLP-None Projected Equipment Needs: PT-None recommended by PT, OT- Tub/shower seat, SLP-None recommended by SLP Equipment Details: PT- , OT-  Patient/family involved in discharge planning: PT- Patient,  OT-Patient, SLP-Patient, Family member/caregiver  MD ELOS: 7d [facility] Rehab Prognosis:  Excellent Assessment:  36 y.o.malewith history of Hep C, aETOH abuse with cirrhosis of liver, IV drug abuse--clean for 15 years, chronic back pain, RLE cellulitis, who had recent MRI showing evidence of L4-5 right facet septic arthritis with soft tissue abscess paraspinal musculature 1.3 cm X 1.7 cm C 4.6 cm and L2/3 and L3/4 disc protrusions. He was admitted via MD office on 02/1 and started on IV antibiotics. Dr. Tommy Medal expressed concerns regarding right thigh myositis and MRI RLE revealed extensive abscesses involving posterior compartment of thigh along with myofasciiitis and cellulitis and abscess all along the course of sciatic/femoral nerve. Surgery delayed due to abnormal LFTs, INR 2.1 and thrombocytopenia. Dr. Fuller Plan consulted for input and recommended prednisone X 30 days for alcoholic hepatitis with jaundice. He developed mental status changes due to delirium  tremens requiring intubation and sedation. He was taken to OR on 02/ 04/18 for I and D of right posterior  thigh abscess by Dr. Veverly Fells and I and D of L4/5 epidural abscess with Gill decompression R L4/5 by Dr Rolena Infante. steroids discontinued due to infection. Patient self extubated 02/09 and mentation improving with resolution of  psychosis and agitation.  Now requiring 24/7 Rehab RN,MD, as well as [name] level PT, OT and SLP.  Treatment team will focus on ADLs and mobility with goals set at Mod I  See Team Conference Notes for weekly updates to the plan of care

## 2016-11-19 NOTE — Discharge Summary (Signed)
Physician Discharge Summary  Patient ID: [name] MRN: [id number] DOB/AGE: [birth year] 36 y.o.  Admit date: 11/16/2016 Discharge date: 11/19/2016  Discharge Diagnoses:  Principal Problem:   Epidural intraspinal abscess Active Problems:   Thrombocytopenia (HCC)   Abnormal transaminases   ETOH abuse   Alcoholic cirrhosis of liver without ascites (HCC)   Weakness of right lower extremity   Discharged Condition: stable.   Significant Diagnostic Studies: N/A   Labs:  Basic Metabolic Panel:  Recent Labs Lab 11/15/16 0506 11/17/16 0551  NA 137 137  K 3.6 3.7  CL 104 106  CO2 27 27  GLUCOSE 80 94  BUN 9 5*  CREATININE 0.63 0.57*  CALCIUM 8.0* 8.2*    CBC:  Recent Labs Lab 11/15/16 0506 11/17/16 0551  WBC 8.9 6.7  NEUTROABS  --  2.7  HGB 9.3* 9.5*  HCT 29.2* 29.4*  MCV 105.4* 104.6*  PLT 88* 75*    CBG: No results for input(s): GLUCAP in the last 168 hours.  Brief HPI:   [name] a 36 y.o.malewith history of Hep C, aETOH abuse with cirrhosis of liver, IV drug abuse--clean for 15 years, chronic back pain, RLE cellulitis, who had recent MRI showing evidence of L4-5 right facet septic arthritis with soft tissue abscess paraspinal musculature 1.3 cm X 1.7 cm C 4.6 cm and L2/3 and L3/4 disc protrusions. He was admitted via MD office on 02/1 and started on IV antibiotics. Work up revealedrevealed extensive abscesses involving posterior compartment of thigh along with myofasciiitis and cellulitis and abscess all along the course of sciatic/femoral nerve. Surgery delayed due to abnormal LFTs, INR 2.1 and thrombocytopenia. Dr. Fuller Plan consulted for input and recommended prednisone X 30 days for alcoholic hepatitis with jaundice. He developed mental status changes due to delirium tremens requiring intubation and sedation. He was taken to OR on 02/ 04/18 for I and D of right posterior thigh abscess by Dr. Veverly Fells and I and D of L4/5 epidural abscess by Dr Rolena Infante.  Steroids discontinued due to infection.  He continued to have drainage from his back and was taken back to OR on 02/15 for I and D with placement of VAC by Dr. Rolena Infante. Delirium has resolved and Risperdal being weaned off. Therapy ongoing and patient limited by RLE weakness and poor safety awareness. [name] recommended for follow up therapy.    Hospital Course: [name] was admitted to rehab 11/16/2016 for inpatient therapies to consist of PT, ST and OT at least three hours five days a week. Past admission physiatrist, therapy team and rehab RN have worked together to provide customized collaborative inpatient rehab. Blood pressures have been stable and he has been afebrile during his stay. Follow up labs revealed lytes to be stable and LTS are slowly resolving. CBC reveled H/H is stable with persistent thrombocytopenia. No signs of bleeding noted. He continues to have blood tinged drainage from back and VAC was changed without difficulty on 2/21. Sutures from right thigh were removed on 2/20 and incision is intact. He continues to have min to moderate amount of serous drainage from incision. Dr. Veverly Fells was contacted for input and office is to set follow up appointment after discharge.  As patient to be discharged to home,  PICC line was removed and antibiotics were changed to Augmentin with input from ID. His pain control has improved and he is to wean off Risperdal over next few days. He has made great progress and required brief length of stay. Speech therapy evaluation revealed  cognition to be intact with MoCA score 29/30. He required supervision to min assist with ADL task and supervision with mobility. He has made great progress during his rehab stay and is modified independent at discharge. He will continue to receive follow up HHPT and [place] [facility] after discharge.     Disposition: 01-Home or Self Care  Diet: Regular.   Special Instructions: 1. HHRN to change VAC on MWF. 2. Has  follow up appointment on Monday for VAC change with Dr. Rolena Infante.  3. HHRN to draw weekly BMET, CBC, ESR and CRP weekly with results to infectious disease. 4. Apply brace when at EOB.     Allergies as of 11/19/2016   No Known Allergies     Medication List    STOP taking these medications   cefTRIAXone 2 g in dextrose 5 % 50 mL   ondansetron 4 MG tablet Commonly known as:  ZOFRAN     TAKE these medications   amoxicillin-clavulanate 875-125 MG tablet Commonly known as:  AUGMENTIN Take 1 tablet by mouth 2 (two) times daily. Start taking on:  8/56/3149   folic acid 1 MG tablet Commonly known as:  FOLVITE Take 1 tablet (1 mg total) by mouth daily.   lactulose 10 GM/15ML solution Commonly known as:  CHRONULAC Take 45 mLs (30 g total) by mouth 2 (two) times daily.   methocarbamol 500 MG tablet Commonly known as:  ROBAXIN Take 1 tablet (500 mg total) by mouth every 6 (six) hours as needed for muscle spasms.   multivitamin with minerals Tabs tablet Take 1 tablet by mouth daily.   oxyCODONE 5 MG immediate release tablet--Rx # 20 pills Commonly known as:  Oxy IR/ROXICODONE Take 1 tablet (5 mg total) by mouth every 12 (twelve) hours as needed for severe pain. What changed:  medication strength  when to take this   pantoprazole sodium 40 mg/20 mL Pack Commonly known as:  PROTONIX Take 20 mLs (40 mg total) by mouth daily. Start taking on:  11/20/2016   protein supplement shake Liqd Commonly known as:  PREMIER PROTEIN Take 325 mLs (11 oz total) by mouth 2 (two) times daily between meals.   risperiDONE 0.25 MG tablet Commonly known as:  RISPERDAL Take 1 tablet (0.25 mg total) by mouth daily. What changed:  medication strength   thiamine 100 MG tablet Take 1 tablet (100 mg total) by mouth daily.      Follow-up Information    [name], MD Follow up.   Specialty:  Physical Medicine and Rehabilitation Why:  call as needed Contact information: [place] [address] [phone]        Dahlia Bailiff, MD. Call on 11/23/2016.   Specialty:  Orthopedic Surgery Why:  Be there at 2:45 pm for follow up appointment on back and for VAC change.  Contact information: [address] [place] [address] [phone]        Olean Ree, MD. Call in 1 day(s).   Specialty:  Internal Medicine Why:  for follow up on liver failure/hepatitis management.  Contact information: [address] [place] [address] [phone]        Augustin Schooling, MD. Call in 1 day(s).   Specialty:  Orthopedic Surgery Why:  for follow up on right thigh Contact information: [address] [place] [address] [phone]        Cornelius Van Dam, MD Follow up.   Specialty:  Infectious Diseases Contact information: [address] [place] [address] [phone]        Nicholes Rough, PA-C Follow up on 11/27/2016.   Specialty:  Physician Assistant Why:  Appointment @ 11:15 AM Contact information: [place] [address] [phone]           Signed: Bary Leriche 11/19/2016, 6:22 PM

## 2016-11-19 NOTE — Progress Notes (Signed)
Subjective/Complaints: Pt states either mom or brother will be picking him up today ROS  No breathing issues no bowel or bladder issues  Objective: Vital Signs: Blood pressure 126/70, pulse 70, temperature 98.7 F (37.1 C), temperature source Oral, resp. rate 18, height '5\' 11"'  (1.803 m), weight 106.2 kg (234 lb 2.1 oz), SpO2 99 %. No results found. Results for orders placed or performed during the hospital encounter of 11/16/16 (from the past 72 hour(s))  CBC WITH DIFFERENTIAL     Status: Abnormal   Collection Time: 11/17/16  5:51 AM  Result Value Ref Range   WBC 6.7 4.0 - 10.5 K/uL   RBC 2.81 (L) 4.22 - 5.81 MIL/uL   Hemoglobin 9.5 (L) 13.0 - 17.0 g/dL   HCT 29.4 (L) 39.0 - 52.0 %   MCV 104.6 (H) 78.0 - 100.0 fL   MCH 33.8 26.0 - 34.0 pg   MCHC 32.3 30.0 - 36.0 g/dL   RDW 15.5 11.5 - 15.5 %   Platelets 75 (L) 150 - 400 K/uL    Comment: CONSISTENT WITH PREVIOUS RESULT   Neutrophils Relative % 41 %   Lymphocytes Relative 39 %   Monocytes Relative 12 %   Eosinophils Relative 4 %   Basophils Relative 4 %   Neutro Abs 2.7 1.7 - 7.7 K/uL   Lymphs Abs 2.6 0.7 - 4.0 K/uL   Monocytes Absolute 0.8 0.1 - 1.0 K/uL   Eosinophils Absolute 0.3 0.0 - 0.7 K/uL   Basophils Absolute 0.3 (H) 0.0 - 0.1 K/uL   RBC Morphology POLYCHROMASIA PRESENT     Comment: TARGET CELLS  Comprehensive metabolic panel     Status: Abnormal   Collection Time: 11/17/16  5:51 AM  Result Value Ref Range   Sodium 137 135 - 145 mmol/L   Potassium 3.7 3.5 - 5.1 mmol/L   Chloride 106 101 - 111 mmol/L   CO2 27 22 - 32 mmol/L   Glucose, Bld 94 65 - 99 mg/dL   BUN 5 (L) 6 - 20 mg/dL   Creatinine, Ser 0.57 (L) 0.61 - 1.24 mg/dL   Calcium 8.2 (L) 8.9 - 10.3 mg/dL   Total Protein 5.5 (L) 6.5 - 8.1 g/dL   Albumin 1.8 (L) 3.5 - 5.0 g/dL   AST 69 (H) 15 - 41 U/L   ALT 39 17 - 63 U/L   Alkaline Phosphatase 73 38 - 126 U/L   Total Bilirubin 3.9 (H) 0.3 - 1.2 mg/dL   GFR calc non Af Amer >60 >60 mL/min   GFR calc Af  Amer >60 >60 mL/min    Comment: (NOTE) The eGFR has been calculated using the CKD EPI equation. This calculation has not been validated in all clinical situations. eGFR's persistently <60 mL/min signify possible Chronic Kidney Disease.    Anion gap 4 (L) 5 - 15     HEENT: normal Cardio: RRR and no murmur Resp: CTA B/L and unlabored GI: BS positive and NT, ND Extremity:  Pulses positive and No Edema Skin:   Other Lumbar incision wound vac without surrounding erythema Neuro: Alert/Oriented, Normal Sensory and Abnormal Motor 3- RIght HF, KE, ADF, 4/5 on left , 5/5 in BUE Musc/Skel:  Other left post thigh wound is dressed Gen NAD   Assessment/Plan: 1. Functional deficits secondary to RLE weakness due to Right thigh and epidural abscess  Stable for D/C today F/u PCP in 3-4 weeks F/u ID ~ 2 weeks F/u ortho per Dr Rolena Infante and Veverly Fells No PMR f/u needed  See D/C summary See D/C instructions FIM: Function - Bathing Position: Sitting EOB (per pt report) Body parts bathed by patient: Right arm, Left arm, Chest, Abdomen, Front perineal area, Buttocks, Right upper leg, Left upper leg, Right lower leg, Left lower leg, Back Bathing not applicable: Right lower leg, Left lower leg, Back Assist Level: More than reasonable time  Function- Upper Body Dressing/Undressing What is the patient wearing?: Pull over shirt/dress Pull over shirt/dress - Perfomed by patient: Thread/unthread right sleeve, Thread/unthread left sleeve, Put head through opening, Pull shirt over trunk Orthosis activity level: Performed by patient Assist Level: More than reasonable time Function - Lower Body Dressing/Undressing What is the patient wearing?: Underwear, Pants, Non-skid slipper socks Position: Sitting EOB Underwear - Performed by patient: Thread/unthread right underwear leg, Thread/unthread left underwear leg, Pull underwear up/down Pants- Performed by patient: Thread/unthread right pants leg, Thread/unthread  left pants leg, Pull pants up/down Non-skid slipper socks- Performed by patient: Don/doff right sock, Don/doff left sock Assist for footwear: Independent Assist for lower body dressing: Assistive device, More than reasonable time  Function - Toileting Toileting steps completed by patient: Adjust clothing prior to toileting, Performs perineal hygiene, Adjust clothing after toileting Toileting Assistive Devices: Grab bar or rail, Other (comment) Assist level: More than reasonable time  Function - Air cabin crew transfer assistive device: Grab bar Assist level to toilet: No Help, no cues, assistive device, takes more than a reasonable amount of time Assist level from toilet: Supervision or verbal cues  Function - Chair/bed transfer Chair/bed transfer method: Stand pivot Chair/bed transfer assist level: No Help, no cues, assistive device, takes more than a reasonable amount of time Chair/bed transfer assistive device: Orthosis  Function - Locomotion: Wheelchair Will patient use wheelchair at discharge?: No Wheelchair activity did not occur: N/A Wheel 50 feet with 2 turns activity did not occur: N/A Wheel 150 feet activity did not occur: N/A Function - Locomotion: Ambulation Assistive device: Orthosis (Back brace ) Max distance: 256f  Assist level: No help, No cues, assistive device, takes more than a reasonable amount of time Assist level: No help, No cues, assistive device, takes more than a reasonable amount of time Assist level: No help, No cues, assistive device, takes more than a reasonable amount of time Assist level: No help, No cues, assistive device, takes more than a reasonable amount of time Assist level: Supervision or verbal cues  Function - Comprehension Comprehension: Auditory Comprehension assist level: Follows complex conversation/direction with no assist  Function - Expression Expression: Verbal Expression assist level: Expresses complex ideas: With no  assist  Function - Social Interaction Social Interaction assist level: Interacts appropriately with others - No medications needed.  Function - Problem Solving Problem solving assist level: Solves complex problems: Recognizes & self-corrects  Function - Memory Memory assist level: More than reasonable amount of time Patient normally able to recall (first 3 days only): Current season, Location of own room, Staff names and faces, That he or she is in a hospital  Medical Problem List and Plan: 1.  Requires some assistance for mobility and self-care secondary to epidural abscess. D/C home today 2.  DVT Prophylaxis/Anticoagulation: Mechanical: Sequential compression devices, below knee Bilateral lower extremities 3. Pain Management: discontinue morphine. Continue oxycodone prn  4. Mood: LCSW to follow for evaluation and support. Will need outpt substance abuse eval 5. Neuropsych: This patient is not fully capable of making decisions on his own behalf. 6. Skin/Wound Care:  Monitor right thigh wound --on protein supplement to help  promote wound healing. 7. Fluids/Electrolytes/Nutrition: Check I/O. Offer supplements between meals if intake poor. Check lytes in am. 8. 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 Plans for surgery next week? 9. Right thigh abscess/myositis change to Augmentin for d/c) - 10. Acute liver failure with hepatitis with jaundice: Jaundice resolved with resolving LFTs.total bili still elevated  Off Xifaximin since 2/17.  Continue lactulose bid.   11. Thrombocytopenia with coagulopathy:  Serial check of CBC--will monitor for signs of bleeding. PLT 75K- vs 88K on 2/18  12. Delirium Tremens/alcohol abuse: On Risperdal prn   LOS (Days) 3 A FACE TO FACE EVALUATION WAS PERFORMED  [name] E 11/19/2016, 8:07 AM

## 2016-11-19 NOTE — Progress Notes (Signed)
Social Work  Discharge Note  The overall goal for the admission was met for:   Discharge location: Yes-HOME WITH GIRLFRIEND WITH INTERMITTENT ASSIST FROM MOM, BROTHER AND GIRLFRIEND  Length of Stay: Yes-3 DAYS  Discharge activity level: Yes-MOD/I-INDEPENDENT LEVEL  Home/community participation: Yes  Services provided included: MD, RD, PT, OT, SLP, RN, CM, Pharmacy and SW  Financial Services: Private Insurance: [employer]  Follow-up services arranged: Home Health: [place] and Patient/Family has no preference for HH/DME agencies  Comments (or additional information):PT REACHED GOALS BY DAY 2 READY TO [place]. HHRN SET FOR FOR WOUND VAC AND DRESSING CHANGES. PT DECLINED SUBSTANCE ABUSE RESOURCES, FELT NOT NEEDED.  Patient/Family verbalized understanding of follow-up arrangements: Yes  Individual responsible for coordination of the follow-up plan: SELF & HOPE-GIRLFRIEND  Confirmed correct DME delivered: Elease Hashimoto 11/19/2016    Elease Hashimoto

## 2016-12-01 ENCOUNTER — Encounter (HOSPITAL_COMMUNITY): Payer: Self-pay | Admitting: *Deleted

## 2016-12-01 ENCOUNTER — Ambulatory Visit: Payer: Self-pay | Admitting: Physician Assistant

## 2016-12-01 NOTE — Progress Notes (Signed)
I spoke with Willeen Cass, NP regarding patient's liver disease and last hospitalization.  Angela ordered Pt, PTT to be done day of surgery.

## 2016-12-02 ENCOUNTER — Encounter (HOSPITAL_COMMUNITY)
Admission: RE | Disposition: A | Discharge status: Home / Self Care | Payer: Self-pay | Source: Ambulatory Visit | Attending: Orthopedic Surgery

## 2016-12-02 ENCOUNTER — Ambulatory Visit (HOSPITAL_COMMUNITY): Payer: BLUE CROSS/BLUE SHIELD | Admitting: Emergency Medicine

## 2016-12-02 ENCOUNTER — Ambulatory Visit (HOSPITAL_COMMUNITY)
Admission: RE | Admit: 2016-12-02 | Discharge: 2016-12-02 | Disposition: A | Discharge status: Home / Self Care | Payer: BLUE CROSS/BLUE SHIELD | Source: Ambulatory Visit | Attending: Orthopedic Surgery | Admitting: Orthopedic Surgery

## 2016-12-02 ENCOUNTER — Encounter (HOSPITAL_COMMUNITY): Payer: Self-pay | Admitting: Anesthesiology

## 2016-12-02 DIAGNOSIS — L089 Local infection of the skin and subcutaneous tissue, unspecified: Secondary | ICD-10-CM | POA: Diagnosis not present

## 2016-12-02 DIAGNOSIS — M5416 Radiculopathy, lumbar region: Secondary | ICD-10-CM | POA: Diagnosis not present

## 2016-12-02 DIAGNOSIS — L02415 Cutaneous abscess of right lower limb: Secondary | ICD-10-CM | POA: Diagnosis not present

## 2016-12-02 DIAGNOSIS — T814XXD Infection following a procedure, subsequent encounter: Secondary | ICD-10-CM | POA: Diagnosis not present

## 2016-12-02 DIAGNOSIS — Z809 Family history of malignant neoplasm, unspecified: Secondary | ICD-10-CM | POA: Insufficient documentation

## 2016-12-02 DIAGNOSIS — Z8249 Family history of ischemic heart disease and other diseases of the circulatory system: Secondary | ICD-10-CM | POA: Diagnosis not present

## 2016-12-02 DIAGNOSIS — X58XXXD Exposure to other specified factors, subsequent encounter: Secondary | ICD-10-CM | POA: Insufficient documentation

## 2016-12-02 DIAGNOSIS — Z833 Family history of diabetes mellitus: Secondary | ICD-10-CM | POA: Diagnosis not present

## 2016-12-02 DIAGNOSIS — Z79899 Other long term (current) drug therapy: Secondary | ICD-10-CM | POA: Diagnosis not present

## 2016-12-02 DIAGNOSIS — F17200 Nicotine dependence, unspecified, uncomplicated: Secondary | ICD-10-CM | POA: Diagnosis not present

## 2016-12-02 HISTORY — DX: Alcohol abuse, uncomplicated: F10.10

## 2016-12-02 HISTORY — DX: Osteomyelitis of vertebra, site unspecified: M46.20

## 2016-12-02 HISTORY — DX: Cellulitis, unspecified: L03.90

## 2016-12-02 HISTORY — DX: Alcoholic cirrhosis of liver without ascites: K70.30

## 2016-12-02 HISTORY — DX: Intraspinal abscess and granuloma: G06.1

## 2016-12-02 HISTORY — DX: Thrombocytopenia, unspecified: D69.6

## 2016-12-02 HISTORY — PX: INCISION AND DRAINAGE OF WOUND: SHX1803

## 2016-12-02 LAB — CBC
HEMATOCRIT: 38.6 % — AB (ref 39.0–52.0)
HEMOGLOBIN: 12.5 g/dL — AB (ref 13.0–17.0)
MCH: 32 pg (ref 26.0–34.0)
MCHC: 32.4 g/dL (ref 30.0–36.0)
MCV: 98.7 fL (ref 78.0–100.0)
Platelets: UNDETERMINED 10*3/uL (ref 150–400)
RBC: 3.91 MIL/uL — AB (ref 4.22–5.81)
RDW: 13.9 % (ref 11.5–15.5)
WBC: 6.1 10*3/uL (ref 4.0–10.5)

## 2016-12-02 LAB — COMPREHENSIVE METABOLIC PANEL
ALK PHOS: 62 U/L (ref 38–126)
ALT: 25 U/L (ref 17–63)
AST: 58 U/L — AB (ref 15–41)
Albumin: 2.2 g/dL — ABNORMAL LOW (ref 3.5–5.0)
Anion gap: 4 — ABNORMAL LOW (ref 5–15)
BILIRUBIN TOTAL: 2.6 mg/dL — AB (ref 0.3–1.2)
BUN: 7 mg/dL (ref 6–20)
CALCIUM: 8.5 mg/dL — AB (ref 8.9–10.3)
CO2: 27 mmol/L (ref 22–32)
Chloride: 107 mmol/L (ref 101–111)
Creatinine, Ser: 0.59 mg/dL — ABNORMAL LOW (ref 0.61–1.24)
GFR calc Af Amer: >60 mL/min (ref >60)
GFR calc non Af Amer: >60 mL/min (ref >60)
GLUCOSE: 84 mg/dL (ref 65–99)
POTASSIUM: 4 mmol/L (ref 3.5–5.1)
Sodium: 138 mmol/L (ref 135–145)
TOTAL PROTEIN: 6.1 g/dL — AB (ref 6.5–8.1)

## 2016-12-02 LAB — PROTIME-INR
INR: 1.48
PROTHROMBIN TIME: 18 s — AB (ref 11.4–15.2)

## 2016-12-02 LAB — APTT: aPTT: 39 seconds — ABNORMAL HIGH (ref 24–36)

## 2016-12-02 SURGERY — IRRIGATION AND DEBRIDEMENT WOUND
Anesthesia: General

## 2016-12-02 MED ORDER — LACTATED RINGERS IV SOLN
Freq: Once | INTRAVENOUS | Status: AC
  Administered 2016-12-02: 15:00:00 via INTRAVENOUS

## 2016-12-02 MED ORDER — VANCOMYCIN HCL 1000 MG IV SOLR
INTRAVENOUS | Status: AC
  Filled 2016-12-02: qty 1000

## 2016-12-02 MED ORDER — FENTANYL CITRATE (PF) 100 MCG/2ML IJ SOLN
INTRAMUSCULAR | Status: AC
  Filled 2016-12-02: qty 2

## 2016-12-02 MED ORDER — OXYCODONE HCL 5 MG PO TABS: 5.0000 mg | ORAL_TABLET | Freq: Once | ORAL | Status: DC | PRN

## 2016-12-02 MED ORDER — CEFAZOLIN SODIUM-DEXTROSE 2-4 GM/100ML-% IV SOLN
INTRAVENOUS | Status: AC
  Filled 2016-12-02: qty 100

## 2016-12-02 MED ORDER — FENTANYL CITRATE (PF) 100 MCG/2ML IJ SOLN
INTRAMUSCULAR | Status: AC
  Administered 2016-12-02: 50 ug via INTRAVENOUS
  Filled 2016-12-02: qty 2

## 2016-12-02 MED ORDER — ROCURONIUM BROMIDE 100 MG/10ML IV SOLN
INTRAVENOUS | Status: DC | PRN
  Administered 2016-12-02: 30 mg via INTRAVENOUS

## 2016-12-02 MED ORDER — ONDANSETRON HCL 4 MG/2ML IJ SOLN: 4.0000 mg | Freq: Once | INTRAMUSCULAR | Status: DC | PRN

## 2016-12-02 MED ORDER — MIDAZOLAM HCL 2 MG/2ML IJ SOLN
INTRAMUSCULAR | Status: AC
  Filled 2016-12-02: qty 2

## 2016-12-02 MED ORDER — FENTANYL CITRATE (PF) 100 MCG/2ML IJ SOLN
25.0000 ug | INTRAMUSCULAR | Status: DC | PRN
  Administered 2016-12-02 (×2): 50 ug via INTRAVENOUS

## 2016-12-02 MED ORDER — SUGAMMADEX SODIUM 200 MG/2ML IV SOLN
INTRAVENOUS | Status: DC | PRN
  Administered 2016-12-02: 200 mg via INTRAVENOUS

## 2016-12-02 MED ORDER — PROPOFOL 10 MG/ML IV BOLUS
INTRAVENOUS | Status: AC
  Filled 2016-12-02: qty 20

## 2016-12-02 MED ORDER — CEFAZOLIN SODIUM-DEXTROSE 2-4 GM/100ML-% IV SOLN
2.0000 g | INTRAVENOUS | Status: AC
  Administered 2016-12-02: 2 g via INTRAVENOUS

## 2016-12-02 MED ORDER — MIDAZOLAM HCL 5 MG/5ML IJ SOLN
INTRAMUSCULAR | Status: DC | PRN
  Administered 2016-12-02: 2 mg via INTRAVENOUS

## 2016-12-02 MED ORDER — BACITRACIN-NEOMYCIN-POLYMYXIN 400-5-5000 EX OINT
TOPICAL_OINTMENT | CUTANEOUS | Status: AC
  Filled 2016-12-02: qty 1

## 2016-12-02 MED ORDER — SUCCINYLCHOLINE CHLORIDE 20 MG/ML IJ SOLN
INTRAMUSCULAR | Status: DC | PRN
  Administered 2016-12-02: 140 mg via INTRAVENOUS

## 2016-12-02 MED ORDER — OXYCODONE HCL 5 MG/5ML PO SOLN: 5.0000 mg | Freq: Once | ORAL | Status: DC | PRN

## 2016-12-02 MED ORDER — LIDOCAINE HCL (CARDIAC) 20 MG/ML IV SOLN
INTRAVENOUS | Status: DC | PRN
  Administered 2016-12-02: 100 mg via INTRAVENOUS

## 2016-12-02 MED ORDER — LACTATED RINGERS IV SOLN
INTRAVENOUS | Status: DC | PRN
  Administered 2016-12-02: 16:00:00 via INTRAVENOUS

## 2016-12-02 MED ORDER — VANCOMYCIN HCL 1000 MG IV SOLR
INTRAVENOUS | Status: DC | PRN
  Administered 2016-12-02: 1000 mg

## 2016-12-02 MED ORDER — ONDANSETRON HCL 4 MG/2ML IJ SOLN
INTRAMUSCULAR | Status: DC | PRN
  Administered 2016-12-02: 4 mg via INTRAVENOUS

## 2016-12-02 MED ORDER — SODIUM CHLORIDE 0.9 % IR SOLN
Status: DC | PRN
  Administered 2016-12-02: 3000 mL

## 2016-12-02 MED ORDER — FENTANYL CITRATE (PF) 100 MCG/2ML IJ SOLN
INTRAMUSCULAR | Status: DC | PRN
  Administered 2016-12-02 (×2): 100 ug via INTRAVENOUS

## 2016-12-02 MED ORDER — PROPOFOL 10 MG/ML IV BOLUS
INTRAVENOUS | Status: DC | PRN
  Administered 2016-12-02: 50 mg via INTRAVENOUS
  Administered 2016-12-02: 200 mg via INTRAVENOUS

## 2016-12-02 SURGICAL SUPPLY — 65 items
BLADE CLIPPER SURG (BLADE) IMPLANT
BNDG GAUZE ELAST 4 BULKY (GAUZE/BANDAGES/DRESSINGS) ×4 IMPLANT
BUR EGG ELITE 4.0 (BURR) IMPLANT
BUR EGG ELITE 4.0MM (BURR)
CANISTER SUCT 3000ML PPV (MISCELLANEOUS) ×1 IMPLANT
CLOSURE STERI-STRIP 1/2X4 (GAUZE/BANDAGES/DRESSINGS)
CLSR STERI-STRIP ANTIMIC 1/2X4 (GAUZE/BANDAGES/DRESSINGS) ×1 IMPLANT
COVER SURGICAL LIGHT HANDLE (MISCELLANEOUS) ×3 IMPLANT
DRAPE C-ARM 42X72 X-RAY (DRAPES) ×1 IMPLANT
DRAPE C-ARMOR (DRAPES) ×1 IMPLANT
DRAPE POUCH INSTRU U-SHP 10X18 (DRAPES) ×3 IMPLANT
DRAPE SURG 17X23 STRL (DRAPES) ×3 IMPLANT
DRAPE U-SHAPE 47X51 STRL (DRAPES) ×3 IMPLANT
DRSG ADAPTIC 3X8 NADH LF (GAUZE/BANDAGES/DRESSINGS) ×2 IMPLANT
DRSG AQUACEL AG ADV 3.5X10 (GAUZE/BANDAGES/DRESSINGS) ×3 IMPLANT
DRSG PAD ABDOMINAL 8X10 ST (GAUZE/BANDAGES/DRESSINGS) ×4 IMPLANT
DURAPREP 26ML APPLICATOR (WOUND CARE) ×3 IMPLANT
ELECT BLADE 4.0 EZ CLEAN MEGAD (MISCELLANEOUS)
ELECT BLADE 6.5 EXT (BLADE) IMPLANT
ELECT PENCIL ROCKER SW 15FT (MISCELLANEOUS) ×1 IMPLANT
ELECT REM PT RETURN 9FT ADLT (ELECTROSURGICAL) ×3
ELECTRODE BLDE 4.0 EZ CLN MEGD (MISCELLANEOUS) ×1 IMPLANT
ELECTRODE REM PT RTRN 9FT ADLT (ELECTROSURGICAL) ×1 IMPLANT
GAUZE SPONGE 4X4 12PLY STRL (GAUZE/BANDAGES/DRESSINGS) ×2 IMPLANT
GLOVE BIOGEL PI IND STRL 8.5 (GLOVE) ×1 IMPLANT
GLOVE BIOGEL PI INDICATOR 8.5 (GLOVE) ×2
GLOVE SS BIOGEL STRL SZ 8.5 (GLOVE) ×1 IMPLANT
GLOVE SUPERSENSE BIOGEL SZ 8.5 (GLOVE) ×2
GOWN STRL REUS W/ TWL LRG LVL3 (GOWN DISPOSABLE) ×1 IMPLANT
GOWN STRL REUS W/TWL 2XL LVL3 (GOWN DISPOSABLE) ×4 IMPLANT
GOWN STRL REUS W/TWL LRG LVL3 (GOWN DISPOSABLE) ×3
KIT BASIN OR (CUSTOM PROCEDURE TRAY) ×3 IMPLANT
KIT POSITION SURG JACKSON T1 (MISCELLANEOUS) IMPLANT
KIT ROOM TURNOVER OR (KITS) ×3 IMPLANT
KIT STIMULAN RAPID CURE 5CC (Orthopedic Implant) ×2 IMPLANT
MANIFOLD NEPTUNE II (INSTRUMENTS) ×2 IMPLANT
NDL SPNL 18GX3.5 QUINCKE PK (NEEDLE) ×2 IMPLANT
NEEDLE 22X1 1/2 (OR ONLY) (NEEDLE) ×3 IMPLANT
NEEDLE SPNL 18GX3.5 QUINCKE PK (NEEDLE) ×6 IMPLANT
NS IRRIG 1000ML POUR BTL (IV SOLUTION) ×3 IMPLANT
PACK LAMINECTOMY ORTHO (CUSTOM PROCEDURE TRAY) ×3 IMPLANT
PACK UNIVERSAL I (CUSTOM PROCEDURE TRAY) ×3 IMPLANT
PAD ARMBOARD 7.5X6 YLW CONV (MISCELLANEOUS) ×6 IMPLANT
PATTIES SURGICAL .5 X.5 (GAUZE/BANDAGES/DRESSINGS) IMPLANT
PATTIES SURGICAL .5 X1 (DISPOSABLE) ×3 IMPLANT
POSITIONER HEAD PRONE TRACH (MISCELLANEOUS) ×3 IMPLANT
SPONGE LAP 4X18 X RAY DECT (DISPOSABLE) ×2 IMPLANT
SPONGE SURGIFOAM ABS GEL 100 (HEMOSTASIS) ×1 IMPLANT
SURGIFLO W/THROMBIN 8M KIT (HEMOSTASIS) IMPLANT
SUT BONE WAX W31G (SUTURE) ×3 IMPLANT
SUT MNCRL AB 3-0 PS2 18 (SUTURE) ×6 IMPLANT
SUT PDS AB 1 TP1 54 (SUTURE) ×2 IMPLANT
SUT PROLENE 1 CT (SUTURE) ×4 IMPLANT
SUT PROLENE 3 0 CT 1 (SUTURE) ×2 IMPLANT
SUT VIC AB 1 CT1 18XCR BRD 8 (SUTURE) ×1 IMPLANT
SUT VIC AB 1 CT1 8-18 (SUTURE) ×3
SUT VIC AB 2-0 CT1 18 (SUTURE) ×3 IMPLANT
SYR BULB IRRIGATION 50ML (SYRINGE) ×3 IMPLANT
SYR CONTROL 10ML LL (SYRINGE) ×3 IMPLANT
SYSTEM CHEST DRAIN TLS 7FR (DRAIN) ×2 IMPLANT
TOWEL OR 17X24 6PK STRL BLUE (TOWEL DISPOSABLE) ×3 IMPLANT
TOWEL OR 17X26 10 PK STRL BLUE (TOWEL DISPOSABLE) ×3 IMPLANT
TRAY FOLEY W/METER SILVER 16FR (SET/KITS/TRAYS/PACK) ×3 IMPLANT
WATER STERILE IRR 1000ML POUR (IV SOLUTION) ×3 IMPLANT
YANKAUER SUCT BULB TIP NO VENT (SUCTIONS) ×3 IMPLANT

## 2016-12-02 NOTE — Transfer of Care (Signed)
Immediate Anesthesia Transfer of Care Note  Patient: [name]  Procedure(s) Performed: Procedure(s): WASHOUT AND CLOSURE OF WOUND (N/A)  Patient Location: PACU  Anesthesia Type:General  Level of Consciousness: awake  Airway & Oxygen Therapy: Patient Spontanous Breathing  Post-op Assessment: Report given to RN and Post -op Vital signs reviewed and stable  Post vital signs: Reviewed and stable  Last Vitals:  Vitals:   12/02/16 1405  BP: (!) 123/57  Pulse: 62  Resp: 18  Temp: 36.7 C    Last Pain:  Vitals:   12/02/16 1423  TempSrc:   PainSc: 7          Complications: No apparent anesthesia complications

## 2016-12-02 NOTE — Anesthesia Postprocedure Evaluation (Addendum)
Anesthesia Post Note  Patient: [name]  Procedure(s) Performed: Procedure(s) (LRB): WASHOUT AND CLOSURE OF WOUND (N/A)  Patient location during evaluation: PACU Anesthesia Type: General Level of consciousness: awake, awake and alert and oriented Pain management: pain level controlled Respiratory status: spontaneous breathing, nonlabored ventilation and respiratory function stable Cardiovascular status: blood pressure returned to baseline Anesthetic complications: no       Last Vitals:  Vitals:   12/02/16 1656 12/02/16 1706  BP:  125/64  Pulse:  92  Resp:  (!) 22  Temp: 36.5 C     Last Pain:  Vitals:   12/02/16 1656  TempSrc:   PainSc: 8                  Zacheriah Stumpe COKER

## 2016-12-02 NOTE — Progress Notes (Signed)
Lab called, CMET sample hemolyzed, needs to be re-drawn.

## 2016-12-02 NOTE — H&P (Signed)
History of Present Illness  The patient is a 36 year old male presenting for a post-operative visit. Patient is 4 weeks postop following right posterior thigh abcess I&D (11/01/2016).Overall the patient feels that they are doing fair. Post operative pain has been mild and moderate. The patient does report pain, tenderness and pain with range of motion The patient does indicate that these symptoms are gradually improving. Pain medications include: Oxycodone 5-325mg  . Note for "Post op": Patient is currently taking Augmentin.    Subjective Transcription [name] returns today for followup. He has had no fevers or chills. No significant weight loss. His jaundice color is completely resolved. His back pain is actually improving and he is tolerating the brace quite well. He is neurologically intact. He is able to don the brace by himself without great difficulty. The wound is clean, dry, and intact. There is no drainage. The wound edges appear nice. There is no foul odor.  My plan is to take it back to the operating room today to close the wound. He will be able to be discharged the same day, then he will have a full week supply of pain medication. I am also going to set up his appointment with infectious disease. He continues to be on the antibiotics and I would like him seen, so that they can manage that regimen in terms of duration.  The patient's index decompression was on February 4th and then he had a repeat washout with vac dressing application on 24/82/5003 that puts this about four weeks out from his initial washout. At this point in time, I do think the wound is healing well, and so I am going to close it today. I do not think he needs to spend the night in the hospital. I will defer the thigh wound management to Dr. Veverly Fells.    Problem List/Past Medical Edema, leg (R60.0)  Lumbar radiculopathy (M54.16)  Abscess of right thigh (L02.415)  Other orthopedic aftercare (Z47.89)  Sprain/strain,  knee/leg NOS (844.9) [05/22/2010]: (Marked as Inactive) Problems Reconciled    Allergies No Known Drug Allergies [10/21/2016]: Allergies Reconciled   Family History Cancer  Maternal Grandmother. Diabetes Mellitus  Mother. Hypertension  Brother.  Social History  Tobacco / smoke exposure  10/21/2016: yes outdoors only Tobacco use  Current some day smoker. 10/21/2016: smoke(d) less than 1/2 pack(s) per day Children  1 Not under pain contract  Current drinker  10/21/2016: Currently drinks beer, wine and hard liquor 5-7 times per week Current work status  working full time Exercise  Exercises weekly; does running / walking and gym / [employer] Living situation  live with partner Marital status  partnered No history of drug/alcohol rehab  Number of flights of stairs before winded  greater than 5  Medication History Percocet (5-325MG  Tablet, 1-2 Tablet Oral every 4-6 hours as needed for pain, Taken starting 11/25/2016) Active. Norco (10-325MG  Tablet, 1 (one) Tablet Oral three times daily, as needed, Taken starting 10/21/2016) Active. Cyclobenzaprine HCl (10MG  Tablet, Oral) Active. Naproxen (500MG  Tablet, Oral) Active. Medications Reconciled   Assessment & Plan ) Wound closure  All risks reviewed - infection, need for additional surgery, on going or worse pain, need for additional surgery

## 2016-12-02 NOTE — Anesthesia Procedure Notes (Signed)
Procedure Name: Intubation Date/Time: 12/02/2016 3:58 PM Performed by: Manus Gunning, Anihya Tuma J Pre-anesthesia Checklist: Patient identified, Emergency Drugs available, Suction available, Patient being monitored and Timeout performed Patient Re-evaluated:Patient Re-evaluated prior to inductionOxygen Delivery Method: Circle system utilized Preoxygenation: Pre-oxygenation with 100% oxygen Intubation Type: IV induction Ventilation: Mask ventilation without difficulty Laryngoscope Size: Mac and 4 Grade View: Grade I Tube type: Oral Tube size: 7.5 mm Number of attempts: 1 Placement Confirmation: ETT inserted through vocal cords under direct vision,  positive ETCO2 and breath sounds checked- equal and bilateral Secured at: 21 cm Tube secured with: Tape Dental Injury: Teeth and Oropharynx as per pre-operative assessment

## 2016-12-02 NOTE — Brief Op Note (Signed)
12/02/2016  4:53 PM  PATIENT:  [name]  36 y.o. male  PRE-OPERATIVE DIAGNOSIS:  WOUND INFECTION  POST-OPERATIVE DIAGNOSIS:  WOUND INFECTION  PROCEDURE:  Procedure(s): WASHOUT AND CLOSURE OF WOUND (N/A)  SURGEON:  Surgeon(s) and Role:    * Melina Schools, MD - Primary  PHYSICIAN ASSISTANT:   ASSISTANTS: none   ANESTHESIA:   general  EBL:  Total I/O In: 700 [I.V.:700] Out: 5 [Blood:5]  BLOOD ADMINISTERED:none  DRAINS: none   LOCAL MEDICATIONS USED:  NONE  SPECIMEN:  No Specimen  DISPOSITION OF SPECIMEN:  N/A  COUNTS:  YES  TOURNIQUET:  * No tourniquets in log *  DICTATION: .Other Dictation: Dictation Number [id number]  PLAN OF CARE: Discharge to home after PACU  PATIENT DISPOSITION:  PACU - hemodynamically stable.

## 2016-12-02 NOTE — Anesthesia Preprocedure Evaluation (Signed)
Anesthesia Evaluation  Patient identified by MRN, date of birth, ID band Patient awake    Reviewed: Allergy & Precautions, NPO status , Patient's Chart, lab work & pertinent test results  Airway Mallampati: II  TM Distance: >3 FB     Dental   Pulmonary Current Smoker,    breath sounds clear to auscultation       Cardiovascular  Rhythm:Regular Rate:Normal     Neuro/Psych    GI/Hepatic   Endo/Other    Renal/GU      Musculoskeletal   Abdominal   Peds  Hematology   Anesthesia Other Findings   Reproductive/Obstetrics                             Anesthesia Physical Anesthesia Plan  ASA: III  Anesthesia Plan: General   Post-op Pain Management:    Induction: Intravenous  Airway Management Planned: Oral ETT  Additional Equipment:   Intra-op Plan:   Post-operative Plan: Extubation in OR  Informed Consent: I have reviewed the patients History and Physical, chart, labs and discussed the procedure including the risks, benefits and alternatives for the proposed anesthesia with the patient or authorized representative who has indicated his/her understanding and acceptance.   Dental advisory given  Plan Discussed with: CRNA and Anesthesiologist  Anesthesia Plan Comments:         Anesthesia Quick Evaluation

## 2016-12-03 ENCOUNTER — Encounter (HOSPITAL_COMMUNITY): Payer: Self-pay | Admitting: Orthopedic Surgery

## 2016-12-03 NOTE — Op Note (Signed)
NAME:  [name], [name]             ACCOUNT NO.:  [id number]  MEDICAL RECORD NO.:  [id number]  LOCATION:                                 FACILITY:  PHYSICIAN:  Eretria Manternach D. Rolena Infante, M.D. DATE OF BIRTH:  05/12/1981  DATE OF PROCEDURE:  12/02/2016 DATE OF DISCHARGE:                              OPERATIVE REPORT   PREOPERATIVE DIAGNOSIS:  The patient presented with spine infection, status post I and D x2, and application of wound VAC.  POSTOPERATIVE DIAGNOSIS:  The patient presented with spine infection, status post I and D x2, and application of wound VAC.  OPERATIVE PROCEDURE:  Washout and wound closure.  COMPLICATIONS:  None.  CONDITION:  Stable.  HISTORY:  This is a very pleasant 36 year old gentle with multiple medical issues, who presented to my care with a spine facet infection and abscess.  The patient had an I and D and primary closure over drains.  He subsequently developed wound breakdown and recurrent abscess formation and was taken back to the operating room for multiple washouts and VAC dressings.  He was remained at home on antibiotics with VAC dressing changes.  Currently, the wound itself is healing well.  There are no signs of ongoing infection and so, we elected to take him back to the operating room today for primary wound closure.  All appropriate risks, benefits and alternatives were discussed with the patient and consent was obtained.  OPERATIVE NOTE:  The patient was brought to the operating room and placed supine on the operating table.  After successful induction of general anesthesia and endotracheal intubation, TEDs and SCDs were applied.  He was turned prone onto the Wilson frame and all bony prominences were well padded.  The back was then prepped and draped in a standard fashion and a time-out was taken.  Once the time-out was done, the wound edges were freshened with a 15-blade scalpel and I palpated and debrided until I debrided any nonviable  tissue.  The wound itself was noted to be good with no significant concern for ongoing infection. As a result, I then irrigated the wound out with 3 L of normal saline. I then placed the antibiotic impregnated beads.  I closed with a running vertical mattress suture over a drain.  PLAN:  The patient will be discharged to home once he recovers and follow up with me in 1 week for wound evaluation.  The drain will be removed prior to discharge as long as it is not putting out significant amounts.     Jawara Latorre D. Rolena Infante, M.D.   ______________________________ Blake Divine. Rolena Infante, M.D.    DDB/MEDQ  D:  12/02/2016  T:  12/02/2016  Job:  [id number]

## 2016-12-09 ENCOUNTER — Ambulatory Visit (INDEPENDENT_AMBULATORY_CARE_PROVIDER_SITE_OTHER): Payer: BLUE CROSS/BLUE SHIELD | Admitting: Internal Medicine

## 2016-12-09 ENCOUNTER — Encounter: Payer: Self-pay | Admitting: Internal Medicine

## 2016-12-09 VITALS — BP 116/78 | HR 83 | Temp 97.6°F | Ht 71.0 in | Wt 235.0 lb

## 2016-12-09 DIAGNOSIS — G061 Intraspinal abscess and granuloma: Secondary | ICD-10-CM

## 2016-12-09 LAB — CBC WITH DIFFERENTIAL/PLATELET
BASOS ABS: 213 {cells}/uL — AB (ref 0–200)
BASOS PCT: 3 %
EOS ABS: 426 {cells}/uL (ref 15–500)
EOS PCT: 6 %
HCT: 37.1 % — ABNORMAL LOW (ref 38.5–50.0)
HEMOGLOBIN: 12.6 g/dL — AB (ref 13.2–17.1)
LYMPHS ABS: 2769 {cells}/uL (ref 850–3900)
Lymphocytes Relative: 39 %
MCH: 32.3 pg (ref 27.0–33.0)
MCHC: 34 g/dL (ref 32.0–36.0)
MCV: 95.1 fL (ref 80.0–100.0)
MONOS PCT: 20 %
MPV: 9.8 fL (ref 7.5–12.5)
Monocytes Absolute: 1420 cells/uL — ABNORMAL HIGH (ref 200–950)
NEUTROS ABS: 2272 {cells}/uL (ref 1500–7800)
Neutrophils Relative %: 32 %
PLATELETS: 118 10*3/uL — AB (ref 140–400)
RBC: 3.9 MIL/uL — ABNORMAL LOW (ref 4.20–5.80)
RDW: 14.2 % (ref 11.0–15.0)
WBC: 7.1 10*3/uL (ref 3.8–10.8)

## 2016-12-09 LAB — BASIC METABOLIC PANEL
BUN: 8 mg/dL (ref 7–25)
CHLORIDE: 105 mmol/L (ref 98–110)
CO2: 28 mmol/L (ref 20–31)
CREATININE: 0.55 mg/dL — AB (ref 0.60–1.35)
Calcium: 8.7 mg/dL (ref 8.6–10.3)
Glucose, Bld: 94 mg/dL (ref 65–99)
POTASSIUM: 4 mmol/L (ref 3.5–5.3)
SODIUM: 139 mmol/L (ref 135–146)

## 2016-12-09 NOTE — Progress Notes (Signed)
RFV: folow up for epidural spine infection s/p I x D x 2  Patient ID: [name], male   DOB: 1981/09/11, 36 y.o.   MRN: [id number]  HPI  36yo M with hx of Hep C and alcoholic cirrhosisc/b cirrhosis  who was admitted in early February for worsening back pain and right leg cellultis with streptococcus intermedicus and viridans group streptococcal lumbar right facet and epidural abscess infection and pyomyositis of right leg. HE had I x D of right posterior thigh abscess on 2/4 by DR Veverly Fells and the  He underwent lumbar wound debridement  On 2/4 then repeat with wound vac on 2/15 by Dr Rolena Infante. cx growing viridans group streptococci He was to be treated with 6 wk of IV ceftriaxone that would take him to 3/24. If patient was not amenable to IV abtx, recs were to switch him to amox/clav. He was discharged on 2/22 but on amox/clav. He followed up with DR Rolena Infante on 3/7 where he planned to take him to the OR for wound closure since it was not healing well on its own, roughly 4 wk for index decompression surgery. On 3/7 he had an additional washout and wound closure. He has been on amox/clav since then  He does twice a day dressing changes to his back wound and leg wound   I have reviewed his records in [place] link  Outpatient Encounter Prescriptions as of 12/09/2016  Medication Sig  . amoxicillin-clavulanate (AUGMENTIN) 875-125 MG tablet Take 1 tablet by mouth 2 (two) times daily.  . folic acid (FOLVITE) 1 MG tablet Take 1 tablet (1 mg total) by mouth daily.  Marland Kitchen lactulose (CHRONULAC) 10 GM/15ML solution Take 45 mLs (30 g total) by mouth 2 (two) times daily.  . methocarbamol (ROBAXIN) 500 MG tablet Take 1 tablet (500 mg total) by mouth every 6 (six) hours as needed for muscle spasms.  . Multiple Vitamin (MULTIVITAMIN WITH MINERALS) TABS tablet Take 1 tablet by mouth daily. (Patient not taking: Reported on 12/02/2016)  . ondansetron (ZOFRAN) 4 MG tablet Take 4 mg by mouth every 8 (eight)  hours as needed for nausea or vomiting.  Marland Kitchen oxyCODONE (OXY IR/ROXICODONE) 5 MG immediate release tablet Take 1 tablet (5 mg total) by mouth every 12 (twelve) hours as needed for severe pain.  . pantoprazole (PROTONIX) 40 MG tablet Take 40 mg by mouth daily.  . pantoprazole sodium (PROTONIX) 40 mg/20 mL PACK Take 20 mLs (40 mg total) by mouth daily. (Patient not taking: Reported on 12/02/2016)  . protein supplement shake (PREMIER PROTEIN) LIQD Take 325 mLs (11 oz total) by mouth 2 (two) times daily between meals.  . risperiDONE (RISPERDAL) 0.25 MG tablet Take 1 tablet (0.25 mg total) by mouth daily. (Patient not taking: Reported on 12/02/2016)  . thiamine 100 MG tablet Take 1 tablet (100 mg total) by mouth daily.   No facility-administered encounter medications on file as of 12/09/2016.      Patient Active Problem List   Diagnosis Date Noted  . Weakness of right lower extremity 11/19/2016  . 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 Abnormality of gait   . Post-operative pain   . Infective myositis of right thigh   . Coagulopathy ([place])   . DTs (delirium tremens) ([place])   . Transaminitis   . Infection 11/12/2016  . Alcoholic cirrhosis of liver without ascites ([place])   . Goals of care, counseling/discussion   . Palliative care by specialist   . Pyogenic arthritis of multiple  sites [facility])   . Traumatic compartment syndrome of other sites [facility])   . Alcoholic liver failure ([place])   . Traumatic compartment syndrome of lower extremity ([place])   . Infection of lumbar spine ([place]) 11/01/2016  . Delirium tremens ([place])   . Abnormal transaminases   . Alcoholic hepatitis without ascites   . ETOH abuse   . Abscess of right lower extremity   . Cellulitis and abscess of right lower extremity   . Hypernatremia   . Cellulitis of right lower extremity 10/30/2016  . EtOH dependence ([place]) 10/30/2016  . Thrombocytopenia ([place]) 10/30/2016  . Cirrhosis ([place])   . Paraspinal abscess ([place])   . Chronic hepatitis C  without hepatic coma ([place])   . Hepatitis   . Epidural abscess 10/29/2016  . Liver failure ([place]) 10/29/2016  . Sepsis ([place]) 10/29/2016  . Hyponatremia 10/29/2016     Health Maintenance Due  Topic Date Due  . TETANUS/TDAP  12/22/1999  . INFLUENZA VACCINE  04/28/2016    Social History  Substance Use Topics  . Smoking status: Current Some Day Smoker  . Smokeless tobacco: Never Used     Comment: 5 a week - social   . Alcohol use Yes     Comment: none since 09/27/16   Family History  Problem Relation Age of Onset  . Diabetes Mother    Review of Systems Review of Systems  Constitutional: Negative for fever, chills, diaphoresis, activity change, appetite change, fatigue and unexpected weight change.  HENT: Negative for congestion, sore throat, rhinorrhea, sneezing, trouble swallowing and sinus pressure.  Eyes: Negative for photophobia and visual disturbance.  Respiratory: Negative for cough, chest tightness, shortness of breath, wheezing and stridor.  Cardiovascular: Negative for chest pain, palpitations and leg swelling.  Gastrointestinal: Negative for nausea, vomiting, abdominal pain, diarrhea, constipation, blood in stool, abdominal distention and anal bleeding.  Genitourinary: Negative for dysuria, hematuria, flank pain and difficulty urinating.  Musculoskeletal: occ back pain    Physical Exam   BP 116/78   Pulse 83   Temp 97.6 F (36.4 C) (Oral)   Ht '5\' 11"'  (1.803 m)   Wt 235 lb (106.6 kg)   BMI 32.78 kg/m  Physical Exam  Constitutional: He is oriented to person, place, and time. He appears well-developed and well-nourished. No distress.  HENT:  Mouth/Throat: Oropharynx is clear and moist. No oropharyngeal exudate.  Cardiovascular: Normal rate, regular rhythm and normal heart sounds. Exam reveals no gallop and no friction rub.  No murmur heard.  Pulmonary/Chest: Effort normal and breath sounds normal. No respiratory distress. He has no wheezes.  Abdominal: Soft.  Bowel sounds are normal. He exhibits no distension. There is no tenderness.  Lymphadenopathy:  He has no cervical adenopathy.  Neurological: He is alert and oriented to person, place, and time.  Skin: Skin is warm and dry. No rash noted. No erythema.  Psychiatric: He has a normal mood and affect. His behavior is normal.    CBC Lab Results  Component Value Date   WBC 6.1 12/02/2016   RBC 3.91 (L) 12/02/2016   HGB 12.5 (L) 12/02/2016   HCT 38.6 (L) 12/02/2016   PLT PLATELET CLUMPS NOTED ON SMEAR, UNABLE TO ESTIMATE 12/02/2016   MCV 98.7 12/02/2016   MCH 32.0 12/02/2016   MCHC 32.4 12/02/2016   RDW 13.9 12/02/2016   LYMPHSABS 2.6 11/17/2016   MONOABS 0.8 11/17/2016   EOSABS 0.3 11/17/2016    BMET Lab Results  Component Value Date   NA 138 12/02/2016  K 4.0 12/02/2016   CL 107 12/02/2016   CO2 27 12/02/2016   GLUCOSE 84 12/02/2016   BUN 7 12/02/2016   CREATININE 0.59 (L) 12/02/2016   CALCIUM 8.5 (L) 12/02/2016   GFRNONAA >60 12/02/2016   GFRAA >60 12/02/2016    Lab Results  Component Value Date   ESRSEDRATE 14 12/09/2016   Lab Results  Component Value Date   CRP 7.4 12/09/2016      Assessment and Plan  Epidural spine infection = has been on prolonged abtx. We will check his inflammatory markers by doing lab work,  Slowly healing. His inflammatory markers are nearly normalized ESR is good, CRP still up but improved. He still has abtx til 3/22 which should suffice.

## 2016-12-10 LAB — C-REACTIVE PROTEIN: CRP: 7.4 mg/L (ref <8.0)

## 2016-12-10 LAB — SEDIMENTATION RATE: SED RATE: 14 mm/h (ref 0–15)

## 2016-12-18 DIAGNOSIS — G062 Extradural and subdural abscess, unspecified: Secondary | ICD-10-CM | POA: Diagnosis not present

## 2016-12-18 DIAGNOSIS — L02415 Cutaneous abscess of right lower limb: Secondary | ICD-10-CM | POA: Diagnosis not present

## 2016-12-18 DIAGNOSIS — M0088 Arthritis due to other bacteria, vertebrae: Secondary | ICD-10-CM | POA: Diagnosis not present

## 2016-12-18 DIAGNOSIS — L03115 Cellulitis of right lower limb: Secondary | ICD-10-CM | POA: Diagnosis not present

## 2017-01-05 ENCOUNTER — Encounter (HOSPITAL_BASED_OUTPATIENT_CLINIC_OR_DEPARTMENT_OTHER): Payer: BLUE CROSS/BLUE SHIELD | Attending: Surgery

## 2017-01-05 ENCOUNTER — Ambulatory Visit: Payer: Self-pay | Admitting: Physician Assistant

## 2017-01-05 ENCOUNTER — Encounter (HOSPITAL_COMMUNITY): Payer: Self-pay | Admitting: *Deleted

## 2017-01-05 DIAGNOSIS — T8189XA Other complications of procedures, not elsewhere classified, initial encounter: Secondary | ICD-10-CM | POA: Diagnosis present

## 2017-01-05 DIAGNOSIS — G062 Extradural and subdural abscess, unspecified: Secondary | ICD-10-CM | POA: Diagnosis present

## 2017-01-05 DIAGNOSIS — Y838 Other surgical procedures as the cause of abnormal reaction of the patient, or of later complication, without mention of misadventure at the time of the procedure: Secondary | ICD-10-CM | POA: Diagnosis not present

## 2017-01-05 DIAGNOSIS — Z79899 Other long term (current) drug therapy: Secondary | ICD-10-CM | POA: Insufficient documentation

## 2017-01-05 DIAGNOSIS — F17200 Nicotine dependence, unspecified, uncomplicated: Secondary | ICD-10-CM | POA: Insufficient documentation

## 2017-01-05 DIAGNOSIS — B182 Chronic viral hepatitis C: Secondary | ICD-10-CM | POA: Diagnosis not present

## 2017-01-05 DIAGNOSIS — F17210 Nicotine dependence, cigarettes, uncomplicated: Secondary | ICD-10-CM | POA: Diagnosis not present

## 2017-01-05 DIAGNOSIS — K7460 Unspecified cirrhosis of liver: Secondary | ICD-10-CM | POA: Diagnosis not present

## 2017-01-06 ENCOUNTER — Ambulatory Visit (HOSPITAL_COMMUNITY)
Admission: RE | Admit: 2017-01-06 | Discharge: 2017-01-06 | Disposition: A | Discharge status: Home / Self Care | Payer: BLUE CROSS/BLUE SHIELD | Source: Ambulatory Visit | Attending: Orthopedic Surgery | Admitting: Orthopedic Surgery

## 2017-01-06 ENCOUNTER — Ambulatory Visit (HOSPITAL_COMMUNITY): Payer: BLUE CROSS/BLUE SHIELD | Admitting: Anesthesiology

## 2017-01-06 ENCOUNTER — Encounter (HOSPITAL_COMMUNITY): Payer: Self-pay | Admitting: *Deleted

## 2017-01-06 ENCOUNTER — Encounter (HOSPITAL_COMMUNITY)
Admission: RE | Disposition: A | Discharge status: Home / Self Care | Payer: Self-pay | Source: Ambulatory Visit | Attending: Orthopedic Surgery

## 2017-01-06 DIAGNOSIS — F17210 Nicotine dependence, cigarettes, uncomplicated: Secondary | ICD-10-CM | POA: Insufficient documentation

## 2017-01-06 DIAGNOSIS — G062 Extradural and subdural abscess, unspecified: Secondary | ICD-10-CM | POA: Insufficient documentation

## 2017-01-06 DIAGNOSIS — Z79899 Other long term (current) drug therapy: Secondary | ICD-10-CM | POA: Insufficient documentation

## 2017-01-06 HISTORY — PX: INCISION AND DRAINAGE OF WOUND: SHX1803

## 2017-01-06 LAB — COMPREHENSIVE METABOLIC PANEL
ALBUMIN: 3.1 g/dL — AB (ref 3.5–5.0)
ALT: 78 U/L — ABNORMAL HIGH (ref 17–63)
ANION GAP: 8 (ref 5–15)
AST: 89 U/L — ABNORMAL HIGH (ref 15–41)
Alkaline Phosphatase: 113 U/L (ref 38–126)
BILIRUBIN TOTAL: 1.8 mg/dL — AB (ref 0.3–1.2)
BUN: 8 mg/dL (ref 6–20)
CALCIUM: 9.3 mg/dL (ref 8.9–10.3)
CO2: 24 mmol/L (ref 22–32)
Chloride: 106 mmol/L (ref 101–111)
Creatinine, Ser: 0.51 mg/dL — ABNORMAL LOW (ref 0.61–1.24)
GFR calc non Af Amer: >60 mL/min (ref >60)
GLUCOSE: 94 mg/dL (ref 65–99)
POTASSIUM: 3.9 mmol/L (ref 3.5–5.1)
Sodium: 138 mmol/L (ref 135–145)
TOTAL PROTEIN: 7.5 g/dL (ref 6.5–8.1)

## 2017-01-06 LAB — CBC
HEMATOCRIT: 38.6 % — AB (ref 39.0–52.0)
Hemoglobin: 12.8 g/dL — ABNORMAL LOW (ref 13.0–17.0)
MCH: 30.8 pg (ref 26.0–34.0)
MCHC: 33.2 g/dL (ref 30.0–36.0)
MCV: 93 fL (ref 78.0–100.0)
Platelets: 120 10*3/uL — ABNORMAL LOW (ref 150–400)
RBC: 4.15 MIL/uL — AB (ref 4.22–5.81)
RDW: 14.7 % (ref 11.5–15.5)
WBC: 5.8 10*3/uL (ref 4.0–10.5)

## 2017-01-06 SURGERY — IRRIGATION AND DEBRIDEMENT WOUND
Anesthesia: General | Site: Spine Lumbar

## 2017-01-06 MED ORDER — PHENYLEPHRINE HCL 10 MG/ML IJ SOLN
INTRAMUSCULAR | Status: DC | PRN
  Administered 2017-01-06 (×2): 80 ug via INTRAVENOUS
  Administered 2017-01-06 (×3): 120 ug via INTRAVENOUS

## 2017-01-06 MED ORDER — PROPOFOL 10 MG/ML IV BOLUS
INTRAVENOUS | Status: AC
  Filled 2017-01-06: qty 20

## 2017-01-06 MED ORDER — PROPOFOL 10 MG/ML IV BOLUS
INTRAVENOUS | Status: DC | PRN
  Administered 2017-01-06 (×2): 200 mg via INTRAVENOUS

## 2017-01-06 MED ORDER — SUCCINYLCHOLINE CHLORIDE 200 MG/10ML IV SOSY
PREFILLED_SYRINGE | INTRAVENOUS | Status: AC
  Filled 2017-01-06: qty 10

## 2017-01-06 MED ORDER — OXYCODONE HCL 5 MG PO TABS: 5.0000 mg | ORAL_TABLET | ORAL | 0 refills | Status: DC | PRN

## 2017-01-06 MED ORDER — EPHEDRINE 5 MG/ML INJ
INTRAVENOUS | Status: AC
  Filled 2017-01-06: qty 10

## 2017-01-06 MED ORDER — 0.9 % SODIUM CHLORIDE (POUR BTL) OPTIME
TOPICAL | Status: DC | PRN
  Administered 2017-01-06: 1000 mL

## 2017-01-06 MED ORDER — CEFAZOLIN SODIUM-DEXTROSE 2-4 GM/100ML-% IV SOLN
2.0000 g | INTRAVENOUS | Status: AC
  Administered 2017-01-06: 2 g via INTRAVENOUS
  Filled 2017-01-06: qty 100

## 2017-01-06 MED ORDER — PHENYLEPHRINE 40 MCG/ML (10ML) SYRINGE FOR IV PUSH (FOR BLOOD PRESSURE SUPPORT)
PREFILLED_SYRINGE | INTRAVENOUS | Status: AC
  Filled 2017-01-06: qty 20

## 2017-01-06 MED ORDER — SODIUM CHLORIDE 0.9 % IR SOLN
Status: DC | PRN
  Administered 2017-01-06: 6000 mL

## 2017-01-06 MED ORDER — LACTATED RINGERS IV SOLN
INTRAVENOUS | Status: DC
Start: 2017-01-06 — End: 2017-01-06
  Administered 2017-01-06 (×2): via INTRAVENOUS

## 2017-01-06 MED ORDER — MIDAZOLAM HCL 5 MG/5ML IJ SOLN
INTRAMUSCULAR | Status: DC | PRN
  Administered 2017-01-06 (×2): 1 mg via INTRAVENOUS

## 2017-01-06 MED ORDER — ACETAMINOPHEN 10 MG/ML IV SOLN
INTRAVENOUS | Status: AC
Start: 2017-01-06 — End: 2017-01-06
  Filled 2017-01-06: qty 100

## 2017-01-06 MED ORDER — HYDROMORPHONE HCL 1 MG/ML IJ SOLN
INTRAMUSCULAR | Status: AC
  Filled 2017-01-06: qty 0.5

## 2017-01-06 MED ORDER — FENTANYL CITRATE (PF) 100 MCG/2ML IJ SOLN
INTRAMUSCULAR | Status: DC | PRN
Start: 2017-01-06 — End: 2017-01-06
  Administered 2017-01-06 (×2): 50 ug via INTRAVENOUS
  Administered 2017-01-06: 100 ug via INTRAVENOUS
  Administered 2017-01-06: 50 ug via INTRAVENOUS

## 2017-01-06 MED ORDER — MIDAZOLAM HCL 2 MG/2ML IJ SOLN
INTRAMUSCULAR | Status: AC
  Filled 2017-01-06: qty 2

## 2017-01-06 MED ORDER — HYDROMORPHONE HCL 1 MG/ML IJ SOLN
0.2500 mg | INTRAMUSCULAR | Status: DC | PRN
  Administered 2017-01-06 (×2): 0.5 mg via INTRAVENOUS

## 2017-01-06 MED ORDER — LIDOCAINE 2% (20 MG/ML) 5 ML SYRINGE
INTRAMUSCULAR | Status: AC
Start: 2017-01-06 — End: 2017-01-06
  Filled 2017-01-06: qty 5

## 2017-01-06 MED ORDER — SUCCINYLCHOLINE CHLORIDE 20 MG/ML IJ SOLN
INTRAMUSCULAR | Status: DC | PRN
  Administered 2017-01-06: 120 mg via INTRAVENOUS

## 2017-01-06 MED ORDER — METOPROLOL TARTRATE 5 MG/5ML IV SOLN
INTRAVENOUS | Status: AC
  Filled 2017-01-06: qty 5

## 2017-01-06 MED ORDER — PROMETHAZINE HCL 25 MG/ML IJ SOLN: 6.2500 mg | INTRAMUSCULAR | Status: DC | PRN

## 2017-01-06 MED ORDER — FENTANYL CITRATE (PF) 250 MCG/5ML IJ SOLN
INTRAMUSCULAR | Status: AC
  Filled 2017-01-06: qty 5

## 2017-01-06 MED ORDER — ARTIFICIAL TEARS OP OINT
TOPICAL_OINTMENT | OPHTHALMIC | Status: AC
  Filled 2017-01-06: qty 3.5

## 2017-01-06 MED ORDER — LIDOCAINE HCL (CARDIAC) 20 MG/ML IV SOLN
INTRAVENOUS | Status: DC | PRN
  Administered 2017-01-06: 100 mg via INTRAVENOUS

## 2017-01-06 MED ORDER — OXYCODONE HCL 5 MG PO TABS: 5.0000 mg | ORAL_TABLET | ORAL | Status: DC | PRN

## 2017-01-06 MED ORDER — GLYCOPYRROLATE 0.2 MG/ML IJ SOLN
INTRAMUSCULAR | Status: DC | PRN
  Administered 2017-01-06 (×2): 0.2 mg via INTRAVENOUS

## 2017-01-06 MED ORDER — METOPROLOL TARTARATE 1 MG/ML SYRINGE (5ML)
Status: DC | PRN
  Administered 2017-01-06 (×2): 1 mg via INTRAVENOUS

## 2017-01-06 MED ORDER — ONDANSETRON HCL 4 MG/2ML IJ SOLN
INTRAMUSCULAR | Status: DC | PRN
  Administered 2017-01-06: 4 mg via INTRAVENOUS

## 2017-01-06 SURGICAL SUPPLY — 65 items
BUR EGG ELITE 4.0 (BURR) IMPLANT
BUR EGG ELITE 4.0MM (BURR)
BUR MATCHSTICK NEURO 3.0 LAGG (BURR) ×1 IMPLANT
CANISTER SUCT 3000ML PPV (MISCELLANEOUS) ×3 IMPLANT
CLOSURE STERI-STRIP 1/2X4 (GAUZE/BANDAGES/DRESSINGS) ×1
CLSR STERI-STRIP ANTIMIC 1/2X4 (GAUZE/BANDAGES/DRESSINGS) ×2 IMPLANT
CORDS BIPOLAR (ELECTRODE) ×3 IMPLANT
COVER SURGICAL LIGHT HANDLE (MISCELLANEOUS) ×3 IMPLANT
DRAIN CHANNEL 15F RND FF W/TCR (WOUND CARE) ×1 IMPLANT
DRAPE POUCH INSTRU U-SHP 10X18 (DRAPES) ×3 IMPLANT
DRAPE SURG 17X23 STRL (DRAPES) ×9 IMPLANT
DRAPE U-SHAPE 47X51 STRL (DRAPES) ×3 IMPLANT
DRSG AQUACEL AG ADV 3.5X10 (GAUZE/BANDAGES/DRESSINGS) ×1 IMPLANT
DURAPREP 26ML APPLICATOR (WOUND CARE) ×1 IMPLANT
ELECT BLADE 4.0 EZ CLEAN MEGAD (MISCELLANEOUS) ×3
ELECT CAUTERY BLADE 6.4 (BLADE) ×3 IMPLANT
ELECT PENCIL ROCKER SW 15FT (MISCELLANEOUS) ×3 IMPLANT
ELECT REM PT RETURN 9FT ADLT (ELECTROSURGICAL) ×3
ELECTRODE BLDE 4.0 EZ CLN MEGD (MISCELLANEOUS) IMPLANT
ELECTRODE REM PT RTRN 9FT ADLT (ELECTROSURGICAL) ×1 IMPLANT
EVACUATOR 1/8 PVC DRAIN (DRAIN) IMPLANT
EVACUATOR SILICONE 100CC (DRAIN) ×1 IMPLANT
GLOVE BIO SURGEON STRL SZ 6.5 (GLOVE) ×2 IMPLANT
GLOVE BIO SURGEONS STRL SZ 6.5 (GLOVE) ×1
GLOVE BIOGEL PI IND STRL 6.5 (GLOVE) ×1 IMPLANT
GLOVE BIOGEL PI IND STRL 8.5 (GLOVE) ×1 IMPLANT
GLOVE BIOGEL PI INDICATOR 6.5 (GLOVE) ×2
GLOVE BIOGEL PI INDICATOR 8.5 (GLOVE) ×2
GLOVE SS BIOGEL STRL SZ 8.5 (GLOVE) ×1 IMPLANT
GLOVE SUPERSENSE BIOGEL SZ 8.5 (GLOVE) ×2
GOWN STRL REUS W/ TWL XL LVL3 (GOWN DISPOSABLE) ×2 IMPLANT
GOWN STRL REUS W/TWL 2XL LVL3 (GOWN DISPOSABLE) ×4 IMPLANT
GOWN STRL REUS W/TWL XL LVL3 (GOWN DISPOSABLE) ×3
HANDPIECE INTERPULSE COAX TIP (DISPOSABLE) ×3
KIT BASIN OR (CUSTOM PROCEDURE TRAY) ×3 IMPLANT
KIT DRSG VAC SLVR GRANUFM (MISCELLANEOUS) ×4 IMPLANT
KIT ROOM TURNOVER OR (KITS) ×3 IMPLANT
NDL SPNL 18GX3.5 QUINCKE PK (NEEDLE) ×2 IMPLANT
NEEDLE 22X1 1/2 (OR ONLY) (NEEDLE) ×1 IMPLANT
NEEDLE SPNL 18GX3.5 QUINCKE PK (NEEDLE) IMPLANT
NS IRRIG 1000ML POUR BTL (IV SOLUTION) ×3 IMPLANT
PACK LAMINECTOMY ORTHO (CUSTOM PROCEDURE TRAY) ×3 IMPLANT
PACK UNIVERSAL I (CUSTOM PROCEDURE TRAY) ×3 IMPLANT
PAD ARMBOARD 7.5X6 YLW CONV (MISCELLANEOUS) ×6 IMPLANT
PATTIES SURGICAL .5 X.5 (GAUZE/BANDAGES/DRESSINGS) IMPLANT
PATTIES SURGICAL .5 X1 (DISPOSABLE) ×1 IMPLANT
SET HNDPC FAN SPRY TIP SCT (DISPOSABLE) IMPLANT
SPONGE SURGIFOAM ABS GEL 100 (HEMOSTASIS) ×1 IMPLANT
SURGIFLO W/THROMBIN 8M KIT (HEMOSTASIS) IMPLANT
SUT BONE WAX W31G (SUTURE) ×3 IMPLANT
SUT MON AB 3-0 SH 27 (SUTURE)
SUT MON AB 3-0 SH27 (SUTURE) ×1 IMPLANT
SUT VIC AB 0 CT1 27 (SUTURE)
SUT VIC AB 0 CT1 27XBRD ANBCTR (SUTURE) ×1 IMPLANT
SUT VIC AB 1 CT1 27 (SUTURE)
SUT VIC AB 1 CT1 27XBRD ANBCTR (SUTURE) ×1 IMPLANT
SUT VIC AB 1 CTX 36 (SUTURE)
SUT VIC AB 1 CTX36XBRD ANBCTR (SUTURE) ×2 IMPLANT
SUT VIC AB 2-0 CT1 18 (SUTURE) ×1 IMPLANT
SYR BULB IRRIGATION 50ML (SYRINGE) ×5 IMPLANT
SYR CONTROL 10ML LL (SYRINGE) ×3 IMPLANT
TOWEL OR 17X24 6PK STRL BLUE (TOWEL DISPOSABLE) ×3 IMPLANT
TOWEL OR 17X26 10 PK STRL BLUE (TOWEL DISPOSABLE) ×3 IMPLANT
WATER STERILE IRR 1000ML POUR (IV SOLUTION) ×3 IMPLANT
YANKAUER SUCT BULB TIP NO VENT (SUCTIONS) ×3 IMPLANT

## 2017-01-06 NOTE — Anesthesia Preprocedure Evaluation (Signed)
Anesthesia Evaluation  Patient identified by MRN, date of birth, ID band Patient awake    Reviewed: Allergy & Precautions, NPO status , Patient's Chart, lab work & pertinent test results  Airway Mallampati: II  TM Distance: >3 FB     Dental no notable dental hx.    Pulmonary Current Smoker, former smoker,    breath sounds clear to auscultation       Cardiovascular  Rhythm:Regular Rate:Normal     Neuro/Psych    GI/Hepatic (+)     substance abuse  alcohol use, Hepatitis -  Endo/Other    Renal/GU      Musculoskeletal   Abdominal   Peds  Hematology   Anesthesia Other Findings   Reproductive/Obstetrics                             Anesthesia Physical Anesthesia Plan  ASA: III  Anesthesia Plan: General   Post-op Pain Management:    Induction: Intravenous  Airway Management Planned: Oral ETT  Additional Equipment:   Intra-op Plan:   Post-operative Plan: Extubation in OR  Informed Consent: I have reviewed the patients History and Physical, chart, labs and discussed the procedure including the risks, benefits and alternatives for the proposed anesthesia with the patient or authorized representative who has indicated his/her understanding and acceptance.   Dental advisory given  Plan Discussed with: CRNA  Anesthesia Plan Comments:         Anesthesia Quick Evaluation

## 2017-01-06 NOTE — Anesthesia Procedure Notes (Signed)
Procedure Name: Intubation Date/Time: 01/06/2017 2:00 PM Performed by: Rica Koyanagi Pre-anesthesia Checklist: Patient identified, Emergency Drugs available, Suction available and Patient being monitored Patient Re-evaluated:Patient Re-evaluated prior to inductionOxygen Delivery Method: Circle system utilized Preoxygenation: Pre-oxygenation with 100% oxygen Intubation Type: IV induction Ventilation: Mask ventilation without difficulty Laryngoscope Size: Mac and 4 Grade View: Grade I Tube type: Oral Tube size: 7.5 mm Number of attempts: 1 Airway Equipment and Method: Patient positioned with wedge pillow and Stylet Placement Confirmation: ETT inserted through vocal cords under direct vision,  positive ETCO2 and breath sounds checked- equal and bilateral Secured at: 23 cm Tube secured with: Tape Dental Injury: Teeth and Oropharynx as per pre-operative assessment

## 2017-01-06 NOTE — H&P (Signed)
History: Patient initially presented to my care on 11/01/16 with increasing back pain and leg swelling.  Ultimately diagnosed with L4/5 facet infection with epidural abscess.  He underwent a formal surgical decompression and wash out.  Over the course of the next month he underwent several surgical I&D's and application of a wound vac.  The wound was improving and there were no signs of infection and so on 12/02/16 the wound vac was removed and the wound closed.  He did well post-operatively until he was seen this week.  Noted small drainage form the wound.  I ultimately packed the wound and had him seen at the outpatient wound center.  Dr Con Memos contacted me yesterday to express concern about recurrent infection. Repeat MRI demonstrated large abscess cavity as well as bine changes consistent with osteomyelitis.  As a result I contacted patient for repeat wash out and application of wound vac.  It should be noted that the use of the wound vac was requested by myself and Dr. Con Memos and is medically necessary for the management of an abscess cavity of this size.  In addition Dr Con Memos will take over care following surgery.  He has agreed to manage wound vac changes and management of the wound.  In addition the patient will continue to follow up with infection disease.  Depending upon there request we may convert from oral to IV antibiotic therapy.  Problem List/Past Medical  Lumbar radiculopathy (M54.16)  Edema, leg (R60.0)  Other orthopedic aftercare (Z47.89)  Abscess of right thigh (L02.415)  Problems Reconciled   Allergies  Acetaminophen (due to liver disease) Allergies Reconciled   Social History Tobacco / smoke exposure  10/21/2016: yes outdoors only Tobacco use  Current some day smoker. 10/21/2016: smoke(d) less than 1/2 pack(s) per day Children  1 Not under pain contract  Current drinker  10/21/2016: Currently drinks beer, wine and hard liquor 5-7 times per week Current work status   working full time Exercise  Exercises weekly; does running / walking and gym / [employer] Living situation  live with partner Marital status  partnered No history of drug/alcohol rehab  Number of flights of stairs before winded  greater than 5  Medication History Pantoprazole Sodium (40MG  Tablet DR, 40 mg Oral, Taken starting 11/20/2016) Active. Amoxicillin-Pot Clavulanate (875-125MG  Tablet, 1 tbl Oral, Taken starting 11/20/2016) Active. (Qty: 60; Refills: 1) Folic Acid (1MG  Tablet, 1 mg Oral, Taken starting 11/19/2016) Active. (Qty: 30) RisperiDONE (0.25MG  Tablet, 0.25 mg Oral, Taken starting 11/19/2016) Active. (Qty: 5) Thiamine HCl (100MG  Tablet, 100 mg Oral, Taken starting 11/19/2016) Active. (Qty: 30) Constulose (10GM/15ML Solution, Oral) Active. Ondansetron HCl (4MG  Tablet, 4 mg Oral) Active. Medications Reconciled  Other Problems Hepatitis C   Physical: A+O X3 No SOB/CP.  Heart: RRR no rubs/gallops/murmurs He has had no fevers or chills. No significant increasing leg pain. No burning or discomfort with urination. No focal neuro deficits Decreasing back pain Normal gait pattern Back: drainage from the wound noted. Wound packed.  MRI: see office chart for details.  It shows enhancement at the L2-L3 spinal process consistent with possible osteomyelitis as well as some mild enhancement around the L5 pedicle and superior L5 facet. At this point time, no evidence of any epidural abscess.  Plan: The patient was seen yesterday at the wound care clinic by Dr. Con Memos. He felt as though the subcutaneous drainage is secondary to a large abscess cavity that was quite deep and quite extensive. At this point after discussing with him, he felt  as though opening the wound up and putting in a VAC and letting it heal secondarily with the potential of a skin graft or a soft tissue procedure later on, was the best course of action.  In discussing this, he asked me to do the initial I  and D and reinsertion of a VAC dressing and then he would continue to monitor it from there on out with the dressing changes, the wound evaluation and antibiotic management.   Risks reviewed with patient: infection, bleeding, nerve damage, need for additional surgery, death, stroke, paralysis. All questions addressed

## 2017-01-06 NOTE — Brief Op Note (Signed)
01/06/2017  3:10 PM  PATIENT:  Estill Cotta Temme  36 y.o. male  PRE-OPERATIVE DIAGNOSIS:  Wound infection recurrent lumbar spine  POST-OPERATIVE DIAGNOSIS:  Wound infection recurrent lumbar spine  PROCEDURE:  Procedure(s): IRRIGATION AND DEBRIDEMENT and placement of wound vac (N/A)  SURGEON:  Surgeon(s) and Role:    * Melina Schools, MD - Primary  PHYSICIAN ASSISTANT:   ASSISTANTS: none   ANESTHESIA:   general  EBL:  Total I/O In: 1600 [I.V.:1600] Out: 50 [Blood:50]  BLOOD ADMINISTERED:none  DRAINS: wound vac   LOCAL MEDICATIONS USED:  NONE  SPECIMEN:  Aspirate sent to micro  DISPOSITION OF SPECIMEN:  N/A  COUNTS:  YES  TOURNIQUET:  * No tourniquets in log *  DICTATION: .Other Dictation: Dictation Number [id number]  PLAN OF CARE: Discharge to home after PACU  PATIENT DISPOSITION:  PACU - hemodynamically stable.

## 2017-01-06 NOTE — Transfer of Care (Signed)
Immediate Anesthesia Transfer of Care Note  Patient: [name]  Procedure(s) Performed: Procedure(s): IRRIGATION AND DEBRIDEMENT and placement of wound vac (N/A)  Patient Location: PACU  Anesthesia Type:General  Level of Consciousness: awake, alert  and patient cooperative  Airway & Oxygen Therapy: Patient Spontanous Breathing and Patient connected to nasal cannula oxygen  Post-op Assessment: Report given to RN, Post -op Vital signs reviewed and stable, Patient moving all extremities X 4 and Patient able to stick tongue midline  Post vital signs: Reviewed and stable  Last Vitals:  Vitals:   01/06/17 1125 01/06/17 1504  BP: 137/63   Pulse: 69   Resp: 18   Temp: 36.6 C (P) 36.7 C    Last Pain:  Vitals:   01/06/17 1131  TempSrc:   PainSc: 2       Patients Stated Pain Goal: 6 (21/03/12 8118)  Complications: No apparent anesthesia complications

## 2017-01-07 ENCOUNTER — Ambulatory Visit: Payer: BLUE CROSS/BLUE SHIELD | Admitting: Internal Medicine

## 2017-01-07 ENCOUNTER — Encounter (HOSPITAL_COMMUNITY): Payer: Self-pay | Admitting: Orthopedic Surgery

## 2017-01-07 NOTE — Op Note (Signed)
NAME:  Tripoli, Jacque                  ACCOUNT NO.:  MEDICAL RECORD NO.:  [id number]  LOCATION:                                 FACILITY:  PHYSICIAN:  Jaelyne Deeg D. Rolena Infante, M.D. DATE OF BIRTH:  09-05-1981  DATE OF PROCEDURE:  01/06/2017 DATE OF DISCHARGE:                              OPERATIVE REPORT   PREOPERATIVE DIAGNOSIS:  Persistent recurrent infection with abscess formation, lumbar spine.  POSTOPERATIVE DIAGNOSIS:  Persistent recurrent infection with abscess formation, lumbar spine.  OPERATIVE PROCEDURE:  Repeat formal I and D with insertion of wound VAC.  COMPLICATIONS:  None.  CONDITION:  Stable.  HISTORY:  This is a very pleasant 36 year old gentleman who initially presented to me in early February and was admitted from the emergency room with liver failure, jaundice, and a L4-5 facet infection with epidural abscess.  Once he was medically stable, he was taken to the operating room for formal I and D and debridement of the epidural abscess.  His postoperative course was complicated and ultimately resulted in a wound infection that required multiple I and D's and a VAC wound until it ultimately healed and was closed in the beginning part of March.  However, a repeat followup earlier this week and at the [facility], it was felt as though he had a large abscess cavity. Repeat MRI confirmed this.  After discussing with the wound care nurse, I agreed to take him back to the operating room for a formal I and D and insertion of a new wound VAC.  OPERATIVE NOTE:  The patient was brought to the operating room and placed supine on the operating table.  After successful induction of general anesthesia and intubation, TEDs and SCDs were applied, and the back was prepped and draped in a standard fashion.  Time-out was taken confirming patient, procedure, and all other pertinent important data. Once this was done, an elliptical incision was made to excise the old incision  and removed the area where the wound had broken down.  I sharply dissected down to the deep adipose tissue, and there was some purulent material, which I cultured.  I then dissected through the deep lumbar fascia and stripped the paraspinal muscles to expose the L3 and a portion of the L2 spinous process.  Based on the MRI, there was some question of osteo and so, I took the tips of these bones for culture.  I then bluntly dissected into the paraspinal muscles in order to completely to debride the area.  I removed all purulent material sharply.  I then pulse lavaged with a total of 6 L of fluid.  A wound VAC was then inserted and attached.  The patient was ultimately extubated and transferred to the PACU without incident.  At the end of the case, all needle and sponge counts were correct.  There were no adverse intraoperative events.  PLAN:  The patient will be discharged to home with a home wound VAC.  I have spoken with Dr. Con Memos at the [facility] and he will assume care.  The patient will be seen Friday at the [facility] for a VAC dressing change and then  follow up with Dr. Con Memos on Tuesday for ongoing management of the wound infection.  If there is any issues, problems, or concerns, both the patient and Dr. Con Memos know to contact me.     Aracely Rickett D. Rolena Infante, M.D.     DDB/MEDQ  D:  01/06/2017  T:  01/06/2017  Job:  [id number]  cc:   Duane Lope D. Rolena Infante, M.D.

## 2017-01-07 NOTE — Anesthesia Postprocedure Evaluation (Addendum)
Anesthesia Post Note  Patient: [name]  Procedure(s) Performed: Procedure(s) (LRB): IRRIGATION AND DEBRIDEMENT and placement of wound vac (N/A)  Patient location during evaluation: PACU Anesthesia Type: General Level of consciousness: awake and sedated Pain management: pain level controlled Vital Signs Assessment: post-procedure vital signs reviewed and stable Respiratory status: spontaneous breathing, nonlabored ventilation, respiratory function stable and patient connected to nasal cannula oxygen Cardiovascular status: blood pressure returned to baseline and stable Postop Assessment: no signs of nausea or vomiting Anesthetic complications: no       Last Vitals:  Vitals:   01/06/17 1600 01/06/17 1601  BP:  122/79  Pulse: 81 79  Resp: 14 14  Temp:      Last Pain:  Vitals:   01/06/17 1600  TempSrc:   PainSc: 0-No pain                 Myana Schlup,JAMES TERRILL

## 2017-01-08 ENCOUNTER — Telehealth: Payer: Self-pay

## 2017-01-08 DIAGNOSIS — T8189XA Other complications of procedures, not elsewhere classified, initial encounter: Secondary | ICD-10-CM | POA: Diagnosis not present

## 2017-01-08 LAB — AEROBIC CULTURE W GRAM STAIN (SUPERFICIAL SPECIMEN)

## 2017-01-08 LAB — AEROBIC CULTURE  (SUPERFICIAL SPECIMEN)

## 2017-01-08 NOTE — Telephone Encounter (Signed)
Per Dr Tommy Medal this patient is to follow up with ID physician in a few weeks.  He is a patient of Dr Storm Frisk and is having complications with fungal infection of his back.  Dr Tommy Medal spoke with Dr Dietrich Pates' s office and advised on antibiotic treatment and f/u in ID clinic.   I have called the patient and given him appointment date and time.   Laverle Patter, RN

## 2017-01-11 ENCOUNTER — Encounter (HOSPITAL_COMMUNITY): Payer: Self-pay | Admitting: Orthopedic Surgery

## 2017-01-11 LAB — AEROBIC/ANAEROBIC CULTURE W GRAM STAIN (SURGICAL/DEEP WOUND)

## 2017-01-11 LAB — AEROBIC/ANAEROBIC CULTURE (SURGICAL/DEEP WOUND)

## 2017-01-12 DIAGNOSIS — T8189XA Other complications of procedures, not elsewhere classified, initial encounter: Secondary | ICD-10-CM | POA: Diagnosis not present

## 2017-01-15 DIAGNOSIS — T8189XA Other complications of procedures, not elsewhere classified, initial encounter: Secondary | ICD-10-CM | POA: Diagnosis not present

## 2017-01-19 ENCOUNTER — Encounter: Payer: Self-pay | Admitting: Internal Medicine

## 2017-01-19 ENCOUNTER — Ambulatory Visit (INDEPENDENT_AMBULATORY_CARE_PROVIDER_SITE_OTHER): Payer: BLUE CROSS/BLUE SHIELD | Admitting: Internal Medicine

## 2017-01-19 VITALS — BP 141/73 | HR 79 | Temp 97.7°F | Wt 234.0 lb

## 2017-01-19 DIAGNOSIS — M4620 Osteomyelitis of vertebra, site unspecified: Secondary | ICD-10-CM | POA: Diagnosis not present

## 2017-01-19 DIAGNOSIS — Z5181 Encounter for therapeutic drug level monitoring: Secondary | ICD-10-CM | POA: Diagnosis not present

## 2017-01-19 DIAGNOSIS — G062 Extradural and subdural abscess, unspecified: Secondary | ICD-10-CM | POA: Diagnosis not present

## 2017-01-19 DIAGNOSIS — T8189XA Other complications of procedures, not elsewhere classified, initial encounter: Secondary | ICD-10-CM | POA: Diagnosis not present

## 2017-01-19 DIAGNOSIS — B182 Chronic viral hepatitis C: Secondary | ICD-10-CM | POA: Diagnosis not present

## 2017-01-19 LAB — CBC WITH DIFFERENTIAL/PLATELET
Basophils Absolute: 159 {cells}/uL (ref 0–200)
Basophils Relative: 3 %
Eosinophils Absolute: 212 {cells}/uL (ref 15–500)
Eosinophils Relative: 4 %
HCT: 33.7 % — ABNORMAL LOW (ref 38.5–50.0)
Hemoglobin: 11.5 g/dL — ABNORMAL LOW (ref 13.2–17.1)
Lymphocytes Relative: 34 %
Lymphs Abs: 1802 {cells}/uL (ref 850–3900)
MCH: 30.4 pg (ref 27.0–33.0)
MCHC: 34.1 g/dL (ref 32.0–36.0)
MCV: 89.2 fL (ref 80.0–100.0)
MPV: 10.2 fL (ref 7.5–12.5)
Monocytes Absolute: 954 {cells}/uL — ABNORMAL HIGH (ref 200–950)
Monocytes Relative: 18 %
Neutro Abs: 2173 {cells}/uL (ref 1500–7800)
Neutrophils Relative %: 41 %
Platelets: 133 10*3/uL — ABNORMAL LOW (ref 140–400)
RBC: 3.78 MIL/uL — ABNORMAL LOW (ref 4.20–5.80)
RDW: 14.9 % (ref 11.0–15.0)
WBC: 5.3 10*3/uL (ref 3.8–10.8)

## 2017-01-19 LAB — COMPREHENSIVE METABOLIC PANEL WITH GFR
ALT: 37 U/L (ref 9–46)
AST: 77 U/L — ABNORMAL HIGH (ref 10–40)
Albumin: 3.2 g/dL — ABNORMAL LOW (ref 3.6–5.1)
Alkaline Phosphatase: 111 U/L (ref 40–115)
BUN: 10 mg/dL (ref 7–25)
CO2: 27 mmol/L (ref 20–31)
Calcium: 9.4 mg/dL (ref 8.6–10.3)
Chloride: 107 mmol/L (ref 98–110)
Creat: 0.55 mg/dL — ABNORMAL LOW (ref 0.60–1.35)
Glucose, Bld: 103 mg/dL — ABNORMAL HIGH (ref 65–99)
Potassium: 4.4 mmol/L (ref 3.5–5.3)
Sodium: 139 mmol/L (ref 135–146)
Total Bilirubin: 1.2 mg/dL (ref 0.2–1.2)
Total Protein: 7.3 g/dL (ref 6.1–8.1)

## 2017-01-19 MED ORDER — FLUCONAZOLE 200 MG PO TABS: 800.0000 mg | ORAL_TABLET | Freq: Every day | ORAL | 2 refills | Status: DC

## 2017-01-19 NOTE — Progress Notes (Signed)
   Subjective:    Patient ID: [name], male    DOB: [birth year], 36 y.o.   MRN: [id number]  HPI Here for follow up on epidural abscess.    He has a history of alcoholic cirrhosis, hepatitis C, followed in [facility] who developed back and leg pain in February with Strep infection of lumbar spine and pyomyositis of right leg and underwent wound debridement x 2 in February.  He was placed on ceftriaxone then and took for 6 weeks with amoxicillin/clav in follow up, which he continues to take.  He had more washout on 3/7 and had been doing well.  He though required further debridemen ton 4/11 by Dr. Rolena Infante and culture with C albicans.  He was started on fluconazole 800 mg daily and has been on this about 1 week.  He is tolerating well, taking daily.  No fever or chills.  No associated n/v/d.  Wound followed by Dr. Con Memos. Record reviewed and is getting hep C evaluation and cirrhosis management by [facility] in [place].    Review of Systems  Gastrointestinal: Negative for abdominal pain, diarrhea and nausea.  Skin: Negative for rash.  Neurological: Negative for dizziness.       Objective:   Physical Exam  Constitutional: He appears well-developed and well-nourished. No distress.  Eyes: No scleral icterus.  Cardiovascular: Normal rate, regular rhythm and normal heart sounds.   Pulmonary/Chest: Effort normal and breath sounds normal. No respiratory distress.  Musculoskeletal:  Wound vac over lumbar area, no surrounding erythema   SH: no current alcohol use       Assessment & Plan:

## 2017-01-19 NOTE — Assessment & Plan Note (Addendum)
Tolerating fluconazole.  Check CRP and ESR today.  I provided refills and likely will be on for months.  Follow up with Dr. Baxter Flattery in 4-5 weeks

## 2017-01-19 NOTE — Assessment & Plan Note (Signed)
Followed by GI

## 2017-01-19 NOTE — Assessment & Plan Note (Signed)
Will check cmp today  

## 2017-01-20 LAB — C-REACTIVE PROTEIN: CRP: 2.2 mg/L (ref <8.0)

## 2017-01-20 LAB — SEDIMENTATION RATE: Sed Rate: 13 mm/hr (ref 0–15)

## 2017-01-26 ENCOUNTER — Ambulatory Visit: Payer: BLUE CROSS/BLUE SHIELD | Admitting: Internal Medicine

## 2017-01-27 ENCOUNTER — Encounter (HOSPITAL_BASED_OUTPATIENT_CLINIC_OR_DEPARTMENT_OTHER): Payer: BLUE CROSS/BLUE SHIELD | Attending: Surgery

## 2017-01-27 DIAGNOSIS — B182 Chronic viral hepatitis C: Secondary | ICD-10-CM | POA: Insufficient documentation

## 2017-01-27 DIAGNOSIS — T8131XA Disruption of external operation (surgical) wound, not elsewhere classified, initial encounter: Secondary | ICD-10-CM | POA: Diagnosis not present

## 2017-01-27 DIAGNOSIS — K7460 Unspecified cirrhosis of liver: Secondary | ICD-10-CM | POA: Insufficient documentation

## 2017-01-27 DIAGNOSIS — Z87891 Personal history of nicotine dependence: Secondary | ICD-10-CM | POA: Diagnosis not present

## 2017-01-27 DIAGNOSIS — Y838 Other surgical procedures as the cause of abnormal reaction of the patient, or of later complication, without mention of misadventure at the time of the procedure: Secondary | ICD-10-CM | POA: Insufficient documentation

## 2017-02-03 DIAGNOSIS — T8131XA Disruption of external operation (surgical) wound, not elsewhere classified, initial encounter: Secondary | ICD-10-CM | POA: Diagnosis not present

## 2017-02-10 DIAGNOSIS — T8131XA Disruption of external operation (surgical) wound, not elsewhere classified, initial encounter: Secondary | ICD-10-CM | POA: Diagnosis not present

## 2017-02-17 DIAGNOSIS — T8131XA Disruption of external operation (surgical) wound, not elsewhere classified, initial encounter: Secondary | ICD-10-CM | POA: Diagnosis not present

## 2017-02-18 ENCOUNTER — Ambulatory Visit: Payer: BLUE CROSS/BLUE SHIELD | Admitting: Internal Medicine

## 2017-02-23 DIAGNOSIS — T8131XA Disruption of external operation (surgical) wound, not elsewhere classified, initial encounter: Secondary | ICD-10-CM | POA: Diagnosis not present

## 2017-02-26 NOTE — Addendum Note (Signed)
Addendum  created 02/26/17 1335 by Rica Koyanagi, MD   Sign clinical note

## 2017-02-27 NOTE — Addendum Note (Signed)
Addendum  created 02/27/17 0904 by Duane Boston, MD   Sign clinical note

## 2017-03-03 ENCOUNTER — Encounter (HOSPITAL_BASED_OUTPATIENT_CLINIC_OR_DEPARTMENT_OTHER): Payer: BLUE CROSS/BLUE SHIELD | Attending: Surgery

## 2017-03-03 DIAGNOSIS — K7460 Unspecified cirrhosis of liver: Secondary | ICD-10-CM | POA: Diagnosis not present

## 2017-03-03 DIAGNOSIS — Y838 Other surgical procedures as the cause of abnormal reaction of the patient, or of later complication, without mention of misadventure at the time of the procedure: Secondary | ICD-10-CM | POA: Insufficient documentation

## 2017-03-03 DIAGNOSIS — T8131XA Disruption of external operation (surgical) wound, not elsewhere classified, initial encounter: Secondary | ICD-10-CM | POA: Diagnosis present

## 2017-03-03 DIAGNOSIS — B182 Chronic viral hepatitis C: Secondary | ICD-10-CM | POA: Insufficient documentation

## 2017-03-03 DIAGNOSIS — Z87891 Personal history of nicotine dependence: Secondary | ICD-10-CM | POA: Diagnosis not present

## 2017-03-10 DIAGNOSIS — T8131XA Disruption of external operation (surgical) wound, not elsewhere classified, initial encounter: Secondary | ICD-10-CM | POA: Diagnosis not present

## 2017-03-11 ENCOUNTER — Ambulatory Visit: Payer: BLUE CROSS/BLUE SHIELD | Admitting: Internal Medicine

## 2017-03-17 DIAGNOSIS — T8131XA Disruption of external operation (surgical) wound, not elsewhere classified, initial encounter: Secondary | ICD-10-CM | POA: Diagnosis not present

## 2017-03-24 DIAGNOSIS — T8131XA Disruption of external operation (surgical) wound, not elsewhere classified, initial encounter: Secondary | ICD-10-CM | POA: Diagnosis not present

## 2017-04-07 ENCOUNTER — Encounter: Payer: Self-pay | Admitting: Internal Medicine

## 2017-04-07 ENCOUNTER — Ambulatory Visit (INDEPENDENT_AMBULATORY_CARE_PROVIDER_SITE_OTHER): Payer: BLUE CROSS/BLUE SHIELD | Admitting: Internal Medicine

## 2017-04-07 ENCOUNTER — Encounter (HOSPITAL_BASED_OUTPATIENT_CLINIC_OR_DEPARTMENT_OTHER): Payer: BLUE CROSS/BLUE SHIELD | Attending: Surgery

## 2017-04-07 VITALS — BP 128/82 | HR 82 | Temp 97.1°F | Wt 254.0 lb

## 2017-04-07 DIAGNOSIS — K7460 Unspecified cirrhosis of liver: Secondary | ICD-10-CM | POA: Insufficient documentation

## 2017-04-07 DIAGNOSIS — Z87891 Personal history of nicotine dependence: Secondary | ICD-10-CM | POA: Diagnosis not present

## 2017-04-07 DIAGNOSIS — T8189XA Other complications of procedures, not elsewhere classified, initial encounter: Secondary | ICD-10-CM | POA: Diagnosis present

## 2017-04-07 DIAGNOSIS — S31109S Unspecified open wound of abdominal wall, unspecified quadrant without penetration into peritoneal cavity, sequela: Secondary | ICD-10-CM

## 2017-04-07 DIAGNOSIS — B182 Chronic viral hepatitis C: Secondary | ICD-10-CM | POA: Insufficient documentation

## 2017-04-07 DIAGNOSIS — Y838 Other surgical procedures as the cause of abnormal reaction of the patient, or of later complication, without mention of misadventure at the time of the procedure: Secondary | ICD-10-CM | POA: Insufficient documentation

## 2017-04-07 DIAGNOSIS — S31000S Unspecified open wound of lower back and pelvis without penetration into retroperitoneum, sequela: Secondary | ICD-10-CM | POA: Diagnosis not present

## 2017-04-07 LAB — CBC WITH DIFFERENTIAL/PLATELET
BASOS PCT: 3 %
Basophils Absolute: 132 cells/uL (ref 0–200)
Eosinophils Absolute: 176 cells/uL (ref 15–500)
Eosinophils Relative: 4 %
HEMATOCRIT: 37.1 % — AB (ref 38.5–50.0)
HEMOGLOBIN: 12.2 g/dL — AB (ref 13.2–17.1)
LYMPHS ABS: 1760 {cells}/uL (ref 850–3900)
LYMPHS PCT: 40 %
MCH: 27.6 pg (ref 27.0–33.0)
MCHC: 32.9 g/dL (ref 32.0–36.0)
MCV: 83.9 fL (ref 80.0–100.0)
MONO ABS: 748 {cells}/uL (ref 200–950)
MPV: 10 fL (ref 7.5–12.5)
Monocytes Relative: 17 %
Neutro Abs: 1584 cells/uL (ref 1500–7800)
Neutrophils Relative %: 36 %
Platelets: 85 10*3/uL — ABNORMAL LOW (ref 140–400)
RBC: 4.42 MIL/uL (ref 4.20–5.80)
RDW: 18.4 % — AB (ref 11.0–15.0)
WBC: 4.4 10*3/uL (ref 3.8–10.8)

## 2017-04-07 LAB — BASIC METABOLIC PANEL
BUN: 12 mg/dL (ref 7–25)
CO2: 24 mmol/L (ref 20–31)
Calcium: 9 mg/dL (ref 8.6–10.3)
Chloride: 104 mmol/L (ref 98–110)
Creat: 0.52 mg/dL — ABNORMAL LOW (ref 0.60–1.35)
Glucose, Bld: 95 mg/dL (ref 65–99)
POTASSIUM: 3.9 mmol/L (ref 3.5–5.3)
Sodium: 135 mmol/L (ref 135–146)

## 2017-04-07 NOTE — Progress Notes (Signed)
RFV: follow up for epidural abscess  Patient ID: [name], male   DOB: Jan 26, 1981, 36 y.o.   MRN: [id number]  HPI  [name] is a 36yo M with history of alcoholic cirrhosis, chronic hepatitis without hepatic coma who was found to have Strep infection of lumbar spine and pyomyositis of right leg and underwent wound debridement x 2 in February 2018.  He was placed on ceftriaxone then and took for 6 weeks with amoxicillin/clav in follow up, which he continues to take.  He had repeat washout on 3/7 and as well as further debridement on 01/06/17 by Dr. Rolena Infante. Repeat OR culture now showed a few C albicans.  He was started on fluconazole 800 mg daily since mid April which he took for roughly 1 month. The patient  continues to have wound vac change 3 times per week and follows with dr Con Memos at wound care center. The measurements of his wound continue to improve. Initially 7.4cm in length, 5.7 cm in width and 6 cm id depth in mid April, now measuring 1.5cm in length, 1.7 cm in width and 1.3 in depth as of 7/11, roughly 3 months of treatment. He is no longer on any antibiotics or antifungals. The wound bed continues to look good.    ROS: He denies fever, chills, nightsweats. occ low back pain, otherwise 12 point ros is negative  Outpatient Encounter Prescriptions as of 04/07/2017  Medication Sig  . amoxicillin-clavulanate (AUGMENTIN) 875-125 MG tablet Take 1 tablet by mouth 2 (two) times daily.  . fluconazole (DIFLUCAN) 200 MG tablet Take 4 tablets (800 mg total) by mouth daily.  . Multiple Vitamin (MULTIVITAMIN WITH MINERALS) TABS tablet Take 1 tablet by mouth daily. (Patient not taking: Reported on 01/19/2017)  . oxyCODONE (OXY IR/ROXICODONE) 5 MG immediate release tablet Take 1 tablet (5 mg total) by mouth every 4 (four) hours as needed for severe pain. (Patient not taking: Reported on 01/19/2017)  . protein supplement shake (PREMIER PROTEIN) LIQD Take 325 mLs (11 oz total) by mouth 2 (two)  times daily between meals.   No facility-administered encounter medications on file as of 04/07/2017.      Patient Active Problem List   Diagnosis Date Noted  . Medication monitoring encounter 01/19/2017  . Weakness of right lower extremity 11/19/2016  . 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 Abnormality of gait   . Post-operative pain   . Infective myositis of right thigh   . Coagulopathy ([place])   . DTs (delirium tremens) ([place])   . Transaminitis   . Infection 11/12/2016  . Alcoholic cirrhosis of liver without ascites ([place])   . Goals of care, counseling/discussion   . Palliative care by specialist   . Pyogenic arthritis of multiple sites ([place])   . Traumatic compartment syndrome of other sites   . Alcoholic liver failure ([place])   . Traumatic compartment syndrome of lower extremity ([place])   . Infection of lumbar spine ([place]) 11/01/2016  . Delirium tremens ([place])   . Abnormal transaminases   . Alcoholic hepatitis without ascites   . ETOH abuse   . Abscess of right lower extremity   . Cellulitis and abscess of right lower extremity   . Hypernatremia   . Cellulitis of right lower extremity 10/30/2016  . EtOH dependence ([place]) 10/30/2016  . Thrombocytopenia ([place]) 10/30/2016  . Cirrhosis ([place])   . Paraspinal abscess ([place])   . Chronic hepatitis C without hepatic coma ([place])   . Hepatitis   . Epidural abscess 10/29/2016  .  Liver failure ([place]) 10/29/2016  . Sepsis ([place]) 10/29/2016  . Hyponatremia 10/29/2016   ROS: occasional back pain. Otherwise 12 point ros is negative  There are no preventive care reminders to display for this patient.   Physical Exam  BP 128/82   Pulse 82   Temp (!) 97.1 F (36.2 C) (Oral)   Wt 254 lb (115.2 kg)   BMI 35.43 kg/m  Physical Exam  Constitutional: He is oriented to person, place, and time. He appears well-developed and well-nourished. No distress.  HENT:  Mouth/Throat: Oropharynx is clear and moist. No oropharyngeal exudate.    Cardiovascular: Normal rate, regular rhythm and normal heart sounds. Exam reveals no gallop and no friction rub.  No murmur heard.  Pulmonary/Chest: Effort normal and breath sounds normal. No respiratory distress. He has no wheezes.  Back: wound vac in lumbar region is functioning. No surrounding erythema. Small patch of abraded skin likely from adhesive injury Neurological: He is alert and oriented to person, place, and time.  Skin: Skin is warm and dry. No rash noted. No erythema.  Psychiatric: He has a normal mood and affect. His behavior is normal.    CBC Lab Results  Component Value Date   WBC 5.3 01/19/2017   RBC 3.78 (L) 01/19/2017   HGB 11.5 (L) 01/19/2017   HCT 33.7 (L) 01/19/2017   PLT 133 (L) 01/19/2017   MCV 89.2 01/19/2017   MCH 30.4 01/19/2017   MCHC 34.1 01/19/2017   RDW 14.9 01/19/2017   LYMPHSABS 1,802 01/19/2017   MONOABS 954 (H) 01/19/2017   EOSABS 212 01/19/2017    BMET Lab Results  Component Value Date   NA 139 01/19/2017   K 4.4 01/19/2017   CL 107 01/19/2017   CO2 27 01/19/2017   GLUCOSE 103 (H) 01/19/2017   BUN 10 01/19/2017   CREATININE 0.55 (L) 01/19/2017   CALCIUM 9.4 01/19/2017   GFRNONAA >60 01/06/2017   GFRAA >60 01/06/2017   Lab Results  Component Value Date   ESRSEDRATE 4 04/07/2017   Lab Results  Component Value Date   CRP 1.4 04/07/2017      Assessment and Plan  Hx of lumbar spine infection - polymicrobial including strep and c.albicans. He has finished course of therapy and continues now to get wound care to help heal his wound. Will check sed rate and crp. Cbc and bmp today to ensure still has low inflammatory markers and to decide whether need to reinitiate abtx. The pictures and measurements brought in by the patient show marked improvement  rtc as prn  Addendum = inflammatory markers are normalized. No need to reinitiate abtx

## 2017-04-08 LAB — SEDIMENTATION RATE: SED RATE: 4 mm/h (ref 0–15)

## 2017-04-08 LAB — C-REACTIVE PROTEIN: CRP: 1.4 mg/L (ref <8.0)

## 2017-04-14 DIAGNOSIS — T8189XA Other complications of procedures, not elsewhere classified, initial encounter: Secondary | ICD-10-CM | POA: Diagnosis not present

## 2017-04-21 DIAGNOSIS — T8189XA Other complications of procedures, not elsewhere classified, initial encounter: Secondary | ICD-10-CM | POA: Diagnosis not present

## 2017-04-28 ENCOUNTER — Encounter (HOSPITAL_BASED_OUTPATIENT_CLINIC_OR_DEPARTMENT_OTHER): Payer: BLUE CROSS/BLUE SHIELD | Attending: Surgery

## 2017-04-28 DIAGNOSIS — T8131XA Disruption of external operation (surgical) wound, not elsewhere classified, initial encounter: Secondary | ICD-10-CM | POA: Insufficient documentation

## 2017-04-28 DIAGNOSIS — Y838 Other surgical procedures as the cause of abnormal reaction of the patient, or of later complication, without mention of misadventure at the time of the procedure: Secondary | ICD-10-CM | POA: Insufficient documentation

## 2017-04-28 DIAGNOSIS — F17210 Nicotine dependence, cigarettes, uncomplicated: Secondary | ICD-10-CM | POA: Diagnosis not present

## 2017-04-28 DIAGNOSIS — B182 Chronic viral hepatitis C: Secondary | ICD-10-CM | POA: Diagnosis not present

## 2017-05-05 DIAGNOSIS — T8131XA Disruption of external operation (surgical) wound, not elsewhere classified, initial encounter: Secondary | ICD-10-CM | POA: Diagnosis not present

## 2017-05-12 DIAGNOSIS — T8131XA Disruption of external operation (surgical) wound, not elsewhere classified, initial encounter: Secondary | ICD-10-CM | POA: Diagnosis not present

## 2017-05-13 ENCOUNTER — Ambulatory Visit: Payer: BLUE CROSS/BLUE SHIELD | Admitting: Internal Medicine

## 2017-05-19 DIAGNOSIS — T8131XA Disruption of external operation (surgical) wound, not elsewhere classified, initial encounter: Secondary | ICD-10-CM | POA: Diagnosis not present

## 2017-05-20 NOTE — Addendum Note (Signed)
Addendum  created 05/20/17 1127 by Roberts Gaudy, MD   Sign clinical note

## 2017-06-08 ENCOUNTER — Ambulatory Visit: Payer: BLUE CROSS/BLUE SHIELD | Admitting: Internal Medicine

## 2017-06-09 ENCOUNTER — Encounter (HOSPITAL_BASED_OUTPATIENT_CLINIC_OR_DEPARTMENT_OTHER): Payer: BLUE CROSS/BLUE SHIELD | Attending: Surgery

## 2017-06-09 DIAGNOSIS — Z872 Personal history of diseases of the skin and subcutaneous tissue: Secondary | ICD-10-CM | POA: Insufficient documentation

## 2017-06-09 DIAGNOSIS — Z09 Encounter for follow-up examination after completed treatment for conditions other than malignant neoplasm: Secondary | ICD-10-CM | POA: Diagnosis present

## 2017-06-15 ENCOUNTER — Encounter: Payer: Self-pay | Admitting: Internal Medicine

## 2017-06-15 ENCOUNTER — Ambulatory Visit (INDEPENDENT_AMBULATORY_CARE_PROVIDER_SITE_OTHER): Payer: BLUE CROSS/BLUE SHIELD | Admitting: Internal Medicine

## 2017-06-15 VITALS — BP 138/88 | HR 68 | Temp 98.2°F | Wt 266.0 lb

## 2017-06-15 DIAGNOSIS — Z23 Encounter for immunization: Secondary | ICD-10-CM | POA: Diagnosis not present

## 2017-06-15 DIAGNOSIS — M4620 Osteomyelitis of vertebra, site unspecified: Secondary | ICD-10-CM | POA: Diagnosis not present

## 2017-06-15 LAB — SEDIMENTATION RATE: Sed Rate: 9 mm/h (ref 0–15)

## 2017-06-15 NOTE — Progress Notes (Signed)
Patient ID: [name], male   DOB: 1981-06-23, 36 y.o.   MRN: [id number]  HPI [name] is a 36yo M with history of alcoholic cirrhosis, chronic hepatitis without hepatic coma who was found to have Strep infection of lumbar spine and pyomyositis of right leg and underwent wound debridement x 2 in February 2018. He was placed on ceftriaxone then and took for 6 weeks with amoxicillin/clav in follow up, which he continues to take. He had repeat washout on 3/7 and as well as further debridement on 01/06/17 by Dr. Rolena Infante. Repeat OR culture now showed a few C albicans. He was started on fluconazole 800 mg daily since mid April which he took for roughly 1 month. The patient at one time had wound vac to treat large wound with measurements  Initially 7.4cm in length, 5.7 cm in width and 6 cm id depth in mid April, now measuring 1.5cm in length, 1.7 cm in width and 1.3 in depth as of 7/11, roughly 3 months of treatment. He is no longer on any antibiotics or antifungals. The wound bed has completely healed and he denies any back pain. He has been released from his wound care doctor last week. He returns today since we last saw him in the beginning of July to observe off of abtx  Appears to be doing well. Has enumerable scratches to his left dorsum of hand from a new kitten - does not look infected  Outpatient Encounter Prescriptions as of 06/15/2017  Medication Sig  . Multiple Vitamin (MULTIVITAMIN WITH MINERALS) TABS tablet Take 1 tablet by mouth daily.  Marland Kitchen oxyCODONE (OXY IR/ROXICODONE) 5 MG immediate release tablet Take 1 tablet (5 mg total) by mouth every 4 (four) hours as needed for severe pain.  . protein supplement shake (PREMIER PROTEIN) LIQD Take 325 mLs (11 oz total) by mouth 2 (two) times daily between meals.   No facility-administered encounter medications on file as of 06/15/2017.      Patient Active Problem List   Diagnosis Date Noted  . Medication monitoring encounter 01/19/2017    . Weakness of right lower extremity 11/19/2016  . 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 Abnormality of gait   . Post-operative pain   . Infective myositis of right thigh   . Coagulopathy ([place])   . DTs (delirium tremens) ([place])   . Transaminitis   . Infection 11/12/2016  . Alcoholic cirrhosis of liver without ascites ([place])   . Goals of care, counseling/discussion   . Palliative care by specialist   . Pyogenic arthritis of multiple sites ([place])   . Traumatic compartment syndrome of other sites   . Alcoholic liver failure ([place])   . Traumatic compartment syndrome of lower extremity ([place])   . Infection of lumbar spine ([place]) 11/01/2016  . Delirium tremens ([place])   . Abnormal transaminases   . Alcoholic hepatitis without ascites   . ETOH abuse   . Abscess of right lower extremity   . Cellulitis and abscess of right lower extremity   . Hypernatremia   . Cellulitis of right lower extremity 10/30/2016  . EtOH dependence ([place]) 10/30/2016  . Thrombocytopenia ([place]) 10/30/2016  . Cirrhosis ([place])   . Paraspinal abscess ([place])   . Chronic hepatitis C without hepatic coma ([place])   . Hepatitis   . Epidural abscess 10/29/2016  . Liver failure ([place]) 10/29/2016  . Sepsis ([place]) 10/29/2016  . Hyponatremia 10/29/2016     Health Maintenance Due  Topic Date Due  . INFLUENZA  VACCINE  04/28/2017     Review of Systems 12 point ros is negative. Otherwise negative Physical Exam   BP 138/88   Pulse 68   Temp 98.2 F (36.8 C) (Oral)   Wt 266 lb (120.7 kg)   BMI 37.10 kg/m   Physical Exam  Constitutional: He is oriented to person, place, and time. He appears well-developed and well-nourished. No distress.  HENT:  Mouth/Throat: Oropharynx is clear and moist. No oropharyngeal exudate.   Neurological: He is alert and oriented to person, place, and time.  Back = well healed incision Skin: Skin is warm and dry. No rash noted. No erythema.  Psychiatric: He has a normal mood and affect. His  behavior is normal.    CBC Lab Results  Component Value Date   WBC 4.4 04/07/2017   RBC 4.42 04/07/2017   HGB 12.2 (L) 04/07/2017   HCT 37.1 (L) 04/07/2017   PLT 85 (L) 04/07/2017   MCV 83.9 04/07/2017   MCH 27.6 04/07/2017   MCHC 32.9 04/07/2017   RDW 18.4 (H) 04/07/2017   LYMPHSABS 1,760 04/07/2017   MONOABS 748 04/07/2017   EOSABS 176 04/07/2017    BMET Lab Results  Component Value Date   NA 135 04/07/2017   K 3.9 04/07/2017   CL 104 04/07/2017   CO2 24 04/07/2017   GLUCOSE 95 04/07/2017   BUN 12 04/07/2017   CREATININE 0.52 (L) 04/07/2017   CALCIUM 9.0 04/07/2017   GFRNONAA >60 01/06/2017   GFRAA >60 01/06/2017    Lab Results  Component Value Date   ESRSEDRATE 4 04/07/2017   Lab Results  Component Value Date   CRP 1.4 04/07/2017     Assessment and Plan  Paraspinal abscess= finished course of the therapy. Wound has compeltely healed. Inflammatory markers have normalized at last visit. We will repeat tests to ensure still WNL  Health maintenance = will give flu vaccine today  Rtc prn

## 2017-11-10 ENCOUNTER — Emergency Department (HOSPITAL_COMMUNITY): Payer: BLUE CROSS/BLUE SHIELD

## 2017-11-10 ENCOUNTER — Encounter (HOSPITAL_COMMUNITY): Payer: Self-pay | Admitting: Emergency Medicine

## 2017-11-10 ENCOUNTER — Other Ambulatory Visit: Payer: Self-pay

## 2017-11-10 ENCOUNTER — Emergency Department (HOSPITAL_COMMUNITY)
Admission: EM | Admit: 2017-11-10 | Discharge: 2017-11-10 | Disposition: A | Discharge status: Home / Self Care | Payer: BLUE CROSS/BLUE SHIELD | Attending: Emergency Medicine | Admitting: Emergency Medicine

## 2017-11-10 DIAGNOSIS — Z87891 Personal history of nicotine dependence: Secondary | ICD-10-CM | POA: Diagnosis not present

## 2017-11-10 DIAGNOSIS — K529 Noninfective gastroenteritis and colitis, unspecified: Secondary | ICD-10-CM | POA: Insufficient documentation

## 2017-11-10 DIAGNOSIS — R1110 Vomiting, unspecified: Secondary | ICD-10-CM | POA: Diagnosis present

## 2017-11-10 LAB — COMPREHENSIVE METABOLIC PANEL
ALBUMIN: 3.9 g/dL (ref 3.5–5.0)
ALK PHOS: 84 U/L (ref 38–126)
ALT: 64 U/L — ABNORMAL HIGH (ref 17–63)
ANION GAP: 17 — AB (ref 5–15)
AST: 133 U/L — ABNORMAL HIGH (ref 15–41)
BUN: 10 mg/dL (ref 6–20)
CALCIUM: 9.3 mg/dL (ref 8.9–10.3)
CO2: 24 mmol/L (ref 22–32)
Chloride: 95 mmol/L — ABNORMAL LOW (ref 101–111)
Creatinine, Ser: 0.93 mg/dL (ref 0.61–1.24)
GFR calc Af Amer: >60 mL/min (ref >60)
GFR calc non Af Amer: >60 mL/min (ref >60)
GLUCOSE: 125 mg/dL — AB (ref 65–99)
Potassium: 3.3 mmol/L — ABNORMAL LOW (ref 3.5–5.1)
SODIUM: 136 mmol/L (ref 135–145)
Total Bilirubin: 4.8 mg/dL — ABNORMAL HIGH (ref 0.3–1.2)
Total Protein: 8.7 g/dL — ABNORMAL HIGH (ref 6.5–8.1)

## 2017-11-10 LAB — URINALYSIS, ROUTINE W REFLEX MICROSCOPIC
Glucose, UA: NEGATIVE mg/dL
Hgb urine dipstick: NEGATIVE
KETONES UR: 20 mg/dL — AB
Leukocytes, UA: NEGATIVE
Nitrite: NEGATIVE
PROTEIN: 30 mg/dL — AB
Specific Gravity, Urine: 1.029 (ref 1.005–1.030)
pH: 5 (ref 5.0–8.0)

## 2017-11-10 LAB — CBC
HEMATOCRIT: 44 % (ref 39.0–52.0)
HEMOGLOBIN: 15.3 g/dL (ref 13.0–17.0)
MCH: 33.3 pg (ref 26.0–34.0)
MCHC: 34.8 g/dL (ref 30.0–36.0)
MCV: 95.9 fL (ref 78.0–100.0)
Platelets: 52 10*3/uL — ABNORMAL LOW (ref 150–400)
RBC: 4.59 MIL/uL (ref 4.22–5.81)
RDW: 14.9 % (ref 11.5–15.5)
WBC: 6.8 10*3/uL (ref 4.0–10.5)

## 2017-11-10 LAB — ETHANOL

## 2017-11-10 LAB — LIPASE, BLOOD: Lipase: 70 U/L — ABNORMAL HIGH (ref 11–51)

## 2017-11-10 MED ORDER — IOPAMIDOL (ISOVUE-300) INJECTION 61%
INTRAVENOUS | Status: AC
  Administered 2017-11-10: 100 mL
  Filled 2017-11-10: qty 100

## 2017-11-10 MED ORDER — PANTOPRAZOLE SODIUM 40 MG IV SOLR
40.0000 mg | Freq: Once | INTRAVENOUS | Status: AC
  Administered 2017-11-10: 40 mg via INTRAVENOUS
  Filled 2017-11-10: qty 40

## 2017-11-10 MED ORDER — SODIUM CHLORIDE 0.9 % IV SOLN: INTRAVENOUS | Status: DC

## 2017-11-10 MED ORDER — PROMETHAZINE HCL 25 MG PO TABS: 25.0000 mg | ORAL_TABLET | Freq: Four times a day (QID) | ORAL | 0 refills | Status: DC | PRN

## 2017-11-10 MED ORDER — SODIUM CHLORIDE 0.9 % IV BOLUS (SEPSIS)
2000.0000 mL | Freq: Once | INTRAVENOUS | Status: AC
  Administered 2017-11-10: 2000 mL via INTRAVENOUS

## 2017-11-10 MED ORDER — ONDANSETRON HCL 4 MG/2ML IJ SOLN
4.0000 mg | Freq: Once | INTRAMUSCULAR | Status: AC
  Administered 2017-11-10: 4 mg via INTRAVENOUS
  Filled 2017-11-10: qty 2

## 2017-11-10 MED ORDER — ONDANSETRON 4 MG PO TBDP: 4.0000 mg | ORAL_TABLET | Freq: Three times a day (TID) | ORAL | 1 refills | Status: DC | PRN

## 2017-11-10 NOTE — ED Triage Notes (Signed)
Pt states he is been vomiting since last Sunday not able to eat or drink nothing, generalized body ache 8/10.

## 2017-11-10 NOTE — ED Notes (Signed)
ED Provider at bedside. 

## 2017-11-10 NOTE — Discharge Instructions (Signed)
Take the antinausea medicine as directed.  Return for any new or worse symptoms.  CT scan of the abdomen today without any significant findings.  Symptoms could be associated with some mild pancreatitis.  Would recommend clear liquid diet for the next 24 hours.  Work note provided.

## 2017-11-10 NOTE — ED Notes (Signed)
Pt returned from CT at this time.  

## 2017-11-10 NOTE — ED Provider Notes (Addendum)
[facility] EMERGENCY DEPARTMENT Provider Note   CSN: [id number] Arrival date & time: 11/10/17  2229     History   Chief Complaint Chief Complaint  Patient presents with  . Emesis    HPI [name] is a 37 y.o. male.  Patient presents with a complaint of nausea and vomiting since Sunday and occasional loose bowel movement.  Not able to eat or drink anything generalized body aches 8 out of 10.  No vomiting of blood.  Past history of alcohol abuse.      Past Medical History:  Diagnosis Date  . Alcoholic cirrhosis ([place])   . Cellulitis   . Cellulitis 10/2016   right leg  . Elevated liver function tests 10/2016  . 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 ETOH abuse   . Hepatitis C    Hepatitis C  . Paraspinal abscess ([place])   . Thrombocytopenia ([place]) 25/2018    Patient Active Problem List   Diagnosis Date Noted  . Medication monitoring encounter 01/19/2017  . Weakness of right lower extremity 11/19/2016  . 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 Abnormality of gait   . Post-operative pain   . Infective myositis of right thigh   . Coagulopathy ([place])   . DTs (delirium tremens) ([place])   . Transaminitis   . Infection 11/12/2016  . Alcoholic cirrhosis of liver without ascites ([place])   . Goals of care, counseling/discussion   . Palliative care by specialist   . Pyogenic arthritis of multiple sites ([place])   . Traumatic compartment syndrome of other sites   . 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 Traumatic compartment syndrome of lower extremity ([place])   . 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 Delirium tremens ([place])   . Abnormal transaminases   . Alcoholic hepatitis without ascites   . ETOH abuse   . Abscess of right lower extremity   . Cellulitis and abscess of right lower extremity   . Hypernatremia   . Cellulitis of right lower extremity 10/30/2016  . EtOH dependence ([place]) 10/30/2016  . Thrombocytopenia ([place]) 10/30/2016  .  Cirrhosis ([place])   . Paraspinal abscess ([place])   . Chronic hepatitis C without hepatic coma ([place])   . Hepatitis   . Epidural abscess 10/29/2016  . Liver failure ([place]) 10/29/2016  . Sepsis ([place]) 10/29/2016  . Hyponatremia 10/29/2016    Past Surgical History:  Procedure Laterality Date  . DECOMPRESSIVE LUMBAR LAMINECTOMY LEVEL 1 N/A 11/01/2016   Procedure: DECOMPRESSIVE POSTERIOR LUMBAR LAMINECTOMY LEVEL 4-5;  Surgeon: Melina Schools, MD;  Location: [facility];  Service: Orthopedics;  Laterality: N/A;  . HERNIA REPAIR Left    INGUNIAL  . I&D EXTREMITY Right 11/01/2016   Procedure: IRRIGATION AND DEBRIDEMENT ABSCESS RIGHT THIGH;  Surgeon: Melina Schools, MD;  Location: [facility];  Service: Orthopedics;  Laterality: Right;  . INCISION AND DRAINAGE OF WOUND N/A 01/06/2017   Procedure: IRRIGATION AND DEBRIDEMENT and placement of wound vac;  Surgeon: Melina Schools, MD;  Location: [facility];  Service: Orthopedics;  Laterality: N/A;  . INCISION AND DRAINAGE OF WOUND N/A 12/02/2016   Procedure: WASHOUT AND CLOSURE OF WOUND;  Surgeon: Melina Schools, MD;  Location: [facility];  Service: Orthopedics;  Laterality: N/A;  . LUMBAR WOUND DEBRIDEMENT N/A 11/12/2016   Procedure: LUMBAR WOUND DEBRIDEMENT;  Surgeon: Melina Schools, MD;  Location: [facility];  Service: Orthopedics;  Laterality: N/A;       Home Medications    Prior to Admission medications  Medication Sig Start Date End Date Taking? Authorizing Provider  Multiple Vitamin (MULTIVITAMIN WITH MINERALS) TABS tablet Take 1 tablet by mouth daily. Patient not taking: Reported on 11/10/2017 11/17/16   Elgergawy, Silver Huguenin, MD  ondansetron (ZOFRAN ODT) 4 MG disintegrating tablet Take 1 tablet (4 mg total) by mouth every 8 (eight) hours as needed for nausea or vomiting. 11/10/17   Fredia Sorrow, MD  oxyCODONE (OXY IR/ROXICODONE) 5 MG immediate release tablet Take 1 tablet (5 mg total) by mouth every 4 (four) hours as needed for severe pain. Patient not taking: Reported on  11/10/2017 01/06/17   Melina Schools, MD  promethazine (PHENERGAN) 25 MG tablet Take 1 tablet (25 mg total) by mouth every 6 (six) hours as needed for nausea or vomiting. 11/10/17   Fredia Sorrow, MD  protein supplement shake (PREMIER PROTEIN) LIQD Take 325 mLs (11 oz total) by mouth 2 (two) times daily between meals. Patient not taking: Reported on 11/10/2017 11/17/16   Elgergawy, Silver Huguenin, MD    Family History Family History  Problem Relation Age of Onset  . Diabetes Mother     Social History Social History   Tobacco Use  . Smoking status: Former Smoker    Last attempt to quit: 01/04/2017    Years since quitting: 0.8  . Smokeless tobacco: Never Used  . Tobacco comment: was a social smoker- maybe 5 a week  Substance Use Topics  . Alcohol use: Yes    Comment: none since 09/27/16  . Drug use: No    Comment: as a teen a few times     Allergies   Acetaminophen   Review of Systems Review of Systems  Constitutional: Negative for fever.  HENT: Negative for congestion.   Eyes: Negative for visual disturbance.  Respiratory: Negative for shortness of breath.   Cardiovascular: Negative for chest pain.  Gastrointestinal: Positive for abdominal pain, nausea and vomiting.  Genitourinary: Negative for dysuria.  Musculoskeletal: Positive for myalgias. Negative for back pain.  Skin: Negative for rash.  Neurological: Negative for headaches.  Hematological: Does not bruise/bleed easily.  Psychiatric/Behavioral: Negative for confusion.     Physical Exam Updated Vital Signs BP (!) 121/57   Pulse 66   Temp 98.1 F (36.7 C) (Oral)   Resp 16   Ht 1.803 m (5\' 11" )   Wt 120.7 kg (266 lb)   SpO2 98%   BMI 37.10 kg/m   Physical Exam  Constitutional: He is oriented to person, place, and time. He appears well-developed and well-nourished. No distress.  HENT:  Head: Normocephalic and atraumatic.  Mucous membranes dry  Eyes: Conjunctivae and EOM are normal. Pupils are equal, round,  and reactive to light.  Neck: Normal range of motion. Neck supple.  Cardiovascular: Normal rate, regular rhythm and normal heart sounds.  Pulmonary/Chest: Effort normal and breath sounds normal. No respiratory distress.  Abdominal: Soft. Bowel sounds are normal. There is tenderness.  Some generalized tenderness of the abdomen no guarding  Musculoskeletal: Normal range of motion.  Neurological: He is alert and oriented to person, place, and time. No cranial nerve deficit or sensory deficit. He exhibits normal muscle tone. Coordination normal.  Skin: Skin is warm.  Nursing note and vitals reviewed.    ED Treatments / Results  Labs (all labs ordered are listed, but only abnormal results are displayed) Labs Reviewed  LIPASE, BLOOD - Abnormal; Notable for the following components:      Result Value   Lipase 70 (*)    All other  components within normal limits  COMPREHENSIVE METABOLIC PANEL - Abnormal; Notable for the following components:   Potassium 3.3 (*)    Chloride 95 (*)    Glucose, Bld 125 (*)    Total Protein 8.7 (*)    AST 133 (*)    ALT 64 (*)    Total Bilirubin 4.8 (*)    Anion gap 17 (*)    All other components within normal limits  CBC - Abnormal; Notable for the following components:   Platelets 52 (*)    All other components within normal limits  URINALYSIS, ROUTINE W REFLEX MICROSCOPIC - Abnormal; Notable for the following components:   Color, Urine AMBER (*)    APPearance HAZY (*)    Bilirubin Urine SMALL (*)    Ketones, ur 20 (*)    Protein, ur 30 (*)    Bacteria, UA RARE (*)    Squamous Epithelial / LPF 0-5 (*)    All other components within normal limits  ETHANOL    EKG  EKG Interpretation None       Radiology Ct Abdomen Pelvis W Contrast  Result Date: 11/10/2017 CLINICAL DATA:  Vomiting. Gastroenteritis. Generalized body aches. History of ETOH abuse. History of hepatitis-C. History of alcoholic cirrhosis. EXAM: CT ABDOMEN AND PELVIS WITH CONTRAST  TECHNIQUE: Multidetector CT imaging of the abdomen and pelvis was performed using the standard protocol following bolus administration of intravenous contrast. CONTRAST:  170mL ISOVUE-300 IOPAMIDOL (ISOVUE-300) INJECTION 61% COMPARISON:  Ultrasound of the abdomen 10/29/2016. FINDINGS: Lower chest: No acute abnormality. Hepatobiliary: Hepatic steatosis. Mild gallbladder wall thickening noted previously. No visible calculi. Pancreas: Unremarkable. No pancreatic ductal dilatation or surrounding inflammatory changes. Spleen: Mild splenomegaly.  No focal abnormality. Adrenals/Urinary Tract: Adrenal glands are unremarkable. Kid no acute intra-abdominal or pelvic abnormality. Kidneys are normal, without renal calculi, focal lesion, or hydronephrosis. Bladder is unremarkable. Stomach/Bowel: Stomach is within normal limits. Appendix appears normal. No evidence of bowel wall thickening, distention, or inflammatory changes. Uncomplicated appearing sigmoid diverticulosis. Vascular/Lymphatic: No significant vascular findings are present. No enlarged abdominal or pelvic lymph nodes. Reproductive: Prostate is unremarkable. Other: Small umbilical hernia contains only fat.  No free fluid. Musculoskeletal: No acute or significant osseous findings. Prior RIGHT laminectomy at L4-5. IMPRESSION: No evidence for bowel obstruction or other acute findings. Hepatic steatosis, mild gallbladder wall thickening similar to prior ultrasound. Electronically Signed   By: Staci Righter M.D.   On: 11/10/2017 14:34    Procedures Procedures (including critical care time)  Medications Ordered in ED Medications  0.9 %  sodium chloride infusion ( Intravenous Not Given 11/10/17 0900)  sodium chloride 0.9 % bolus 2,000 mL (0 mLs Intravenous Stopped 11/10/17 1100)  ondansetron (ZOFRAN) injection 4 mg (4 mg Intravenous Given 11/10/17 0900)  pantoprazole (PROTONIX) injection 40 mg (40 mg Intravenous Given 11/10/17 0900)  iopamidol (ISOVUE-300) 61 %  injection (100 mLs  Contrast Given 11/10/17 1413)     Initial Impression / Assessment and Plan / ED Course  I have reviewed the triage vital signs and the nursing notes.  Pertinent labs & imaging results that were available during my care of the patient were reviewed by me and considered in my medical decision making (see chart for details).     Patient's urine very concentrated.  Patient feels better after 2 L of normal saline hydration.  Also received Protonix and antinausea medicine.  Labs with mild elevation in the lipase.  CT scan of the abdomen without any acute findings.  Patient known to  have a history of cirrhosis.  Does have elevated bilirubin.  However CT scan shows no change in the gallbladder area.  Patient does have primary care doctor for follow-up.  Symptoms could be related to some mild pancreatitis.  Patient states that he is followed by GI medicine.  Had labs done by them a few days ago.  Has an upper endoscopy scheduled next week so the liver function test will be continued to be followed by them.  Final Clinical Impressions(s) / ED Diagnoses   Final diagnoses:  Gastroenteritis    ED Discharge Orders        Ordered    ondansetron (ZOFRAN ODT) 4 MG disintegrating tablet  Every 8 hours PRN     11/10/17 1522    promethazine (PHENERGAN) 25 MG tablet  Every 6 hours PRN     11/10/17 1522       Fredia Sorrow, MD 11/10/17 1528    Fredia Sorrow, MD 11/10/17 1541

## 2017-11-11 HISTORY — PX: ESOPHAGOGASTRODUODENOSCOPY: SHX1529

## 2018-03-16 IMAGING — MR MR [PERSON_NAME] LOW WO/W CM*R*
4 of 9 series · 19 of 40 positions shown · IV contrast (multihance)
Comparison: None.

CLINICAL DATA: Severe right lower extremity pain and swelling.
History of diskitis and osteomyelitis.

EXAM:
MRI OF THE RIGHT FEMUR WITHOUT AND WITH CONTRAST; MRI OF LOWER RIGHT
EXTREMITY WITHOUT AND WITH CONTRAST
TECHNIQUE: Multiplanar, multisequence MR imaging of the right lower extremity
was performed both before and after administration of intravenous
contrast.
CONTRAST:  20mL MULTIHANCE GADOBENATE DIMEGLUMINE 529 MG/ML IV SOLN

[Series 5: T1 · coronal · 5.0mm · 0.98mm/px · 2 of 26 slices shown (1 of 2)]
[im 1/26]
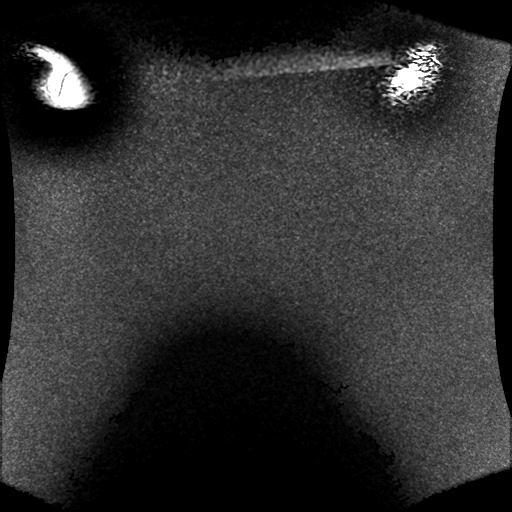
[im 26/26]
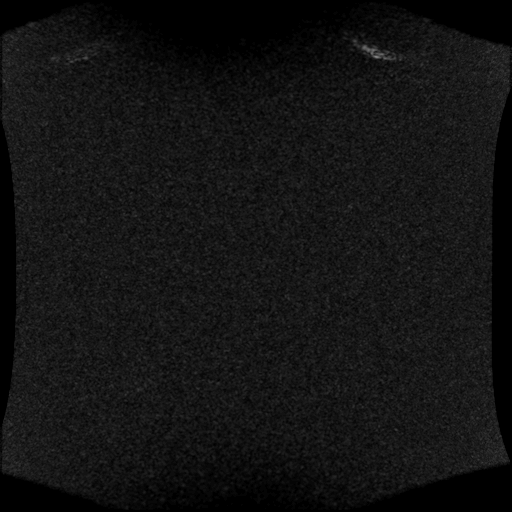

[Series 6: T1 · axial · 5.0mm · 0.98mm/px · z∈[-285,+213]mm · 7 of 78 slices shown (2 of 2)]
[im 1/78]
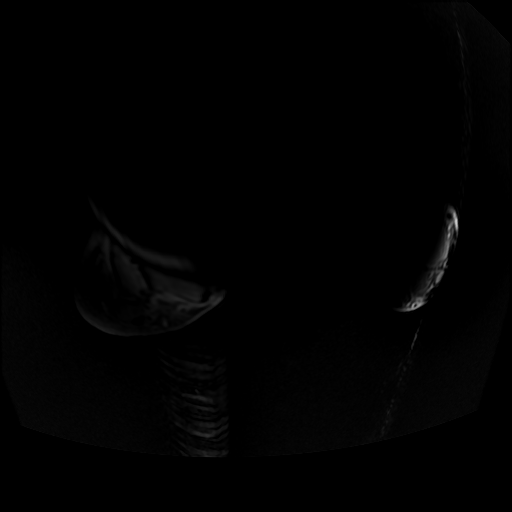
[im 13/78]
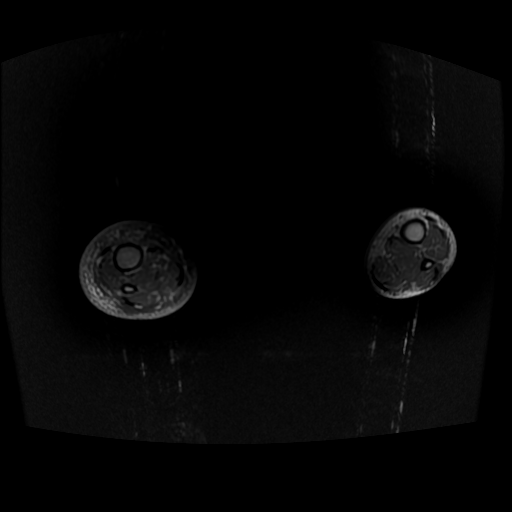
[im 26/78]
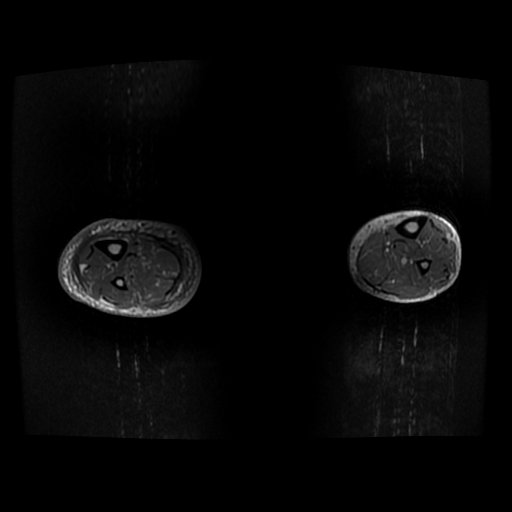
[im 39/78]
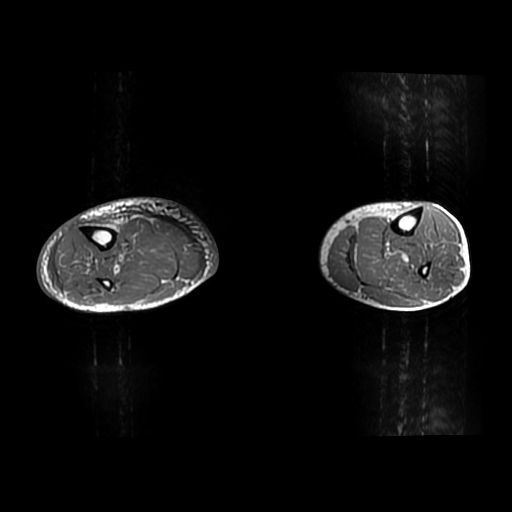
[im 52/78]
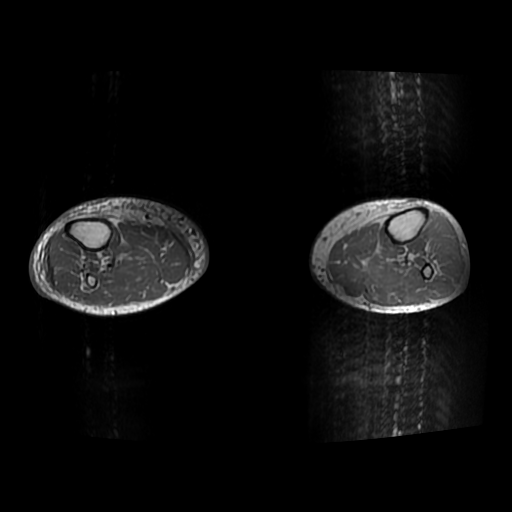
[im 65/78]
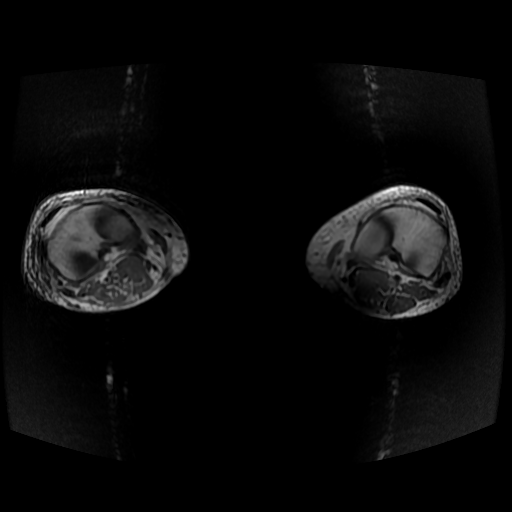
[im 78/78]
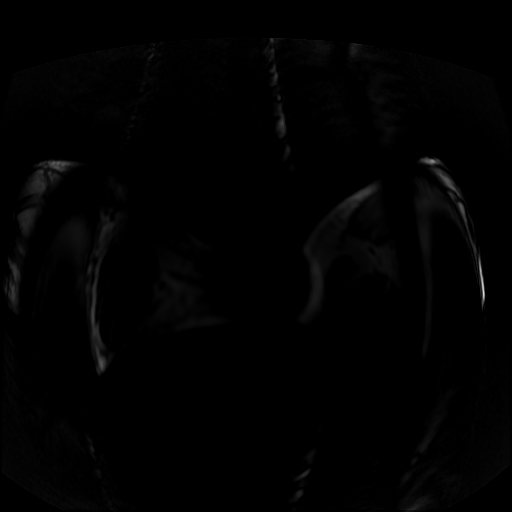

[Series 7: T1 fat-sat · axial · non-contrast · 5.0mm · 0.98mm/px · z∈[-205,+127]mm · 3 of 80 slices shown]
[im 14/80]
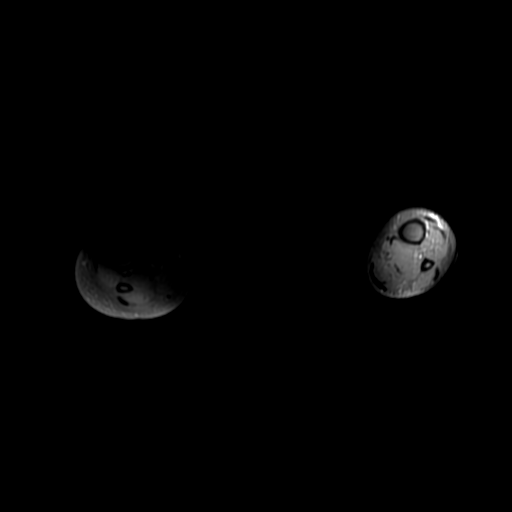
[im 40/80]
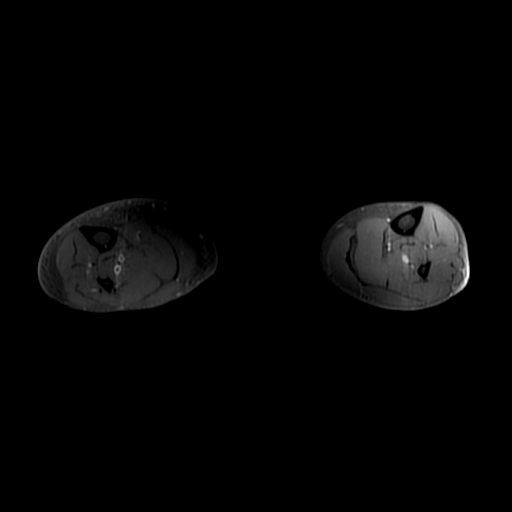
[im 66/80]
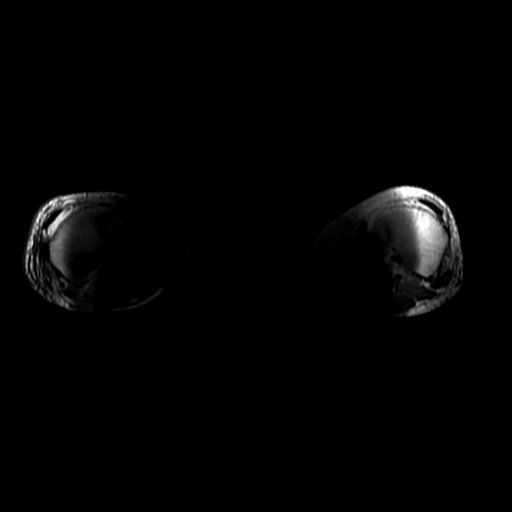

[Series 8: T2 fat-sat · axial · 5.0mm · 0.98mm/px · z∈[-288,+217]mm · 7 of 80 slices shown]
[im 1/80]
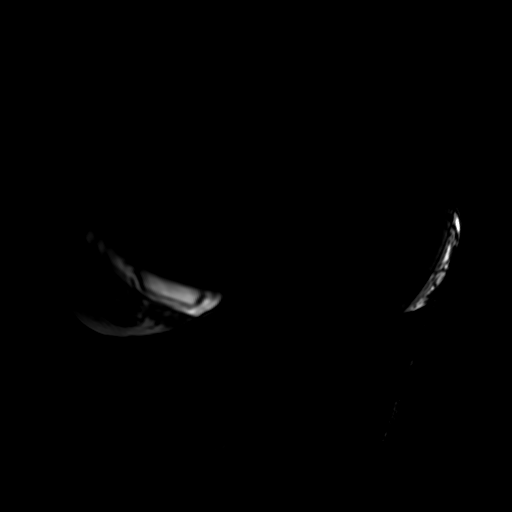
[im 14/80]
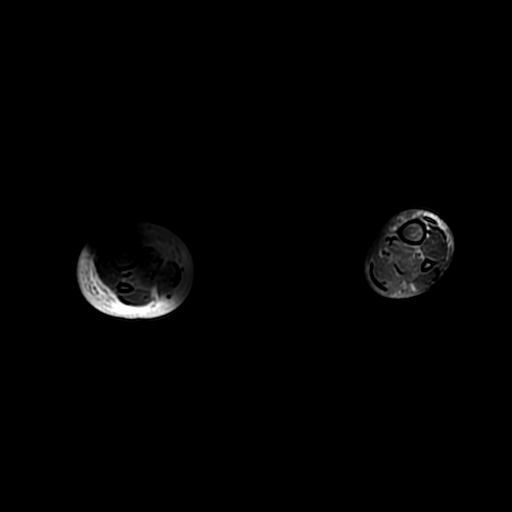
[im 27/80]
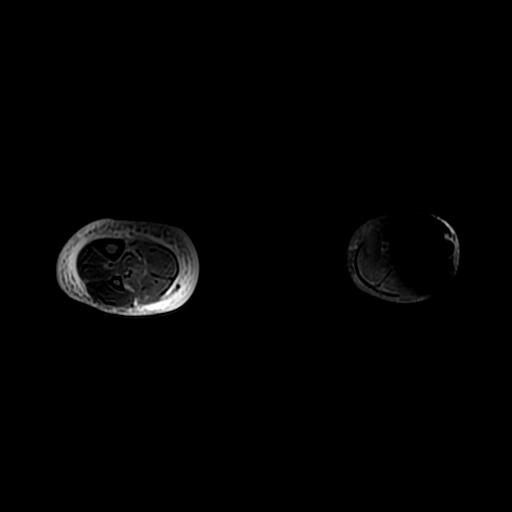
[im 40/80]
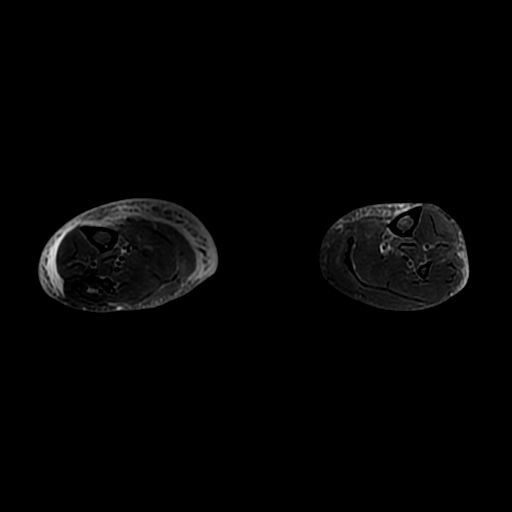
[im 53/80]
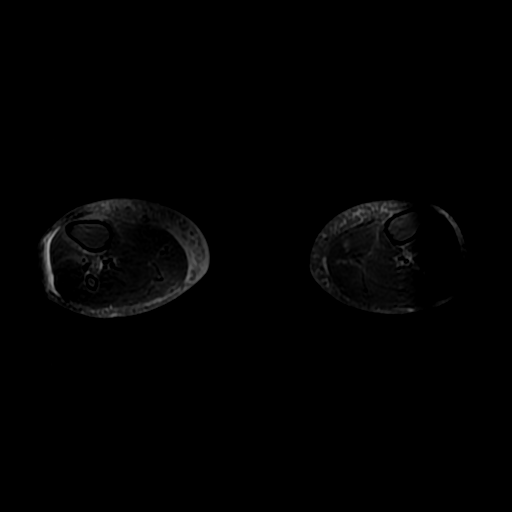
[im 66/80]
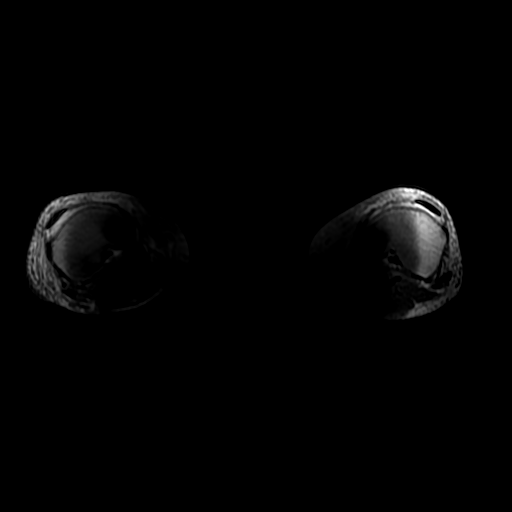
[im 80/80]
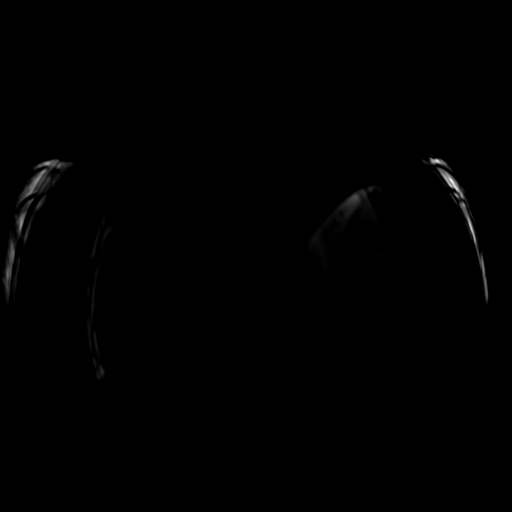

[19 of 40 positions shown; findings below may reference images not displayed]

FINDINGS: The hip and knee joints are grossly maintained. No definite findings
for septic arthritis or osteomyelitis.

There is extensive pyomyositis involving the posterior compartment
of the thigh. Multiple large abscesses surrounding the hamstring
muscles. This also rim enhancing fluid/ abscess along the entire
course of the sciatic/femoral nerve which is markedly inflamed.

Diffuse subcutaneous soft tissue swelling/ edema involving both
lower extremities, right greater than left consistent with
cellulitis.

I do not see any significant findings below-the-knee other than
cellulitis. No myofasciitis or pyomyositis. No osteomyelitis or
septic arthritis.
IMPRESSION: 1. Extensive abscesses involving the posterior compartment of the
thigh along with myofasciitis and cellulitis. There is also abscess
all along the course of the sciatic/femoral nerve which is thickened
and inflamed.
2. No findings for septic arthritis or osteomyelitis.
3. Cellulitis below the knee but no myofasciitis or pyomyositis.
These results will be called to the ordering clinician or
representative by the Radiologist Assistant, and communication
documented in the PACS or zVision Dashboard.

## 2018-04-12 DIAGNOSIS — Z8679 Personal history of other diseases of the circulatory system: Secondary | ICD-10-CM

## 2018-05-21 IMAGING — US US ABDOMEN LIMITED
1 series · 14 of 25 positions shown · non-contrast
Comparison: None.

CLINICAL DATA: Jaundice.  History of hepatitis-C.

EXAM:
US ABDOMEN LIMITED - RIGHT UPPER QUADRANT

[Series 1: us abdomen limited · 0.25mm/px · 14 of 51 slices shown]
[im 1/51]
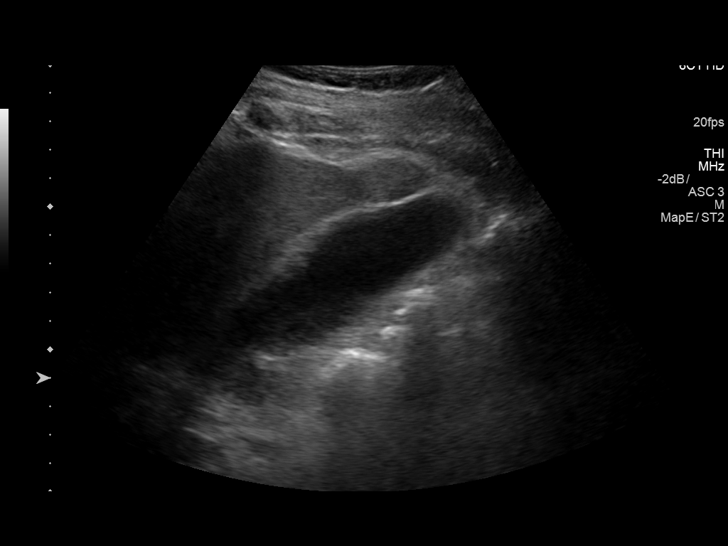
[im 5/51]
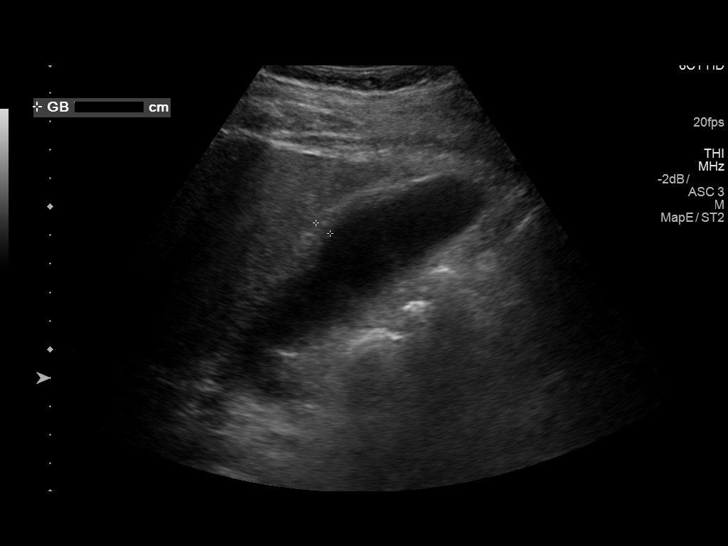
[im 9/51]
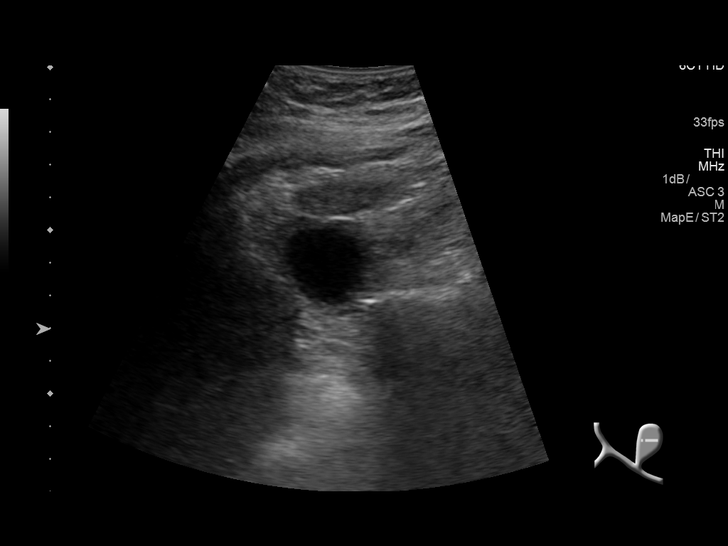
[im 13/51]
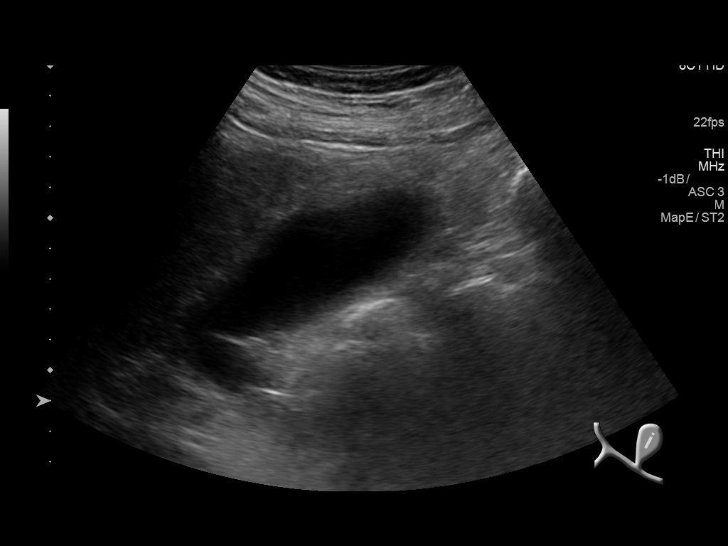
[im 17/51]
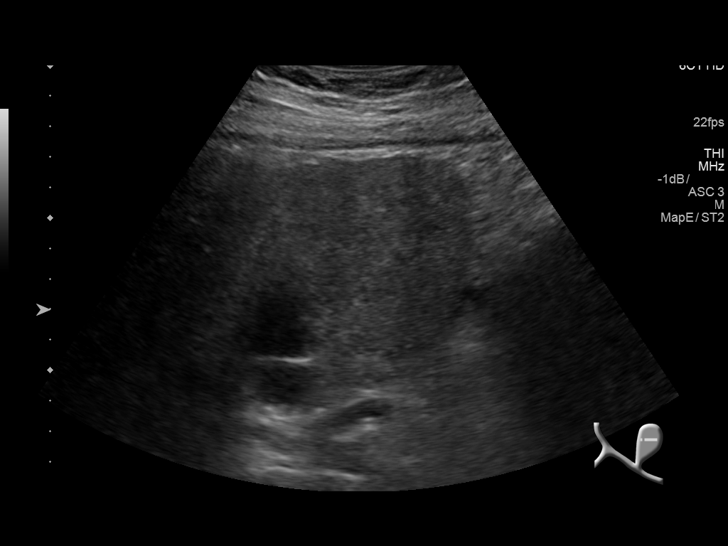
[im 19/51]
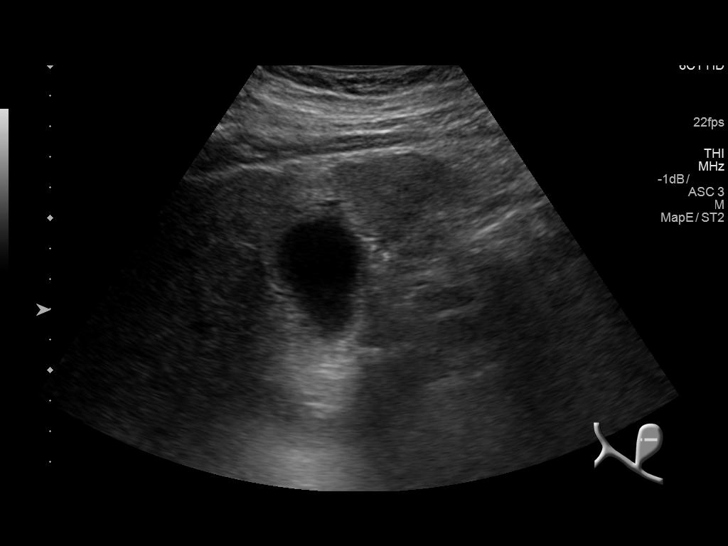
[im 23/51]
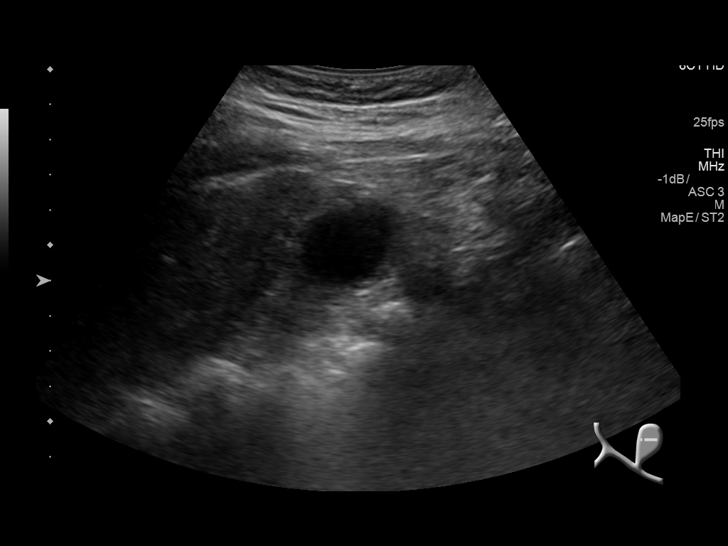
[im 28/51]
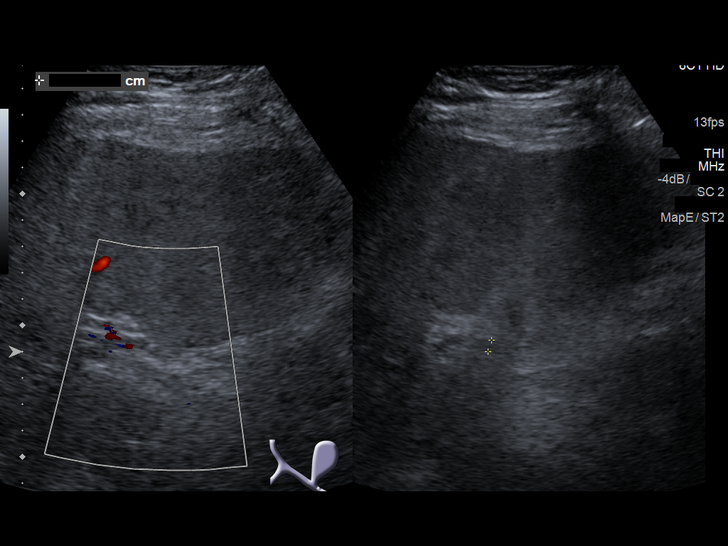
[im 32/51]
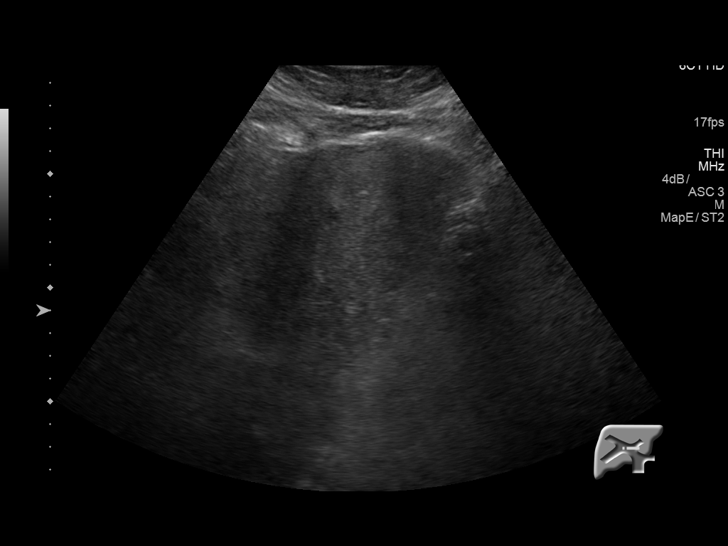
[im 34/51]
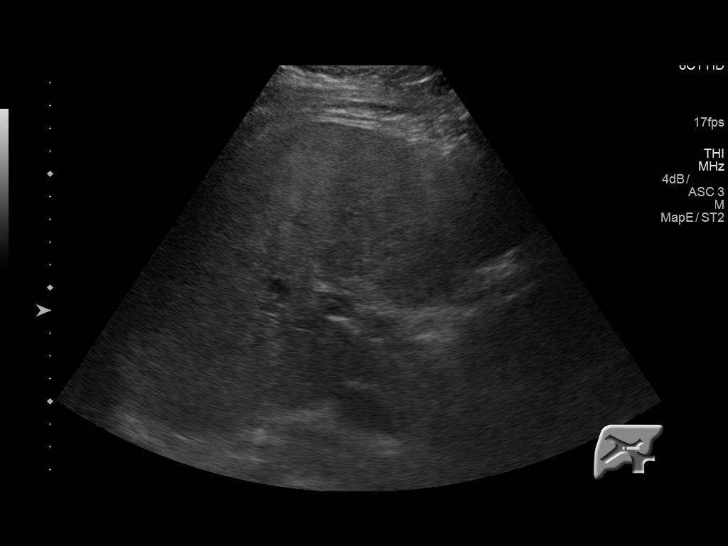
[im 38/51]
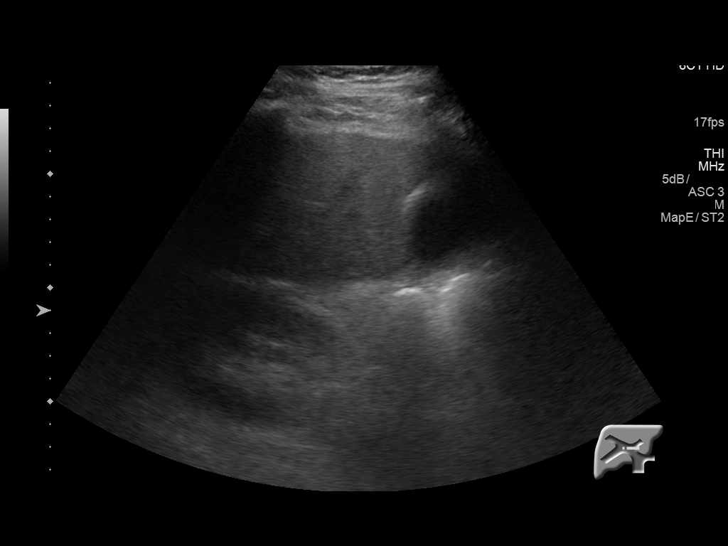
[im 42/51]
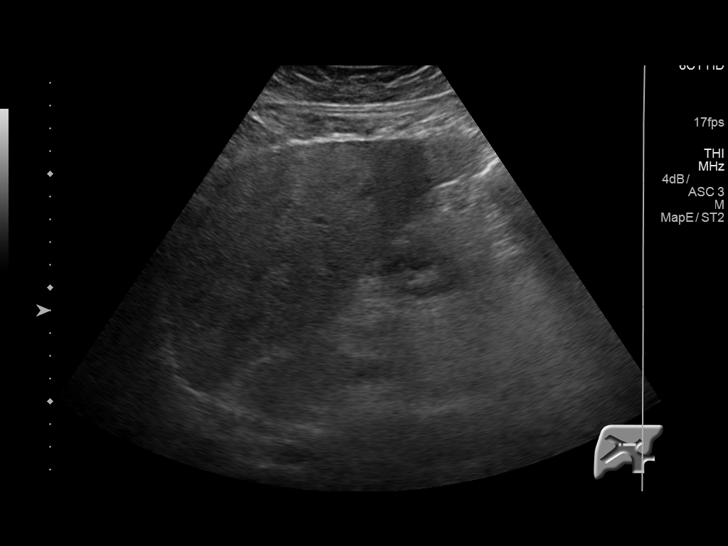
[im 46/51]
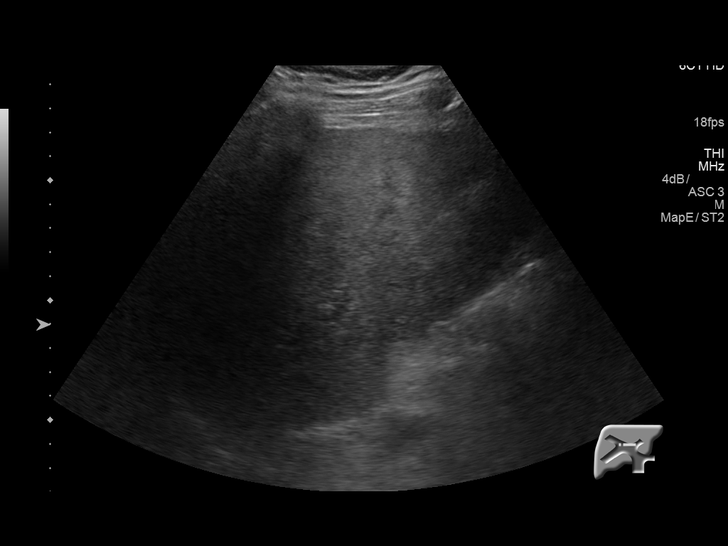
[im 51/51]
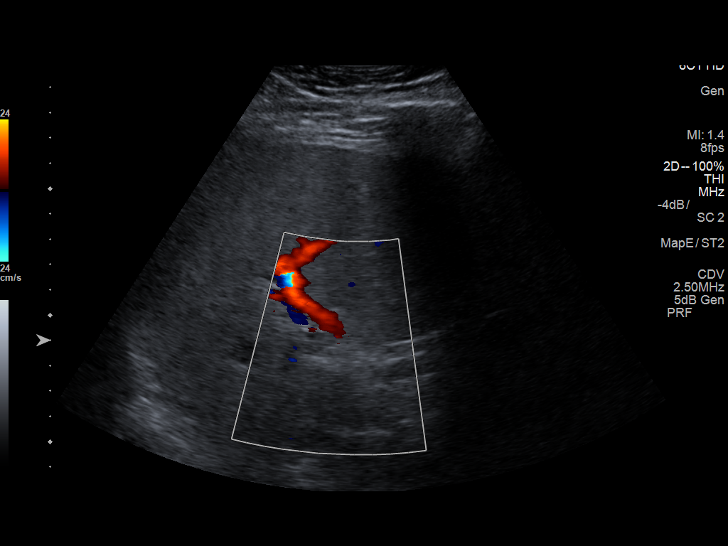

[14 of 25 positions shown; findings below may reference images not displayed]

FINDINGS: Gallbladder:

Moderate gallbladder mural thickening, measuring 6 mm. No calculi.
No tenderness over the gallbladder.

Common bile duct:

Diameter: Normal, 4.5 mm.

Liver:

Diffusely increased echogenicity without focal lesion.
IMPRESSION: 1. Diffuse hepatic parenchymal echogenicity suggesting steatosis or
other hepatocellular disease.
2. Gallbladder mural thickening without calculi or Murphy's sign.
This can be seen with hepatocellular disease or portal hypertension.
3. No bile duct dilatation.
4. No ascites in the right upper quadrant.

## 2018-07-28 ENCOUNTER — Encounter (HOSPITAL_COMMUNITY): Payer: Self-pay

## 2018-07-28 ENCOUNTER — Emergency Department (HOSPITAL_COMMUNITY)
Admission: EM | Admit: 2018-07-28 | Discharge: 2018-07-28 | Disposition: A | Discharge status: Home / Self Care | Payer: BLUE CROSS/BLUE SHIELD | Attending: Emergency Medicine | Admitting: Emergency Medicine

## 2018-07-28 ENCOUNTER — Other Ambulatory Visit: Payer: Self-pay

## 2018-07-28 DIAGNOSIS — Z87891 Personal history of nicotine dependence: Secondary | ICD-10-CM | POA: Insufficient documentation

## 2018-07-28 DIAGNOSIS — E876 Hypokalemia: Secondary | ICD-10-CM | POA: Insufficient documentation

## 2018-07-28 DIAGNOSIS — Z79899 Other long term (current) drug therapy: Secondary | ICD-10-CM | POA: Insufficient documentation

## 2018-07-28 DIAGNOSIS — K7030 Alcoholic cirrhosis of liver without ascites: Secondary | ICD-10-CM | POA: Insufficient documentation

## 2018-07-28 DIAGNOSIS — F10230 Alcohol dependence with withdrawal, uncomplicated: Secondary | ICD-10-CM | POA: Insufficient documentation

## 2018-07-28 LAB — CBC
HCT: 43.9 % (ref 39.0–52.0)
Hemoglobin: 14.3 g/dL (ref 13.0–17.0)
MCH: 31.5 pg (ref 26.0–34.0)
MCHC: 32.6 g/dL (ref 30.0–36.0)
MCV: 96.7 fL (ref 80.0–100.0)
Platelets: 32 10*3/uL — ABNORMAL LOW (ref 150–400)
RBC: 4.54 MIL/uL (ref 4.22–5.81)
RDW: 13.6 % (ref 11.5–15.5)
WBC: 4.8 10*3/uL (ref 4.0–10.5)
nRBC: 0 % (ref 0.0–0.2)

## 2018-07-28 LAB — RAPID URINE DRUG SCREEN, HOSP PERFORMED
Amphetamines: NOT DETECTED
Barbiturates: NOT DETECTED
Benzodiazepines: POSITIVE — AB
Cocaine: NOT DETECTED
Opiates: NOT DETECTED
Tetrahydrocannabinol: NOT DETECTED

## 2018-07-28 LAB — COMPREHENSIVE METABOLIC PANEL
ALT: 73 U/L — ABNORMAL HIGH (ref 0–44)
AST: 162 U/L — ABNORMAL HIGH (ref 15–41)
Albumin: 3.7 g/dL (ref 3.5–5.0)
Alkaline Phosphatase: 92 U/L (ref 38–126)
Anion gap: 11 (ref 5–15)
BUN: 5 mg/dL — ABNORMAL LOW (ref 6–20)
CO2: 27 mmol/L (ref 22–32)
Calcium: 10 mg/dL (ref 8.9–10.3)
Chloride: 97 mmol/L — ABNORMAL LOW (ref 98–111)
Creatinine, Ser: 0.79 mg/dL (ref 0.61–1.24)
GFR calc Af Amer: >60 mL/min (ref >60)
GFR calc non Af Amer: >60 mL/min (ref >60)
Glucose, Bld: 169 mg/dL — ABNORMAL HIGH (ref 70–99)
Potassium: 3 mmol/L — ABNORMAL LOW (ref 3.5–5.1)
Sodium: 135 mmol/L (ref 135–145)
Total Bilirubin: 5.5 mg/dL — ABNORMAL HIGH (ref 0.3–1.2)
Total Protein: 8.4 g/dL — ABNORMAL HIGH (ref 6.5–8.1)

## 2018-07-28 LAB — LIPASE, BLOOD: Lipase: 56 U/L — ABNORMAL HIGH (ref 11–51)

## 2018-07-28 LAB — ETHANOL: Alcohol, Ethyl (B): <10 mg/dL (ref <10)

## 2018-07-28 MED ORDER — POTASSIUM CHLORIDE CRYS ER 20 MEQ PO TBCR
40.0000 meq | EXTENDED_RELEASE_TABLET | Freq: Once | ORAL | Status: AC
  Administered 2018-07-28: 40 meq via ORAL
  Filled 2018-07-28: qty 2

## 2018-07-28 MED ORDER — POTASSIUM CHLORIDE CRYS ER 20 MEQ PO TBCR: 20.0000 meq | EXTENDED_RELEASE_TABLET | Freq: Every day | ORAL | 0 refills | Status: DC

## 2018-07-28 MED ORDER — CLONIDINE HCL 0.1 MG PO TABS
0.10 | ORAL_TABLET | ORAL | Status: DC
Start: ? — End: 2018-07-28

## 2018-07-28 MED ORDER — LORAZEPAM 1 MG PO TABS
1.0000 mg | ORAL_TABLET | Freq: Once | ORAL | Status: AC
Start: 2018-07-28 — End: 2018-07-28
  Administered 2018-07-28: 1 mg via ORAL
  Filled 2018-07-28: qty 1

## 2018-07-28 MED ORDER — CHLORDIAZEPOXIDE HCL 25 MG PO CAPS: ORAL_CAPSULE | ORAL | 0 refills | Status: DC

## 2018-07-28 MED ORDER — GENERIC EXTERNAL MEDICATION
4.00 | Status: DC
Start: ? — End: 2018-07-28

## 2018-07-28 NOTE — ED Provider Notes (Signed)
[name] Provider Note   CSN: [id number] Arrival date & time: 07/28/18  1226     History   Chief Complaint Chief Complaint  Patient presents with  . Alcohol Problem    HPI [name] is a 37 y.o. male senting for evaluation of alcohol withdrawal.  Patient states he has a history of alcohol dependence/abuse and withdrawal.  Patient states he last drink on Sunday.  He was seen at another emergency room on Tuesday for nausea, vomiting, and dehydration.  He was rehydrated, given Ativan, and discharged with resources for outpatient follow-up.  He has not attempted to follow-up outpatient yet.  Patient's nausea and vomiting has resolved.  He is tolerating p.o. without difficulty.  Patient is here today, because he is concerned that he may need hospitalization for further Ativan dosing.  He denies seizures, tremors, feeling like his heart is racing.  Per patient's brother, patient had reported hallucinations this morning, which resolved without intervention.  Patient denies hallucinations at this time.  Patient states he has a history of DTs with alcohol withdrawal, but no history of seizures with alcohol withdrawal.  Patient with additional history of hep C.   Additional history obtained from chart review, patient seen at [facility] ER 2 days ago.  Labs from visit 2 days ago reviewed.  Patient admitted several months ago for a whole withdrawal with delirium.   HPI  Past Medical History:  Diagnosis Date  . Alcoholic cirrhosis ([place])   . Cellulitis   . Cellulitis 10/2016   right leg  . Elevated liver function tests 10/2016  . 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 ETOH abuse   . Hepatitis C    Hepatitis C  . Paraspinal abscess ([place])   . Thrombocytopenia ([place]) 25/2018    Patient Active Problem List   Diagnosis Date Noted  . Medication monitoring encounter 01/19/2017  . Weakness of right lower extremity 11/19/2016  . Epidural  intraspinal abscess 11/16/2016  . Abnormality of gait   . Post-operative pain   . Infective myositis of right thigh   . Coagulopathy ([place])   . DTs (delirium tremens) ([place])   . Transaminitis   . Infection 11/12/2016  . Alcoholic cirrhosis of liver without ascites ([place])   . Goals of care, counseling/discussion   . Palliative care by specialist   . 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 Traumatic compartment syndrome of other sites   . 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 Traumatic compartment syndrome of lower extremity ([place])   . 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 Delirium tremens ([place])   . Abnormal transaminases   . Alcoholic hepatitis without ascites   . ETOH abuse   . Abscess of right lower extremity   . Cellulitis and abscess of right lower extremity   . Hypernatremia   . Cellulitis of right lower extremity 10/30/2016  . EtOH dependence ([place]) 10/30/2016  . Thrombocytopenia ([place]) 10/30/2016  . Cirrhosis ([place])   . Paraspinal abscess ([place])   . Chronic hepatitis C without hepatic coma ([place])   . Hepatitis   . Epidural abscess 10/29/2016  . Liver failure ([place]) 10/29/2016  . Sepsis ([place]) 10/29/2016  . Hyponatremia 10/29/2016    Past Surgical History:  Procedure Laterality Date  . DECOMPRESSIVE LUMBAR LAMINECTOMY LEVEL 1 N/A 11/01/2016   Procedure: DECOMPRESSIVE POSTERIOR LUMBAR LAMINECTOMY LEVEL 4-5;  Surgeon: Melina Schools, MD;  Location: [place];  Service: Orthopedics;  Laterality: N/A;  .  HERNIA REPAIR Left    INGUNIAL  . I&D EXTREMITY Right 11/01/2016   Procedure: IRRIGATION AND DEBRIDEMENT ABSCESS RIGHT THIGH;  Surgeon: Melina Schools, MD;  Location: [place];  Service: Orthopedics;  Laterality: Right;  . INCISION AND DRAINAGE OF WOUND N/A 01/06/2017   Procedure: IRRIGATION AND DEBRIDEMENT and placement of wound vac;  Surgeon: Melina Schools, MD;  Location: [place];  Service: Orthopedics;  Laterality: N/A;  . INCISION AND DRAINAGE OF WOUND N/A 12/02/2016   Procedure:  WASHOUT AND CLOSURE OF WOUND;  Surgeon: Melina Schools, MD;  Location: [place];  Service: Orthopedics;  Laterality: N/A;  . LUMBAR WOUND DEBRIDEMENT N/A 11/12/2016   Procedure: LUMBAR WOUND DEBRIDEMENT;  Surgeon: Melina Schools, MD;  Location: [place];  Service: Orthopedics;  Laterality: N/A;        Home Medications    Prior to Admission medications   Medication Sig Start Date End Date Taking? Authorizing Provider  LORazepam (ATIVAN) 0.5 MG tablet Take 5 mg by mouth at bedtime as needed.   Yes [provider]  ondansetron (ZOFRAN ODT) 4 MG disintegrating tablet Take 1 tablet (4 mg total) by mouth every 8 (eight) hours as needed for nausea or vomiting. 11/10/17  Yes Fredia Sorrow, MD  chlordiazePOXIDE (LIBRIUM) 25 MG capsule 50mg  PO TID x 1D, then 25-50mg  PO BID X 1D, then 25-50mg  PO QD X 1D 07/28/18   Eugune Sine, PA-C  Multiple Vitamin (MULTIVITAMIN WITH MINERALS) TABS tablet Take 1 tablet by mouth daily. Patient not taking: Reported on 11/10/2017 11/17/16   Elgergawy, Silver Huguenin, MD  oxyCODONE (OXY IR/ROXICODONE) 5 MG immediate release tablet Take 1 tablet (5 mg total) by mouth every 4 (four) hours as needed for severe pain. Patient not taking: Reported on 11/10/2017 01/06/17   Melina Schools, MD  potassium chloride SA (K-DUR,KLOR-CON) 20 MEQ tablet Take 1 tablet (20 mEq total) by mouth daily for 7 days. 07/28/18 08/04/18  Demetrus Pavao, PA-C  promethazine (PHENERGAN) 25 MG tablet Take 1 tablet (25 mg total) by mouth every 6 (six) hours as needed for nausea or vomiting. Patient not taking: Reported on 07/28/2018 11/10/17   Fredia Sorrow, MD  protein supplement shake (PREMIER PROTEIN) LIQD Take 325 mLs (11 oz total) by mouth 2 (two) times daily between meals. Patient not taking: Reported on 11/10/2017 11/17/16   Elgergawy, Silver Huguenin, MD    Family History Family History  Problem Relation Age of Onset  . Diabetes Mother     Social History Social History   Tobacco Use  .  Smoking status: Former Smoker    Last attempt to quit: 01/04/2017    Years since quitting: 1.5  . Smokeless tobacco: Never Used  . Tobacco comment: was a social smoker- maybe 5 a week  Substance Use Topics  . Alcohol use: Yes    Comment: none since 09/27/16  . Drug use: No    Comment: as a teen a few times     Allergies   Acetaminophen   Review of Systems Review of Systems  Gastrointestinal: Positive for vomiting (resolved).  Psychiatric/Behavioral: Positive for hallucinations (resolved/denies).  All other systems reviewed and are negative.    Physical Exam Updated Vital Signs BP 136/88   Pulse 98   Temp 99.1 F (37.3 C) (Oral)   Resp (!) 22   Ht 5\' 11"  (1.803 m)   Wt 113.4 kg   SpO2 100%   BMI 34.87 kg/m   Physical Exam  Constitutional: He is oriented to person, place, and  time. He appears well-developed and well-nourished. No distress.  Appears in no distress  HENT:  Head: Normocephalic and atraumatic.  Eyes: Pupils are equal, round, and reactive to light. Conjunctivae and EOM are normal.  Twitching of eyelids, baseline per pt. EOMI and PERRLA  Neck: Normal range of motion. Neck supple.  Cardiovascular: Normal rate, regular rhythm and intact distal pulses.  Pulmonary/Chest: Effort normal and breath sounds normal. No respiratory distress. He has no wheezes.  Speaking in full sentences.  Clear lung sounds in all fields  Abdominal: Soft. He exhibits no distension and no mass. There is no tenderness. There is no rebound and no guarding.  No tenderness palpation of the abdomen.  Soft without rigidity, guarding, distention.  Negative rebound.  Musculoskeletal: Normal range of motion.  Strength intact x4.  Sensation intact x4.  Radial and pedal pulses intact bilaterally.  Neurological: He is alert and oriented to person, place, and time. No sensory deficit. GCS eye subscore is 4. GCS verbal subscore is 5. GCS motor subscore is 6.  No tremors noted.  No obvious  neurologic deficits.  CN intact.  Nose to finger intact.  Grip strength intact.  Skin: Skin is warm and dry. Capillary refill takes less than 2 seconds.  Psychiatric: He has a normal mood and affect. His mood appears not anxious. He is not agitated and not actively hallucinating.  Not obviously responding to internal stimuli.  Patient denying hallucinations at this time. No obvious agitation or anxiety.   Nursing note and vitals reviewed.    ED Treatments / Results  Labs (all labs ordered are listed, but only abnormal results are displayed) Labs Reviewed  COMPREHENSIVE METABOLIC PANEL - Abnormal; Notable for the following components:      Result Value   Potassium 3.0 (*)    Chloride 97 (*)    Glucose, Bld 169 (*)    BUN 5 (*)    Total Protein 8.4 (*)    AST 162 (*)    ALT 73 (*)    Total Bilirubin 5.5 (*)    All other components within normal limits  CBC - Abnormal; Notable for the following components:   Platelets 32 (*)    All other components within normal limits  RAPID URINE DRUG SCREEN, HOSP PERFORMED - Abnormal; Notable for the following components:   Benzodiazepines POSITIVE (*)    All other components within normal limits  LIPASE, BLOOD - Abnormal; Notable for the following components:   Lipase 56 (*)    All other components within normal limits  ETHANOL    EKG None  Radiology No results found.  Procedures Procedures (including critical care time)  Medications Ordered in ED Medications  LORazepam (ATIVAN) tablet 1 mg (1 mg Oral Given 07/28/18 1447)  potassium chloride SA (K-DUR,KLOR-CON) CR tablet 40 mEq (40 mEq Oral Given 07/28/18 1524)     Initial Impression / Assessment and Plan / ED Course  I have reviewed the triage vital signs and the nursing notes.  Pertinent labs & imaging results that were available during my care of the patient were reviewed by me and considered in my medical decision making (see chart for details).     Pt presenting for  evaluation of alcohol withdrawal.  Physical exam reassuring, he is afebrile not tachycardic.  He appears nontoxic.  No tremors.  He is not obviously responding to internal stimuli. N/v has resolved. abd exam benign. Doubt DTs.   Labs show improvement from 2 days ago.  Lipase improving.  Liver function improving.  Potassium low, replenish orally.  Bili mildly elevated, baseline.  UDS with benzos, has been taking Ativan.  CBC reassuring, no leukocytosis.  Hemoglobin stable. Pt given 1 dose of PO ativan.   Pt remained stable while obvs in ED. No further tachycardia. CIWA 4. Discussed with attending, Dr. Wilson Singer agrees to plan. Will d/c pt with Librium taper.  Discussed importance of alcohol cessation.  Discussed importance of follow-up with outpatient resources.  Patient given information again that he was given with no font, as well as resources in the [employer]. Peer support ordered.  Return precautions given, including worsening signs of withdrawal/DTs.  At this time, patient proceed for discharge.  Patient and family state they understand and agree to plan.  Final Clinical Impressions(s) / ED Diagnoses   Final diagnoses:  Alcohol dependence with uncomplicated withdrawal ([place])  Hypokalemia    ED Discharge Orders         Ordered    chlordiazePOXIDE (LIBRIUM) 25 MG capsule  Status:  Discontinued     07/28/18 1451    potassium chloride SA (K-DUR,KLOR-CON) 20 MEQ tablet  Daily,   Status:  Discontinued     07/28/18 1453    chlordiazePOXIDE (LIBRIUM) 25 MG capsule     07/28/18 1542    potassium chloride SA (K-DUR,KLOR-CON) 20 MEQ tablet  Daily     07/28/18 1542           Novalee Horsfall, PA-C 07/28/18 1625    Virgel Manifold, MD 07/28/18 1626

## 2018-07-28 NOTE — ED Notes (Signed)
Patient verbalizes understanding of discharge instructions. Opportunity for questioning and answers were provided. Armband removed by staff, pt discharged from ED. Pt offered wheelchair to lobby, pt requested to walk. Pt left with family.

## 2018-07-28 NOTE — ED Triage Notes (Signed)
Pt presents for evaluation of alcohol withdrawal. States was seen at [facility] ED on Tuesday and given ativan, states was told he needed an infusion but was sent home on Wednesday. Pt states ongoing symptoms of withdrawal. Denies ETOH use since Sunday.

## 2018-07-28 NOTE — Discharge Instructions (Addendum)
°  [facility] Address: [place], [place], [place] [address] Phone: [phone]  [facility]. Address: [address], [place], [place] [address] Phone: [phone]  [facility]) Address: [place], [place], [place] [address] Phone: [phone]   Electronically signed by Lorayne Bender, [place] at 07/27/2018 3:21 PM EDT     Take librium as prescribed. Take potassium daily. Make sure you are staying well-hydrated with water. Stop drinking alcohol. Follow-up with your primary care doctor as needed for further evaluation. Contact the residential and outpatient substance abuse programs for further assistance. Return to the emergency room if you develop hallucinations, persistent shakes, persistent vomiting, seizures, or any new or concerning symptoms.

## 2018-09-29 ENCOUNTER — Emergency Department (HOSPITAL_COMMUNITY)
Admission: EM | Admit: 2018-09-29 | Discharge: 2018-09-29 | Disposition: A | Discharge status: Home / Self Care | Payer: No Typology Code available for payment source | Attending: Emergency Medicine | Admitting: Emergency Medicine

## 2018-09-29 ENCOUNTER — Encounter (HOSPITAL_COMMUNITY): Payer: Self-pay

## 2018-09-29 ENCOUNTER — Other Ambulatory Visit: Payer: Self-pay

## 2018-09-29 ENCOUNTER — Emergency Department (HOSPITAL_COMMUNITY): Payer: No Typology Code available for payment source

## 2018-09-29 DIAGNOSIS — Z79899 Other long term (current) drug therapy: Secondary | ICD-10-CM | POA: Diagnosis not present

## 2018-09-29 DIAGNOSIS — M48061 Spinal stenosis, lumbar region without neurogenic claudication: Secondary | ICD-10-CM | POA: Diagnosis not present

## 2018-09-29 DIAGNOSIS — M5489 Other dorsalgia: Secondary | ICD-10-CM | POA: Diagnosis present

## 2018-09-29 DIAGNOSIS — M545 Low back pain, unspecified: Secondary | ICD-10-CM

## 2018-09-29 DIAGNOSIS — Z87891 Personal history of nicotine dependence: Secondary | ICD-10-CM | POA: Insufficient documentation

## 2018-09-29 LAB — BASIC METABOLIC PANEL
Anion gap: 10 (ref 5–15)
BUN: 9 mg/dL (ref 6–20)
CO2: 26 mmol/L (ref 22–32)
Calcium: 9.6 mg/dL (ref 8.9–10.3)
Chloride: 101 mmol/L (ref 98–111)
Creatinine, Ser: 0.61 mg/dL (ref 0.61–1.24)
GFR calc Af Amer: >60 mL/min (ref >60)
GFR calc non Af Amer: >60 mL/min (ref >60)
Glucose, Bld: 115 mg/dL — ABNORMAL HIGH (ref 70–99)
Potassium: 3.8 mmol/L (ref 3.5–5.1)
Sodium: 137 mmol/L (ref 135–145)

## 2018-09-29 LAB — CBC WITH DIFFERENTIAL/PLATELET
Abs Immature Granulocytes: 0.02 10*3/uL (ref 0.00–0.07)
Basophils Absolute: 0.1 10*3/uL (ref 0.0–0.1)
Basophils Relative: 2 %
Eosinophils Absolute: 0.2 10*3/uL (ref 0.0–0.5)
Eosinophils Relative: 3 %
HCT: 46.8 % (ref 39.0–52.0)
Hemoglobin: 15.1 g/dL (ref 13.0–17.0)
Immature Granulocytes: 0 %
Lymphocytes Relative: 27 %
Lymphs Abs: 1.3 10*3/uL (ref 0.7–4.0)
MCH: 31.8 pg (ref 26.0–34.0)
MCHC: 32.3 g/dL (ref 30.0–36.0)
MCV: 98.5 fL (ref 80.0–100.0)
Monocytes Absolute: 0.7 10*3/uL (ref 0.1–1.0)
Monocytes Relative: 15 %
Neutro Abs: 2.6 10*3/uL (ref 1.7–7.7)
Neutrophils Relative %: 53 %
Platelets: 52 10*3/uL — ABNORMAL LOW (ref 150–400)
RBC: 4.75 MIL/uL (ref 4.22–5.81)
RDW: 13.5 % (ref 11.5–15.5)
WBC: 5 10*3/uL (ref 4.0–10.5)
nRBC: 0 % (ref 0.0–0.2)

## 2018-09-29 LAB — C-REACTIVE PROTEIN: CRP: <0.8 mg/dL (ref <1.0)

## 2018-09-29 LAB — SEDIMENTATION RATE: Sed Rate: 5 mm/hr (ref 0–16)

## 2018-09-29 MED ORDER — METHOCARBAMOL 500 MG PO TABS: 500.0000 mg | ORAL_TABLET | Freq: Two times a day (BID) | ORAL | 0 refills | Status: DC

## 2018-09-29 MED ORDER — GADOBUTROL 1 MMOL/ML IV SOLN
10.0000 mL | Freq: Once | INTRAVENOUS | Status: AC | PRN
  Administered 2018-09-29: 10 mL via INTRAVENOUS

## 2018-09-29 MED ORDER — FENTANYL CITRATE (PF) 100 MCG/2ML IJ SOLN
50.0000 ug | Freq: Once | INTRAMUSCULAR | Status: AC
  Administered 2018-09-29: 50 ug via INTRAVENOUS
  Filled 2018-09-29: qty 2

## 2018-09-29 NOTE — ED Triage Notes (Signed)
Pt presents with right lower back pain starting 1.5wks ago.  Pt reports sharp shooting radiating pain down the back of right leg.  Pt denies numbness and tingling.  Reports doing some lifting at work.

## 2018-09-29 NOTE — ED Provider Notes (Addendum)
[facility] EMERGENCY DEPARTMENT Provider Note   CSN: [id number] Arrival date & time: 09/29/18  8676     History   Chief Complaint Chief Complaint  Patient presents with  . Back Pain    HPI [name] is a 38 y.o. male.  HPI   38 year old male with a past medical history of alcoholic cirrhosis, chronic hepatitis without hepatic coma, strep infection of lumbar spine and pyomyositis of the right leg that underwent wound debridement x2 in February 2018 presents today with complaints of back pain.  Patient had complicated course with ongoing infection including Candida albicans.    Patient notes approximate 1.5 weeks ago he developed a sharp pain in his right lower posterior back, he notes the pain radiates down the back of the leg, is slightly worse with straight leg but is worse with movements of the lower spine.  Patient notes this feels similar to previous episode of back pain that resulted in infection.  Patient denies any fever nausea vomiting, denies any loss of distal sensation strength and motor function.  Patient has not taken any medication for the pain prior to arrival.  Patient denies any IV drug use, reports that he drinks occasionally.      Past Medical History:  Diagnosis Date  . Alcoholic cirrhosis ([place])   . Cellulitis   . Cellulitis 10/2016   right leg  . Elevated liver function tests 10/2016  . 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 ETOH abuse   . Hepatitis C    Hepatitis C  . Paraspinal abscess ([place])   . Thrombocytopenia ([place]) 25/2018    Patient Active Problem List   Diagnosis Date Noted  . Medication monitoring encounter 01/19/2017  . Weakness of right lower extremity 11/19/2016  . 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 Abnormality of gait   . Post-operative pain   . Infective myositis of right thigh   . Coagulopathy ([place])   . DTs (delirium tremens) ([place])   . Transaminitis   . Infection 11/12/2016  . Alcoholic  cirrhosis of liver without ascites ([place])   . Goals of care, counseling/discussion   . Palliative care by specialist   . Pyogenic arthritis of multiple sites ([place])   . Traumatic compartment syndrome of other sites   . 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 Traumatic compartment syndrome of lower extremity ([place])   . Infection of lumbar spine ([place]) 11/01/2016  . Delirium tremens ([place])   . Abnormal transaminases   . Alcoholic hepatitis without ascites   . ETOH abuse   . Abscess of right lower extremity   . Cellulitis and abscess of right lower extremity   . Hypernatremia   . Cellulitis of right lower extremity 10/30/2016  . EtOH dependence ([place]) 10/30/2016  . Thrombocytopenia ([place]) 10/30/2016  . Cirrhosis ([place])   . Paraspinal abscess ([place])   . Chronic hepatitis C without hepatic coma ([place])   . Hepatitis   . Epidural abscess 10/29/2016  . Liver failure ([place]) 10/29/2016  . Sepsis ([place]) 10/29/2016  . Hyponatremia 10/29/2016    Past Surgical History:  Procedure Laterality Date  . DECOMPRESSIVE LUMBAR LAMINECTOMY LEVEL 1 N/A 11/01/2016   Procedure: DECOMPRESSIVE POSTERIOR LUMBAR LAMINECTOMY LEVEL 4-5;  Surgeon: Melina Schools, MD;  Location: [facility];  Service: Orthopedics;  Laterality: N/A;  . HERNIA REPAIR Left    INGUNIAL  . I&D EXTREMITY Right 11/01/2016   Procedure: IRRIGATION AND DEBRIDEMENT ABSCESS RIGHT THIGH;  Surgeon: Melina Schools, MD;  Location: [facility]  OR;  Service: Orthopedics;  Laterality: Right;  . INCISION AND DRAINAGE OF WOUND N/A 01/06/2017   Procedure: IRRIGATION AND DEBRIDEMENT and placement of wound vac;  Surgeon: Melina Schools, MD;  Location: [facility];  Service: Orthopedics;  Laterality: N/A;  . INCISION AND DRAINAGE OF WOUND N/A 12/02/2016   Procedure: WASHOUT AND CLOSURE OF WOUND;  Surgeon: Melina Schools, MD;  Location: [facility];  Service: Orthopedics;  Laterality: N/A;  . LUMBAR WOUND DEBRIDEMENT N/A 11/12/2016   Procedure: LUMBAR WOUND DEBRIDEMENT;  Surgeon: Melina Schools, MD;   Location: [facility];  Service: Orthopedics;  Laterality: N/A;        Home Medications    Prior to Admission medications   Medication Sig Start Date End Date Taking? Authorizing Provider  chlordiazePOXIDE (LIBRIUM) 25 MG capsule 50mg  PO TID x 1D, then 25-50mg  PO BID X 1D, then 25-50mg  PO QD X 1D 07/28/18   Caccavale, Sophia, PA-C  LORazepam (ATIVAN) 0.5 MG tablet Take 5 mg by mouth at bedtime as needed.    [provider]  methocarbamol (ROBAXIN) 500 MG tablet Take 1 tablet (500 mg total) by mouth 2 (two) times daily. 09/29/18   Auriana Scalia, Dellis Filbert, PA-C  Multiple Vitamin (MULTIVITAMIN WITH MINERALS) TABS tablet Take 1 tablet by mouth daily. Patient not taking: Reported on 11/10/2017 11/17/16   Elgergawy, Silver Huguenin, MD  ondansetron (ZOFRAN ODT) 4 MG disintegrating tablet Take 1 tablet (4 mg total) by mouth every 8 (eight) hours as needed for nausea or vomiting. 11/10/17   Fredia Sorrow, MD  oxyCODONE (OXY IR/ROXICODONE) 5 MG immediate release tablet Take 1 tablet (5 mg total) by mouth every 4 (four) hours as needed for severe pain. Patient not taking: Reported on 11/10/2017 01/06/17   Melina Schools, MD  potassium chloride SA (K-DUR,KLOR-CON) 20 MEQ tablet Take 1 tablet (20 mEq total) by mouth daily for 7 days. 07/28/18 08/04/18  Caccavale, Sophia, PA-C  promethazine (PHENERGAN) 25 MG tablet Take 1 tablet (25 mg total) by mouth every 6 (six) hours as needed for nausea or vomiting. Patient not taking: Reported on 07/28/2018 11/10/17   Fredia Sorrow, MD  protein supplement shake (PREMIER PROTEIN) LIQD Take 325 mLs (11 oz total) by mouth 2 (two) times daily between meals. Patient not taking: Reported on 11/10/2017 11/17/16   Elgergawy, Silver Huguenin, MD    Family History Family History  Problem Relation Age of Onset  . Diabetes Mother     Social History Social History   Tobacco Use  . Smoking status: Former Smoker    Last attempt to quit: 01/04/2017    Years since quitting: 1.7  . Smokeless  tobacco: Never Used  . Tobacco comment: was a social smoker- maybe 5 a week  Substance Use Topics  . Alcohol use: Yes    Comment: occasionally  . Drug use: No    Comment: as a teen a few times     Allergies   Acetaminophen   Review of Systems Review of Systems  All other systems reviewed and are negative.    Physical Exam Updated Vital Signs BP (!) 148/78 (BP Location: Right Arm)   Pulse 73   Temp 98.2 F (36.8 C) (Oral)   Resp 18   Ht 5\' 11"  (1.803 m)   Wt 113.4 kg   SpO2 98%   BMI 34.87 kg/m   Physical Exam Vitals signs and nursing note reviewed.  Constitutional:      Appearance: He is well-developed.  HENT:     Head: Normocephalic  and atraumatic.  Eyes:     General: No scleral icterus.       Right eye: No discharge.        Left eye: No discharge.     Conjunctiva/sclera: Conjunctivae normal.     Pupils: Pupils are equal, round, and reactive to light.  Neck:     Musculoskeletal: Normal range of motion.     Vascular: No JVD.     Trachea: No tracheal deviation.  Pulmonary:     Effort: Pulmonary effort is normal.     Breath sounds: No stridor.  Musculoskeletal:     Comments: No C or T-spine tenderness palpation, tenderness palpation of the lower midline lumbar region, right upper gluteus, and right posterior thigh-patient has scarring noted to his midline lumbar back with deep cavity with no overlying redness or warmth to touch, no fluctuance noted-tenderness along posterior thigh at previous surgical incision site, no overlying redness-still sensation strength motor function intact  Neurological:     Mental Status: He is alert and oriented to person, place, and time.     Coordination: Coordination normal.  Psychiatric:        Behavior: Behavior normal.        Thought Content: Thought content normal.        Judgment: Judgment normal.      ED Treatments / Results  Labs (all labs ordered are listed, but only abnormal results are displayed) Labs Reviewed   CBC WITH DIFFERENTIAL/PLATELET - Abnormal; Notable for the following components:      Result Value   Platelets 52 (*)    All other components within normal limits  BASIC METABOLIC PANEL - Abnormal; Notable for the following components:   Glucose, Bld 115 (*)    All other components within normal limits  C-REACTIVE PROTEIN  SEDIMENTATION RATE    EKG None  Radiology Mr Lumbar Spine W Wo Contrast  Result Date: 09/29/2018 CLINICAL DATA:  Low back pain radiating down the right leg. History of septic lumbar facet arthritis with epidural abscess in 10/2016. EXAM: MRI LUMBAR SPINE WITHOUT AND WITH CONTRAST TECHNIQUE: Multiplanar and multiecho pulse sequences of the lumbar spine were obtained without and with intravenous contrast. CONTRAST:  10 mL Gadavist COMPARISON:  CT abdomen and pelvis 11/10/2017 FINDINGS: Segmentation:  Standard. Alignment: Minimal lumbar dextroscoliosis. No significant listhesis. Vertebrae: No fracture, suspicious osseous lesion, or significant marrow edema. No evidence of acute discitis. Multiple small Schmorl's nodes in the upper lumbar and lower thoracic spine. Diffuse spinal canal narrowing due to congenitally short pedicles. Mildly diminished bone marrow T1 signal intensity diffusely, nonspecific though can be seen with anemia, smoking, obesity, and other chronic disease processes. Conus medullaris and cauda equina: Conus extends to the T12-L1 level. Conus and cauda equina appear normal. Paraspinal and other soft tissues: Postsurgical defect or other wound in the subcutaneous midline soft tissues of the lower back. No paraspinal abscess. Disc levels: Disc desiccation throughout the lumbar spine with exception of L5-S1. Moderate disc space narrowing at L1-2 and L2-3 with mild-to-moderate narrowing at L3-4, mild narrowing at L4-5, and mild to moderate narrowing in the lower thoracic spine. T10-11: Only imaged sagittally. Broad posterior disc protrusion/disc osteophyte complex  greater to the right of midline than the left resulting in mild spinal stenosis. T11-12: Only imaged sagittally.  Schmorl's node.  No stenosis. T12-L1: Only imaged sagittally. Right paracentral to medial right foraminal disc protrusion results in mild spinal stenosis and mild right neural foraminal stenosis. L1-2: Schmorl's nodes.  Minimal disc  bulging without stenosis. L2-3: Circumferential disc bulging, moderate-sized central disc extrusion with inferior migration, congenitally short pedicles, prominent dorsal epidural fat, and mild facet hypertrophy result in severe spinal stenosis and mild right neural foraminal stenosis. L3-4: Circumferential disc bulging, small superimposed central disc protrusion, congenitally short pedicles, prominent dorsal epidural fat, and mild facet hypertrophy result in severe spinal stenosis and mild-to-moderate right and moderate left neural foraminal stenosis. L4-5: Prior right laminectomy and facetectomy. Mild disc bulging, a moderate-sized central disc protrusion, congenitally short pedicles, and left facet hypertrophy result in severe spinal stenosis, asymmetric left lateral recess stenosis, and mild-to-moderate left neural foraminal stenosis. L5-S1: Normal disc.  Mild facet arthrosis without stenosis. IMPRESSION: 1. No evidence of acute infection in the lumbar spine. 2. 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 3. Severe spinal stenosis at L2-3 with a central disc extrusion contributing. 4. Severe spinal stenosis at L3-4 and L4-5. Electronically Signed   By: Logan Bores M.D.   On: 09/29/2018 16:00   Mr Femur Right W Wo Contrast  Result Date: 09/29/2018 CLINICAL DATA:  Low back pain radiating down the right leg. EXAM: MRI OF THE RIGHT FEMUR WITHOUT AND WITH CONTRAST TECHNIQUE: Multiplanar, multisequence MR imaging of the right femur was performed both before and after administration of intravenous contrast.  CONTRAST:  10 mL Gadavist COMPARISON:  None. FINDINGS: Bones/Joint/Cartilage No marrow signal abnormality. No fracture or dislocation. Normal alignment. No joint effusion. No periosteal reaction or bone destruction. No aggressive osseous lesion. Ligaments,  Muscles and Tendons Muscles are normal. No muscle atrophy. No intramuscular fluid collection or hematoma. Mild tendinosis of the right gluteus minimus tendon insertion. Soft tissue No fluid collection or hematoma. No soft tissue mass. Neurovascular bundles are intact. IMPRESSION: 1. No acute abnormality of right femur. 2. No myositis of the right femur. Electronically Signed   By: Kathreen Devoid   On: 09/29/2018 15:48    Procedures Procedures (including critical care time)  Medications Ordered in ED Medications  fentaNYL (SUBLIMAZE) injection 50 mcg (50 mcg Intravenous Given 09/29/18 1137)  gadobutrol (GADAVIST) 1 MMOL/ML injection 10 mL (10 mLs Intravenous Contrast Given 09/29/18 1517)     Initial Impression / Assessment and Plan / ED Course  I have reviewed the triage vital signs and the nursing notes.  Pertinent labs & imaging results that were available during my care of the patient were reviewed by me and considered in my medical decision making (see chart for details).     Labs: CRP, sed rate, CBC, BMP  Imaging: MRI lumbar, MRI femur  Consults:  Therapeutics: Fentanyl  Discharge Meds:   Assessment/Plan: 38 year old male presents today with complaints of back and leg pain.  He notes this feels similar to previous presentation.  Due to significant previous infection and characteristic symptoms in-depth evaluation started.  Patient has a normal CRP, normal sed rate, normal white count and is afebrile.  Patient is pending MRI at this time.  Patient care signed to oncoming provider pending MRI results this position.  Anticipate if no significant findings are noted patient will be discharged with outpatient follow-up.   Patient's MRI  returned showing no signs of acute infection.  It was significant for spinal stenosis with disc extrusion.  Patient will follow-up closely with Dr. Rolena Infante, return immediately if he develops any new or worsening signs or symptoms.  He verbalized understanding and agreement to today's plan had no further questions or concerns.   Final Clinical Impressions(s) / ED  Diagnoses   Final diagnoses:  Acute midline low back pain without sciatica    ED Discharge Orders         Ordered    methocarbamol (ROBAXIN) 500 MG tablet  2 times daily,   Status:  Discontinued     09/29/18 1614    methocarbamol (ROBAXIN) 500 MG tablet  2 times daily     09/29/18 1614           Okey Regal, PA-C 09/29/18 1546    Niobe Dick, Dellis Filbert, PA-C 09/29/18 1616    Nat Christen, MD 10/01/18 1704

## 2018-09-29 NOTE — ED Notes (Signed)
Patient transported to MRI 

## 2018-09-29 NOTE — ED Notes (Signed)
Pt transported to MRI at this time 

## 2018-09-29 NOTE — Discharge Instructions (Signed)
Please read attached information.  Please contact Dr. Rolena Infante and schedule follow-up evaluation.  Return immediately if develop any new or worsening signs or symptoms.

## 2019-01-09 ENCOUNTER — Other Ambulatory Visit: Payer: Self-pay

## 2019-01-09 ENCOUNTER — Encounter (HOSPITAL_COMMUNITY): Payer: Self-pay | Admitting: Emergency Medicine

## 2019-01-09 ENCOUNTER — Inpatient Hospital Stay (HOSPITAL_COMMUNITY)
Admission: EM | Admit: 2019-01-09 | Discharge: 2019-01-17 | DRG: 432 | Disposition: A | Discharge status: Home Health | Payer: No Typology Code available for payment source | Attending: Family Medicine | Admitting: Family Medicine

## 2019-01-09 DIAGNOSIS — N179 Acute kidney failure, unspecified: Secondary | ICD-10-CM | POA: Diagnosis present

## 2019-01-09 DIAGNOSIS — K7030 Alcoholic cirrhosis of liver without ascites: Secondary | ICD-10-CM | POA: Diagnosis present

## 2019-01-09 DIAGNOSIS — Z79899 Other long term (current) drug therapy: Secondary | ICD-10-CM

## 2019-01-09 DIAGNOSIS — D696 Thrombocytopenia, unspecified: Secondary | ICD-10-CM

## 2019-01-09 DIAGNOSIS — Z833 Family history of diabetes mellitus: Secondary | ICD-10-CM

## 2019-01-09 DIAGNOSIS — K7010 Alcoholic hepatitis without ascites: Secondary | ICD-10-CM | POA: Diagnosis present

## 2019-01-09 DIAGNOSIS — F10239 Alcohol dependence with withdrawal, unspecified: Secondary | ICD-10-CM

## 2019-01-09 DIAGNOSIS — E876 Hypokalemia: Secondary | ICD-10-CM | POA: Diagnosis present

## 2019-01-09 DIAGNOSIS — E871 Hypo-osmolality and hyponatremia: Secondary | ICD-10-CM | POA: Diagnosis present

## 2019-01-09 DIAGNOSIS — I959 Hypotension, unspecified: Secondary | ICD-10-CM | POA: Diagnosis present

## 2019-01-09 DIAGNOSIS — K7040 Alcoholic hepatic failure without coma: Secondary | ICD-10-CM | POA: Diagnosis present

## 2019-01-09 DIAGNOSIS — Z87891 Personal history of nicotine dependence: Secondary | ICD-10-CM

## 2019-01-09 DIAGNOSIS — Z6834 Body mass index (BMI) 34.0-34.9, adult: Secondary | ICD-10-CM

## 2019-01-09 DIAGNOSIS — K767 Hepatorenal syndrome: Secondary | ICD-10-CM | POA: Diagnosis present

## 2019-01-09 DIAGNOSIS — K5900 Constipation, unspecified: Secondary | ICD-10-CM | POA: Diagnosis present

## 2019-01-09 DIAGNOSIS — D6959 Other secondary thrombocytopenia: Secondary | ICD-10-CM | POA: Diagnosis present

## 2019-01-09 DIAGNOSIS — F10939 Alcohol use, unspecified with withdrawal, unspecified: Secondary | ICD-10-CM | POA: Diagnosis present

## 2019-01-09 DIAGNOSIS — K709 Alcoholic liver disease, unspecified: Secondary | ICD-10-CM | POA: Insufficient documentation

## 2019-01-09 DIAGNOSIS — K859 Acute pancreatitis without necrosis or infection, unspecified: Secondary | ICD-10-CM | POA: Diagnosis present

## 2019-01-09 DIAGNOSIS — F10231 Alcohol dependence with withdrawal delirium: Secondary | ICD-10-CM | POA: Diagnosis present

## 2019-01-09 DIAGNOSIS — E669 Obesity, unspecified: Secondary | ICD-10-CM | POA: Diagnosis present

## 2019-01-09 DIAGNOSIS — B182 Chronic viral hepatitis C: Secondary | ICD-10-CM | POA: Diagnosis present

## 2019-01-09 DIAGNOSIS — G4733 Obstructive sleep apnea (adult) (pediatric): Secondary | ICD-10-CM | POA: Diagnosis present

## 2019-01-09 HISTORY — DX: Alcoholic hepatitis without ascites: K70.10

## 2019-01-09 HISTORY — DX: Alcohol induced acute pancreatitis without necrosis or infection: K85.20

## 2019-01-09 LAB — COMPREHENSIVE METABOLIC PANEL
ALT: 80 U/L — ABNORMAL HIGH (ref 0–44)
AST: 250 U/L — ABNORMAL HIGH (ref 15–41)
Albumin: 2.4 g/dL — ABNORMAL LOW (ref 3.5–5.0)
Alkaline Phosphatase: 121 U/L (ref 38–126)
Anion gap: 15 (ref 5–15)
BUN: 7 mg/dL (ref 6–20)
CO2: 23 mmol/L (ref 22–32)
Calcium: 8.2 mg/dL — ABNORMAL LOW (ref 8.9–10.3)
Chloride: 92 mmol/L — ABNORMAL LOW (ref 98–111)
Creatinine, Ser: 0.57 mg/dL — ABNORMAL LOW (ref 0.61–1.24)
GFR calc Af Amer: >60 mL/min (ref >60)
GFR calc non Af Amer: >60 mL/min (ref >60)
Glucose, Bld: 143 mg/dL — ABNORMAL HIGH (ref 70–99)
Potassium: 3 mmol/L — ABNORMAL LOW (ref 3.5–5.1)
Sodium: 130 mmol/L — ABNORMAL LOW (ref 135–145)
Total Bilirubin: 36.1 mg/dL (ref 0.3–1.2)
Total Protein: 7.4 g/dL (ref 6.5–8.1)

## 2019-01-09 LAB — CBC WITH DIFFERENTIAL/PLATELET
Abs Immature Granulocytes: 0.09 10*3/uL — ABNORMAL HIGH (ref 0.00–0.07)
Basophils Absolute: 0.1 10*3/uL (ref 0.0–0.1)
Basophils Relative: 2 %
Eosinophils Absolute: 0.1 10*3/uL (ref 0.0–0.5)
Eosinophils Relative: 1 %
HCT: 38.3 % — ABNORMAL LOW (ref 39.0–52.0)
Hemoglobin: 13.6 g/dL (ref 13.0–17.0)
Immature Granulocytes: 1 %
Lymphocytes Relative: 16 %
Lymphs Abs: 1.3 10*3/uL (ref 0.7–4.0)
MCH: 33.3 pg (ref 26.0–34.0)
MCHC: 35.5 g/dL (ref 30.0–36.0)
MCV: 93.9 fL (ref 80.0–100.0)
Monocytes Absolute: 1.7 10*3/uL — ABNORMAL HIGH (ref 0.1–1.0)
Monocytes Relative: 20 %
Neutro Abs: 5 10*3/uL (ref 1.7–7.7)
Neutrophils Relative %: 60 %
Platelets: 52 10*3/uL — ABNORMAL LOW (ref 150–400)
RBC: 4.08 MIL/uL — ABNORMAL LOW (ref 4.22–5.81)
RDW: 18.4 % — ABNORMAL HIGH (ref 11.5–15.5)
WBC: 8.2 10*3/uL (ref 4.0–10.5)
nRBC: 0.2 % (ref 0.0–0.2)

## 2019-01-09 LAB — PROTIME-INR
INR: 2.4 — ABNORMAL HIGH (ref 0.8–1.2)
Prothrombin Time: 25.9 seconds — ABNORMAL HIGH (ref 11.4–15.2)

## 2019-01-09 LAB — LIPASE, BLOOD: Lipase: 786 U/L — ABNORMAL HIGH (ref 11–51)

## 2019-01-09 MED ORDER — SODIUM CHLORIDE 0.9 % IV BOLUS
1000.0000 mL | Freq: Once | INTRAVENOUS | Status: AC
  Administered 2019-01-09: 1000 mL via INTRAVENOUS

## 2019-01-09 MED ORDER — VITAMIN B-1 100 MG PO TABS
100.0000 mg | ORAL_TABLET | Freq: Every day | ORAL | Status: DC
  Administered 2019-01-10 – 2019-01-17 (×7): 100 mg via ORAL
  Filled 2019-01-09 (×7): qty 1

## 2019-01-09 MED ORDER — LORAZEPAM 1 MG PO TABS: 0.0000 mg | ORAL_TABLET | Freq: Two times a day (BID) | ORAL | Status: DC

## 2019-01-09 MED ORDER — THIAMINE HCL 100 MG/ML IJ SOLN
100.0000 mg | Freq: Every day | INTRAMUSCULAR | Status: DC
  Administered 2019-01-12: 10:00:00 100 mg via INTRAVENOUS
  Filled 2019-01-09 (×2): qty 2

## 2019-01-09 MED ORDER — LORAZEPAM 2 MG/ML IJ SOLN
0.0000 mg | Freq: Four times a day (QID) | INTRAMUSCULAR | Status: DC
  Administered 2019-01-10 – 2019-01-11 (×2): 2 mg via INTRAVENOUS
  Administered 2019-01-11: 22:00:00 4 mg via INTRAVENOUS
  Filled 2019-01-09: qty 2
  Filled 2019-01-09 (×2): qty 1

## 2019-01-09 MED ORDER — ONDANSETRON HCL 4 MG/2ML IJ SOLN
4.0000 mg | Freq: Once | INTRAMUSCULAR | Status: AC
  Administered 2019-01-09: 4 mg via INTRAVENOUS
  Filled 2019-01-09: qty 2

## 2019-01-09 MED ORDER — LORAZEPAM 2 MG/ML IJ SOLN: 0.0000 mg | Freq: Two times a day (BID) | INTRAMUSCULAR | Status: DC

## 2019-01-09 MED ORDER — CHLORDIAZEPOXIDE HCL 25 MG PO CAPS
100.0000 mg | ORAL_CAPSULE | Freq: Once | ORAL | Status: AC
  Administered 2019-01-09: 100 mg via ORAL
  Filled 2019-01-09: qty 4

## 2019-01-09 MED ORDER — LORAZEPAM 1 MG PO TABS
0.0000 mg | ORAL_TABLET | Freq: Four times a day (QID) | ORAL | Status: DC
  Administered 2019-01-10 – 2019-01-11 (×3): 1 mg via ORAL
  Administered 2019-01-11: 12:00:00 2 mg via ORAL
  Filled 2019-01-09: qty 2
  Filled 2019-01-09 (×4): qty 1

## 2019-01-09 NOTE — ED Triage Notes (Signed)
Pt coming in with complaints of nausea, vomiting, and diarrhea for [ast several days. Upon assessment pt is very jaundice. Alert and oriented

## 2019-01-09 NOTE — ED Provider Notes (Signed)
[facility] EMERGENCY DEPARTMENT Provider Note   CSN: [id number] Arrival date & time: 01/09/19  2128    History   Chief Complaint Chief Complaint  Patient presents with  . Emesis  . Diarrhea    HPI [name] is a 38 y.o. male.     38 yo M with a significant past medical history of alcoholic cirrhosis comes in with a chief complaint of a yellowing to his skin.  Patient has had nausea vomiting and diarrhea over the past few days.  He was initially constipated and so did not want to eat anything and had been having a little "squirts "frequently.  He denies any abdominal pain denies fevers or chills.  Has been continuing to drink alcohol.  Is tried to cut down.  He is really here because he thinks his skin color is drastically changed more yellow.  The history is provided by the patient.  Illness  Severity:  Mild Onset quality:  Gradual Duration:  2 days Timing:  Constant Progression:  Worsening Chronicity:  New Associated symptoms: diarrhea, nausea and vomiting   Associated symptoms: no abdominal pain, no chest pain, no congestion, no fever, no headaches, no myalgias, no rash and no shortness of breath     Past Medical History:  Diagnosis Date  . Alcoholic cirrhosis ([place])   . Cellulitis   . Cellulitis 10/2016   right leg  . Elevated liver function tests 10/2016  . 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 ETOH abuse   . Hepatitis C    Hepatitis C  . Paraspinal abscess ([place])   . Thrombocytopenia ([place]) 25/2018    Patient Active Problem List   Diagnosis Date Noted  . Medication monitoring encounter 01/19/2017  . Weakness of right lower extremity 11/19/2016  . 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 Abnormality of gait   . Post-operative pain   . Infective myositis of right thigh   . Coagulopathy ([place])   . DTs (delirium tremens) ([place])   . Transaminitis   . Infection 11/12/2016  . Alcoholic cirrhosis of liver without ascites ([place])    . Goals of care, counseling/discussion   . Palliative care by specialist   . 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 Traumatic compartment syndrome of other sites   . 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 Traumatic compartment syndrome of lower extremity ([place])   . 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 Delirium tremens ([place])   . Abnormal transaminases   . Alcoholic hepatitis without ascites   . ETOH abuse   . Abscess of right lower extremity   . Cellulitis and abscess of right lower extremity   . Hypernatremia   . Cellulitis of right lower extremity 10/30/2016  . EtOH dependence ([place]) 10/30/2016  . Thrombocytopenia ([place]) 10/30/2016  . Cirrhosis ([place])   . Paraspinal abscess ([place])   . Chronic hepatitis C without hepatic coma ([place])   . Hepatitis   . Epidural abscess 10/29/2016  . Liver failure ([place]) 10/29/2016  . Sepsis ([place]) 10/29/2016  . Hyponatremia 10/29/2016    Past Surgical History:  Procedure Laterality Date  . DECOMPRESSIVE LUMBAR LAMINECTOMY LEVEL 1 N/A 11/01/2016   Procedure: DECOMPRESSIVE POSTERIOR LUMBAR LAMINECTOMY LEVEL 4-5;  Surgeon: Melina Schools, MD;  Location: [place];  Service: Orthopedics;  Laterality: N/A;  . HERNIA REPAIR Left    INGUNIAL  . I&D EXTREMITY Right 11/01/2016   Procedure: IRRIGATION AND DEBRIDEMENT ABSCESS RIGHT THIGH;  Surgeon: Melina Schools, MD;  Location: [place];  Service: Orthopedics;  Laterality: Right;  . INCISION AND DRAINAGE OF WOUND N/A 01/06/2017   Procedure: IRRIGATION AND DEBRIDEMENT and placement of wound vac;  Surgeon: Melina Schools, MD;  Location: [place];  Service: Orthopedics;  Laterality: N/A;  . INCISION AND DRAINAGE OF WOUND N/A 12/02/2016   Procedure: WASHOUT AND CLOSURE OF WOUND;  Surgeon: Melina Schools, MD;  Location: [place];  Service: Orthopedics;  Laterality: N/A;  . LUMBAR WOUND DEBRIDEMENT N/A 11/12/2016   Procedure: LUMBAR WOUND DEBRIDEMENT;  Surgeon: Melina Schools, MD;  Location: [place];  Service: Orthopedics;   Laterality: N/A;        Home Medications    Prior to Admission medications   Medication Sig Start Date End Date Taking? Authorizing Provider  chlordiazePOXIDE (LIBRIUM) 25 MG capsule 50mg  PO TID x 1D, then 25-50mg  PO BID X 1D, then 25-50mg  PO QD X 1D 07/28/18   Caccavale, Sophia, PA-C  LORazepam (ATIVAN) 0.5 MG tablet Take 5 mg by mouth at bedtime as needed.    [provider]  methocarbamol (ROBAXIN) 500 MG tablet Take 1 tablet (500 mg total) by mouth 2 (two) times daily. 09/29/18   Hedges, Dellis Filbert, PA-C  Multiple Vitamin (MULTIVITAMIN WITH MINERALS) TABS tablet Take 1 tablet by mouth daily. Patient not taking: Reported on 11/10/2017 11/17/16   Elgergawy, Silver Huguenin, MD  ondansetron (ZOFRAN ODT) 4 MG disintegrating tablet Take 1 tablet (4 mg total) by mouth every 8 (eight) hours as needed for nausea or vomiting. 11/10/17   Fredia Sorrow, MD  oxyCODONE (OXY IR/ROXICODONE) 5 MG immediate release tablet Take 1 tablet (5 mg total) by mouth every 4 (four) hours as needed for severe pain. Patient not taking: Reported on 11/10/2017 01/06/17   Melina Schools, MD  potassium chloride SA (K-DUR,KLOR-CON) 20 MEQ tablet Take 1 tablet (20 mEq total) by mouth daily for 7 days. 07/28/18 08/04/18  Caccavale, Sophia, PA-C  promethazine (PHENERGAN) 25 MG tablet Take 1 tablet (25 mg total) by mouth every 6 (six) hours as needed for nausea or vomiting. Patient not taking: Reported on 07/28/2018 11/10/17   Fredia Sorrow, MD  protein supplement shake (PREMIER PROTEIN) LIQD Take 325 mLs (11 oz total) by mouth 2 (two) times daily between meals. Patient not taking: Reported on 11/10/2017 11/17/16   Elgergawy, Silver Huguenin, MD    Family History Family History  Problem Relation Age of Onset  . Diabetes Mother     Social History Social History   Tobacco Use  . Smoking status: Former Smoker    Last attempt to quit: 01/04/2017    Years since quitting: 2.0  . Smokeless tobacco: Never Used  . Tobacco comment: was  a social smoker- maybe 5 a week  Substance Use Topics  . Alcohol use: Yes    Comment: occasionally  . Drug use: No    Comment: as a teen a few times     Allergies   Acetaminophen   Review of Systems Review of Systems  Constitutional: Negative for chills and fever.  HENT: Negative for congestion and facial swelling.   Eyes: Negative for discharge and visual disturbance.  Respiratory: Negative for shortness of breath.   Cardiovascular: Negative for chest pain and palpitations.  Gastrointestinal: Positive for diarrhea, nausea and vomiting. Negative for abdominal pain.  Musculoskeletal: Negative for arthralgias and myalgias.  Skin: Negative for color change and rash.  Neurological: Negative for tremors, syncope and headaches.  Psychiatric/Behavioral: Negative for confusion and dysphoric mood.  Physical Exam Updated Vital Signs BP 134/76   Pulse 94   Temp 98.7 F (37.1 C) (Oral)   Resp 19   Ht 5\' 11"  (1.803 m)   Wt 113.4 kg   SpO2 93%   BMI 34.87 kg/m   Physical Exam Vitals signs and nursing note reviewed.  Constitutional:      Appearance: He is well-developed.  HENT:     Head: Normocephalic and atraumatic.  Eyes:     Pupils: Pupils are equal, round, and reactive to light.  Neck:     Musculoskeletal: Normal range of motion and neck supple.     Vascular: No JVD.  Cardiovascular:     Rate and Rhythm: Regular rhythm. Tachycardia present.     Heart sounds: No murmur. No friction rub. No gallop.   Pulmonary:     Effort: No respiratory distress.     Breath sounds: No wheezing.  Abdominal:     General: There is no distension.     Tenderness: There is no guarding or rebound.  Musculoskeletal: Normal range of motion.  Skin:    Coloration: Skin is jaundiced. Skin is not pale.     Findings: No rash.  Neurological:     Mental Status: He is alert and oriented to person, place, and time.  Psychiatric:        Behavior: Behavior normal.      ED Treatments /  Results  Labs (all labs ordered are listed, but only abnormal results are displayed) Labs Reviewed  CBC WITH DIFFERENTIAL/PLATELET - Abnormal; Notable for the following components:      Result Value   RBC 4.08 (*)    HCT 38.3 (*)    RDW 18.4 (*)    Platelets 52 (*)    Monocytes Absolute 1.7 (*)    Abs Immature Granulocytes 0.09 (*)    All other components within normal limits  COMPREHENSIVE METABOLIC PANEL - Abnormal; Notable for the following components:   Sodium 130 (*)    Potassium 3.0 (*)    Chloride 92 (*)    Glucose, Bld 143 (*)    Creatinine, Ser 0.57 (*)    Calcium 8.2 (*)    Albumin 2.4 (*)    AST 250 (*)    ALT 80 (*)    Total Bilirubin 36.1 (*)    All other components within normal limits  LIPASE, BLOOD - Abnormal; Notable for the following components:   Lipase 786 (*)    All other components within normal limits  PROTIME-INR - Abnormal; Notable for the following components:   Prothrombin Time 25.9 (*)    INR 2.4 (*)    All other components within normal limits    EKG None  Radiology No results found.  Procedures Procedures (including critical care time)  Medications Ordered in ED Medications  LORazepam (ATIVAN) injection 0-4 mg (has no administration in time range)    Or  LORazepam (ATIVAN) tablet 0-4 mg (has no administration in time range)  LORazepam (ATIVAN) injection 0-4 mg (has no administration in time range)    Or  LORazepam (ATIVAN) tablet 0-4 mg (has no administration in time range)  thiamine (VITAMIN B-1) tablet 100 mg (has no administration in time range)    Or  thiamine (B-1) injection 100 mg (has no administration in time range)  sodium chloride 0.9 % bolus 1,000 mL (1,000 mLs Intravenous New Bag/Given 01/09/19 2150)  ondansetron (ZOFRAN) injection 4 mg (4 mg Intravenous Given 01/09/19 2201)  chlordiazePOXIDE (LIBRIUM) capsule 100  mg (100 mg Oral Given 01/09/19 2229)     Initial Impression / Assessment and Plan / ED Course  I have  reviewed the triage vital signs and the nursing notes.  Pertinent labs & imaging results that were available during my care of the patient were reviewed by me and considered in my medical decision making (see chart for details).        38 yo M with a chief complaint of nausea vomiting and diarrhea.  Going on for the past few days and has resolved but the patient is worried because his skin is much more yellow than it typically is.  Will obtain lab work give a bolus of fluids as the patient is tachycardic give nausea medicine and reassess.  Tachycardia improved.  Patient nausea significantly improved.  Labs concerning for worsening hyperbilirubinemia and now an elevated INR.  Discussed with Dr. Pincus Sanes, recommended morning CMP and INR.  Recommended right upper quadrant ultrasound to evaluate for obstructive process though we felt this is unlikely.  Thought more likely to be a acute on chronic alcoholic hepatitis. Recommended hospitalist admission.    CRITICAL CARE Performed by: Cecilio Asper   Total critical care time: 35 minutes  Critical care time was exclusive of separately billable procedures and treating other patients.  Critical care was necessary to treat or prevent imminent or life-threatening deterioration.  Critical care was time spent personally by me on the following activities: development of treatment plan with patient and/or surrogate as well as nursing, discussions with consultants, evaluation of patient's response to treatment, examination of patient, obtaining history from patient or surrogate, ordering and performing treatments and interventions, ordering and review of laboratory studies, ordering and review of radiographic studies, pulse oximetry and re-evaluation of patient's condition.  The patients results and plan were reviewed and discussed.   Any x-rays performed were independently reviewed by myself.   Differential diagnosis were considered with the  presenting HPI.  Medications  LORazepam (ATIVAN) injection 0-4 mg (has no administration in time range)    Or  LORazepam (ATIVAN) tablet 0-4 mg (has no administration in time range)  LORazepam (ATIVAN) injection 0-4 mg (has no administration in time range)    Or  LORazepam (ATIVAN) tablet 0-4 mg (has no administration in time range)  thiamine (VITAMIN B-1) tablet 100 mg (has no administration in time range)    Or  thiamine (B-1) injection 100 mg (has no administration in time range)  sodium chloride 0.9 % bolus 1,000 mL (1,000 mLs Intravenous New Bag/Given 01/09/19 2150)  ondansetron (ZOFRAN) injection 4 mg (4 mg Intravenous Given 01/09/19 2201)  chlordiazePOXIDE (LIBRIUM) capsule 100 mg (100 mg Oral Given 01/09/19 2229)    Vitals:   01/09/19 2136 01/09/19 2146 01/09/19 2200 01/09/19 2324  BP: (!) 148/103   134/76  Pulse: (!) 110  (!) 109 94  Resp: 19  15 19   Temp: 98.7 F (37.1 C)     TempSrc: Oral     SpO2: 95%  95% 93%  Weight:  113.4 kg    Height:  5\' 11"  (1.803 m)      Final diagnoses:  Hyperbilirubinemia    Admission/ observation were discussed with the admitting physician, patient and/or family and they are comfortable with the plan.    Final Clinical Impressions(s) / ED Diagnoses   Final diagnoses:  Hyperbilirubinemia    ED Discharge Orders    None       Deno Etienne, DO 01/09/19 2340

## 2019-01-09 NOTE — H&P (Addendum)
[facility] Admission History and Physical Service Pager: [phone]  Patient name: [name] Medical record number: [id number] Date of birth: [birth year] Age: 38 y.o. Gender: male  Primary Care Provider: Nicholes Rough, PA-C Consultants: Informally GI, formal consult in am  Code Status: Full Preferred Emergency Contact: [name], brother, [phone]  Chief Complaint: Jaundice   Assessment and Plan: [name] is a 38 y.o. male presenting with jaundice in the setting of recent vomiting/diarrhea, concerning for acute on chronic alcoholic hepatitis. PMH is significant for hepatitis C s/p Mavyret therapy, alcoholism, liver cirrhosis, alcoholic induced pancreatitis, and thrombocytopenia.   Acute alcoholic hepatitis, in setting of alcoholic cirrhosis:  Pt reports recent history of NBNB emesis, diarrhea, and progressive yellowing of skin. Drinks approximately at least 1 pint liquor daily, reports trying to cut down. Afebrile, however slightly tachycardic (low 100's) and intermittently hypertensive. Substantial jaundice throughout with scleral icterus, neurologically intact without abdominal pain on exam.  Urine at bedside dark amber.  AST 250, ALT 80 in alcoholic pattern. Total bilirubin 36.  Alk phos wnl.  No leukocytosis, WBC 8.2.  Ethyl alcohol level 100.  RUQ U/S showing gallbladder wall thickening with mild peri-cholecystic fluid.  Suspect presentation is likely secondary to acute alcohol hepatitis, in the setting of known alcohol cirrhosis and current alcohol use.  Could consider acute cholecystitis with gallbladder wall thickening on RUQ, however as he is afebrile, without leukocytosis, or abdominal pain (negative murphy's), suspect this is less likely the etiology.  ED provider has already spoken with Dr. Pincus Sanes, GI, who recommended observation overnight with repeat labs in the morning. - Admit to MedSurg, attending Dr. Nori Riis - Consult GI in am  following repeat labs, appreciate further recommendations, particularly if HIDA is needed. Pt made NPO in case of HIDA. - LR at 100 mL/hour - Zofran IV as needed - Monitor CMP, PT/ INR in am  - Vitals per routine  Alcoholic Cirrhosis  History of Hepatitis C: Chronic, worsening.  Poor current synthetic function: Albumin 2.4, INR 2.4, platelets 52. MELD-Na score 42, 65-66% 90-day mortality rate. Completed Mavyret therapy for hepatitis C in 2019.  Last EGD in 10/2017 without evidence of varices, however does have a history of gastric, perisplenic, and esophageal varices.  No previous documented history of ascites, abdomen large without fluid wave on exam.  Follows with [facility], last seen in 12/2017. Seems to have a poor insight into the extent of his condition, knows it "really bad," but appears to play down the severity and continues to drink.  - Consult GI as above - Hold anticoagulation in the setting of low platelets - Acute management as above  Hypokalemia: Acute.  K 3.0 on admit.  Likely in the setting of recent GI losses.  - Kdur 40 meq x2 - Monitor CMP in am    Mild hyponatremia: Acute.  Na 130 on admit.  Baseline around 136-138.  Asymptomatic.  Likely acute decrease with worsening liver function in the setting of acute hepatitis. - Monitor BMP - Replete potassium as above - Fluid hydration  Alcohol abuse: Chronic. Reports drinking approximately 1 pint (or more) liquor daily, however endorses he is "trying to wean down".  Last drink earlier this evening (4/13), few shots of vodka. Does have previous history of delirium tremens and alcohol withdrawal requiring Precedex infusion.  Previously was on a Librium taper in 06/2018. Already received Librium 100 mg in the ED. - CIWA protocol  - Thiamine and folate - Multivitamin  Alcohol-induced  pancreatitis: Acute.  Lipase 786, however without current abdominal pain or anorexia.  Did endorse a vague abdominal pain with associated emesis a  few days prior prior that has since resolved, discussed above.  Suspect in the setting of alcohol use and additional co-morbidities above. Considered obstructive pathology, however laboratory findings and presentation make this less likely. - Monitor CMP - Fluid hydration with LR at 100 mL/hour - Diet as tolerated - Zofran IV  Thrombocytopenia: Chronic, stable. Platelets 52, appears to be at baseline (remaining 52 since 10/2017).  Likely secondary to alcoholic cirrhosis. - Monitor CBC - SCDs  FEN/GI: Regular diet as tolerated, LR at 100 mL/hour Prophylaxis: SCDs  Disposition: Admit to [facility], attending Dr. Nori Riis  History of Present Illness:  [name] is a 38 y.o. male with history of hepatitis C s/p Mavyret therapy, alcoholism, liver cirrhosis, and alcoholic induced pancreatitis presenting with jaundice in the setting of recent emesis and diarrhea.  He reports symptoms initially started on Wednesday, 4/8, with repetitive emesis and nausea.  Vomited approximately 10-12 times over the course of 2 days, NBNB in nature, looks like the food he ate.  No further emesis since Friday, 4/10.  He did not eat much during that time given emesis, however was able to eat some Ramen noodles today and has been drinking water/Powerade.  Also endorses some constipation since Saturday, 4/11, having "squirts" without a true bowel movement.  Last normal BM on Saturday he believes.  Reports BMs have been varying in color, sometimes brown/green, did note one episode of "red "on the outside of the stool last night. Had some vague abdominal pains during this time, however no current abdominal pain. He presented for further evaluation today after concerns that his skin had become more yellow in color.  He also reports his urine became dark today.  He denies any associated fever, lightheadedness/dizziness, confusion, CP/SOB.  Continues to drink alcohol, endorses drinking approximately a pint of vodka daily, "trying  to wean down ".  Reports last drink was this afternoon/night, 4/13, which was a couple of shots.  Denies any regular tobacco use (occasional cigarette reported maybe twice a week) or illicit drug use.  Follows with GI, last saw approximately 1 year ago.  ED course: On arrival, he was afebrile, however slightly tachycardic to 110s and hypertensive to 148/103.  Labs notable for albumin 2.4, lipase 786, AST 250, ALT 80, total bilirubin 36, platelets 52, INR 2.4, and alkaline phosphatase 121.  Ammonia 56.  Serum alcohol level 100.  ED provider spoke with Dr. Loletha Carrow, GI, who recommended RUQ ultrasound and observation overnight with repeat labs in am.  RUQ ultrasound showing increased gallbladder wall thickening with mild peri-cholecystic fluid, may be consistent with acute cholecystitis but recommended HIDA scan for further evaluation.   Review Of Systems: Per HPI with the following additions:   Review of Systems  Constitutional: Negative for chills, fever and malaise/fatigue.  Respiratory: Negative for cough, sputum production, shortness of breath and wheezing.   Cardiovascular: Negative for chest pain and palpitations.  Gastrointestinal: Positive for constipation, diarrhea, nausea and vomiting. Negative for abdominal pain.  Genitourinary: Negative for dysuria.  Neurological: Negative for dizziness, tremors, speech change, focal weakness, loss of consciousness and headaches.    Patient Active Problem List   Diagnosis Date Noted  . Alcoholic hepatitis 68/34/1962  . Hyperbilirubinemia   . Acute on chronic alcoholic liver disease ([place])   . Medication monitoring encounter 01/19/2017  . Weakness of right lower extremity 11/19/2016  .  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 Abnormality of gait   . Post-operative pain   . Infective myositis of right thigh   . Coagulopathy ([place])   . DTs (delirium tremens) ([place])   . Transaminitis   . Infection 11/12/2016  . Alcoholic cirrhosis of liver without  ascites ([place])   . Goals of care, counseling/discussion   . Palliative care by specialist   . Pyogenic arthritis of multiple sites ([place])   . Traumatic compartment syndrome of other sites   . Alcoholic liver failure ([place])   . Traumatic compartment syndrome of lower extremity ([place])   . 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 Delirium tremens ([place])   . Abnormal transaminases   . Alcoholic hepatitis without ascites   . ETOH abuse   . Abscess of right lower extremity   . Cellulitis and abscess of right lower extremity   . Hypernatremia   . Cellulitis of right lower extremity 10/30/2016  . EtOH dependence ([place]) 10/30/2016  . Thrombocytopenia ([place]) 10/30/2016  . Cirrhosis ([place])   . Paraspinal abscess ([place])   . Chronic hepatitis C without hepatic coma ([place])   . Hepatitis   . Epidural abscess 10/29/2016  . Liver failure ([place]) 10/29/2016  . Sepsis ([place]) 10/29/2016  . Hyponatremia 10/29/2016    Past Medical History: Past Medical History:  Diagnosis Date  . Alcoholic cirrhosis ([place])   . Cellulitis   . Cellulitis 10/2016   right leg  . Elevated liver function tests 10/2016  . 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 ETOH abuse   . Hepatitis C    Hepatitis C  . Paraspinal abscess ([place])   . Thrombocytopenia ([place]) 25/2018    Past Surgical History: Past Surgical History:  Procedure Laterality Date  . DECOMPRESSIVE LUMBAR LAMINECTOMY LEVEL 1 N/A 11/01/2016   Procedure: DECOMPRESSIVE POSTERIOR LUMBAR LAMINECTOMY LEVEL 4-5;  Surgeon: Melina Schools, MD;  Location: [place];  Service: Orthopedics;  Laterality: N/A;  . HERNIA REPAIR Left    INGUNIAL  . I&D EXTREMITY Right 11/01/2016   Procedure: IRRIGATION AND DEBRIDEMENT ABSCESS RIGHT THIGH;  Surgeon: Melina Schools, MD;  Location: [place];  Service: Orthopedics;  Laterality: Right;  . INCISION AND DRAINAGE OF WOUND N/A 01/06/2017   Procedure: IRRIGATION AND DEBRIDEMENT and placement of wound vac;  Surgeon: Melina Schools, MD;  Location: [place];  Service: Orthopedics;  Laterality: N/A;  . INCISION AND DRAINAGE OF WOUND N/A 12/02/2016   Procedure: WASHOUT AND CLOSURE OF WOUND;  Surgeon: Melina Schools, MD;  Location: [place] [facility];  Service: Orthopedics;  Laterality: N/A;  . LUMBAR WOUND DEBRIDEMENT N/A 11/12/2016   Procedure: LUMBAR WOUND DEBRIDEMENT;  Surgeon: Melina Schools, MD;  Location: [facility];  Service: Orthopedics;  Laterality: N/A;    Social History: Social History   Tobacco Use  . Smoking status: Former Smoker    Last attempt to quit: 01/04/2017    Years since quitting: 2.0  . Smokeless tobacco: Never Used  . Tobacco comment: was a social smoker- maybe 5 a week  Substance Use Topics  . Alcohol use: Yes    Comment: occasionally  . Drug use: No    Comment: as a teen a few times   Additional social history: Lives at home with his girlfriend, 2 dogs. Please also refer to relevant sections of EMR.  Family History: Family History  Problem Relation Age of Onset  . Diabetes Mother     Allergies and Medications: Allergies  Allergen Reactions  . Acetaminophen Other (See  Comments)    Due to liver issues   No current facility-administered medications on file prior to encounter.    Current Outpatient Medications on File Prior to Encounter  Medication Sig Dispense Refill  . chlordiazePOXIDE (LIBRIUM) 25 MG capsule 37m PO TID x 1D, then 25-534mPO BID X 1D, then 25-5074mO QD X 1D 10 capsule 0  . LORazepam (ATIVAN) 0.5 MG tablet Take 5 mg by mouth at bedtime as needed.    . methocarbamol (ROBAXIN) 500 MG tablet Take 1 tablet (500 mg total) by mouth 2 (two) times daily. 20 tablet 0  . Multiple Vitamin (MULTIVITAMIN WITH MINERALS) TABS tablet Take 1 tablet by mouth daily. (Patient not taking: Reported on 11/10/2017)    . ondansetron (ZOFRAN ODT) 4 MG disintegrating tablet Take 1 tablet (4 mg total) by mouth every 8 (eight) hours as needed for nausea or vomiting. 12 tablet 1  . oxyCODONE (OXY IR/ROXICODONE) 5 MG immediate release  tablet Take 1 tablet (5 mg total) by mouth every 4 (four) hours as needed for severe pain. (Patient not taking: Reported on 11/10/2017) 30 tablet 0  . potassium chloride SA (K-DUR,KLOR-CON) 20 MEQ tablet Take 1 tablet (20 mEq total) by mouth daily for 7 days. 7 tablet 0  . promethazine (PHENERGAN) 25 MG tablet Take 1 tablet (25 mg total) by mouth every 6 (six) hours as needed for nausea or vomiting. (Patient not taking: Reported on 07/28/2018) 30 tablet 0  . protein supplement shake (PREMIER PROTEIN) LIQD Take 325 mLs (11 oz total) by mouth 2 (two) times daily between meals. (Patient not taking: Reported on 11/10/2017)  0    Objective: BP (!) 150/80 (BP Location: Left Arm)   Pulse 100   Temp 98.4 F (36.9 C) (Oral)   Resp 17   Ht '5\' 11"'  (1.803 m)   Wt 113.4 kg   SpO2 98%   BMI 34.87 kg/m  Exam: General: Alert, pleasant gentleman in no acute distress HEENT: NCAT, MM slightly dry, scleral icterus bilaterally Cardiac: RRR no m/g/r Lungs: Clear bilaterally, no increased WOB  Abdomen: soft large abdomen, nontender throughout, mildly distended, normoactive bowel sounds.  Negative Murphy sign.  Unable to evaluate hepatosplenomegaly. Msk: Moves all extremities spontaneously  Ext: Warm, dry, 2+ distal pulses, no edema Neuro: Alert and oriented.  Speech appropriate.  No focal neuro deficits noted.  No Asterix noted.  Derm: Jaundiced skin throughout, most appreciated on face and chest. Psych: Appropriate affect  Labs and Imaging: CBC BMET  Recent Labs  Lab 01/09/19 2150  WBC 8.2  HGB 13.6  HCT 38.3*  PLT 52*   Recent Labs  Lab 01/09/19 2150  NA 130*  K 3.0*  CL 92*  CO2 23  BUN 7  CREATININE 0.57*  GLUCOSE 143*  CALCIUM 8.2*     Us [place] Limited Ruq  Result Date: 01/10/2019 CLINICAL DATA:  Hyperbilirubinemia EXAM: ULTRASOUND ABDOMEN LIMITED RIGHT UPPER QUADRANT COMPARISON:  None. FINDINGS: Gallbladder: Gallbladder is partially distended with thickened irregular wall  measuring up to 11 mm. Minimal pericholecystic fluid is noted. No definitive cholelithiasis is seen. Gallbladder sludge is noted. Common bile duct: Diameter: 4.7 mm Liver: Diffuse increased echogenicity is noted consistent with fatty infiltration. No focal mass is noted. Poor flow is reversed and hepatofugal. IMPRESSION: Increased gallbladder wall thickening with mild pericholecystic fluid. This may represent acute cholecystitis in the appropriate clinical setting. HIDA scan may be helpful to assess for gallbladder function. Fatty liver. Hepatofugal portal flow. Electronically Signed  By: Inez Catalina M.D.   On: 01/10/2019 00:53    Patriciaann Clan, DO 01/10/2019, 3:46 AM PGY-1, [facility] Intern pager: [phone], text pages welcome  FPTS Upper-Level Resident Addendum   I have independently interviewed and examined the patient. I have discussed the above with the original author and agree with their documentation. My edits for correction/addition/clarification are in blue. Please see also any attending notes.    Ralene Ok, MD PGY-3, [facility] Service pager: [phone] (text pages welcome through [facility])

## 2019-01-10 ENCOUNTER — Emergency Department (HOSPITAL_COMMUNITY): Payer: No Typology Code available for payment source

## 2019-01-10 ENCOUNTER — Encounter (HOSPITAL_COMMUNITY): Payer: Self-pay | Admitting: *Deleted

## 2019-01-10 DIAGNOSIS — E871 Hypo-osmolality and hyponatremia: Secondary | ICD-10-CM | POA: Diagnosis present

## 2019-01-10 DIAGNOSIS — K5900 Constipation, unspecified: Secondary | ICD-10-CM | POA: Diagnosis present

## 2019-01-10 DIAGNOSIS — K859 Acute pancreatitis without necrosis or infection, unspecified: Secondary | ICD-10-CM | POA: Diagnosis present

## 2019-01-10 DIAGNOSIS — Z6834 Body mass index (BMI) 34.0-34.9, adult: Secondary | ICD-10-CM | POA: Diagnosis not present

## 2019-01-10 DIAGNOSIS — K767 Hepatorenal syndrome: Secondary | ICD-10-CM | POA: Diagnosis present

## 2019-01-10 DIAGNOSIS — E876 Hypokalemia: Secondary | ICD-10-CM | POA: Diagnosis present

## 2019-01-10 DIAGNOSIS — K7030 Alcoholic cirrhosis of liver without ascites: Secondary | ICD-10-CM | POA: Diagnosis not present

## 2019-01-10 DIAGNOSIS — E669 Obesity, unspecified: Secondary | ICD-10-CM | POA: Diagnosis present

## 2019-01-10 DIAGNOSIS — G4733 Obstructive sleep apnea (adult) (pediatric): Secondary | ICD-10-CM | POA: Diagnosis present

## 2019-01-10 DIAGNOSIS — K7040 Alcoholic hepatic failure without coma: Secondary | ICD-10-CM | POA: Diagnosis present

## 2019-01-10 DIAGNOSIS — F10239 Alcohol dependence with withdrawal, unspecified: Secondary | ICD-10-CM | POA: Diagnosis not present

## 2019-01-10 DIAGNOSIS — Z833 Family history of diabetes mellitus: Secondary | ICD-10-CM | POA: Diagnosis not present

## 2019-01-10 DIAGNOSIS — K7031 Alcoholic cirrhosis of liver with ascites: Secondary | ICD-10-CM | POA: Diagnosis not present

## 2019-01-10 DIAGNOSIS — Z515 Encounter for palliative care: Secondary | ICD-10-CM | POA: Diagnosis not present

## 2019-01-10 DIAGNOSIS — K7469 Other cirrhosis of liver: Secondary | ICD-10-CM

## 2019-01-10 DIAGNOSIS — K7010 Alcoholic hepatitis without ascites: Secondary | ICD-10-CM | POA: Diagnosis present

## 2019-01-10 DIAGNOSIS — F10231 Alcohol dependence with withdrawal delirium: Secondary | ICD-10-CM | POA: Diagnosis present

## 2019-01-10 DIAGNOSIS — Z7189 Other specified counseling: Secondary | ICD-10-CM | POA: Diagnosis not present

## 2019-01-10 DIAGNOSIS — N179 Acute kidney failure, unspecified: Secondary | ICD-10-CM | POA: Diagnosis present

## 2019-01-10 DIAGNOSIS — Z87891 Personal history of nicotine dependence: Secondary | ICD-10-CM | POA: Diagnosis not present

## 2019-01-10 DIAGNOSIS — B182 Chronic viral hepatitis C: Secondary | ICD-10-CM | POA: Diagnosis present

## 2019-01-10 DIAGNOSIS — D696 Thrombocytopenia, unspecified: Secondary | ICD-10-CM | POA: Diagnosis not present

## 2019-01-10 DIAGNOSIS — F1010 Alcohol abuse, uncomplicated: Secondary | ICD-10-CM | POA: Diagnosis not present

## 2019-01-10 DIAGNOSIS — I959 Hypotension, unspecified: Secondary | ICD-10-CM | POA: Diagnosis present

## 2019-01-10 DIAGNOSIS — D6959 Other secondary thrombocytopenia: Secondary | ICD-10-CM | POA: Diagnosis present

## 2019-01-10 LAB — COMPREHENSIVE METABOLIC PANEL
ALT: 64 U/L — ABNORMAL HIGH (ref 0–44)
AST: 217 U/L — ABNORMAL HIGH (ref 15–41)
Albumin: 2.1 g/dL — ABNORMAL LOW (ref 3.5–5.0)
Alkaline Phosphatase: 107 U/L (ref 38–126)
Anion gap: 12 (ref 5–15)
BUN: 6 mg/dL (ref 6–20)
CO2: 26 mmol/L (ref 22–32)
Calcium: 8.1 mg/dL — ABNORMAL LOW (ref 8.9–10.3)
Chloride: 94 mmol/L — ABNORMAL LOW (ref 98–111)
Creatinine, Ser: 0.58 mg/dL — ABNORMAL LOW (ref 0.61–1.24)
GFR calc Af Amer: >60 mL/min (ref >60)
GFR calc non Af Amer: >60 mL/min (ref >60)
Glucose, Bld: 105 mg/dL — ABNORMAL HIGH (ref 70–99)
Potassium: 2.7 mmol/L — CL (ref 3.5–5.1)
Sodium: 132 mmol/L — ABNORMAL LOW (ref 135–145)
Total Bilirubin: 31.1 mg/dL (ref 0.3–1.2)
Total Protein: 6.4 g/dL — ABNORMAL LOW (ref 6.5–8.1)

## 2019-01-10 LAB — PROTIME-INR
INR: 2.7 — ABNORMAL HIGH (ref 0.8–1.2)
Prothrombin Time: 28 seconds — ABNORMAL HIGH (ref 11.4–15.2)

## 2019-01-10 LAB — CBC
HCT: 34.7 % — ABNORMAL LOW (ref 39.0–52.0)
Hemoglobin: 12.4 g/dL — ABNORMAL LOW (ref 13.0–17.0)
MCH: 32.9 pg (ref 26.0–34.0)
MCHC: 35.7 g/dL (ref 30.0–36.0)
MCV: 92 fL (ref 80.0–100.0)
Platelets: 32 10*3/uL — ABNORMAL LOW (ref 150–400)
RBC: 3.77 MIL/uL — ABNORMAL LOW (ref 4.22–5.81)
RDW: 18 % — ABNORMAL HIGH (ref 11.5–15.5)
WBC: 6.3 10*3/uL (ref 4.0–10.5)
nRBC: 0 % (ref 0.0–0.2)

## 2019-01-10 LAB — MAGNESIUM: Magnesium: 1.9 mg/dL (ref 1.7–2.4)

## 2019-01-10 LAB — ETHANOL: Alcohol, Ethyl (B): 100 mg/dL — ABNORMAL HIGH (ref <10)

## 2019-01-10 LAB — HIV ANTIBODY (ROUTINE TESTING W REFLEX): HIV Screen 4th Generation wRfx: NONREACTIVE

## 2019-01-10 LAB — AMMONIA: Ammonia: 56 umol/L — ABNORMAL HIGH (ref 9–35)

## 2019-01-10 MED ORDER — LACTATED RINGERS IV SOLN
INTRAVENOUS | Status: DC
  Administered 2019-01-10: 02:00:00 via INTRAVENOUS

## 2019-01-10 MED ORDER — FOLIC ACID 1 MG PO TABS
1.0000 mg | ORAL_TABLET | Freq: Every day | ORAL | Status: DC
  Administered 2019-01-10 – 2019-01-17 (×7): 1 mg via ORAL
  Filled 2019-01-10 (×7): qty 1

## 2019-01-10 MED ORDER — ONDANSETRON HCL 4 MG/2ML IJ SOLN: 4.0000 mg | Freq: Three times a day (TID) | INTRAMUSCULAR | Status: DC | PRN

## 2019-01-10 MED ORDER — PHYTONADIONE 5 MG PO TABS
10.0000 mg | ORAL_TABLET | Freq: Every day | ORAL | Status: DC
  Administered 2019-01-10 – 2019-01-11 (×2): 10 mg via ORAL
  Filled 2019-01-10 (×2): qty 2

## 2019-01-10 MED ORDER — ADULT MULTIVITAMIN W/MINERALS CH
1.0000 | ORAL_TABLET | Freq: Every day | ORAL | Status: DC
  Administered 2019-01-10 – 2019-01-17 (×7): 1 via ORAL
  Filled 2019-01-10 (×7): qty 1

## 2019-01-10 MED ORDER — POTASSIUM CHLORIDE CRYS ER 20 MEQ PO TBCR
40.0000 meq | EXTENDED_RELEASE_TABLET | Freq: Three times a day (TID) | ORAL | Status: AC
  Administered 2019-01-10 (×2): 40 meq via ORAL
  Filled 2019-01-10 (×2): qty 2

## 2019-01-10 MED ORDER — PREDNISOLONE 5 MG PO TABS
40.0000 mg | ORAL_TABLET | Freq: Every day | ORAL | Status: DC
  Administered 2019-01-10 – 2019-01-17 (×7): 40 mg via ORAL
  Filled 2019-01-10 (×8): qty 8

## 2019-01-10 MED ORDER — LACTULOSE 10 GM/15ML PO SOLN
10.0000 g | Freq: Every day | ORAL | Status: DC
  Administered 2019-01-10 – 2019-01-13 (×3): 10 g via ORAL
  Filled 2019-01-10: qty 15
  Filled 2019-01-10 (×2): qty 30

## 2019-01-10 MED ORDER — POTASSIUM CHLORIDE CRYS ER 20 MEQ PO TBCR
40.0000 meq | EXTENDED_RELEASE_TABLET | Freq: Two times a day (BID) | ORAL | Status: DC
  Administered 2019-01-10: 40 meq via ORAL
  Filled 2019-01-10: qty 2

## 2019-01-10 NOTE — ED Notes (Signed)
Patient transported to Ultrasound 

## 2019-01-10 NOTE — TOC Initial Note (Signed)
Transition of Care [facility]) - Initial/Assessment Note    Patient Details  Name: [name] MRN: [id number] Date of Birth: 27-Nov-1980  [facility] of [facility]) CM/SW Contact:    Pollie Friar, RN Phone Number: 01/10/2019, 11:00 AM  Clinical Narrative:                   Expected Discharge Plan: Home/Self Care Barriers to Discharge: Continued Medical Work up   Patient Goals and CMS Choice        Expected Discharge Plan and Services Expected Discharge Plan: Home/Self Care       Living arrangements for the past 2 months: Single Family Home(one level) Expected Discharge Date: 01/12/19                        Prior Living Arrangements/Services Living arrangements for the past 2 months: Single Family Home(one level) Lives with:: Significant Other Patient language and need for interpreter reviewed:: Yes(no needs) Do you feel safe going back to the place where you live?: Yes      Need for Family Participation in Patient Care: No (Comment) Care giver support system in place?: Yes (comment)(states girlfriend working from home and can provide 24 hour supervision.) Current home services: DME(walker but doesnt use) Criminal Activity/Legal Involvement Pertinent to Current Situation/Hospitalization: No - Comment as needed  Activities of Daily Living Home Assistive Devices/Equipment: None ADL Screening (condition at time of admission) Patient's cognitive ability adequate to safely complete daily activities?: Yes Is the patient deaf or have difficulty hearing?: No Does the patient have difficulty seeing, even when wearing glasses/contacts?: No Does the patient have difficulty concentrating, remembering, or making decisions?: No Patient able to express need for assistance with ADLs?: Yes Does the patient have difficulty dressing or bathing?: No Independently performs ADLs?: Yes (appropriate for developmental age) Does the patient have difficulty walking or climbing stairs?:  No Weakness of Legs: None Weakness of Arms/Hands: None  Permission Sought/Granted                  Emotional Assessment Appearance:: Appears stated age Attitude/Demeanor/Rapport: Engaged Affect (typically observed): Accepting, Pleasant, Anxious Orientation: : Oriented to Self, Oriented to Place, Oriented to  Time, Oriented to Situation Alcohol / Substance Use: Alcohol Use Psych Involvement: No (comment)  Admission diagnosis:  Hyperbilirubinemia [E80.6] Patient Active Problem List   Diagnosis Date Noted  . Alcoholic hepatitis 09/32/3557  . Hyperbilirubinemia   . Acute on chronic alcoholic liver disease ([place])   . Medication monitoring encounter 01/19/2017  . Weakness of right lower extremity 11/19/2016  . 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 Abnormality of gait   . Post-operative pain   . Infective myositis of right thigh   . Coagulopathy ([place])   . DTs (delirium tremens) ([place])   . Transaminitis   . Infection 11/12/2016  . Alcoholic cirrhosis of liver without ascites ([place])   . Goals of care, counseling/discussion   . Palliative care by specialist   . Pyogenic arthritis of multiple sites ([place])   . Traumatic compartment syndrome of other sites   . Alcoholic liver failure ([place])   . Traumatic compartment syndrome of lower extremity ([place])   . 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 Delirium tremens ([place])   . Abnormal transaminases   . Alcoholic hepatitis without ascites   . ETOH abuse   . Abscess of right lower extremity   . Cellulitis and abscess of right lower extremity   . Hypernatremia   .  Cellulitis of right lower extremity 10/30/2016  . EtOH dependence ([place]) 10/30/2016  . Thrombocytopenia ([place]) 10/30/2016  . Cirrhosis ([place])   . Paraspinal abscess ([place])   . Chronic hepatitis C without hepatic coma ([place])   . Hepatitis   . Epidural abscess 10/29/2016  . Liver failure ([place]) 10/29/2016  . Sepsis ([place]) 10/29/2016  . Hyponatremia 10/29/2016   PCP:   Nicholes Rough, PA-C Pharmacy:   [place], [place] - [address] [place] [address] Phone: [phone] Fax: [phone]     Social Determinants of Health (SDOH) Interventions  Pt doesn't drive but girlfriend able to provide transportation. Pt doesn't take any home medications.  Readmission Risk Interventions No flowsheet data found.

## 2019-01-10 NOTE — Plan of Care (Signed)
Progressing towards goals

## 2019-01-10 NOTE — ED Notes (Signed)
Pt transported to US

## 2019-01-10 NOTE — ED Notes (Signed)
ED TO INPATIENT HANDOFF REPORT  ED Nurse Name and Phone #: [phone] Lucita Ferrara Name/Age/Gender [name] 38 y.o. male Room/Bed: 020C/020C  Code Status   Code Status: Full Code  Home/[name]/Other Home Patient oriented to: self, place, time and situation Is this baseline? Yes   Triage Complete: Triage complete  Chief Complaint N/V/D, orange   Triage Note Pt coming in with complaints of nausea, vomiting, and diarrhea for [ast several days. Upon assessment pt is very jaundice. Alert and oriented   Allergies Allergies  Allergen Reactions  . Acetaminophen Other (See Comments)    Due to liver issues    Level of Care/Admitting Diagnosis ED Disposition    ED Disposition Condition [facility] Area: [place] [[id number]]  Level of Care: Med-Surg [16]  Diagnosis: Alcoholic hepatitis [606301]  Admitting Physician: Patriciaann Clan [[id number]]  Attending Physician: Dorcas Mcmurray L [[id number]]  PT Class (Do Not Modify): Observation [104]  PT Acc Code (Do Not Modify): Observation [10022]       B Medical/Surgery History Past Medical History:  Diagnosis Date  . Alcoholic cirrhosis ([place])   . Cellulitis   . Cellulitis 10/2016   right leg  . Elevated liver function tests 10/2016  . 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 ETOH abuse   . Hepatitis C    Hepatitis C  . Paraspinal abscess ([place])   . Thrombocytopenia ([place]) 25/2018   Past Surgical History:  Procedure Laterality Date  . DECOMPRESSIVE LUMBAR LAMINECTOMY LEVEL 1 N/A 11/01/2016   Procedure: DECOMPRESSIVE POSTERIOR LUMBAR LAMINECTOMY LEVEL 4-5;  Surgeon: Melina Schools, MD;  Location: [place];  Service: Orthopedics;  Laterality: N/A;  . HERNIA REPAIR Left    INGUNIAL  . I&D EXTREMITY Right 11/01/2016   Procedure: IRRIGATION AND DEBRIDEMENT ABSCESS RIGHT THIGH;  Surgeon: Melina Schools, MD;  Location: [place];  Service: Orthopedics;  Laterality: Right;  . INCISION AND DRAINAGE OF WOUND N/A  01/06/2017   Procedure: IRRIGATION AND DEBRIDEMENT and placement of wound vac;  Surgeon: Melina Schools, MD;  Location: [facility];  Service: Orthopedics;  Laterality: N/A;  . INCISION AND DRAINAGE OF WOUND N/A 12/02/2016   Procedure: WASHOUT AND CLOSURE OF WOUND;  Surgeon: Melina Schools, MD;  Location: [place];  Service: Orthopedics;  Laterality: N/A;  . LUMBAR WOUND DEBRIDEMENT N/A 11/12/2016   Procedure: LUMBAR WOUND DEBRIDEMENT;  Surgeon: Melina Schools, MD;  Location: [place];  Service: Orthopedics;  Laterality: N/A;     A IV Location/Drains/Wounds Patient Lines/Drains/Airways Status   Active Line/Drains/Airways    Name:   Placement date:   Placement time:   Site:   Days:   Peripheral IV 01/09/19 Right Antecubital   01/09/19    2150    Antecubital   1   Closed System Drain Medial Back Other (Comment)   11/12/16    0900    Back   789   Closed System Drain 1 Back Other (Comment) 7 Fr.   12/02/16    1628    Back   769   Negative Pressure Wound Therapy Back Medial   11/18/16    1256    -   783   Negative Pressure Wound Therapy Back Posterior   01/06/17    1433    -   734   Incision (Closed) 11/12/16 Back Other (Comment)   11/12/16    1135     789   Incision (Closed) 11/12/16 Thigh Right   11/12/16  1252     789   Incision (Closed) 12/02/16 Back   12/02/16    1541     769   Incision (Closed) 01/06/17 Back Other (Comment)   01/06/17    1503     734          Intake/Output Last 24 hours No intake or output data in the 24 hours ending 01/10/19 0059  Labs/Imaging Results for orders placed or performed during the hospital encounter of 01/09/19 (from the past 48 hour(s))  CBC with Differential     Status: Abnormal   Collection Time: 01/09/19  9:50 PM  Result Value Ref Range   WBC 8.2 4.0 - 10.5 K/uL   RBC 4.08 (L) 4.22 - 5.81 MIL/uL   Hemoglobin 13.6 13.0 - 17.0 g/dL   HCT 38.3 (L) 39.0 - 52.0 %   MCV 93.9 80.0 - 100.0 fL   MCH 33.3 26.0 - 34.0 pg   MCHC 35.5 30.0 - 36.0 g/dL   RDW 18.4 (H)  11.5 - 15.5 %   Platelets 52 (L) 150 - 400 K/uL    Comment: REPEATED TO VERIFY PLATELET COUNT CONFIRMED BY SMEAR SPECIMEN CHECKED FOR CLOTS Immature Platelet Fraction may be clinically indicated, consider ordering this additional test [id number]    nRBC 0.2 0.0 - 0.2 %   Neutrophils Relative % 60 %   Neutro Abs 5.0 1.7 - 7.7 K/uL   Lymphocytes Relative 16 %   Lymphs Abs 1.3 0.7 - 4.0 K/uL   Monocytes Relative 20 %   Monocytes Absolute 1.7 (H) 0.1 - 1.0 K/uL   Eosinophils Relative 1 %   Eosinophils Absolute 0.1 0.0 - 0.5 K/uL   Basophils Relative 2 %   Basophils Absolute 0.1 0.0 - 0.1 K/uL   Immature Granulocytes 1 %   Abs Immature Granulocytes 0.09 (H) 0.00 - 0.07 K/uL    Comment: Performed at [facility], [address], [place], [place] [address]  Comprehensive metabolic panel     Status: Abnormal   Collection Time: 01/09/19  9:50 PM  Result Value Ref Range   Sodium 130 (L) 135 - 145 mmol/L   Potassium 3.0 (L) 3.5 - 5.1 mmol/L   Chloride 92 (L) 98 - 111 mmol/L   CO2 23 22 - 32 mmol/L   Glucose, Bld 143 (H) 70 - 99 mg/dL   BUN 7 6 - 20 mg/dL   Creatinine, Ser 0.57 (L) 0.61 - 1.24 mg/dL   Calcium 8.2 (L) 8.9 - 10.3 mg/dL   Total Protein 7.4 6.5 - 8.1 g/dL   Albumin 2.4 (L) 3.5 - 5.0 g/dL   AST 250 (H) 15 - 41 U/L   ALT 80 (H) 0 - 44 U/L   Alkaline Phosphatase 121 38 - 126 U/L   Total Bilirubin 36.1 (HH) 0.3 - 1.2 mg/dL    Comment: RESULTS CONFIRMED BY MANUAL DILUTION CRITICAL RESULT CALLED TO, READ BACK BY AND VERIFIED WITH: FLOYD D,MD 01/09/19 2253 WAYK    GFR calc non Af Amer >60 >60 mL/min   GFR calc Af Amer >60 >60 mL/min   Anion gap 15 5 - 15    Comment: Performed at [facility], [place] [address], [place], [place] [address]  Lipase, blood     Status: Abnormal   Collection Time: 01/09/19  9:50 PM  Result Value Ref Range   Lipase 786 (H) 11 - 51 U/L    Comment: RESULTS CONFIRMED BY MANUAL DILUTION Performed at [facility], [place] [address], [place], [place] [address]   Protime-INR     Status: Abnormal   Collection Time: 01/09/19  9:50 PM  Result Value Ref Range   Prothrombin Time 25.9 (H) 11.4 - 15.2 seconds   INR 2.4 (H) 0.8 - 1.2    Comment: (NOTE) INR goal varies based on device and disease states. Performed at [facility], [place] [address] [place], [place] [address]    US Abdomen Limited Ruq  Result Date: 01/10/2019 CLINICAL DATA:  Hyperbilirubinemia EXAM: ULTRASOUND ABDOMEN LIMITED RIGHT UPPER QUADRANT COMPARISON:  None. FINDINGS: Gallbladder: Gallbladder is partially distended with thickened irregular wall measuring up to 11 mm. Minimal pericholecystic fluid is noted. No definitive cholelithiasis is seen. Gallbladder sludge is noted. Common bile duct: Diameter: 4.7 mm Liver: Diffuse increased echogenicity is noted consistent with fatty infiltration. No focal mass is noted. Poor flow is reversed and hepatofugal. IMPRESSION: Increased gallbladder wall thickening with mild pericholecystic fluid. This may represent acute cholecystitis in the appropriate clinical setting. HIDA scan may be helpful to assess for gallbladder function. Fatty liver. Hepatofugal portal flow. Electronically Signed   By: Inez Catalina M.D.   On: 01/10/2019 00:53    Pending Labs Unresulted Labs (From admission, onward)    Start     Ordered   01/10/19 0500  Comprehensive metabolic panel  Tomorrow morning,   R     01/10/19 0021   01/10/19 0500  Protime-INR  Tomorrow morning,   R     01/10/19 0021   01/10/19 0500  HIV antibody (Routine Testing)  Tomorrow morning,   R     01/10/19 0029   01/10/19 0024  Ammonia  Add-on,   R     01/10/19 0023   01/09/19 2352  Ethanol  Add-on,   R     01/09/19 2352          Vitals/Pain Today's Vitals   01/09/19 2324 01/09/19 2330 01/10/19 0000 01/10/19 0015  BP: 134/76 134/80 (!) 149/90   Pulse: 94 95 96 93  Resp: 19 (!) 26 13 (!) 21  Temp:      TempSrc:      SpO2: 93% 92% 93% 94%  Weight:       Height:      PainSc:        Isolation Precautions No active isolations  Medications Medications  LORazepam (ATIVAN) injection 0-4 mg (has no administration in time range)    Or  LORazepam (ATIVAN) tablet 0-4 mg (has no administration in time range)  LORazepam (ATIVAN) injection 0-4 mg (has no administration in time range)    Or  LORazepam (ATIVAN) tablet 0-4 mg (has no administration in time range)  thiamine (VITAMIN B-1) tablet 100 mg (has no administration in time range)    Or  thiamine (B-1) injection 100 mg (has no administration in time range)  multivitamin with minerals tablet 1 tablet (has no administration in time range)  folic acid (FOLVITE) tablet 1 mg (has no administration in time range)  lactated ringers infusion (has no administration in time range)  ondansetron (ZOFRAN) injection 4 mg (has no administration in time range)  sodium chloride 0.9 % bolus 1,000 mL (1,000 mLs Intravenous New Bag/Given 01/09/19 2150)  ondansetron (ZOFRAN) injection 4 mg (4 mg Intravenous Given 01/09/19 2201)  chlordiazePOXIDE (LIBRIUM) capsule 100 mg (100 mg Oral Given 01/09/19 2229)    Mobility walks Low fall risk   Focused Assessments NA   R Recommendations: See Admitting  Provider Note  Report given to:   Additional Notes: N/A

## 2019-01-10 NOTE — Consult Note (Addendum)
Consultation  Referring Provider:  Teaching service medicine Primary Care Physician:  Nicholes Rough, PA-C Primary Gastroenterologist: Althia Forts, had previously seen her neurologist Dr. Peterson Ao Pleasant/digestive [facility]  Reason for consultation ;acute hepatitis  HPI: [name] is a 38 y.o. male who was admitted early this morning through the emergency room after he presented with complaints of this, nausea and vomiting.  Patient has history of hepatitis C and had completed a course of Mayvret he believes over a year ago, and believes that he did clear the virus but is not certain.  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 Says he has been able to stop drinking successfully several times in the past, the longest was for about 8 months.  He apparently had not been drinking for a few months and then started drinking again 2 months ago.  He was consuming 1 pint to 1/5/day.  He says over the past couple of weeks he has been trying to wean himself back down.  Last week he started feeling ill with episodes of nausea and vomiting, no fever chills no abdominal pain and also had some associated constipation.  He became aware that he was jaundiced over the past couple of days and decided to come to the emergency room. He denies use of any recreational drugs, no NSAIDs or Tylenol.  Upper abdominal ultrasound done early this morning showed some gallbladder wall thickening with mild pericholecystic fluid and liver read as fatty liver, CBD of 4.7 mm.  Labs have been reviewed T bili of 36 on presentation, EtOH level 100, lipase 786, INR 2.7 and platelets of 32.  Has been hemodynamically stable and is mentating well.   Past Medical History:  Diagnosis Date  . Alcoholic cirrhosis ([place])   . Cellulitis   . Cellulitis 10/2016   right leg  . Elevated liver function tests 10/2016  . 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 ETOH abuse   .  Hepatitis C    Hepatitis C  . Paraspinal abscess ([place])   . Thrombocytopenia ([place]) 25/2018    Past Surgical History:  Procedure Laterality Date  . DECOMPRESSIVE LUMBAR LAMINECTOMY LEVEL 1 N/A 11/01/2016   Procedure: DECOMPRESSIVE POSTERIOR LUMBAR LAMINECTOMY LEVEL 4-5;  Surgeon: Melina Schools, MD;  Location: [place] [place];  Service: Orthopedics;  Laterality: N/A;  . HERNIA REPAIR Left    INGUNIAL  . I&D EXTREMITY Right 11/01/2016   Procedure: IRRIGATION AND DEBRIDEMENT ABSCESS RIGHT THIGH;  Surgeon: Melina Schools, MD;  Location: [place];  Service: Orthopedics;  Laterality: Right;  . INCISION AND DRAINAGE OF WOUND N/A 01/06/2017   Procedure: IRRIGATION AND DEBRIDEMENT and placement of wound vac;  Surgeon: Melina Schools, MD;  Location: [place];  Service: Orthopedics;  Laterality: N/A;  . INCISION AND DRAINAGE OF WOUND N/A 12/02/2016   Procedure: WASHOUT AND CLOSURE OF WOUND;  Surgeon: Melina Schools, MD;  Location: [place];  Service: Orthopedics;  Laterality: N/A;  . LUMBAR WOUND DEBRIDEMENT N/A 11/12/2016   Procedure: LUMBAR WOUND DEBRIDEMENT;  Surgeon: Melina Schools, MD;  Location: [place];  Service: Orthopedics;  Laterality: N/A;    Prior to Admission medications   Medication Sig Start Date End Date Taking? Authorizing Provider  ibuprofen (ADVIL,MOTRIN) 200 MG tablet Take 400 mg by mouth every 6 (six) hours as needed for headache.   Yes [provider]  protein supplement shake (PREMIER PROTEIN) LIQD Take 325 mLs (11 oz total) by mouth 2 (two)  times daily between meals. 11/17/16  Yes Elgergawy, Silver Huguenin, MD  tetrahydrozoline-zinc (VISINE-AC) 0.05-0.25 % ophthalmic solution Place 2 drops into both eyes daily as needed (itchy eyes).   Yes [provider]  chlordiazePOXIDE (LIBRIUM) 25 MG capsule 50mg  PO TID x 1D, then 25-50mg  PO BID X 1D, then 25-50mg  PO QD X 1D Patient not taking: Reported on 01/10/2019 07/28/18   Caccavale, Sophia, PA-C  methocarbamol (ROBAXIN) 500 MG tablet Take 1 tablet (500 mg  total) by mouth 2 (two) times daily. Patient not taking: Reported on 01/10/2019 09/29/18   Okey Regal, PA-C  Multiple Vitamin (MULTIVITAMIN WITH MINERALS) TABS tablet Take 1 tablet by mouth daily. Patient not taking: Reported on 11/10/2017 11/17/16   Elgergawy, Silver Huguenin, MD  ondansetron (ZOFRAN ODT) 4 MG disintegrating tablet Take 1 tablet (4 mg total) by mouth every 8 (eight) hours as needed for nausea or vomiting. Patient not taking: Reported on 01/10/2019 11/10/17   Fredia Sorrow, MD  oxyCODONE (OXY IR/ROXICODONE) 5 MG immediate release tablet Take 1 tablet (5 mg total) by mouth every 4 (four) hours as needed for severe pain. Patient not taking: Reported on 11/10/2017 01/06/17   Melina Schools, MD  potassium chloride SA (K-DUR,KLOR-CON) 20 MEQ tablet Take 1 tablet (20 mEq total) by mouth daily for 7 days. 07/28/18 08/04/18  Caccavale, Sophia, PA-C  promethazine (PHENERGAN) 25 MG tablet Take 1 tablet (25 mg total) by mouth every 6 (six) hours as needed for nausea or vomiting. Patient not taking: Reported on 07/28/2018 11/10/17   Fredia Sorrow, MD    Current Facility-Administered Medications  Medication Dose Route Frequency Provider Last Rate Last Dose  . folic acid (FOLVITE) tablet 1 mg  1 mg Oral Daily Beard, Samantha N, DO   1 mg at 01/10/19 0853  . lactated ringers infusion   Intravenous Continuous Darrelyn Hillock N, DO 100 mL/hr at 01/10/19 0206    . lactulose (CHRONULAC) 10 GM/15ML solution 10 g  10 g Oral Daily Sela Hilding, MD   10 g at 01/10/19 0853  . LORazepam (ATIVAN) injection 0-4 mg  0-4 mg Intravenous Q6H Darrelyn Hillock N, DO       Or  . LORazepam (ATIVAN) tablet 0-4 mg  0-4 mg Oral Q6H Beard, Samantha N, DO      . [START ON 01/12/2019] LORazepam (ATIVAN) injection 0-4 mg  0-4 mg Intravenous Q12H Darrelyn Hillock N, DO       Or  . Derrill Memo ON 01/12/2019] LORazepam (ATIVAN) tablet 0-4 mg  0-4 mg Oral Q12H Beard, Samantha N, DO      . multivitamin with minerals tablet 1  tablet  1 tablet Oral Daily Darrelyn Hillock N, DO   1 tablet at 01/10/19 6606  . ondansetron (ZOFRAN) injection 4 mg  4 mg Intravenous Q8H PRN Higinio Plan, Samantha N, DO      . potassium chloride SA (K-DUR,KLOR-CON) CR tablet 40 mEq  40 mEq Oral TID Darrelyn Hillock N, DO   40 mEq at 01/10/19 0852  . thiamine (VITAMIN B-1) tablet 100 mg  100 mg Oral Daily Higinio Plan, Samantha N, DO   100 mg at 01/10/19 3016   Or  . thiamine (B-1) injection 100 mg  100 mg Intravenous Daily Darrelyn Hillock N, DO        Allergies as of 01/09/2019 - Review Complete 01/09/2019  Allergen Reaction Noted  . Acetaminophen Other (See Comments) 11/27/2016    Family History  Problem Relation Age of Onset  . Diabetes Mother  Social History   Socioeconomic History  . Marital status: Single    Spouse name: Not on file  . Number of children: Not on file  . Years of education: Not on file  . Highest education level: Not on file  Occupational History  . Not on file  Social Needs  . Financial resource strain: Not on file  . Food insecurity:    Worry: Not on file    Inability: Not on file  . Transportation needs:    Medical: Not on file    Non-medical: Not on file  Tobacco Use  . Smoking status: Former Smoker    Last attempt to quit: 01/04/2017    Years since quitting: 2.0  . Smokeless tobacco: Never Used  . Tobacco comment: was a social smoker- maybe 5 a week  Substance and Sexual Activity  . Alcohol use: Yes    Comment: occasionally  . Drug use: No    Comment: as a teen a few times  . Sexual activity: Not on file  Lifestyle  . Physical activity:    Days per week: Not on file    Minutes per session: Not on file  . Stress: Not on file  Relationships  . Social connections:    Talks on phone: Not on file    Gets together: Not on file    Attends religious service: Not on file    Active member of club or organization: Not on file    Attends meetings of clubs or organizations: Not on file    Relationship  status: Not on file  . Intimate partner violence:    Fear of current or ex partner: Not on file    Emotionally abused: Not on file    Physically abused: Not on file    Forced sexual activity: Not on file  Other Topics Concern  . Not on file  Social History Narrative  . Not on file    Review of Systems: Pertinent positive and negative review of systems were noted in the above HPI section.  All other review of systems was otherwise negative.  Physical Exam: Vital signs in last 24 hours: Temp:  [98.3 F (36.8 C)-98.7 F (37.1 C)] 98.3 F (36.8 C) (04/14 0812) Pulse Rate:  [93-110] 98 (04/14 0812) Resp:  [13-26] 19 (04/14 0812) BP: (128-150)/(65-103) 133/83 (04/14 0812) SpO2:  [92 %-100 %] 98 % (04/14 0812) Weight:  [113.4 kg] 113.4 kg (04/13 2146)   General:   Alert,  Well-developed, well-nourished, pleasant and cooperative in NAD Head:  Normocephalic and atraumatic. Eyes:  Sclera clear, no icterus.   Conjunctiva pink. Ears:  Normal auditory acuity. Nose:  No deformity, discharge,  or lesions. Mouth:  No deformity or lesions.   Neck:  Supple; no masses or thyromegaly. Lungs:  Clear throughout to auscultation.   No wheezes, crackles, or rhonchi. Heart:  Regular rate and rhythm; no murmurs, clicks, rubs,  or gallops. Abdomen:  Soft,nontender, BS active,nonpalp mass or hsm.   Rectal:  Deferred  Msk:  Symmetrical without gross deformities. . Pulses:  Normal pulses noted. Extremities:  Without clubbing or edema. Neurologic:  Alert and  oriented x4;  grossly normal neurologically. Skin:  Intact without significant lesions or rashes.. Psych:  Alert and cooperative. Normal mood and affect.  Intake/Output from previous day: No intake/output data recorded. Intake/Output this shift: Total I/O In: -  Out: 380 [Urine:380]  Lab Results: Recent Labs    01/09/19 2150 01/10/19 0718  WBC 8.2 6.3  HGB 13.6 12.4*  HCT 38.3* 34.7*  PLT 52* 32*   BMET Recent Labs    01/09/19  2150 01/10/19 0432  NA 130* 132*  K 3.0* 2.7*  CL 92* 94*  CO2 23 26  GLUCOSE 143* 105*  BUN 7 6  CREATININE 0.57* 0.58*  CALCIUM 8.2* 8.1*   LFT Recent Labs    01/10/19 0432  PROT 6.4*  ALBUMIN 2.1*  AST 217*  ALT 64*  ALKPHOS 107  BILITOT 31.1*   PT/INR Recent Labs    01/09/19 2150 01/10/19 0432  LABPROT 25.9* 28.0*  INR 2.4* 2.7*      IMPRESSION:   #52 38 year old white male alcoholic admitted earlier today presenting with jaundice, and bilirubin of 36.  Patient has been feeling ill over the past week with intermittent nausea and vomiting.  Upper abdominal ultrasound showed gallbladder wall thickening however reviewing prior imaging he has had similar findings even last year on CT.  He has no complaints of abdominal pain. It is unlikely that he has acute cholecystitis, wall thickening and pericholecystic fluid can be seen in setting of chronic liver disease  Labs and clinical picture are very consistent with acute alcoholic hepatitis-severe. He has associated coagulopathy and thrombocytopenia which are very concerning. No current evidence of encephalopathy.  MELD NA= 33 Discriminant function score= 86.4  #2 EtOH withdrawal-on CIWA protocol # 3 history of hepatitis C that is post treatment-we will confirm eradication   PLAN: #1 do not believe HIDA scan needed at this time #2 start clear liquids #3 antiemetics #4 follow labs including platelet count and pro time/INR daily #5 vitamin K daily x3 #6 Patient meets criteria for treatment with steroids, start prednisolone 40 mg p.o. daily for acute severe alcoholic hepatitis .Generally treated for 1 month if responsive.  Patient does have a gastroenterologist in [place] and outpatient follow-up will be needed and hopefully can be arranged with digestive health in [place]/Dr. Pleasant  Would benefit from EtOH rehab, he says he does have insurance.  Please investigate.  Certain whether this would be an  option currently due to the COVID situation at the very least he needs to get involved with AA and this was discussed with him.  #7  Hep C PCR RNA quant  Thank you, we will follow with you   Mariaceleste Herrera  01/10/2019, 11:15 AM

## 2019-01-10 NOTE — Progress Notes (Signed)
MD notified of patient critical potassium level of 2.7 will continue to monitor.

## 2019-01-10 NOTE — Progress Notes (Signed)
Family Medicine Teaching Service Daily Progress Note Intern Pager: [phone]  Patient name: [name] Medical record number: [id number] Date of birth: [birth year] Age: 38 y.o. Gender: male  Primary Care Provider: Nicholes Rough, PA-C Consultants: GI Code Status: Full  Pt Overview and Major Events to Date:  4/14 - admit for acute hepatitis  Assessment and Plan: [name] is a 38 y.o. male presenting with jaundice in the setting of recent vomiting/diarrhea, concerning for acute on chronic alcoholic hepatitis. PMH is significant for hepatitis C s/p Mavyret therapy, alcoholism, liver cirrhosis, alcoholic induced pancreatitis, and thrombocytopenia.   Acute alcoholic hepatitis MELD-Na score: 32 at 01/10/2019  4:32 AM MELD score: 31 at 01/10/2019  4:32 AM Calculated from: Serum Creatinine: 0.58 mg/dL (Rounded to 1 mg/dL) at 01/10/2019  4:32 AM Serum Sodium: 132 mmol/L at 01/10/2019  4:32 AM Total Bilirubin: 31.1 mg/dL at 01/10/2019  4:32 AM INR(ratio): 2.7 at 01/10/2019  4:32 AM Age: 58 years No significant changes since hospital admission. -Consult GI -follow up am ammonia -consider DC lactulose -Zofran PRN -Daily CMP, PT/INR  Possible cholecystitis Right upper quadrant ultrasound showed increased gallbladder wall thickening with mild pericholecystic fluid.  No significant tenderness on physical exam.  No nausea/vomiting at present.  Radiology noted that HIDA scan may be helpful. -N.p.o. -Consult GI re HIDA scan  History of hep C He reports being treated for hep C.  Hypokalemia Potassium to 2.7 on the morning of 4/14.  Likely secondary to alcohol use.  Repleted with p.o. potassium. -Follow-up p.m. BMP  Hyponatremia Likely secondary to chronic alcohol use.  Sodium this morning 132 4/14. -Follow-up p.m. BMP  Alcohol withdrawal Last drink 4/13 p.m.  History of DT requiring Precedex and ICU care.  CIWA scores 3, 0.  No Ativan given since admission.  CIWA 2 on exam  this morning. -Continue CIWA scores -Ativan per protocol  Pancreatitis, acute Complaint of nausea/vomiting/abdominal pain on exam.  Increased lipase may reflect inflammation of the GI tract. -Continue IVF at 100 mL/h -Diet as tolerated -Zofran PRN  Thrombocytopenia, chronic -Monitor daily CBC -SCDs  FEN/GI: general diet PPx: SCDs  Disposition: 2-3 additional days of hospitalization anticipated.  Subjective:  No new symptoms since hospital admission.  No acute events overnight.  He denies nausea/vomiting/anxiety/irritability.  He is familiar with alcohol withdrawal and does not feel that he is withdrawing at present.  He was informed that his condition is very serious and is likely directly related to his chronic drinking.  Objective: Temp:  [98.3 F (36.8 C)-98.7 F (37.1 C)] 98.3 F (36.8 C) (04/14 0350) Pulse Rate:  [93-110] 99 (04/14 0350) Resp:  [13-26] 17 (04/14 0350) BP: (128-150)/(65-103) 129/65 (04/14 0350) SpO2:  [92 %-100 %] 100 % (04/14 0350) Weight:  [113.4 kg] 113.4 kg (04/13 2146)  General: Alert and cooperative and appears to be in no acute distress.  Significant global jaundice.  No anasarca. HEENT: Significant scleral icterus Cardio: Normal S1 and S2, no S3 or S4. Rhythm is regular.  3/6 systolic murmur loudest left sternal border.   Pulm: Clear to auscultation bilaterally, no crackles, wheezing, or diminished breath sounds. Normal respiratory effort Abdomen: Bowel sounds normal.  Protuberant stomach.  No focal tenderness.  Nonpalpable liver. Extremities: No peripheral edema. Warm/ well perfused.  Strong radial pulses.  Laboratory: Recent Labs  Lab 01/09/19 2150  WBC 8.2  HGB 13.6  HCT 38.3*  PLT 52*   Recent Labs  Lab 01/09/19 2150 01/10/19 0432  NA 130* 132*  K  3.0* 2.7*  CL 92* 94*  CO2 23 26  BUN 7 6  CREATININE 0.57* 0.58*  CALCIUM 8.2* 8.1*  PROT 7.4 6.4*  BILITOT 36.1* 31.1*  ALKPHOS 121 107  ALT 80* 64*  AST 250* 217*  GLUCOSE  143* 105*    Imaging/Diagnostic Tests: US Abdomen Limited Ruq  Result Date: 01/10/2019 CLINICAL DATA:  Hyperbilirubinemia EXAM: ULTRASOUND ABDOMEN LIMITED RIGHT UPPER QUADRANT COMPARISON:  None. FINDINGS: Gallbladder: Gallbladder is partially distended with thickened irregular wall measuring up to 11 mm. Minimal pericholecystic fluid is noted. No definitive cholelithiasis is seen. Gallbladder sludge is noted. Common bile duct: Diameter: 4.7 mm Liver: Diffuse increased echogenicity is noted consistent with fatty infiltration. No focal mass is noted. Poor flow is reversed and hepatofugal. IMPRESSION: Increased gallbladder wall thickening with mild pericholecystic fluid. This may represent acute cholecystitis in the appropriate clinical setting. HIDA scan may be helpful to assess for gallbladder function. Fatty liver. Hepatofugal portal flow. Electronically Signed   By: Inez Catalina M.D.   On: 01/10/2019 00:53     Matilde Haymaker, MD 01/10/2019, 6:37 AM PGY-1, [place] Intern pager: [phone], text pages welcome

## 2019-01-10 NOTE — ED Notes (Signed)
Report given to 3W RN. All questions asnwered

## 2019-01-11 LAB — COMPREHENSIVE METABOLIC PANEL
ALT: 66 U/L — ABNORMAL HIGH (ref 0–44)
AST: 191 U/L — ABNORMAL HIGH (ref 15–41)
Albumin: 2 g/dL — ABNORMAL LOW (ref 3.5–5.0)
Alkaline Phosphatase: 114 U/L (ref 38–126)
Anion gap: 10 (ref 5–15)
BUN: 5 mg/dL — ABNORMAL LOW (ref 6–20)
CO2: 27 mmol/L (ref 22–32)
Calcium: 8.6 mg/dL — ABNORMAL LOW (ref 8.9–10.3)
Chloride: 98 mmol/L (ref 98–111)
Creatinine, Ser: 0.65 mg/dL (ref 0.61–1.24)
GFR calc Af Amer: >60 mL/min (ref >60)
GFR calc non Af Amer: >60 mL/min (ref >60)
Glucose, Bld: 121 mg/dL — ABNORMAL HIGH (ref 70–99)
Potassium: 3.4 mmol/L — ABNORMAL LOW (ref 3.5–5.1)
Sodium: 135 mmol/L (ref 135–145)
Total Bilirubin: 32.8 mg/dL (ref 0.3–1.2)
Total Protein: 6.6 g/dL (ref 6.5–8.1)

## 2019-01-11 LAB — AMMONIA: Ammonia: 32 umol/L (ref 9–35)

## 2019-01-11 LAB — CBC
HCT: 37.3 % — ABNORMAL LOW (ref 39.0–52.0)
Hemoglobin: 13 g/dL (ref 13.0–17.0)
MCH: 33 pg (ref 26.0–34.0)
MCHC: 34.9 g/dL (ref 30.0–36.0)
MCV: 94.7 fL (ref 80.0–100.0)
Platelets: 38 10*3/uL — ABNORMAL LOW (ref 150–400)
RBC: 3.94 MIL/uL — ABNORMAL LOW (ref 4.22–5.81)
RDW: 19 % — ABNORMAL HIGH (ref 11.5–15.5)
WBC: 7.8 10*3/uL (ref 4.0–10.5)
nRBC: 0 % (ref 0.0–0.2)

## 2019-01-11 LAB — PROTIME-INR
INR: 2.9 — ABNORMAL HIGH (ref 0.8–1.2)
Prothrombin Time: 30.2 seconds — ABNORMAL HIGH (ref 11.4–15.2)

## 2019-01-11 MED ORDER — LORAZEPAM 1 MG PO TABS
0.0000 mg | ORAL_TABLET | ORAL | Status: DC
  Administered 2019-01-12 – 2019-01-13 (×3): 2 mg via ORAL
  Filled 2019-01-11 (×3): qty 2

## 2019-01-11 MED ORDER — POTASSIUM CHLORIDE CRYS ER 20 MEQ PO TBCR
40.0000 meq | EXTENDED_RELEASE_TABLET | Freq: Once | ORAL | Status: AC
  Administered 2019-01-11: 09:00:00 40 meq via ORAL
  Filled 2019-01-11: qty 2

## 2019-01-11 MED ORDER — SODIUM CHLORIDE 0.9% FLUSH: 10.0000 mL | INTRAVENOUS | Status: DC | PRN

## 2019-01-11 MED ORDER — LORAZEPAM 2 MG/ML IJ SOLN
0.0000 mg | INTRAMUSCULAR | Status: DC
  Administered 2019-01-12 (×2): 2 mg via INTRAVENOUS
  Filled 2019-01-11 (×3): qty 1

## 2019-01-11 MED ORDER — CHLORDIAZEPOXIDE HCL 25 MG PO CAPS
50.0000 mg | ORAL_CAPSULE | Freq: Three times a day (TID) | ORAL | Status: DC
  Administered 2019-01-11 – 2019-01-15 (×10): 50 mg via ORAL
  Filled 2019-01-11 (×11): qty 2

## 2019-01-11 MED ORDER — LORAZEPAM 2 MG/ML IJ SOLN
2.0000 mg | Freq: Once | INTRAMUSCULAR | Status: AC
  Administered 2019-01-12: 01:00:00 2 mg via INTRAVENOUS
  Filled 2019-01-11: qty 1

## 2019-01-11 MED ORDER — VITAMIN K1 1 MG/0.5ML IJ SOLN
1.0000 mg | Freq: Every morning | INTRAMUSCULAR | Status: AC
  Administered 2019-01-12 – 2019-01-13 (×2): 1 mg via INTRAMUSCULAR
  Filled 2019-01-11 (×4): qty 0.5

## 2019-01-11 NOTE — Progress Notes (Signed)
Patient ID: [name], male   DOB: May 12, 1981, 38 y.o.   MRN: [id number]     Progress Note   Subjective   Was hallucinating when I walked  into room - when asked he says he though somebody  was behind him. Denies feeling anxious ,jittery etc  - nurse reports requiring increasing doses of Ativan No abdominal  pain, no N/V   Objective   Vital signs in last 24 hours: Temp:  [98.1 F (36.7 C)-98.9 F (37.2 C)] 98.1 F (36.7 C) (04/15 0830) Pulse Rate:  [65-101] 65 (04/15 0830) Resp:  [18-20] 18 (04/15 0830) BP: (120-146)/(78-92) 120/79 (04/15 0830) SpO2:  [96 %-99 %] 99 % (04/15 0830) Weight:  [113.4 kg] 113.4 kg (04/14 1141) Last BM Date: 01/10/19 General:    White male in NAD, very jaundiced , sl tremulous Heart:  Regular rate and rhythm; no murmurs Lungs: Respirations even and unlabored, lungs CTA bilaterally Abdomen:  Soft, obese ,nontender and nondistended. Normal bowel sounds. Extremities:  Without edema. Neurologic:  Alert and oriented to person, place - hallucinating Psych:  Cooperative. Normal mood and affect.  Intake/Output from previous day: 04/14 0701 - 04/15 0700 In: 1240 [P.O.:240; I.V.:1000] Out: 2760 [Urine:2760] Intake/Output this shift: Total I/O In: 240 [P.O.:240] Out: -   Lab Results: Recent Labs    01/09/19 2150 01/10/19 0718 01/11/19 0322  WBC 8.2 6.3 7.8  HGB 13.6 12.4* 13.0  HCT 38.3* 34.7* 37.3*  PLT 52* 32* 38*   BMET Recent Labs    01/09/19 2150 01/10/19 0432 01/11/19 0322  NA 130* 132* 135  K 3.0* 2.7* 3.4*  CL 92* 94* 98  CO2 23 26 27   GLUCOSE 143* 105* 121*  BUN 7 6 5*  CREATININE 0.57* 0.58* 0.65  CALCIUM 8.2* 8.1* 8.6*   LFT Recent Labs    01/11/19 0322  PROT 6.6  ALBUMIN 2.0*  AST 191*  ALT 66*  ALKPHOS 114  BILITOT 32.8*   PT/INR Recent Labs    01/10/19 0432 01/11/19 0322  LABPROT 28.0* 30.2*  INR 2.7* 2.9*    Studies/Results: US Abdomen Limited Ruq  Result Date: 01/10/2019 CLINICAL DATA:   Hyperbilirubinemia EXAM: ULTRASOUND ABDOMEN LIMITED RIGHT UPPER QUADRANT COMPARISON:  None. FINDINGS: Gallbladder: Gallbladder is partially distended with thickened irregular wall measuring up to 11 mm. Minimal pericholecystic fluid is noted. No definitive cholelithiasis is seen. Gallbladder sludge is noted. Common bile duct: Diameter: 4.7 mm Liver: Diffuse increased echogenicity is noted consistent with fatty infiltration. No focal mass is noted. Poor flow is reversed and hepatofugal. IMPRESSION: Increased gallbladder wall thickening with mild pericholecystic fluid. This may represent acute cholecystitis in the appropriate clinical setting. HIDA scan may be helpful to assess for gallbladder function. Fatty liver. Hepatofugal portal flow. Electronically Signed   By: Inez Catalina M.D.   On: 01/10/2019 00:53       Assessment / Plan:    #79  38 year old white male alcoholic with severe acute alcoholic hepatitis, at high risk for fulminant hepatic failure. Discriminant function score yesterday was 86 and patient was started on prednisolone.40 mg po daily yesterday  Bilirubin is trending down, however INR continues to rise which is very concerning. No definite encephalopathy Will give Vit K   #2 EtOH withdrawal, last drink 01/09/2019-now requiring increasing doses of Ativan and observed to be hallucinating intermittently  has had admission in the past with EtOH withdrawal requiring Precedex  Management as per medicine service  #3 history of hepatitis C status post treatment-hep  C PCR RNA quant pending         Active Problems:   Alcoholic hepatitis     LOS: 1 day      01/11/2019, 10:21 AM

## 2019-01-11 NOTE — Progress Notes (Signed)
Pt removed midline. Midline was intact, bleeding stopped. Pt cleaned and sheets changed. Pt A&O x3. Pt seems to be more agitated. Teaching service notified. No plans to replaced midline at this time. Nurse will continue to monitor. [place]

## 2019-01-11 NOTE — Progress Notes (Addendum)
Family Medicine Teaching Service Daily Progress Note Intern Pager: [phone]  Patient name: Hernandez Losasso Medical record number: [id number] Date of birth: [birth year] Age: 38 y.o. Gender: male  Primary Care Provider: Nicholes Rough, PA-C Consultants: GI Code Status: Full  Pt Overview and Major Events to Date:  4/14 - admit for acute hepatitis  Assessment and Plan: Quintarius Ferns is a 38 y.o. male presented with jaundice 2/2 acute on chronic alcoholic hepatitis. PMH is significant for hepatitis C s/p Mavyret therapy, alcoholism, liver cirrhosis, alcoholic induced pancreatitis, and thrombocytopenia.   Acute alcoholic hepatitis. MELD score 33 with poor prognosis and profound cholestasis and jaundice. Ammonia improved at 32 from 56. Mentating well this morning. GI recs state that presentation is inconsistent with acute cholecystitis but could have element of pancreatitis -continue prednisolone (4/14-) -per GI continue steroids and monitor response, will likely need at least 1 month and will need coordination with outpt hepatologist for close follow up. Monitor LFTs and INR closely. No need for HIDA.  -Vitamin K (4/14-4/17) per GI -Zofran PRN -Daily CMP, PT/INR -continue lactulose qd  Alcohol withdrawal Last drink 01/09/19 and patient states today that he is interested in cessation. He has required precedex in the past making him high risk for withdrawal. CIWAs overnight 0, 3, 12. -s/p librium 100mg  4/13, start taper with 50mg  q8h today and redose tomorrow -monitor on CIWA with prn Ativan -SW consulted for rehab - continue folate, thiamine, multivitamin  History of hep C. Treated per patient history. - hep C viral load pending per GI  Hypokalemia. Improved. K 3.4 this am - Kdur 31mEq x1 today - monitor on daily labs  Hyponatremia 2/2 chronic alcohol use.  Resolved - monitor on daily labs   Pancreatitis, acute. Resolving. No abd pain or n/v this morning. Eating well -DC  IVF -Diet as tolerated  -Zofran PRN  Thrombocytopenia, chronic -Monitor daily CBC -SCDs  FEN/GI: general diet PPx: SCDs  Disposition: pending GI recs and EtOH detox  Subjective:  States feels much improved this morning. He does not personally feel any significant signs of withdrawal this morning thought did have some shakes earlier. No hallucinations. He states that he had been cutting down on his alcoholic intake at home prior to admit and is now interested in cessation. No abd pain, nausea or vomiting. States had a BM.   Objective: Temp:  [98.1 F (36.7 C)-98.9 F (37.2 C)] 98.3 F (36.8 C) (04/15 0352) Pulse Rate:  [91-101] 91 (04/15 0352) Resp:  [18-20] 18 (04/15 0352) BP: (129-146)/(78-92) 139/78 (04/15 0352) SpO2:  [96 %-99 %] 98 % (04/15 0352) Weight:  [113.4 kg] 113.4 kg (04/14 1141)  General: laying comfortably in bed. Significantly jaundiced. In NAD HEENT: Significant scleral icterus Cardio: Regular rhythm, normal S1 and S2. Soft systolic murmur. No rubs Pulm: CTAB, NWOB on RA Abdomen: soft, nontender, distended, + bowel sounds Extremities: WWP, no LE edema Neuro: alert and oriented. Normal speech. No tremulousness   Laboratory: Recent Labs  Lab 01/09/19 2150 01/10/19 0718 01/11/19 0322  WBC 8.2 6.3 7.8  HGB 13.6 12.4* 13.0  HCT 38.3* 34.7* 37.3*  PLT 52* 32* 38*   INR 2.9  Recent Labs  Lab 01/09/19 2150 01/10/19 0432 01/11/19 0322  NA 130* 132* 135  K 3.0* 2.7* 3.4*  CL 92* 94* 98  CO2 23 26 27   BUN 7 6 5*  CREATININE 0.57* 0.58* 0.65  CALCIUM 8.2* 8.1* 8.6*  PROT 7.4 6.4* 6.6  BILITOT 36.1* 31.1* 32.8*  ALKPHOS 121 107 114  ALT 80* 64* 66*  AST 250* 217* 191*  GLUCOSE 143* 105* 121*    Imaging/Diagnostic Tests: US Abdomen Limited Ruq  Result Date: 01/10/2019 CLINICAL DATA:  Hyperbilirubinemia EXAM: ULTRASOUND ABDOMEN LIMITED RIGHT UPPER QUADRANT COMPARISON:  None. FINDINGS: Gallbladder: Gallbladder is partially distended with  thickened irregular wall measuring up to 11 mm. Minimal pericholecystic fluid is noted. No definitive cholelithiasis is seen. Gallbladder sludge is noted. Common bile duct: Diameter: 4.7 mm Liver: Diffuse increased echogenicity is noted consistent with fatty infiltration. No focal mass is noted. Poor flow is reversed and hepatofugal. IMPRESSION: Increased gallbladder wall thickening with mild pericholecystic fluid. This may represent acute cholecystitis in the appropriate clinical setting. HIDA scan may be helpful to assess for gallbladder function. Fatty liver. Hepatofugal portal flow. Electronically Signed   By: Inez Catalina M.D.   On: 01/10/2019 00:53    Bufford Lope, DO 01/11/2019, 8:07 AM PGY-3, [place] Intern pager: [phone], text pages welcome

## 2019-01-11 NOTE — TOC Progression Note (Signed)
Transition of Care [facility]) - Progression Note    Patient Details  Name: [name] MRN: [id number] Date of Birth: 03/01/1981  Transition of Care [facility]) CM/SW [place], [place] Phone Number: 01/11/2019, 12:28 PM  Clinical Narrative:  CSW met with patient to provide substance abuse resources. CSW discussed with the patient how he is going to kill himself if he continues to drink, and patient indicated understanding. CSW provided resources and discussed changes currently in place with facility options because of the pandemic, and highlighted places that are still accepting referrals and providing services at this time. Patient kept his eyes closed during discussion, but responded appropriately. Patient indicated appreciation for resource information.    Expected Discharge Plan: Home/Self Care Barriers to Discharge: Continued Medical Work up  Expected Discharge Plan and Services Expected Discharge Plan: Home/Self Care       Living arrangements for the past 2 months: Single Family Home(one level) Expected Discharge Date: 01/12/19                         Social Determinants of Health (SDOH) Interventions    Readmission Risk Interventions No flowsheet data found.

## 2019-01-12 DIAGNOSIS — F10239 Alcohol dependence with withdrawal, unspecified: Secondary | ICD-10-CM | POA: Diagnosis present

## 2019-01-12 DIAGNOSIS — F10231 Alcohol dependence with withdrawal delirium: Secondary | ICD-10-CM

## 2019-01-12 DIAGNOSIS — F10939 Alcohol use, unspecified with withdrawal, unspecified: Secondary | ICD-10-CM | POA: Diagnosis present

## 2019-01-12 DIAGNOSIS — K7010 Alcoholic hepatitis without ascites: Principal | ICD-10-CM

## 2019-01-12 LAB — CBC
HCT: 37.7 % — ABNORMAL LOW (ref 39.0–52.0)
Hemoglobin: 13.1 g/dL (ref 13.0–17.0)
MCH: 33.1 pg (ref 26.0–34.0)
MCHC: 34.7 g/dL (ref 30.0–36.0)
MCV: 95.2 fL (ref 80.0–100.0)
Platelets: 57 10*3/uL — ABNORMAL LOW (ref 150–400)
RBC: 3.96 MIL/uL — ABNORMAL LOW (ref 4.22–5.81)
RDW: 19.7 % — ABNORMAL HIGH (ref 11.5–15.5)
WBC: 10.3 10*3/uL (ref 4.0–10.5)
nRBC: 0.2 % (ref 0.0–0.2)

## 2019-01-12 LAB — COMPREHENSIVE METABOLIC PANEL
ALT: 66 U/L — ABNORMAL HIGH (ref 0–44)
AST: 166 U/L — ABNORMAL HIGH (ref 15–41)
Albumin: 2.1 g/dL — ABNORMAL LOW (ref 3.5–5.0)
Alkaline Phosphatase: 115 U/L (ref 38–126)
Anion gap: 13 (ref 5–15)
BUN: 9 mg/dL (ref 6–20)
CO2: 25 mmol/L (ref 22–32)
Calcium: 8.9 mg/dL (ref 8.9–10.3)
Chloride: 99 mmol/L (ref 98–111)
Creatinine, Ser: 0.76 mg/dL (ref 0.61–1.24)
GFR calc Af Amer: >60 mL/min (ref >60)
GFR calc non Af Amer: >60 mL/min (ref >60)
Glucose, Bld: 86 mg/dL (ref 70–99)
Potassium: 3.2 mmol/L — ABNORMAL LOW (ref 3.5–5.1)
Sodium: 137 mmol/L (ref 135–145)
Total Bilirubin: 34.8 mg/dL (ref 0.3–1.2)
Total Protein: 6.6 g/dL (ref 6.5–8.1)

## 2019-01-12 LAB — PROTIME-INR
INR: 2.8 — ABNORMAL HIGH (ref 0.8–1.2)
Prothrombin Time: 29.2 seconds — ABNORMAL HIGH (ref 11.4–15.2)

## 2019-01-12 LAB — POCT I-STAT 7, (LYTES, BLD GAS, ICA,H+H)
Acid-Base Excess: 1 mmol/L (ref 0.0–2.0)
Bicarbonate: 24.5 mmol/L (ref 20.0–28.0)
Calcium, Ion: 1.12 mmol/L — ABNORMAL LOW (ref 1.15–1.40)
HCT: 36 % — ABNORMAL LOW (ref 39.0–52.0)
Hemoglobin: 12.2 g/dL — ABNORMAL LOW (ref 13.0–17.0)
O2 Saturation: 98 %
Patient temperature: 98.6
Potassium: 3.7 mmol/L (ref 3.5–5.1)
Sodium: 140 mmol/L (ref 135–145)
TCO2: 25 mmol/L (ref 22–32)
pCO2 arterial: 33.5 mmHg (ref 32.0–48.0)
pH, Arterial: 7.472 — ABNORMAL HIGH (ref 7.350–7.450)
pO2, Arterial: 92 mmHg (ref 83.0–108.0)

## 2019-01-12 LAB — HCV RT-PCR, QUANT (NON-GRAPH): Hepatitis C Quantitation: NOT DETECTED IU/mL

## 2019-01-12 LAB — HCV AB W REFLEX TO QUANT PCR: HCV Ab: >11 s/co ratio — ABNORMAL HIGH (ref 0.0–0.9)

## 2019-01-12 LAB — MAGNESIUM: Magnesium: 1.9 mg/dL (ref 1.7–2.4)

## 2019-01-12 MED ORDER — ALBUMIN HUMAN 25 % IV SOLN
25.0000 g | Freq: Once | INTRAVENOUS | Status: AC
  Administered 2019-01-12: 14:00:00 25 g via INTRAVENOUS
  Filled 2019-01-12: qty 50

## 2019-01-12 MED ORDER — POTASSIUM CHLORIDE CRYS ER 20 MEQ PO TBCR: 40.0000 meq | EXTENDED_RELEASE_TABLET | Freq: Two times a day (BID) | ORAL | Status: DC

## 2019-01-12 MED ORDER — LORAZEPAM 2 MG/ML IJ SOLN
1.0000 mg | INTRAMUSCULAR | Status: DC | PRN
  Administered 2019-01-12: 11:00:00 2 mg via INTRAVENOUS
  Filled 2019-01-12: qty 1

## 2019-01-12 MED ORDER — DEXMEDETOMIDINE HCL IN NACL 200 MCG/50ML IV SOLN
0.2000 ug/kg/h | INTRAVENOUS | Status: DC
  Administered 2019-01-12 (×2): 0.7 ug/kg/h via INTRAVENOUS
  Filled 2019-01-12 (×3): qty 50

## 2019-01-12 MED ORDER — HEPARIN SODIUM (PORCINE) 5000 UNIT/ML IJ SOLN
5000.0000 [IU] | Freq: Three times a day (TID) | INTRAMUSCULAR | Status: DC
  Administered 2019-01-12 – 2019-01-13 (×3): 5000 [IU] via SUBCUTANEOUS
  Filled 2019-01-12 (×2): qty 1

## 2019-01-12 MED ORDER — SODIUM CHLORIDE 0.9 % IV BOLUS
1000.0000 mL | Freq: Once | INTRAVENOUS | Status: AC
  Administered 2019-01-12: 13:00:00 1000 mL via INTRAVENOUS

## 2019-01-12 MED ORDER — ALBUMIN HUMAN 5 % IV SOLN
INTRAVENOUS | Status: AC
  Filled 2019-01-12: qty 250

## 2019-01-12 MED ORDER — LORAZEPAM 2 MG/ML IJ SOLN
2.0000 mg | Freq: Once | INTRAMUSCULAR | Status: AC
  Administered 2019-01-12: 08:00:00 2 mg via INTRAVENOUS
  Filled 2019-01-12: qty 1

## 2019-01-12 MED ORDER — POTASSIUM CHLORIDE 10 MEQ/100ML IV SOLN
10.0000 meq | INTRAVENOUS | Status: AC
  Administered 2019-01-12 (×6): 10 meq via INTRAVENOUS
  Filled 2019-01-12 (×6): qty 100

## 2019-01-12 MED ORDER — DEXTROSE-NACL 5-0.9 % IV SOLN
INTRAVENOUS | Status: DC
  Administered 2019-01-12 – 2019-01-14 (×9): via INTRAVENOUS

## 2019-01-12 MED ORDER — PANTOPRAZOLE SODIUM 40 MG IV SOLR
40.0000 mg | Freq: Every day | INTRAVENOUS | Status: DC
  Administered 2019-01-12 – 2019-01-14 (×3): 40 mg via INTRAVENOUS
  Filled 2019-01-12 (×3): qty 40

## 2019-01-12 NOTE — Progress Notes (Signed)
Patient ID: [name], male   DOB: [birth year], 38 y.o.   MRN: [id number]    Progress Note   Subjective  Day #3 hospital stay  Pt transferred to stepdown this am due to increasing agitation /withdrawal - starting precedex No c/o abdominal  pain, no N/V  Objective   Vital signs in last 24 hours: Temp:  [97.9 F (36.6 C)-98.5 F (36.9 C)] 98.4 F (36.9 C) (04/16 0744) Pulse Rate:  [96-133] 133 (04/16 0744) Resp:  [18-20] 20 (04/16 0744) BP: (127-144)/(67-90) 127/67 (04/16 0744) SpO2:  [95 %-100 %] 96 % (04/16 0744) Last BM Date: 01/10/19 General:    white male acutely ill, very jaundiced - confused and somewhat agitated Heart: tachy Regular rate and rhythm; no murmurs Lungs: Respirations even and unlabored, lungs CTA bilaterally Abdomen:  Soft,large , nontender, nondistended Bs+ Extremities:  Without edema. Neurologic:  Alert , oriented to person only- confused , hallucinating Psych:  Cooperative at present  Intake/Output from previous day: 04/15 0701 - 04/16 0700 In: 580 [P.O.:580] Out: 1450 [Urine:1450] Intake/Output this shift: No intake/output data recorded.  Lab Results: Recent Labs    01/10/19 0718 01/11/19 0322 01/12/19 0529  WBC 6.3 7.8 10.3  HGB 12.4* 13.0 13.1  HCT 34.7* 37.3* 37.7*  PLT 32* 38* 57*   BMET Recent Labs    01/10/19 0432 01/11/19 0322 01/12/19 0529  NA 132* 135 137  K 2.7* 3.4* 3.2*  CL 94* 98 99  CO2 26 27 25   GLUCOSE 105* 121* 86  BUN 6 5* 9  CREATININE 0.58* 0.65 0.76  CALCIUM 8.1* 8.6* 8.9   LFT Recent Labs    01/12/19 0529  PROT 6.6  ALBUMIN 2.1*  AST 166*  ALT 66*  ALKPHOS 115  BILITOT 34.8*   PT/INR Recent Labs    01/11/19 0322 01/12/19 0529  LABPROT 30.2* 29.2*  INR 2.9* 2.8*      Assessment / Plan:     #1 38 yo WM with severe ETOH hepatitis  With initial DF score of 86 INR  And Tbili stable today ,though no improvement as yet  Continue prednisolone 40 mg daily Supportive management   On   Im Vit K daily x 5  #2 ETOH withdrawal - active Dt's- no on CCM service ,initiating Precedex #3 Hx Hep C treated - Hep C Pcr RNA quant - this admit- no HCV detected #4 hypokalemia- correcting  #5 Cirrhosis - MELD =33- #6 elevated  Lipase on admit - no clinical evidence for Pancreatitis  Will follow            Active Problems:   Alcohol withdrawal syndrome with complication (HCC)   Alcoholic hepatitis   Alcohol withdrawal (HCC)     LOS: 2 days   [name]  01/12/2019, 10:42 AM

## 2019-01-12 NOTE — Progress Notes (Signed)
FPTS Interim Note:   RN called with CIWA 25. Pt has been refusing PO librium. Last ativan was at 2am. Ordered for 2mg  ativan now, and I have paged CCM for precedex infusion, pt has required this in the past. Will update RN if pt requires transfer for precedex.   Ralene Ok, MD

## 2019-01-12 NOTE — Progress Notes (Signed)
Patient's blood pressure was decreasing. Called Dr. Ander Slade and he ordered a Normal saline bolus. Patient's blood pressure continued to fall so called. Dr. Ander Slade to come see patient. Patient is sleepy but still talking. [name] is not purposeful but will say stop to painful stimulus. Dr. Ander Slade ordered ABG and albumin to be given and increased maintenance fluids. Will continue to monitor.

## 2019-01-12 NOTE — Discharge Summary (Signed)
[facility] Discharge Summary  Patient name: Myrle Dues Medical record number: [id number] Date of birth: 1980-11-25 Age: 38 y.o. Gender: male Date of Admission: 01/09/2019  Date of Discharge: 01/17/2019 Admitting Physician: Dickie La, MD  Primary Care Provider: Nicholes Rough, PA-C Consultants: GI, palliative care  Indication for Hospitalization: acute on chronic liver failure  Discharge Diagnoses/Problem List:  [place] alcoholic liver failure Alcohol withdrawal with DTs Hx of hep C thrombocytopenia  Disposition: home with brother and HHPT  Discharge Condition: guarded  Discharge Exam: see progress note from day of discharge  Brief Hospital Course:  Pt presented with acute on chronic alcoholic hepatitis. Hx of treated hep C.  On admission his bilirubin was 36.  He had drunk on the day of admission.  He also had continuation of his chronic thrombocytopenia on admission.  GI was consulted for management of acute on chronic liver failure and prednisone as well as vitamin K x3 days.  GI also started lactulose, octreotide, and albumin and rifaximin.  GI team discussed his case with [facility] transplant hepatology who stated he would not be a transplant candidate given ongoing alcohol use.  For his alcohol use he became violent and disoriented and required Precedex x24 hours and a Librium over 3 to 4 days.  Palliative care was consulted given his overall poor prognosis and the patient was hesitant to accept outpatient hospice will continue with palliative care.  Several discussions were had with both father and mom regarding overall prognosis questions asked and answered.  At the time of discharge he was oriented and understanding of his medical course.  Issues for Follow Up:  1. Needs outpatient GI follow up - has hepatologist at [facility]. GI recommended prednisone 40 mg p.o. daily x21 days Continue lactulose 30 cc p.o. twice daily, Continue Protonix 40 mg p.o.  every morning. 2. Palliative care - needs continued outpatient palliative care discussions, certainly a hospice candidate but patient is not ready to express this.  3. Recheck CBC for thrombocytopenia.  4. Continue to encourage alcohol abstinence.   Significant Procedures: none  Significant Labs and Imaging:  Recent Labs  Lab 01/19/19 0536 01/20/19 1014 01/21/19 0729  WBC 9.9 11.5* 13.8*  HGB 13.1 13.6 13.8  HCT 38.1* 40.8 40.9  PLT 46* 40* 47*   Recent Labs  Lab 01/18/19 1746 01/19/19 0006 01/19/19 0536 01/20/19 1014 01/21/19 0729  NA 139 138 139 141 140  K 3.3* 3.2* 3.3* 3.7 4.6  CL 102 104 104 109 107  CO2 27 26 23 23 24   GLUCOSE 120* 111* 98 132* 99  BUN 16 15 15 16 20   CREATININE 0.88 0.83 0.84 1.08 1.24  CALCIUM 9.0 8.6* 8.5* 8.5* 8.7*  MG  --   --  1.9  --  2.4  ALKPHOS 86 89 90 85 92  AST 142* 129* 133* 125* 129*  ALT 99* 93* 92* 96* 102*  ALBUMIN 2.1* 1.9* 1.8* 1.7* 1.8*     Results/Tests Pending at Time of Discharge: none  Discharge Medications:  Allergies as of 01/17/2019      Reactions   Acetaminophen Other (See Comments)   Due to liver issues      Medication List    STOP taking these medications   chlordiazePOXIDE 25 MG capsule Commonly known as:  LIBRIUM   methocarbamol 500 MG tablet Commonly known as:  ROBAXIN   ondansetron 4 MG disintegrating tablet Commonly known as:  Zofran ODT   oxyCODONE 5 MG immediate  release tablet Commonly known as:  Oxy IR/ROXICODONE   potassium chloride SA 20 MEQ tablet Commonly known as:  K-DUR   promethazine 25 MG tablet Commonly known as:  PHENERGAN   protein supplement shake Liqd Commonly known as:  PREMIER PROTEIN     TAKE these medications   feeding supplement (ENSURE ENLIVE) Liqd Take 237 mLs by mouth 2 (two) times daily between meals.   multivitamin with minerals Tabs tablet Take 1 tablet by mouth daily.   pantoprazole 40 MG tablet Commonly known as:  PROTONIX Take 1 tablet (40 mg  total) by mouth every morning.   rifaximin 550 MG Tabs tablet Commonly known as:  XIFAXAN Take 1 tablet (550 mg total) by mouth 2 (two) times daily for 20 days.   tetrahydrozoline-zinc 0.05-0.25 % ophthalmic solution Commonly known as:  VISINE-AC Place 2 drops into both eyes daily as needed (itchy eyes).       Discharge Instructions: Please refer to Patient Instructions section of EMR for full details.  Patient was counseled important signs and symptoms that should prompt return to medical care, changes in medications, dietary instructions, activity restrictions, and follow up appointments.   Follow-Up Appointments: Follow-up Information    Pleasant, Zettie Cooley, PA Follow up.   Specialty:  Physician Assistant Why:  at [facility] in [place], follow up with provider for liver disease.  make appoinment to be seen in next 4 to 6 weeks.   Contact information: [address] [place] [address] [phone]        Nicholes Rough, PA-C. Schedule an appointment as soon as possible for a visit.   Specialty:  Physician Assistant Contact information: [place] [address] [phone]           Sela Hilding, MD 01/23/2019, 11:02 AM PGY-3, [place]

## 2019-01-12 NOTE — Consult Note (Signed)
NAME:  [name], MRN:  [id number], DOB:  1981/04/12, LOS: 2 ADMISSION DATE:  01/09/2019, CONSULTATION DATE:  01/12/2019 REFERRING MD:  Dr Madison Hickman, CHIEF COMPLAINT:  Alcohol withdrawal   Brief History   38 year old gentleman who was admitted with jaundice Background history of hepatitis C, alcoholism, liver cirrhosis, alcohol induced pancreatitis Been treated for alcohol withdrawal Drinks approximately 1 pint of liquor daily  History of present illness   Recent emesis and diarrhea Symptoms started 4/8-emesis, diarrhea that lasted about 2 days  Associated abdominal discomfort and constipation Dark urine Last drink was 4/13-couple of shots  Past Medical History   Past Medical History:  Diagnosis Date  . Alcoholic cirrhosis ([place])   . Cellulitis   . Cellulitis 10/2016   right leg  . Elevated liver function tests 10/2016  . 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 ETOH abuse   . Hepatitis C    Hepatitis C  . Paraspinal abscess ([place])   . Thrombocytopenia ([place]) 25/2018    Significant Hospital Events   Continue tachycardia Agitation  Consults:  pccm 01/12/2019 GI-01/10/2019  Procedures:  none  Significant Diagnostic Tests:  Abdominal ultrasound on 01/10/2019 IMPRESSION: Increased gallbladder wall thickening with mild pericholecystic fluid. This may represent acute cholecystitis in the appropriate clinical setting. HIDA scan may be helpful to assess for gallbladder function.  Fatty liver.  Hepatofugal portal flow. Micro Data:  None  Antimicrobials:  None  Interim history/subjective:  Continuing agitation, restlessness, tachycardia  Objective   Blood pressure 127/67, pulse (!) 133, temperature 98.4 F (36.9 C), temperature source Oral, resp. rate 20, height 5\' 11"  (1.803 m), weight 113.4 kg, SpO2 96 %.        Intake/Output Summary (Last 24 hours) at 01/12/2019 0837 Last data filed at 01/12/2019 0530 Gross per 24 hour  Intake 580 ml   Output 1450 ml  Net -870 ml   Filed Weights   01/09/19 2146 01/10/19 1141  Weight: 113.4 kg 113.4 kg    Examination: General: Young gentleman, jaundiced HENT: Moist oral mucosa Lungs: Clear breath sounds Cardiovascular: S1-S2 appreciated Abdomen: Bowel sounds appreciated, soft, nontender Extremities: No edema, no clubbing Neuro: Alert, follows commands, confused GU: [place] [facility] Problem list     Assessment & Plan:   Acute alcoholic hepatitis -He is on prednisone -GI following  Liver cirrhosis Risk of worsening liver failure -Meld score elevated with poor prognosis -Ammonia level of 32  -Continue lactulose  Alcohol withdrawal -Elevated CIWA -Continues to be very agitated -Noted to be sometimes combative -Persistent tachycardia -We will start him on Precedex -Transfer to ICU  History of hepatitis C -Treated -Status post moderate therapy -Viral load pending per GI  Hypokalemia -Repleted -Continue to monitor and correct  Pancreatitis -Zofran PRN -Does not appear to have any abdominal pain or discomfort -With agitation and requirement for escalation to Precedex, place him n.p.o. temporarily, This can be changed if he is no longer agitated  Thrombocytopenia -Continue to monitor closely -Trend CBC -Likely related to his alcoholism  He is at risk of significant decompensation   Best practice:  Diet: As tolerated, monitor closely may need to be n.p.o. with sedation Pain/Anxiety/Delirium protocol (if indicated): Precedex/Ativan VAP protocol (if indicated): Not indicated DVT prophylaxis: Heparin/SCD GI prophylaxis: Protonix Glucose control:  Mobility: Bedrest Code Status: Full code Family Communication:  Disposition: Transfer to ICU for Precedex infusion  Labs   CBC: Recent Labs  Lab 01/09/19 2150 01/10/19 0718 01/11/19 0322 01/12/19 0529  WBC 8.2  6.3 7.8 10.3  NEUTROABS 5.0  --   --   --   HGB 13.6 12.4* 13.0 13.1  HCT  38.3* 34.7* 37.3* 37.7*  MCV 93.9 92.0 94.7 95.2  PLT 52* 32* 38* 57*    Basic Metabolic Panel: Recent Labs  Lab 01/09/19 2150 01/10/19 0432 01/10/19 1233 01/11/19 0322 01/12/19 0529  NA 130* 132*  --  135 137  K 3.0* 2.7*  --  3.4* 3.2*  CL 92* 94*  --  98 99  CO2 23 26  --  27 25  GLUCOSE 143* 105*  --  121* 86  BUN 7 6  --  5* 9  CREATININE 0.57* 0.58*  --  0.65 0.76  CALCIUM 8.2* 8.1*  --  8.6* 8.9  MG  --   --  1.9  --   --    GFR: Estimated Creatinine Clearance: 160.3 mL/min (by C-G formula based on SCr of 0.76 mg/dL). Recent Labs  Lab 01/09/19 2150 01/10/19 0718 01/11/19 0322 01/12/19 0529  WBC 8.2 6.3 7.8 10.3    Liver Function Tests: Recent Labs  Lab 01/09/19 2150 01/10/19 0432 01/11/19 0322 01/12/19 0529  AST 250* 217* 191* 166*  ALT 80* 64* 66* 66*  ALKPHOS 121 107 114 115  BILITOT 36.1* 31.1* 32.8* 34.8*  PROT 7.4 6.4* 6.6 6.6  ALBUMIN 2.4* 2.1* 2.0* 2.1*   Recent Labs  Lab 01/09/19 2150  LIPASE 786*   Recent Labs  Lab 01/10/19 0039 01/11/19 0322  AMMONIA 56* 32    ABG    Component Value Date/Time   PHART 7.430 11/04/2016 1028   PCO2ART 36.7 11/04/2016 1028   PO2ART 60.0 (L) 11/04/2016 1028   HCO3 24.0 11/04/2016 1028   TCO2 25 11/01/2016 1212   ACIDBASEDEF 2.0 11/01/2016 1212   O2SAT 89.6 11/04/2016 1028     Coagulation Profile: Recent Labs  Lab 01/09/19 2150 01/10/19 0432 01/11/19 0322 01/12/19 0529  INR 2.4* 2.7* 2.9* 2.8*    Cardiac Enzymes: No results for input(s): CKTOTAL, CKMB, CKMBINDEX, TROPONINI in the last 168 hours.  HbA1C: No results found for: HGBA1C  CBG: No results for input(s): GLUCAP in the last 168 hours.  Review of Systems:   Review of Systems  Constitutional: Negative for chills and fever.  HENT: Negative.   Respiratory: Negative.   Cardiovascular: Negative.   Skin: Negative.        Jaundice     Past Medical History  He,  has a past medical history of Alcoholic cirrhosis ([place]),  Cellulitis, Cellulitis (10/2016), Elevated liver function tests (10/2016), Epidural intraspinal abscess (10/2016), ETOH abuse, Hepatitis C, Paraspinal abscess ([place]), and Thrombocytopenia ([place]) (25/2018).   Surgical History    Past Surgical History:  Procedure Laterality Date  . DECOMPRESSIVE LUMBAR LAMINECTOMY LEVEL 1 N/A 11/01/2016   Procedure: DECOMPRESSIVE POSTERIOR LUMBAR LAMINECTOMY LEVEL 4-5;  Surgeon: Melina Schools, MD;  Location: [facility];  Service: Orthopedics;  Laterality: N/A;  . HERNIA REPAIR Left    INGUNIAL  . I&D EXTREMITY Right 11/01/2016   Procedure: IRRIGATION AND DEBRIDEMENT ABSCESS RIGHT THIGH;  Surgeon: Melina Schools, MD;  Location: [place];  Service: Orthopedics;  Laterality: Right;  . INCISION AND DRAINAGE OF WOUND N/A 01/06/2017   Procedure: IRRIGATION AND DEBRIDEMENT and placement of wound vac;  Surgeon: Melina Schools, MD;  Location: [place] [facility];  Service: Orthopedics;  Laterality: N/A;  . INCISION AND DRAINAGE OF WOUND N/A 12/02/2016   Procedure: WASHOUT AND CLOSURE OF WOUND;  Surgeon: Melina Schools, MD;  Location: [place] [facility];  Service: Orthopedics;  Laterality: N/A;  . LUMBAR WOUND DEBRIDEMENT N/A 11/12/2016   Procedure: LUMBAR WOUND DEBRIDEMENT;  Surgeon: Melina Schools, MD;  Location: [place];  Service: Orthopedics;  Laterality: N/A;     Social History   reports that he quit smoking about 2 years ago. He has never used smokeless tobacco. He reports current alcohol use. He reports that he does not use drugs.   Family History   His family history includes Diabetes in his mother.   Allergies Allergies  Allergen Reactions  . Acetaminophen Other (See Comments)    Due to liver issues     Critical care time: 30 minutes of critical care time spent evaluating patient, reviewing records, formulating plan of care.

## 2019-01-12 NOTE — Progress Notes (Signed)
MD notified of CIWA score of 25, orders received.Arlis Porta

## 2019-01-12 NOTE — Plan of Care (Signed)

## 2019-01-12 NOTE — Progress Notes (Signed)
Family Medicine Teaching Service Daily Progress Note Intern Pager: [phone]  Patient name: [name] Medical record number: [id number] Date of birth: [birth year] Age: 38 y.o. Gender: male  Primary Care Provider: Nicholes Rough, PA-C Consultants: GI Code Status: Full  Pt Overview and Major Events to Date:  4/14 - admit for acute hepatitis 4/16 - CIWA 25, pt refusing librium, PCCM consulted for precedex  Assessment and Plan: [name] is a 38 y.o. male presented with jaundice 2/2 acute on chronic alcoholic hepatitis. PMH is significant for hepatitis C s/p Mavyret therapy, alcoholism, liver cirrhosis, alcoholic induced pancreatitis, and thrombocytopenia.   Acute alcoholic hepatitis. MELD score 33 with poor prognosis and profound cholestasis and jaundice. GI recs state that presentation is inconsistent with acute cholecystitis but could have element of pancreatitis. INR stable this am after 10mg  vit K x 2.  -continue prednisolone (4/14-) -per GI continue steroids and monitor response, will likely need at least 1 month and will need coordination with outpt hepatologist for close follow up. Monitor LFTs and INR closely. No need for HIDA.  -Vitamin K (4/14-4/17) per GI -Zofran PRN -Daily CMP, PT/INR -continue lactulose qd  Alcohol withdrawal Last drink 01/09/19 and patient states today that he is interested in cessation. He has required precedex in the past making him high risk for withdrawal. CIWAs overnight -attempted librium taper, pt disoriented and refusing PO -CCM consulted for precedex -monitor on CIWA with prn Ativan -SW consulted for rehab, resources offered - continue folate, thiamine, multivitamin  History of hep C. Treated per patient history. - hep C viral load pending per GI  Hypokalemia. Improved. K 3.2 this am - Kdur 90mEq x1 today -add on Mg to am labs - monitor on daily labs  Hyponatremia 2/2 chronic alcohol use.  Resolved - monitor on daily labs    Pancreatitis, acute. Resolving. No abd pain or n/v this morning. Eating well -DC IVF -Diet as tolerated  -Zofran PRN  Thrombocytopenia, chronic -Monitor daily CBC -SCDs  FEN/GI: general diet PPx: SCDs  Disposition: Transfer to ICU for Precedex due to alcohol withdrawal  Subjective:  Patient was oriented to person, city and state this morning.  Speaking in garbled speech about nonsensical topics.  In four-point restraints.   Objective: Temp:  [97.9 F (36.6 C)-98.5 F (36.9 C)] 98.4 F (36.9 C) (04/16 0744) Pulse Rate:  [65-133] 133 (04/16 0744) Resp:  [18-20] 20 (04/16 0744) BP: (120-144)/(67-90) 127/67 (04/16 0744) SpO2:  [95 %-100 %] 96 % (04/16 0744)  General: Restrained in four-point restraints. Significantly jaundiced with bilateral scleral icterus.  At the time of my exam, noncombative HEENT: Significant scleral icterus Cardio: Regular rhythm, normal S1 and S2. Soft systolic murmur. No rubs Pulm: CTAB, NWOB on RA Abdomen: soft, nontender, distended, + bowel sounds Extremities: WWP, no LE edema Neuro: alert and oriented. Normal speech. No tremulousness   Laboratory: Recent Labs  Lab 01/10/19 0718 01/11/19 0322 01/12/19 0529  WBC 6.3 7.8 10.3  HGB 12.4* 13.0 13.1  HCT 34.7* 37.3* 37.7*  PLT 32* 38* 57*   INR 2.9  Recent Labs  Lab 01/10/19 0432 01/11/19 0322 01/12/19 0529  NA 132* 135 137  K 2.7* 3.4* 3.2*  CL 94* 98 99  CO2 26 27 25   BUN 6 5* 9  CREATININE 0.58* 0.65 0.76  CALCIUM 8.1* 8.6* 8.9  PROT 6.4* 6.6 6.6  BILITOT 31.1* 32.8* 34.8*  ALKPHOS 107 114 115  ALT 64* 66* 66*  AST 217* 191* 166*  GLUCOSE  105* 121* 86    Imaging/Diagnostic Tests: No results found.  Sela Hilding, MD 01/12/2019, 8:06 AM PGY-3, [facility] Intern pager: [phone], text pages welcome

## 2019-01-12 NOTE — Progress Notes (Signed)
Patient was very confused, anxious, agitated, and combative, refused to take po schedule med, attempting to leave room, sitter was not able to handle him, nurse attempted to reassure, reorient, and redirect; but got unsuccessful, administered 4mg  IV ativan, did not help, paged oncall  Physician, received order for bi-lateral wrists and ankles soft restraints., now pt is on bi-lateral soft wrists and ankles restraints, will  Continued routine safety care as facility protocol, and monitor closely, sitter is in bed side.

## 2019-01-12 NOTE — Progress Notes (Signed)
Patient arrived to the floor. Safety attendant with patient. Started Precedex infusion per orders. Gave Ativan per CIWA scale. CHG bath given. Placed Sacrum and bilateral foam heel dressings for preventative only. Refused all oral medications. Informed Pharmacy to switch all medications over to IV if able. Michael in Pharmacy verbalized understanding. Patient currently resting in restraints in the bed, lowest position, vital signs stable will continue to monitor.

## 2019-01-13 DIAGNOSIS — N179 Acute kidney failure, unspecified: Secondary | ICD-10-CM

## 2019-01-13 LAB — CREATININE, URINE, RANDOM: Creatinine, Urine: 62.09 mg/dL

## 2019-01-13 LAB — SODIUM, URINE, RANDOM: Sodium, Ur: 26 mmol/L

## 2019-01-13 LAB — COMPREHENSIVE METABOLIC PANEL
ALT: 67 U/L — ABNORMAL HIGH (ref 0–44)
ALT: 68 U/L — ABNORMAL HIGH (ref 0–44)
AST: 183 U/L — ABNORMAL HIGH (ref 15–41)
AST: 196 U/L — ABNORMAL HIGH (ref 15–41)
Albumin: 1.9 g/dL — ABNORMAL LOW (ref 3.5–5.0)
Albumin: 1.9 g/dL — ABNORMAL LOW (ref 3.5–5.0)
Alkaline Phosphatase: 84 U/L (ref 38–126)
Alkaline Phosphatase: 99 U/L (ref 38–126)
Anion gap: 8 (ref 5–15)
Anion gap: 14 (ref 5–15)
BUN: 24 mg/dL — ABNORMAL HIGH (ref 6–20)
BUN: 25 mg/dL — ABNORMAL HIGH (ref 6–20)
CO2: 25 mmol/L (ref 22–32)
CO2: 20 mmol/L — ABNORMAL LOW (ref 22–32)
Calcium: 8 mg/dL — ABNORMAL LOW (ref 8.9–10.3)
Calcium: 8 mg/dL — ABNORMAL LOW (ref 8.9–10.3)
Chloride: 106 mmol/L (ref 98–111)
Chloride: 110 mmol/L (ref 98–111)
Creatinine, Ser: 2.45 mg/dL — ABNORMAL HIGH (ref 0.61–1.24)
Creatinine, Ser: 2.22 mg/dL — ABNORMAL HIGH (ref 0.61–1.24)
GFR calc Af Amer: 37 mL/min — ABNORMAL LOW (ref >60)
GFR calc Af Amer: 42 mL/min — ABNORMAL LOW (ref >60)
GFR calc non Af Amer: 32 mL/min — ABNORMAL LOW (ref >60)
GFR calc non Af Amer: 36 mL/min — ABNORMAL LOW (ref >60)
Glucose, Bld: 106 mg/dL — ABNORMAL HIGH (ref 70–99)
Glucose, Bld: 142 mg/dL — ABNORMAL HIGH (ref 70–99)
Potassium: 3.7 mmol/L (ref 3.5–5.1)
Potassium: 3.7 mmol/L (ref 3.5–5.1)
Sodium: 139 mmol/L (ref 135–145)
Sodium: 144 mmol/L (ref 135–145)
Total Bilirubin: 33.9 mg/dL (ref 0.3–1.2)
Total Bilirubin: 33.6 mg/dL (ref 0.3–1.2)
Total Protein: 5.8 g/dL — ABNORMAL LOW (ref 6.5–8.1)
Total Protein: 6.2 g/dL — ABNORMAL LOW (ref 6.5–8.1)

## 2019-01-13 LAB — PROTIME-INR
INR: 3 — ABNORMAL HIGH (ref 0.8–1.2)
INR: 2.7 — ABNORMAL HIGH (ref 0.8–1.2)
Prothrombin Time: 30.3 seconds — ABNORMAL HIGH (ref 11.4–15.2)
Prothrombin Time: 28.4 seconds — ABNORMAL HIGH (ref 11.4–15.2)

## 2019-01-13 LAB — CBC
HCT: 34.7 % — ABNORMAL LOW (ref 39.0–52.0)
Hemoglobin: 11.7 g/dL — ABNORMAL LOW (ref 13.0–17.0)
MCH: 33.4 pg (ref 26.0–34.0)
MCHC: 33.7 g/dL (ref 30.0–36.0)
MCV: 99.1 fL (ref 80.0–100.0)
Platelets: 53 10*3/uL — ABNORMAL LOW (ref 150–400)
RBC: 3.5 MIL/uL — ABNORMAL LOW (ref 4.22–5.81)
RDW: 21.1 % — ABNORMAL HIGH (ref 11.5–15.5)
WBC: 8.9 10*3/uL (ref 4.0–10.5)
nRBC: 0.2 % (ref 0.0–0.2)

## 2019-01-13 LAB — PHOSPHORUS: Phosphorus: 6.8 mg/dL — ABNORMAL HIGH (ref 2.5–4.6)

## 2019-01-13 LAB — MRSA PCR SCREENING: MRSA by PCR: NEGATIVE

## 2019-01-13 LAB — AMMONIA: Ammonia: 87 umol/L — ABNORMAL HIGH (ref 9–35)

## 2019-01-13 LAB — MAGNESIUM: Magnesium: 1.8 mg/dL (ref 1.7–2.4)

## 2019-01-13 MED ORDER — SODIUM CHLORIDE 0.9 % IV BOLUS
1000.0000 mL | Freq: Once | INTRAVENOUS | Status: AC
  Administered 2019-01-13: 11:00:00 1000 mL via INTRAVENOUS

## 2019-01-13 MED ORDER — OCTREOTIDE ACETATE 100 MCG/ML IJ SOLN
200.0000 ug | Freq: Three times a day (TID) | INTRAMUSCULAR | Status: DC
  Administered 2019-01-13 – 2019-01-17 (×14): 200 ug via SUBCUTANEOUS
  Filled 2019-01-13 (×17): qty 2

## 2019-01-13 MED ORDER — LORAZEPAM 2 MG/ML IJ SOLN
0.5000 mg | INTRAMUSCULAR | Status: DC | PRN
  Administered 2019-01-14 – 2019-01-16 (×2): 0.5 mg via INTRAVENOUS
  Filled 2019-01-13 (×3): qty 1

## 2019-01-13 MED ORDER — RIFAXIMIN 550 MG PO TABS
550.0000 mg | ORAL_TABLET | Freq: Two times a day (BID) | ORAL | Status: DC
  Administered 2019-01-13 – 2019-01-17 (×8): 550 mg via ORAL
  Filled 2019-01-13 (×9): qty 1

## 2019-01-13 MED ORDER — MAGNESIUM SULFATE 2 GM/50ML IV SOLN
2.0000 g | Freq: Once | INTRAVENOUS | Status: AC
  Administered 2019-01-13: 11:00:00 2 g via INTRAVENOUS
  Filled 2019-01-13: qty 50

## 2019-01-13 MED ORDER — ALBUMIN HUMAN 25 % IV SOLN
50.0000 g | Freq: Once | INTRAVENOUS | Status: AC
  Administered 2019-01-13: 12:00:00 50 g via INTRAVENOUS
  Filled 2019-01-13: qty 50

## 2019-01-13 MED ORDER — LACTULOSE 10 GM/15ML PO SOLN
20.0000 g | Freq: Three times a day (TID) | ORAL | Status: DC
  Administered 2019-01-13 – 2019-01-15 (×6): 20 g via ORAL
  Filled 2019-01-13 (×6): qty 30

## 2019-01-13 NOTE — Progress Notes (Signed)
Patient ID: [name], male   DOB: [birth year], 38 y.o.   MRN: [id number]     Progress Note   Subjective     Pt lethargic but arousable and oriented x 2 -last dose Ativan at 4 AM.  No complaint of pain or shortness of breath Hypotensive earlier this a.m.-Precedex discontinued  INR 3.0 T bili 33.9 Creatinine now 2.45     Objective   Vital signs in last 24 hours: Temp:  [98.5 F (36.9 C)-99.7 F (37.6 C)] 98.5 F (36.9 C) (04/17 0758) Pulse Rate:  [46-105] 99 (04/17 0900) Resp:  [26-48] 35 (04/17 0900) BP: (71-113)/(42-75) 110/70 (04/17 0900) SpO2:  [91 %-100 %] 100 % (04/17 0900) Weight:  [532 kg] 128 kg (04/17 0500) Last BM Date: 01/11/19 General:     Ill appearing WM, deeply jaundiced in NAD Heart:  Regular rate and rhythm; no murmurs Lungs: Respirations even and unlabored, lungs CTA bilaterally Abdomen:  Soft, obese nontender and nondistended. Normal bowel sounds. Extremities:  Without edema. Neurologic: Lethargic but arouses and able to answer simple questions Psych:  Cooperative.   Intake/Output from previous day: 04/16 0701 - 04/17 0700 In: 3535.4 [I.V.:1935.2; IV Piggyback:1600.2] Out: 450 [Urine:450] Intake/Output this shift: No intake/output data recorded.  Lab Results: Recent Labs    01/11/19 0322 01/12/19 0529 01/12/19 1408 01/13/19 0403  WBC 7.8 10.3  --  8.9  HGB 13.0 13.1 12.2* 11.7*  HCT 37.3* 37.7* 36.0* 34.7*  PLT 38* 57*  --  53*   BMET Recent Labs    01/11/19 0322 01/12/19 0529 01/12/19 1408 01/13/19 0403  NA 135 137 140 139  K 3.4* 3.2* 3.7 3.7  CL 98 99  --  106  CO2 27 25  --  25  GLUCOSE 121* 86  --  106*  BUN 5* 9  --  24*  CREATININE 0.65 0.76  --  2.45*  CALCIUM 8.6* 8.9  --  8.0*   LFT Recent Labs    01/13/19 0403  PROT 5.8*  ALBUMIN 1.9*  AST 183*  ALT 67*  ALKPHOS 84  BILITOT 33.9*   PT/INR Recent Labs    01/12/19 0529 01/13/19 0403  LABPROT 29.2* 30.3*  INR 2.8* 3.0*      Assessment /  Plan:    #105 38 year old white male alcoholic admitted with severe acute alcoholic hepatitis with initial discriminant function score of 86  Day #3 prednisone 40 mg p.o. daily  He has not had any improvement on prednisone thus far, INR bit worse today.  Parameters are very worrisome for fulminant hepatic failure.  #2 acute kidney injury abrupt over the past 24 hours-is a very high risk for hepatorenal syndrome and will manage as acute HRS  Check spot urine sodium 1 L normal saline bolus now Subcu octreotide 100 mg 3 times daily IV albumin 1 g/kg today, patient also had IV albumin yesterday.  We will then continue 50 g/day. Change to full liquid diet 2 g sodium  #3 acute alcohol withdrawal with DTs- Precedex has been discontinued, Continue Librium and Ativan  #4 altered mental status-difficult to determine how much of this is secondary to EtOH withdrawal, sedatives and what component may represent hepatic encephalopathy  Ammonia level today Have increased lactulose to 30 cc 3 times daily   Patient is critically ill, prognosis extremely guarded. Dr.[name] has contacted [facility] hepatology transplant service.  Do not think patient will be a transplant candidate, will look for their advice regarding benefit  to transferring to a transplant center.    Active Problems:   Alcohol withdrawal syndrome with complication (HCC)   Alcoholic hepatitis   Alcohol withdrawal ([place])     LOS: 3 days   Shanecia Hoganson PA-C  01/13/2019, 10:02 AM

## 2019-01-13 NOTE — Evaluation (Signed)
Clinical/Bedside Swallow Evaluation Patient Details  Name: [name] MRN: [id number] Date of Birth: 02/18/1981  Today's Date: 01/13/2019 Time: SLP Start Time (ACUTE ONLY): 1440 SLP Stop Time (ACUTE ONLY): 1505 SLP Time Calculation (min) (ACUTE ONLY): 25 min  Past Medical History:  Past Medical History:  Diagnosis Date  . Alcoholic cirrhosis ([place])   . Cellulitis   . Cellulitis 10/2016   right leg  . Elevated liver function tests 10/2016  . 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 ETOH abuse   . Hepatitis C    Hepatitis C  . Paraspinal abscess ([place])   . Thrombocytopenia ([place]) 25/2018   Past Surgical History:  Past Surgical History:  Procedure Laterality Date  . DECOMPRESSIVE LUMBAR LAMINECTOMY LEVEL 1 N/A 11/01/2016   Procedure: DECOMPRESSIVE POSTERIOR LUMBAR LAMINECTOMY LEVEL 4-5;  Surgeon: Melina Schools, MD;  Location: [place];  Service: Orthopedics;  Laterality: N/A;  . HERNIA REPAIR Left    INGUNIAL  . I&D EXTREMITY Right 11/01/2016   Procedure: IRRIGATION AND DEBRIDEMENT ABSCESS RIGHT THIGH;  Surgeon: Melina Schools, MD;  Location: [place];  Service: Orthopedics;  Laterality: Right;  . INCISION AND DRAINAGE OF WOUND N/A 01/06/2017   Procedure: IRRIGATION AND DEBRIDEMENT and placement of wound vac;  Surgeon: Melina Schools, MD;  Location: [place];  Service: Orthopedics;  Laterality: N/A;  . INCISION AND DRAINAGE OF WOUND N/A 12/02/2016   Procedure: WASHOUT AND CLOSURE OF WOUND;  Surgeon: Melina Schools, MD;  Location: [place];  Service: Orthopedics;  Laterality: N/A;  . LUMBAR WOUND DEBRIDEMENT N/A 11/12/2016   Procedure: LUMBAR WOUND DEBRIDEMENT;  Surgeon: Melina Schools, MD;  Location: [place];  Service: Orthopedics;  Laterality: N/A;   HPI:  Pt is a 38 yo male admitted with jaundice 2/2 acute on chronic alcoholic hepatitis, being tx also for alcohol withdrawal with DTs. Prior FEES 10/2016 post-extubation showed swallow function WFL. PMH includes: alcoholic cirrhosis, ETOH abuse,  alcohol induced pancreatitis, hepatits C   Assessment / Plan / Recommendation Clinical Impression  Pt is drowsy and his oral preparation appears sluggish, but generally functional given additional time. He consistently elicits a pharyngeal swallow, with a single cough following his first sip but not observed again, even with large, sequential boluses. Pt remains at risk for episodic aspiration in the setting of fluctuating mentation and intermittently high RR. Would continue with full liquid diet (as recommended by GI) with full supervision and careful monitoring to select appropriate times to eat and drink and facilitate careful use of aspiration precautions. Will f/u for tolerance and additional modifications as needed. SLP Visit Diagnosis: Dysphagia, unspecified (R13.10)    Aspiration Risk  Moderate aspiration risk    Diet Recommendation Thin liquid;Other (Comment)(full liquid diet per GI)   Liquid Administration via: Cup;Straw Medication Administration: Whole meds with puree(crush PRN) Supervision: Staff to assist with self feeding;Full supervision/cueing for compensatory strategies Compensations: Minimize environmental distractions;Slow rate;Small sips/bites Postural Changes: Seated upright at 90 degrees;Remain upright for at least 30 minutes after po intake    Other  Recommendations Oral Care Recommendations: Oral care BID   Follow up Recommendations ([place])      Frequency and Duration min 2x/week  2 weeks       Prognosis Prognosis for Safe Diet Advancement: Good      Swallow Study   General HPI: Pt is a 38 yo male admitted with jaundice 2/2 acute on chronic alcoholic hepatitis, being tx also for alcohol withdrawal with DTs. Prior FEES 10/2016 post-extubation showed swallow function WFL. PMH  includes: alcoholic cirrhosis, ETOH abuse, alcohol induced pancreatitis, hepatits C Type of Study: Bedside Swallow Evaluation Previous Swallow Assessment: see HPI Diet Prior to this Study:  Thin liquids;Other (Comment)(full liquids) Temperature Spikes Noted: No Respiratory Status: Nasal cannula History of Recent Intubation: No Behavior/Cognition: Lethargic/Drowsy;Cooperative;Requires cueing Oral Cavity Assessment: Within Functional Limits Oral Care Completed by SLP: No Oral Cavity - Dentition: Adequate natural dentition Vision: Functional for self-feeding Self-Feeding Abilities: Needs assist Patient Positioning: Upright in bed Baseline Vocal Quality: Normal Volitional Swallow: Able to elicit    Oral/Motor/Sensory Function Overall Oral Motor/Sensory Function: Generalized oral weakness   Ice Chips     Thin Liquid Thin Liquid: Impaired Presentation: Cup;Straw Pharyngeal  Phase Impairments: Cough - Immediate(x1)    Nectar Thick Nectar Thick Liquid: Not tested   Honey Thick Honey Thick Liquid: Not tested   Puree Puree: Within functional limits Presentation: Spoon   Solid     Solid: Not tested      [name] [name] 01/13/2019,3:22 PM  [name], M.A. [facility] Environmental education officer [phone] Office [phone]

## 2019-01-13 NOTE — Progress Notes (Addendum)
NAME:  [name], MRN:  [id number], DOB:  20-Feb-1981, LOS: 3 ADMISSION DATE:  01/09/2019, CONSULTATION DATE:  01/12/2019 REFERRING MD:  Dr Madison Hickman, CHIEF COMPLAINT:  Alcohol withdrawal   Brief History   38 year old gentleman who was admitted with jaundice Background history of hepatitis C, alcoholism, liver cirrhosis, alcohol induced pancreatitis Been treated for alcohol withdrawal Drinks approximately 1 pint of liquor daily  Past Medical History   Past Medical History:  Diagnosis Date  . Alcoholic cirrhosis ([place])   . Cellulitis   . Cellulitis 10/2016   right leg  . Elevated liver function tests 10/2016  . 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 ETOH abuse   . Hepatitis C    Hepatitis C  . Paraspinal abscess ([place])   . Thrombocytopenia ([place]) 25/2018    Significant Hospital Events   4/16: Continue tachycardia; Agitation; PCCM consulted, briefly required Precedex however this did precipitate hypotension. 4/17: Calm.  Off Precedex  Consults:  pccm 01/12/2019 GI-01/10/2019  Procedures:  none  Significant Diagnostic Tests:  Abdominal ultrasound on 01/10/2019 Increased gallbladder wall thickening with mild pericholecystic fluid. This may represent acute cholecystitis in the appropriate clinical setting. HIDA scan may be helpful to assess for gallbladder function.Fatty liver.Hepatofugal portal flow. Micro Data:  None  Antimicrobials:  None  Interim history/subjective:  Resting comfortably   Objective   Blood pressure 101/75, pulse 90, temperature 98.5 F (36.9 C), temperature source Oral, resp. rate (Abnormal) 31, height 5\' 11"  (1.803 m), weight 128 kg, SpO2 97 %.        Intake/Output Summary (Last 24 hours) at 01/13/2019 0836 Last data filed at 01/13/2019 0500 Gross per 24 hour  Intake 3535.36 ml  Output 450 ml  Net 3085.36 ml   Filed Weights   01/09/19 2146 01/10/19 1141 01/13/19 0500  Weight: 113.4 kg 113.4 kg 128 kg    Examination:  General: This is an obese 38 year old chronically ill-appearing male patient he is currently sedated but appears comfortable in bed HEENT atraumatic normocephalic icteric sclera mucous membranes moist Cardiac: Regular rate and rhythm without murmur rub or gallop Pulmonary: Clear to auscultation snoring respirations at times no accessory use equal chest rise Abdomen: Soft, nontender GU: Voiding with condom catheter in place approximately 300 cc of residual urine estimated by ultrasound Neuro: Opens eyes after repeated verbal stimulus moves spontaneously no focal motor deficits appreciated  Resolved Hospital Problem list     Assessment & Plan:   Acute metabolic encephalopathy with acute delirium tremens/alcohol withdrawal -Was briefly on Precedex on 4/16 this is since been discontinued Plan We will add Librium taper Continue safety sitter Frequent reorientation Thiamine and folate Typically would also try clonidine however as he has not required much in the way of Precedex and he had hypotension yesterday we will try without this  Acute kidney injury.  Suspect this is a consequence of hypotension yesterday resulting in low flow injury Plan Continuing IV fluid hydration Hold antihypertensives and diuretics Strict intake and output Intermittent bladder scan A.m. chemistry  Acute alcoholic hepatitis Plan Continue to trend LFTs Prednisone per GI medicine Needs alcohol cessation  Alcohol-related cirrhosis with elevated meld score Plan Continuing lactulose GI following  History of hepatitis C; treated Plan Follow-up viral load per GI  At risk for fluid and electrolyte imbalance: Plan Replace magnesium, in setting of alcoholism he is at borderline   Acute pancreatitis Plan Continuing bowel rest for now Repeat lipase with next set of labs  Thrombocytopenia Plan Trend CBC  Best practice:  Diet: As tolerated, monitor closely may need to be n.p.o. with sedation  Pain/Anxiety/Delirium protocol (if indicated): Precedex/Ativan VAP protocol (if indicated): Not indicated DVT prophylaxis: Heparin/SCD GI prophylaxis: Protonix Glucose control:  Mobility: Bedrest Code Status: Full code Family Communication:  Disposition: Would continue ICU level monitoring for another 24 hours, will work on transitioning to a better regimen. Erick Colace ACNP-BC [place] Pager # [phone] OR # [phone] if no answer

## 2019-01-13 NOTE — Progress Notes (Addendum)
FPTS Interim Progress Note  Addendum: Patient in ICU for precedex managed by CCM who is consulted for this. FPTS is primary and full progress note to follow for today.  Bufford Lope, DO 01/13/2019, 7:16 AM PGY-3, [place] [facility] pager [phone]

## 2019-01-13 NOTE — Progress Notes (Signed)
Patient oriented to self and time. With med pass RN gave patient water and he seemed to swallow with ease.  Meds were given.   Upon trying to feed him his breakfast tray ordered, he was having further difficulty swallowing.  RN attempted water again, but patient coughed and seemed to get choked.  SLP eval placed.  Patient NPO.  RN will continue to monitor.

## 2019-01-13 NOTE — Plan of Care (Signed)
  Problem: Safety: Goal: Ability to remain free from injury will improve Outcome: Progressing   Problem: Physical Regulation: Goal: Complications related to the disease process, condition or treatment will be avoided or minimized Outcome: Progressing

## 2019-01-13 NOTE — Progress Notes (Signed)
Family Medicine Teaching Service Daily Progress Note Intern Pager: [phone]  Patient name: [name] Medical record number: [id number] Date of birth: 1980/10/03 Age: 37 y.o. Gender: male  Primary Care Provider: Nicholes Rough, PA-C Consultants: GI Code Status: Full  Pt Overview and Major Events to Date:  4/14 - admit for acute hepatitis 4/16 - CIWA 25, pt refused librium, PCCM consulted for precedex 4/17 - off precedex  Assessment and Plan: [name] is a 38 y.o. male presented with jaundice 2/2 acute on chronic alcoholic hepatitis. PMH is significant for hepatitis C s/p Mavyret therapy, alcoholism, liver cirrhosis, alcoholic induced pancreatitis, and thrombocytopenia.   Acute alcoholic hepatitis. MELD score 33 with poor prognosis and profound cholestasis and jaundice. Plt at 53 and INR 3.0 appears to have stabilized -continue prednisolone (4/14-) -per GI continue steroids and monitor response, will likely need at least 1 month and will need coordination with outpt hepatologist for close follow up. Monitor LFTs and INR closely. No need for HIDA.  -Vitamin K (4/14-4/17) per GI -Zofran PRN -Daily CMP, PT/INR -continue lactulose qd  Alcohol withdrawal with DTs. Last drink 01/09/19. Required precedex overnight managed by CCM but now improved but still in acute delirium -transition to librium taper today -monitor on CIWA with prn Ativan - continue folate, thiamine, multivitamin - safety sitter  AKI. Likely due to hypotension resulting in low lfow injury - continue IV hydration with D5 NS@125cc /hr. Hopeful that as delirium clears that can get nutrition that will help as well otherwise may need to consider soft feeding tube for the future.  - monitor Cr closely - avoid nephrotoxic medications - intermittent bladder scan, RN to get PVR - strict I/Os  History of hep C. Treated. - hep C quant neg  Thrombocytopenia, chronic -Monitor daily CBC and INR -SCDs  FEN/GI:  general diet PPx: SCDs  Disposition: pending medical management of alcohol withdrawal. Likely will be able to transfer out of ICU 4/18  Subjective:  Patient was oriented to person with garbled speech. Resting comfortably   Objective: Temp:  [98.5 F (36.9 C)-99.7 F (37.6 C)] 98.5 F (36.9 C) (04/17 0758) Pulse Rate:  [46-115] 90 (04/17 0800) Resp:  [20-48] 31 (04/17 0800) BP: (71-160)/(42-144) 101/75 (04/17 0800) SpO2:  [91 %-99 %] 97 % (04/17 0800) Weight:  [465 kg] 128 kg (04/17 0500)  General: Mitts in place. Significantly jaundiced with bilateral scleral icterus.  Redirectable this morning HEENT: Significant scleral icterus  Cardio: Regular rhythm, normal S1 and S2. Soft systolic murmur. No rubs Pulm: CTAB, NWOB on RA Abdomen: soft, nontender, distended, + bowel sounds. No fluid wave  Extremities: WWP, no LE edema Neuro: somnolent, oriented to self. Opens eyes to voice.    Laboratory: Recent Labs  Lab 01/11/19 0322 01/12/19 0529 01/12/19 1408 01/13/19 0403  WBC 7.8 10.3  --  8.9  HGB 13.0 13.1 12.2* 11.7*  HCT 37.3* 37.7* 36.0* 34.7*  PLT 38* 57*  --  53*   INR 2.9  Recent Labs  Lab 01/11/19 0322 01/12/19 0529 01/12/19 1408 01/13/19 0403  NA 135 137 140 139  K 3.4* 3.2* 3.7 3.7  CL 98 99  --  106  CO2 27 25  --  25  BUN 5* 9  --  24*  CREATININE 0.65 0.76  --  2.45*  CALCIUM 8.6* 8.9  --  8.0*  PROT 6.6 6.6  --  5.8*  BILITOT 32.8* 34.8*  --  33.9*  ALKPHOS 114 115  --  84  ALT 66* 66*  --  67*  AST 191* 166*  --  183*  GLUCOSE 121* 86  --  106*    Imaging/Diagnostic Tests: No results found.  Jose Lope, DO 01/13/2019, 8:16 AM PGY-3, [place] Intern pager: [phone], text pages welcome

## 2019-01-13 NOTE — Plan of Care (Signed)
  Problem: Activity: Goal: Risk for activity intolerance will decrease Outcome: Progressing   Problem: Nutrition: Goal: Adequate nutrition will be maintained Outcome: Progressing   Problem: Pain Managment: Goal: General experience of comfort will improve Outcome: Progressing   Problem: Safety: Goal: Ability to remain free from injury will improve Outcome: Progressing   Problem: Physical Regulation: Goal: Complications related to the disease process, condition or treatment will be avoided or minimized Outcome: Progressing

## 2019-01-14 LAB — COMPREHENSIVE METABOLIC PANEL
ALT: 73 U/L — ABNORMAL HIGH (ref 0–44)
AST: 195 U/L — ABNORMAL HIGH (ref 15–41)
Albumin: 2.3 g/dL — ABNORMAL LOW (ref 3.5–5.0)
Alkaline Phosphatase: 95 U/L (ref 38–126)
Anion gap: 11 (ref 5–15)
BUN: 21 mg/dL — ABNORMAL HIGH (ref 6–20)
CO2: 23 mmol/L (ref 22–32)
Calcium: 8.2 mg/dL — ABNORMAL LOW (ref 8.9–10.3)
Chloride: 111 mmol/L (ref 98–111)
Creatinine, Ser: 1.44 mg/dL — ABNORMAL HIGH (ref 0.61–1.24)
GFR calc Af Amer: >60 mL/min (ref >60)
GFR calc non Af Amer: >60 mL/min (ref >60)
Glucose, Bld: 152 mg/dL — ABNORMAL HIGH (ref 70–99)
Potassium: 3.5 mmol/L (ref 3.5–5.1)
Sodium: 145 mmol/L (ref 135–145)
Total Bilirubin: 36 mg/dL (ref 0.3–1.2)
Total Protein: 6.2 g/dL — ABNORMAL LOW (ref 6.5–8.1)

## 2019-01-14 LAB — CBC
HCT: 35 % — ABNORMAL LOW (ref 39.0–52.0)
Hemoglobin: 11.7 g/dL — ABNORMAL LOW (ref 13.0–17.0)
MCH: 33.1 pg (ref 26.0–34.0)
MCHC: 33.4 g/dL (ref 30.0–36.0)
MCV: 99.2 fL (ref 80.0–100.0)
Platelets: 62 10*3/uL — ABNORMAL LOW (ref 150–400)
RBC: 3.53 MIL/uL — ABNORMAL LOW (ref 4.22–5.81)
RDW: 21.2 % — ABNORMAL HIGH (ref 11.5–15.5)
WBC: 8.9 10*3/uL (ref 4.0–10.5)
nRBC: 0.2 % (ref 0.0–0.2)

## 2019-01-14 LAB — MAGNESIUM: Magnesium: 2.5 mg/dL — ABNORMAL HIGH (ref 1.7–2.4)

## 2019-01-14 LAB — PROTIME-INR
INR: 2.7 — ABNORMAL HIGH (ref 0.8–1.2)
Prothrombin Time: 28.3 seconds — ABNORMAL HIGH (ref 11.4–15.2)

## 2019-01-14 MED ORDER — ORAL CARE MOUTH RINSE
15.0000 mL | Freq: Two times a day (BID) | OROMUCOSAL | Status: DC
  Administered 2019-01-14 – 2019-01-17 (×7): 15 mL via OROMUCOSAL

## 2019-01-14 NOTE — Progress Notes (Signed)
Progress note for [facility]  Subjective: No new complaints.  Objective: Vital signs in last 24 hours: Temp:  [97.4 F (36.3 C)-99.1 F (37.3 C)] 97.4 F (36.3 C) (04/18 0400) Pulse Rate:  [72-90] 80 (04/18 1100) Resp:  [19-36] 26 (04/18 1100) BP: (93-129)/(55-83) 122/72 (04/18 1100) SpO2:  [85 %-97 %] 97 % (04/18 1100) Weight:  [126.5 kg] 126.5 kg (04/18 0500) Last BM Date: 01/10/19  Intake/Output from previous day: 04/17 0701 - 04/18 0700 In: 4353.1 [P.O.:320; I.V.:2953.7; IV Piggyback:1079.4] Out: 4300 [Urine:4300] Intake/Output this shift: Total I/O In: -  Out: 825 [Urine:825]  General appearance: awake, oriented to place Resp: clear to auscultation bilaterally Cardio: regular rate and rhythm GI: soft, non-tender; bowel sounds normal; no masses,  no organomegaly Extremities: + asterixis  Lab Results: Recent Labs    01/12/19 0529 01/12/19 1408 01/13/19 0403 01/14/19 0330  WBC 10.3  --  8.9 8.9  HGB 13.1 12.2* 11.7* 11.7*  HCT 37.7* 36.0* 34.7* 35.0*  PLT 57*  --  53* 62*   BMET Recent Labs    01/13/19 0403 01/13/19 1417 01/14/19 0330  NA 139 144 145  K 3.7 3.7 3.5  CL 106 110 111  CO2 25 20* 23  GLUCOSE 106* 142* 152*  BUN 24* 25* 21*  CREATININE 2.45* 2.22* 1.44*  CALCIUM 8.0* 8.0* 8.2*   LFT Recent Labs    01/14/19 0330  PROT 6.2*  ALBUMIN 2.3*  AST 195*  ALT 73*  ALKPHOS 95  BILITOT 36.0*   PT/INR Recent Labs    01/13/19 1417 01/14/19 0330  LABPROT 28.4* 28.3*  INR 2.7* 2.7*   Hepatitis Panel No results for input(s): HEPBSAG, HCVAB, HEPAIGM, HEPBIGM in the last 72 hours. C-Diff No results for input(s): CDIFFTOX in the last 72 hours. Fecal Lactopherrin No results for input(s): FECLLACTOFRN in the last 72 hours.  Studies/Results: No results found.  Medications:  Scheduled: . chlordiazePOXIDE  50 mg Oral TID  . folic acid  1 mg Oral Daily  . lactulose  20 g Oral TID  . mouth rinse  15 mL Mouth Rinse BID  . multivitamin  with minerals  1 tablet Oral Daily  . octreotide  200 mcg Subcutaneous TID  . pantoprazole (PROTONIX) IV  40 mg Intravenous QHS  . prednisoLONE  40 mg Oral Daily  . rifaximin  550 mg Oral BID  . thiamine  100 mg Oral Daily   Or  . thiamine  100 mg Intravenous Daily   Continuous: . dextrose 5 % and 0.9% NaCl 75 mL/hr at 01/14/19 0929    Assessment/Plan: 1) ETOH hepatitis. 2) Renal failure - improving. 3) Coagulopathy. 4) Encephalopathy. 5) ETOH abuse.   Clinically the patient is improving.  There is an improvement with his renal function and he remains at a mild hepatic encephalopathy.  His clinical status is still very tenuous and continued close monitoring is required.  The patient is NOT a transplant candidate.  Plan: 1) Continue prednisolone. 2) Continue albumin. 3) Treatment withdrawal. 4) Follow INR and renal function.   LOS: 4 days   Zaheer Wageman D 01/14/2019, 11:22 AM

## 2019-01-14 NOTE — Progress Notes (Signed)
Family Medicine Teaching Service Daily Progress Note Intern Pager: [phone]  Patient name: [name] Medical record number: [id number] Date of birth: 1981-01-18 Age: 38 y.o. Gender: male  Primary Care Provider: Nicholes Rough, PA-C Consultants: GI Code Status: Full  Pt Overview and Major Events to Date:  4/14 - admit for acute hepatitis 4/16 - CIWA 25, pt refused librium, PCCM consulted for precedex 4/17 - off precedex  Assessment and Plan: [name] is a 38 y.o. male presented with jaundice 2/2 acute on chronic alcoholic hepatitis. PMH is significant for hepatitis C s/p Mavyret therapy, alcoholism, liver cirrhosis, alcoholic induced pancreatitis, and thrombocytopenia.   Acute on chronic alcoholic hepatitis. MELD score 33 with poor prognosis and profound cholestasis and jaundice. Plt at 62 and INR 2.7 appears to have stabilized. S/p 3 doses vitamin K. -continue prednisolone (4/14-) per GI recs -Zofran PRN -Daily CMP, PT/INR -continue lactulose (currently TID)  Alcohol withdrawal with DTs. Last drink 01/09/19.  -continue librium taper -monitor on CIWA with prn Ativan - continue folate, thiamine, multivitamin - safety sitter -transfer out of ICU  AKI. improving Likely due to hypotension resulting in low flow injury - decrease IVF to 75cc/hr, pt continues on full liquid diet - monitor Cr closely - avoid nephrotoxic medications - strict I/Os  History of hep C. Treated. - hep C quant neg  Thrombocytopenia, chronic -Monitor daily CBC and INR -SCDs  FEN/GI: general diet PPx: SCDs  Disposition: pending medical management of alcohol withdrawal.  Subjective:  Pt oriented to person, circumstance, and location today. He would prefer we call his brother first, which I will do. He feels like eating, was on the bedpan.    Objective: Temp:  [97.4 F (36.3 C)-99.1 F (37.3 C)] 97.4 F (36.3 C) (04/18 0400) Pulse Rate:  [78-99] 85 (04/18 0700) Resp:  [19-36]  28 (04/18 0700) BP: (93-126)/(55-83) 118/68 (04/18 0700) SpO2:  [85 %-100 %] 94 % (04/18 0700) Weight:  [126.5 kg] 126.5 kg (04/18 0500)  General: Mitts in place. Significantly jaundiced with bilateral scleral icterus.  Redirectable this morning HEENT: Significant scleral icterus  Cardio: Regular rhythm, normal S1 and S2. Soft systolic murmur. No rubs Pulm: CTAB, NWOB on RA Abdomen: soft, nontender, distended, + bowel sounds. No fluid wave  Extremities: WWP, no LE edema Neuro: somnolent, oriented to self. Opens eyes to voice.    Laboratory: Recent Labs  Lab 01/12/19 0529 01/12/19 1408 01/13/19 0403 01/14/19 0330  WBC 10.3  --  8.9 8.9  HGB 13.1 12.2* 11.7* 11.7*  HCT 37.7* 36.0* 34.7* 35.0*  PLT 57*  --  53* 62*   INR 2.9  Recent Labs  Lab 01/13/19 0403 01/13/19 1417 01/14/19 0330  NA 139 144 145  K 3.7 3.7 3.5  CL 106 110 111  CO2 25 20* 23  BUN 24* 25* 21*  CREATININE 2.45* 2.22* 1.44*  CALCIUM 8.0* 8.0* 8.2*  PROT 5.8* 6.2* 6.2*  BILITOT 33.9* 33.6* 36.0*  ALKPHOS 84 99 95  ALT 67* 68* 73*  AST 183* 196* 195*  GLUCOSE 106* 142* 152*    Imaging/Diagnostic Tests: No results found.  Sela Hilding, MD 01/14/2019, 7:21 AM PGY-3, [place] Intern pager: [phone], text pages welcome

## 2019-01-14 NOTE — Progress Notes (Signed)
NAME:  [name], MRN:  [id number], DOB:  12/28/1980, LOS: 4 ADMISSION DATE:  01/09/2019, CONSULTATION DATE:  01/12/2019 REFERRING MD:  Dr Madison Hickman, CHIEF COMPLAINT:  Alcohol withdrawal   Brief History   38 year old gentleman who was admitted with jaundice Background history of hepatitis C, alcoholism, liver cirrhosis, alcohol induced pancreatitis Been treated for alcohol withdrawal Drinks approximately 1 pint of liquor daily  Past Medical History   Past Medical History:  Diagnosis Date  . Alcoholic cirrhosis ([place])   . Cellulitis   . Cellulitis 10/2016   right leg  . Elevated liver function tests 10/2016  . 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 ETOH abuse   . Hepatitis C    Hepatitis C  . Paraspinal abscess ([place])   . Thrombocytopenia ([place]) 25/2018    Significant Hospital Events   4/16: Continue tachycardia; Agitation; PCCM consulted, briefly required Precedex however this did precipitate hypotension. 4/17: Calm.  Off Precedex 4/18: Much more awake.  Appropriate.  Doing well with  Librium.  Renal function improved.  Ready to move out of intensive care Consults:  pccm 01/12/2019 GI-01/10/2019  Procedures:  none  Significant Diagnostic Tests:  Abdominal ultrasound on 01/10/2019 Increased gallbladder wall thickening with mild pericholecystic fluid. This may represent acute cholecystitis in the appropriate clinical setting. HIDA scan may be helpful to assess for gallbladder function.Fatty liver.Hepatofugal portal flow. Micro Data:  None  Antimicrobials:  None  Interim history/subjective:  Looks much better  Objective   Blood pressure 116/69, pulse 72, temperature (Abnormal) 97.4 F (36.3 C), temperature source Oral, resp. rate (Abnormal) 26, height 5\' 11"  (1.803 m), weight 126.5 kg, SpO2 95 %.        Intake/Output Summary (Last 24 hours) at 01/14/2019 0818 Last data filed at 01/14/2019 0700 Gross per 24 hour  Intake 4228.07 ml  Output 4300 ml  Net  -71.93 ml   Filed Weights   01/10/19 1141 01/13/19 0500 01/14/19 0500  Weight: 113.4 kg 128 kg 126.5 kg    Examination: General: This obese 38 year old male sitting in bed he is more awake and alert following commands today no distress  HEENT normocephalic atraumatic sclera are icteric  Pulmonary: Clear to auscultation diminished bases no accessory use  Cardiac: Regular rate and rhythm no murmur rub or gallop  Abdomen: Nontender positive bowel sounds tolerating diet  Extremities: Warm and dry brisk capillary refill  Neuro: Awake, oriented x2.  Following commands.  No focal deficits  Derm: Jaundice  GU: Bilious colored urine    Resolved Hospital Problem list     Assessment & Plan:   Acute metabolic encephalopathy with acute delirium tremens/alcohol withdrawal -Was briefly on Precedex on 4/16 this is since been discontinued Plan Continue Librium taper  Continue safety sitter  Continue thiamine and folate Supportive care we will add Librium taper   Acute kidney injury.  Suspect this is a consequence of hypotension yesterday resulting in low flow injury; also consideration for hepatorenal syndrome -This has improved significantly; with IV fluids Plan Continue gentle hydration Holding antihypertensives and diuretics A.m. chemistry Continuing subcutaneous octreotide  Acute alcoholic hepatitis, with alcohol related cirrhosis, prior treated hepatitis C and elevated meld score -GI spoke to [facility], he is not a transplant candidate Plan Prednisone for GI  Alcohol cessation  Trending LFTs  Continuing lactulose targeting 2-3 BMs a day Added rifaximin twice a day   At risk for fluid and electrolyte imbalance: Hypokalemia Plan Replace and recheck   Acute pancreatitis, tolerating diet Plan Lipase  in a.m.  Thrombocytopenia Plan See  Best practice:  Diet: As tolerated, monitor closely may need to be n.p.o. with sedation; diet started Pain/Anxiety/Delirium protocol (if  indicated): Precedex/Ativan; discontinued 4/17 VAP protocol (if indicated): Not indicated DVT prophylaxis: Heparin/SCD GI prophylaxis: Protonix Glucose control:  Mobility: Bedrest Code Status: Full code Family Communication:  Disposition: Looks much better we will move him out of the intensive care critical care will sign off    Erick Colace ACNP-BC [place] Pager # [phone] OR # [phone] if no answer

## 2019-01-14 NOTE — Progress Notes (Signed)
FPTS Interim Note:   On admission when the patient was oriented, as well as today, he asked me to use his brother [name] as his primary contact.  As such, I called [name] today to update on patient's care and give an overall explanation of his end-stage liver disease.  [name] expressed also that the patient's mother should not be the primary contact as she is easily overwhelmed and had another son passed away in the past year.  I explained in detail the [place] the etiology of the patient's end-stage liver disease as well as the poor prognosis suggested by his meld score.  I also explained the update from GI wherein he is not currently a transplant candidate.  [name] asked whether the patient may be able to go to an inpatient alcohol rehab facility at discharge, and I explained that this is possible but unlikely given the coronavirus outbreak and limited bed availability.  [name] did state that the patient lives with his girlfriend who is also an alcoholic.  I told [name] I would ask for social work to reach out to him with facility options when they return on Monday.  [name] is planning to speak to the patient's mother today to explain his overall prognosis.  Additionally, and introduced the concept of palliative care in terms of helping [place] determine his wishes and further explained prognosis and symptoms.  [name] it is agreeable to the palliative care consult, which I have placed.  Ralene Ok, MD

## 2019-01-15 DIAGNOSIS — K7031 Alcoholic cirrhosis of liver with ascites: Secondary | ICD-10-CM

## 2019-01-15 DIAGNOSIS — K7030 Alcoholic cirrhosis of liver without ascites: Secondary | ICD-10-CM

## 2019-01-15 LAB — COMPREHENSIVE METABOLIC PANEL
ALT: 77 U/L — ABNORMAL HIGH (ref 0–44)
AST: 182 U/L — ABNORMAL HIGH (ref 15–41)
Albumin: 2.2 g/dL — ABNORMAL LOW (ref 3.5–5.0)
Alkaline Phosphatase: 100 U/L (ref 38–126)
Anion gap: 11 (ref 5–15)
BUN: 16 mg/dL (ref 6–20)
CO2: 26 mmol/L (ref 22–32)
Calcium: 8.7 mg/dL — ABNORMAL LOW (ref 8.9–10.3)
Chloride: 106 mmol/L (ref 98–111)
Creatinine, Ser: 0.97 mg/dL (ref 0.61–1.24)
GFR calc Af Amer: >60 mL/min (ref >60)
GFR calc non Af Amer: >60 mL/min (ref >60)
Glucose, Bld: 130 mg/dL — ABNORMAL HIGH (ref 70–99)
Potassium: 3.4 mmol/L — ABNORMAL LOW (ref 3.5–5.1)
Sodium: 143 mmol/L (ref 135–145)
Total Bilirubin: 37.4 mg/dL (ref 0.3–1.2)
Total Protein: 6.8 g/dL (ref 6.5–8.1)

## 2019-01-15 LAB — CBC
HCT: 39.2 % (ref 39.0–52.0)
Hemoglobin: 13.2 g/dL (ref 13.0–17.0)
MCH: 33.4 pg (ref 26.0–34.0)
MCHC: 33.7 g/dL (ref 30.0–36.0)
MCV: 99.2 fL (ref 80.0–100.0)
Platelets: 62 10*3/uL — ABNORMAL LOW (ref 150–400)
RBC: 3.95 MIL/uL — ABNORMAL LOW (ref 4.22–5.81)
RDW: 21.1 % — ABNORMAL HIGH (ref 11.5–15.5)
WBC: 9 10*3/uL (ref 4.0–10.5)
nRBC: 0.3 % — ABNORMAL HIGH (ref 0.0–0.2)

## 2019-01-15 LAB — PROTIME-INR
INR: 2.6 — ABNORMAL HIGH (ref 0.8–1.2)
Prothrombin Time: 27.5 seconds — ABNORMAL HIGH (ref 11.4–15.2)

## 2019-01-15 LAB — LIPASE, BLOOD: Lipase: 298 U/L — ABNORMAL HIGH (ref 11–51)

## 2019-01-15 MED ORDER — LACTULOSE 10 GM/15ML PO SOLN
30.0000 g | Freq: Three times a day (TID) | ORAL | Status: DC
  Administered 2019-01-15 – 2019-01-17 (×7): 30 g via ORAL
  Filled 2019-01-15 (×7): qty 45

## 2019-01-15 MED ORDER — PANTOPRAZOLE SODIUM 40 MG PO TBEC
40.0000 mg | DELAYED_RELEASE_TABLET | Freq: Every day | ORAL | Status: DC
  Administered 2019-01-15 – 2019-01-16 (×2): 40 mg via ORAL
  Filled 2019-01-15 (×2): qty 1

## 2019-01-15 MED ORDER — CHLORDIAZEPOXIDE HCL 25 MG PO CAPS
25.0000 mg | ORAL_CAPSULE | Freq: Three times a day (TID) | ORAL | Status: DC
  Administered 2019-01-15 – 2019-01-16 (×3): 25 mg via ORAL
  Filled 2019-01-15 (×3): qty 1

## 2019-01-15 NOTE — Progress Notes (Signed)
Family Medicine Teaching Service Daily Progress Note Intern Pager: [phone]  Patient name: [name] Medical record number: [id number] Date of birth: 1981/09/25 Age: 38 y.o. Gender: male  Primary Care Provider: Nicholes Rough, PA-C Consultants: GI Code Status: Full  Pt Overview and Major Events to Date:  4/14 - admit for acute hepatitis 4/16 - CIWA 25, pt refused librium, PCCM consulted for precedex 4/17 - off precedex 4/18 - transferred out of ICU  Assessment and Plan: [name] is a 38 y.o. male presented with jaundice 2/2 acute on chronic alcoholic hepatitis. PMH is significant for hepatitis C s/p Mavyret therapy, alcoholism, liver cirrhosis, alcoholic induced pancreatitis, and thrombocytopenia.   Liver failure 2/2 Acute on chronic hepatitis (h/o alcoholism, hepatitis C s/p treatment, fatty liver). MELD score 31 with poor prognosis and profound cholestasis and jaundice. Not a transplant candidate. LFTs stable, bili trending up. Plts and INR stable. S/p 3 doses vitamin K. - continue prednisolone (4/14-) per GI recs, will need to know about targetted duration given poor prognosis. - Zofran PRN - Daily CMP, CBC, PT/INR - continue lactulose (currently TID), will increase dose for goal 2-3 BM per day - awaiting palliative recs  Alcohol withdrawal with DTs. Last drink 01/09/19. Off precedex, transferred out of ICU 4/18. Continues on librium taper. CIWA not checked overnight, last was 5 morning of 4/18. Last ativan 4/18. - continue librium taper, decreased dose to 25mg  TID 4/19. - monitor on CIWA with prn Ativan - continue folate, thiamine, multivitamin - safety sitter  AKI, resolved Likely due to hypotension resulting in low flow injury, now back to baseline. Continues with fluid wave on exam.  - d/c IVF - monitor Cr closely - avoid nephrotoxic medications - strict I/Os  History of hep C. Treated. - hep C quant neg  Thrombocytopenia, chronic -Monitor daily  CBC and INR -SCDs  ?OSA Noted desats while sleeping, snoring. No reported apnea. - CPAP qhs  FEN/GI: regular PPx: SCDs  Disposition: pending medical management of alcohol withdrawal.  Subjective:  Patient somnolent this am. Grumpy upon awakening. Repeatedly asking to take mittens off. Oriented x3.   Objective: Temp:  [98.2 F (36.8 C)-98.4 F (36.9 C)] 98.2 F (36.8 C) (04/19 0347) Pulse Rate:  [63-106] 74 (04/19 0600) Resp:  [19-30] 25 (04/19 0600) BP: (115-154)/(58-101) 135/100 (04/19 0600) SpO2:  [85 %-98 %] 94 % (04/19 0600) Weight:  [124.7 kg] 124.7 kg (04/19 0535)  Physical Exam: General: Mitts in place. Significantly jaundiced throughout whole body. Somnolent but arousable, oriented x3.  HEENT: Significant scleral icterus  Cardio: Regular rhythm, normal S1 and S2.  Pulm: CTAB, snoring. Normal WOB on RA. Abdomen: soft, nontender. +BS. Slight fluid wave.   Extremities: WWP, no LE edema Neuro: somnolent, oriented x3.   Laboratory: Recent Labs  Lab 01/12/19 0529 01/12/19 1408 01/13/19 0403 01/14/19 0330  WBC 10.3  --  8.9 8.9  HGB 13.1 12.2* 11.7* 11.7*  HCT 37.7* 36.0* 34.7* 35.0*  PLT 57*  --  53* 62*   INR 2.9  Recent Labs  Lab 01/13/19 1417 01/14/19 0330 01/15/19 0233  NA 144 145 143  K 3.7 3.5 3.4*  CL 110 111 106  CO2 20* 23 26  BUN 25* 21* 16  CREATININE 2.22* 1.44* 0.97  CALCIUM 8.0* 8.2* 8.7*  PROT 6.2* 6.2* 6.8  BILITOT 33.6* 36.0* 37.4*  ALKPHOS 99 95 100  ALT 68* 73* 77*  AST 196* 195* 182*  GLUCOSE 142* 152* 130*    Imaging/Diagnostic Tests:  No results found.  Rory Percy, DO 01/15/2019, 7:00 AM PGY-2, [place] Intern pager: [phone], text pages welcome

## 2019-01-15 NOTE — Progress Notes (Signed)
Progress note for [facility]  Subjective: Feeling better.  Objective: Vital signs in last 24 hours: Temp:  [98 F (36.7 C)-98.4 F (36.9 C)] 98 F (36.7 C) (04/19 0700) Pulse Rate:  [63-106] 72 (04/19 0800) Resp:  [19-30] 26 (04/19 0900) BP: (115-154)/(58-101) 141/97 (04/19 0700) SpO2:  [85 %-98 %] 92 % (04/19 0800) Weight:  [124.7 kg] 124.7 kg (04/19 0535) Last BM Date: 01/10/19  Intake/Output from previous day: 04/18 0701 - 04/19 0700 In: 3798 [P.O.:2040; I.V.:1758] Out: 5026 [Urine:5025; Stool:1] Intake/Output this shift: No intake/output data recorded.  General appearance: arousable, A&O x 3 GI: soft, non-tender; bowel sounds normal; no masses,  no organomegaly Extremities: improved asterixis  Lab Results: Recent Labs    01/12/19 1408 01/13/19 0403 01/14/19 0330  WBC  --  8.9 8.9  HGB 12.2* 11.7* 11.7*  HCT 36.0* 34.7* 35.0*  PLT  --  53* 62*   BMET Recent Labs    01/13/19 1417 01/14/19 0330 01/15/19 0233  NA 144 145 143  K 3.7 3.5 3.4*  CL 110 111 106  CO2 20* 23 26  GLUCOSE 142* 152* 130*  BUN 25* 21* 16  CREATININE 2.22* 1.44* 0.97  CALCIUM 8.0* 8.2* 8.7*   LFT Recent Labs    01/15/19 0233  PROT 6.8  ALBUMIN 2.2*  AST 182*  ALT 77*  ALKPHOS 100  BILITOT 37.4*   PT/INR Recent Labs    01/13/19 1417 01/14/19 0330  LABPROT 28.4* 28.3*  INR 2.7* 2.7*   Hepatitis Panel No results for input(s): HEPBSAG, HCVAB, HEPAIGM, HEPBIGM in the last 72 hours. C-Diff No results for input(s): CDIFFTOX in the last 72 hours. Fecal Lactopherrin No results for input(s): FECLLACTOFRN in the last 72 hours.  Studies/Results: No results found.  Medications:  Scheduled: . chlordiazePOXIDE  50 mg Oral TID  . folic acid  1 mg Oral Daily  . lactulose  20 g Oral TID  . mouth rinse  15 mL Mouth Rinse BID  . multivitamin with minerals  1 tablet Oral Daily  . octreotide  200 mcg Subcutaneous TID  . pantoprazole (PROTONIX) IV  40 mg Intravenous QHS  .  prednisoLONE  40 mg Oral Daily  . rifaximin  550 mg Oral BID  . thiamine  100 mg Oral Daily   Or  . thiamine  100 mg Intravenous Daily   Continuous: . dextrose 5 % and 0.9% NaCl 75 mL/hr at 01/15/19 0600    Assessment/Plan: 1) ETOH hepatitis. 2) AKI - resolved. 3) Hepatic encephalopathy - improved.   Clinically he is better.  He is still somnolent, but there is a distinct improvement.  The patient also reports feeling better.  The TB is slightly increased, but his renal function has normalized.  His asterixis this AM was not as noticeable as yesterday.  Plan: 1) Continue with prednisolone, rifaximin, and lactulose. 2) Keep albumin for one more day. 3) Continue to encourage proper nutrition. 4) Dr. Carlean Purl will resume care in the AM.  LOS: 5 days   Rashiya Lofland D 01/15/2019, 9:03 AM

## 2019-01-15 NOTE — Plan of Care (Signed)
  Problem: Clinical Measurements: Goal: Ability to maintain clinical measurements within normal limits will improve Outcome: Progressing   Problem: Activity: Goal: Risk for activity intolerance will decrease Outcome: Progressing   Problem: Nutrition: Goal: Adequate nutrition will be maintained Outcome: Progressing   Problem: Coping: Goal: Level of anxiety will decrease Outcome: Progressing   Problem: Elimination: Goal: Will not experience complications related to bowel motility Outcome: Progressing   Problem: Safety: Goal: Ability to remain free from injury will improve Outcome: Progressing   Problem: Skin Integrity: Goal: Risk for impaired skin integrity will decrease Outcome: Progressing

## 2019-01-15 NOTE — Plan of Care (Signed)
  Problem: Clinical Measurements: Goal: Ability to maintain clinical measurements within normal limits will improve 01/15/2019 1627 by Williams Che, RN Outcome: Progressing 01/15/2019 1611 by Williams Che, RN Outcome: Progressing   Problem: Activity: Goal: Risk for activity intolerance will decrease 01/15/2019 1627 by Williams Che, RN Outcome: Progressing 01/15/2019 1611 by Williams Che, RN Outcome: Progressing   Problem: Nutrition: Goal: Adequate nutrition will be maintained 01/15/2019 1627 by Williams Che, RN Outcome: Progressing 01/15/2019 1611 by Williams Che, RN Outcome: Progressing   Problem: Coping: Goal: Level of anxiety will decrease 01/15/2019 1627 by Williams Che, RN Outcome: Progressing 01/15/2019 1611 by Williams Che, RN Outcome: Progressing   Problem: Elimination: Goal: Will not experience complications related to bowel motility 01/15/2019 1627 by Williams Che, RN Outcome: Progressing 01/15/2019 1611 by Williams Che, RN Outcome: Progressing   Problem: Pain Managment: Goal: General experience of comfort will improve Outcome: Progressing   Problem: Safety: Goal: Ability to remain free from injury will improve 01/15/2019 1627 by Williams Che, RN Outcome: Progressing 01/15/2019 1611 by Williams Che, RN Outcome: Progressing   Problem: Skin Integrity: Goal: Risk for impaired skin integrity will decrease 01/15/2019 1627 by Williams Che, RN Outcome: Progressing 01/15/2019 1611 by Williams Che, RN Outcome: Progressing

## 2019-01-15 NOTE — Consult Note (Signed)
Consultation Note Date: 01/15/2019   Patient Name: [name]  DOB: 30-Sep-1980  MRN: [id number]  Age / Sex: 38 y.o., male  PCP: Nicholes Rough, PA-C Referring Physician: Zenia Resides, MD  Reason for Consultation: Establishing goals of care  HPI/Patient Profile: 38 y.o. male  with past medical history of liver cirrhosis r/t continued ETOH abuse, alcohol induced pancreatitis, thrombocytopenia admitted on 01/09/2019 with acute on chronic alcoholic hepatitis.   Clinical Assessment and Goals of Care: I spent some time today reviewing [name]'s records evident for progression and continued complications of his liver cirrhosis and alcohol abuse. Discussed with bedside RN who says that he has had fluctuating mentation and has periods of orientation mixed with periods of confusion and impulsiveness. High MELD score indicating overall poor prognosis with high risk or mortality over the next few months. Not a transplant candidate and would be eligible for hospice if desired.    I met with [name] today who has safety sitter at bedside. He is jaundice and sleepy. He does engage in conversation with me but is very confused. He speaks about "needing to go out in the yard" and wanting to "go for a walk" that he just needs to get out of here. He is too lethargic at this time to try and get out of bed and is easily redirected.   I will visit with [name] tomorrow and will reach out to his family. I see from the notes that there have been direct conversations regarding [name]'s poor prognosis and limited options with his brother, [name], who planned to speak with their mother about [name]'s status. I will continue to follow to have a conversation with [name] as his mentation appears to be improving. Will reach out to family after clarifying goals with [name]. If [name] continues to be confused I will reach out to  family for [name].   I will continue to follow.   Primary Decision Maker PATIENT will hopefully arouse to speak for himself    SUMMARY OF RECOMMENDATIONS   - Will continue to attempt conversations with patient.   Code Status/Advance Care Planning:  Full code   Symptom Management:   Per primary.   Palliative Prophylaxis:   Aspiration, Bowel Regimen and Delirium Protocol  Psycho-social/Spiritual:   Desire for further Chaplaincy support:yes  Additional Recommendations: Caregiving  Support/Resources, Education on Hospice and Grief/Bereavement Support  Prognosis:   < 6 months very likely  Discharge Planning: To Be Determined      Primary Diagnoses: Present on Admission: . Alcoholic hepatitis . Alcohol withdrawal ([place])   I have reviewed the medical record, interviewed the patient and family, and examined the patient. The following aspects are pertinent.  Past Medical History:  Diagnosis Date  . Alcoholic cirrhosis ([place])   . Cellulitis   . Cellulitis 10/2016   right leg  . Elevated liver function tests 10/2016  . 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 ETOH abuse   . Hepatitis C    Hepatitis C  . Paraspinal abscess ([place])   .  Thrombocytopenia ([place]) 25/2018   Social History   Socioeconomic History  . Marital status: Single    Spouse name: Not on file  . Number of children: Not on file  . Years of education: Not on file  . Highest education level: Not on file  Occupational History  . Not on file  Social Needs  . Financial resource strain: Not on file  . Food insecurity:    Worry: Not on file    Inability: Not on file  . Transportation needs:    Medical: Not on file    Non-medical: Not on file  Tobacco Use  . Smoking status: Former Smoker    Last attempt to quit: 01/04/2017    Years since quitting: 2.0  . Smokeless tobacco: Never Used  . Tobacco comment: was a social smoker- maybe 5 a week  Substance and Sexual Activity  . Alcohol use: Yes     Comment: occasionally  . Drug use: No    Comment: as a teen a few times  . Sexual activity: Not on file  Lifestyle  . Physical activity:    Days per week: Not on file    Minutes per session: Not on file  . Stress: Not on file  Relationships  . Social connections:    Talks on phone: Not on file    Gets together: Not on file    Attends religious service: Not on file    Active member of club or organization: Not on file    Attends meetings of clubs or organizations: Not on file    Relationship status: Not on file  Other Topics Concern  . Not on file  Social History Narrative  . Not on file   Family History  Problem Relation Age of Onset  . Diabetes Mother    Scheduled Meds: . chlordiazePOXIDE  50 mg Oral TID  . folic acid  1 mg Oral Daily  . lactulose  20 g Oral TID  . mouth rinse  15 mL Mouth Rinse BID  . multivitamin with minerals  1 tablet Oral Daily  . octreotide  200 mcg Subcutaneous TID  . pantoprazole (PROTONIX) IV  40 mg Intravenous QHS  . prednisoLONE  40 mg Oral Daily  . rifaximin  550 mg Oral BID  . thiamine  100 mg Oral Daily   Or  . thiamine  100 mg Intravenous Daily   Continuous Infusions: . dextrose 5 % and 0.9% NaCl 75 mL/hr at 01/15/19 1000   PRN Meds:.LORazepam, ondansetron (ZOFRAN) IV, sodium chloride flush Allergies  Allergen Reactions  . Acetaminophen Other (See Comments)    Due to liver issues   Review of Systems  Unable to perform ROS: Acuity of condition    Physical Exam Vitals signs and nursing note reviewed.  Constitutional:      Appearance: He is overweight. He is ill-appearing.     Comments: Jaundice   Cardiovascular:     Rate and Rhythm: Normal rate.  Pulmonary:     Effort: Pulmonary effort is normal. No tachypnea, accessory muscle usage or respiratory distress.  Abdominal:     General: There is distension.     Palpations: Abdomen is soft.  Skin:    Coloration: Skin is jaundiced.  Neurological:     Mental Status: He is  lethargic, disoriented and confused.     Comments: + asterixis     Vital Signs: BP 133/79   Pulse 80   Temp 98 F (36.7 C) (Oral)  Resp (!) 31   Ht _0  (1.803 m)   Wt 124.7 kg   SpO2 93%   BMI 38.34 kg/m  Pain Scale: 0-10   Pain Score: 0-No pain   SpO2: SpO2: 93 % O2 Device:SpO2: 93 % O2 Flow Rate: .O2 Flow Rate (L/min): 2 L/min  IO: Intake/output summary:   Intake/Output Summary (Last 24 hours) at 01/15/2019 1104 Last data filed at 01/15/2019 1000 Gross per 24 hour  Intake 3966.46 ml  Output 4201 ml  Net -234.54 ml    LBM: Last BM Date: 01/10/19 Baseline Weight: Weight: 113.4 kg Most recent weight: Weight: 124.7 kg     Palliative Assessment/Data:     Time In/out: 1030-1110 Time Total: 40 min Greater than 50%  of this time was spent counseling and coordinating care related to the above assessment and plan.  Signed by: Vinie Sill, NP Palliative Medicine Team Pager # [phone] (M-F 8a-5p) Team Phone # [phone] (Nights/Weekends)

## 2019-01-16 DIAGNOSIS — Z7189 Other specified counseling: Secondary | ICD-10-CM

## 2019-01-16 DIAGNOSIS — Z515 Encounter for palliative care: Secondary | ICD-10-CM

## 2019-01-16 LAB — COMPREHENSIVE METABOLIC PANEL
ALT: 85 U/L — ABNORMAL HIGH (ref 0–44)
AST: 161 U/L — ABNORMAL HIGH (ref 15–41)
Albumin: 2.1 g/dL — ABNORMAL LOW (ref 3.5–5.0)
Alkaline Phosphatase: 97 U/L (ref 38–126)
Anion gap: 11 (ref 5–15)
BUN: 15 mg/dL (ref 6–20)
CO2: 28 mmol/L (ref 22–32)
Calcium: 8.9 mg/dL (ref 8.9–10.3)
Chloride: 104 mmol/L (ref 98–111)
Creatinine, Ser: 0.86 mg/dL (ref 0.61–1.24)
GFR calc Af Amer: >60 mL/min (ref >60)
GFR calc non Af Amer: >60 mL/min (ref >60)
Glucose, Bld: 102 mg/dL — ABNORMAL HIGH (ref 70–99)
Potassium: 3.7 mmol/L (ref 3.5–5.1)
Sodium: 143 mmol/L (ref 135–145)
Total Bilirubin: 39.1 mg/dL (ref 0.3–1.2)
Total Protein: 7.2 g/dL (ref 6.5–8.1)

## 2019-01-16 MED ORDER — ALBUMIN HUMAN 25 % IV SOLN
50.0000 g | Freq: Once | INTRAVENOUS | Status: AC
  Administered 2019-01-16: 14:00:00 50 g via INTRAVENOUS
  Filled 2019-01-16: qty 200

## 2019-01-16 MED ORDER — ENSURE ENLIVE PO LIQD
237.0000 mL | Freq: Two times a day (BID) | ORAL | Status: DC
  Administered 2019-01-16 – 2019-01-17 (×3): 237 mL via ORAL

## 2019-01-16 NOTE — Progress Notes (Signed)
FPTS Interim Note:   Elmo Putt, NP with Palliative Care relayed that brother [name] and mother have questions about the patient's eligibility for a liver transplant. I called mom's number and she said "hello" then hung up when I said, "may I speak to [name]?" she hung up. I tried again and went to VM. Brother's number also went to VM. Left him a VM asking him to call the nurse and give any specific transplant questions he had to the nurse or give a good time for [place] to call him back. I was planning to clarify his question and relay these questions to GI.   Ralene Ok, MD

## 2019-01-16 NOTE — Progress Notes (Signed)
Palliative:  [name] is more awake and alert today. He is sitting in recliner and able to have conversation with me. He enjoys his pets (2 dogs and a cat) and enjoys cooking. 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   When discussing his declining liver function and failure I asked him what he understands about his disease process. He tells me "I know I have to stop drinking." We discussed further about him being open to resources and alternative plans to help him maintain sobriety and he agrees. I did mention that we believe his home environment is not the best and he agrees. He names his brother, [name], and his mother as his main support.   We discussed the goal of sobriety as a means to give him improved quality of life and hopefully to help him live a little longer. Acknowledging that this will not reverse the damage already done to his liver. We did discuss that his health will worsen. I inquired about his thoughts/wishes regarding EOL. He was very overwhelmed and says he has thought about this but does not know what he would want and has not discussed with his family. I urged him to consider his thoughts on life support and resuscitation at end of life. Emotional support provided. He gives permission to discuss his care with his brother and mother.   I called and spoke with brother, [name]. [name] has good understanding of his brother's cirrhosis and alcohol abuse. He is concerned that [name] does not grasp the real severity of his disease process and trajectory. Preston requests that we be very blunt and honest with Matt. [name] says [name] has been motivated to be sober in the past but this motivated has quickly faded previously. He inquires about a rehab facility in [place] that [facility] had a bed at this past fall but chose not to go. [name] also tells me that his brother is welcome at his home (but he has to be physically independent).   [name] says that his mother had questions about  liver transplant from liver donor and I explained that I do believe that the same rules apply requiring 6-12 months of sobriety but I am unsure. I also clearly told him that my fear is that Matt's prognosis is likely not going to allow him the time to qualify for a transplant even with sobriety maintained. [name] understands.   Discussed with Dr. Lindell Noe who will reach out to mother to address her questions. Also discussed with Michiel Cowboy, CSW.   Exam: Alert, more oriented. Jaundiced. No distress. Abd distended.   Plan: - CSW working on options upon d/c although options likely limited with young age, poor prognosis, and rehab options limited with COVID.  - Will continue my [place] conversation.   81 min  Vinie Sill, NP Palliative Medicine Team Pager # [phone] (M-F 8a-5p) Team Phone # [phone] (Nights/Weekends)

## 2019-01-16 NOTE — Evaluation (Signed)
Physical Therapy Evaluation Patient Details Name: [name] MRN: [id number] DOB: 03-Jun-1981 Today's Date: 01/16/2019   History of Present Illness  38 yo male admitted to ED on 4/13 with acute on chronic alcoholic hepatitis, pt's s/s including jaundice, N/V/D. PMH includes alcohol abuse with cirrhosis, cellulitis, HepC, pyogenic arthritis, liver failure, decompression/laminectomy of L4-L5 2018, multiple I&Ds including back and RLE.  Clinical Impression   Pt presents with generalized weakness, tremors UE>LE with fatigue, difficulty performing mobility tasks, and decreased activity tolerance. Pt to benefit from acute PT to address deficits. Pt ambulated hallway distance with RW, pt limited by fatigue and unsteadiness of gait. Pt required min-mod assist for mobility this session. PT recommending HHPT, dependent on if pt d/cs to alcohol-related rehabilitation.  PT to progress mobility as tolerated, and will continue to follow acutely.      Follow Up Recommendations Home health PT;Other (comment)(Vs inpatient alcohol rehab)    Equipment Recommendations  None recommended by PT    Recommendations for Other Services       Precautions / Restrictions Precautions Precautions: Fall Restrictions Weight Bearing Restrictions: No      Mobility  Bed Mobility Overal bed mobility: Needs Assistance Bed Mobility: Supine to Sit     Supine to sit: Min assist;HOB elevated     General bed mobility comments: Min assist for LE assist, trunk elevation. Pt requires very increased time.   Transfers Overall transfer level: Needs assistance Equipment used: Rolling walker (2 wheeled) Transfers: Sit to/from Stand Sit to Stand: Mod assist;From elevated surface         General transfer comment: Mod assist for power up, hip extension, steadying upon standing. Pt with very wide BOS upon initial standing, verbal cuing for bringing LEs into shoulder-width BOS. Verbal cuing also for hand placement  upon rising.   Ambulation/Gait Ambulation/Gait assistance: Min assist Gait Distance (Feet): 100 Feet Assistive device: Rolling walker (2 wheeled) Gait Pattern/deviations: Step-to pattern;Step-through pattern;Decreased stride length;Trunk flexed;Drifts right/left;Wide base of support;Narrow base of support;Shuffle Gait velocity: decr    General Gait Details: Min assist for steadying. Pt with UE tremors, especially with fatigue, during ambulation. Verbal cuing for pt to take larger steps and increase foot clearance, cuing unsuccessful.   Stairs            Wheelchair Mobility    Modified Rankin (Stroke Patients Only)       Balance Overall balance assessment: Needs assistance Sitting-balance support: Feet supported Sitting balance-Leahy Scale: Fair     Standing balance support: Bilateral upper extremity supported Standing balance-Leahy Scale: Poor Standing balance comment: reliant on UE support and PT for stability                              Pertinent Vitals/Pain Pain Assessment: No/denies pain    Home Living Family/patient expects to be discharged to:: Private residence Living Arrangements: Spouse/significant other(Per chart review, pt's girlfriend is also an alcoholic, family does not want him to d/c home with her) Available Help at Discharge: Family;Available PRN/intermittently Type of Home: House Home Access: Ramped entrance     Home Layout: One level Home Equipment: Walker - 2 wheels      Prior Function Level of Independence: Independent         Comments: Pt reports he has a RW "just in case I need it", but typically is independent in ADLs and mobility. Pt works for [employer], and reports being on his feet  all day with work.      Hand Dominance   Dominant Hand: Right    Extremity/Trunk Assessment   Upper Extremity Assessment Upper Extremity Assessment: Generalized weakness(difficulty lifting UEs >90* elevation  bilaterally)    Lower Extremity Assessment Lower Extremity Assessment: Generalized weakness(able to perform LAQ but pt posterior leans with this, difficulty performing hip abd/add/flexion during bed mobility)    Cervical / Trunk Assessment Cervical / Trunk Assessment: Normal  Communication   Communication: No difficulties  Cognition Arousal/Alertness: Lethargic Behavior During Therapy: WFL for tasks assessed/performed Overall Cognitive Status: Impaired/Different from baseline Area of Impairment: Attention;Memory;Following commands;Safety/judgement;Problem solving                   Current Attention Level: Selective Memory: Decreased short-term memory Following Commands: Follows one step commands consistently;Follows one step commands with increased time Safety/Judgement: Decreased awareness of safety;Decreased awareness of deficits   Problem Solving: Slow processing;Difficulty sequencing;Requires verbal cues;Requires tactile cues General Comments: Pt is drowsy during PT eval, but answers questions appropriately. Pt requires multimodal cuing for mobility, and increased time to follow commands.       General Comments General comments (skin integrity, edema, etc.): Pt on 1LO2 via [place] upon arrival to room. Pt removed from O2 for PT eval. Pt satting at 91% on return to room, placed back on 1LO2 to recover sats.    Exercises     Assessment/Plan    PT Assessment Patient needs continued PT services  PT Problem List Decreased strength;Decreased safety awareness;Decreased mobility;Decreased activity tolerance;Decreased balance;Decreased knowledge of use of DME       PT Treatment Interventions DME instruction;Functional mobility training;Balance training;Gait training;Therapeutic activities;Therapeutic exercise;Patient/family education    PT Goals (Current goals can be found in the Care Plan section)  Acute Rehab PT Goals Patient Stated Goal: none stated  PT Goal Formulation:  With patient Time For Goal Achievement: 01/30/19 Potential to Achieve Goals: Good    Frequency Min 3X/week   Barriers to discharge        Co-evaluation               AM-PAC PT "6 Clicks" Mobility  Outcome Measure Help needed turning from your back to your side while in a flat bed without using bedrails?: A Little Help needed moving from lying on your back to sitting on the side of a flat bed without using bedrails?: A Lot Help needed moving to and from a bed to a chair (including a wheelchair)?: A Lot Help needed standing up from a chair using your arms (e.g., wheelchair or bedside chair)?: A Lot Help needed to walk in hospital room?: A Little Help needed climbing 3-5 steps with a railing? : A Lot 6 Click Score: 14    End of Session Equipment Utilized During Treatment: Gait belt Activity Tolerance: Patient limited by fatigue Patient left: in chair;with chair alarm set;with call bell/phone within reach Nurse Communication: Mobility status PT Visit Diagnosis: Other abnormalities of gait and mobility (R26.89);Unsteadiness on feet (R26.81);Difficulty in walking, not elsewhere classified (R26.2);Muscle weakness (generalized) (M62.81)    Time: 2694-8546 PT Time Calculation (min) (ACUTE ONLY): 22 min   Charges:   PT Evaluation $PT Eval Low Complexity: 1 Low         Hydie Langan Conception Chancy, PT Acute Rehabilitation Services Pager [phone]  Office [phone]   Maesyn Frisinger D Elonda Husky 01/16/2019, 5:14 PM

## 2019-01-16 NOTE — TOC Progression Note (Signed)
Transition of Care [facility]) - Progression Note    Patient Details  Name: [name] MRN: [id number] Date of Birth: 06-01-1981  Transition [facility]) CM/SW [place], [place] Phone Number: 01/16/2019, 1:18 PM  Clinical Narrative:    Damaris Schooner with Elmo Putt with PMT.  We discussed pt supports and goals. Pt does not appear to remember receiving cessation resources from CSW on 3W. Pt has a brother and mother locally. Challenging disposition due to care needs and ability to rehab. Pt is likely not appropriate for [name], there are also few ETOH rehab choices should pt require significant physical assistance. Await PT/OT evaluations and any updates from PMT.    Expected Discharge Plan: Home/Self Care Barriers to Discharge: Continued Medical Work up  Expected Discharge Plan and Services Expected Discharge Plan: Home/Self Care       Living arrangements for the past 2 months: Single Family Home(one level) Expected Discharge Date: 01/12/19                         Social Determinants of Health (SDOH) Interventions    Readmission Risk Interventions No flowsheet data found.

## 2019-01-16 NOTE — Plan of Care (Signed)

## 2019-01-16 NOTE — Progress Notes (Signed)
Family Medicine Teaching Service Daily Progress Note Intern Pager: [phone]  Patient name: [name] Medical record number: [id number] Date of birth: Mar 17, 1981 Age: 38 y.o. Gender: male  Primary Care Provider: Nicholes Rough, PA-C Consultants: GI Code Status: Full  Pt Overview and Major Events to Date:  4/14 - admit for acute hepatitis 4/16 - CIWA 25, pt refused librium, PCCM consulted for precedex 4/17 - off precedex 4/18 - transferred out of ICU  Assessment and Plan: [name] is a 38 y.o. male presented with jaundice 2/2 acute on chronic alcoholic hepatitis. PMH is significant for hepatitis C s/p Mavyret therapy, alcoholism, liver cirrhosis, alcoholic induced pancreatitis, and thrombocytopenia.   Liver failure 2/2 Acute on chronic hepatitis (h/o alcoholism, hepatitis C s/p treatment, fatty liver). MELD score 31 with poor prognosis and profound cholestasis and jaundice. Not a transplant candidate. LFTs stable, bili trending up. Plts and INR stable 4/19. S/p 3 doses vitamin K. No ativan last 24 hours.  - continue prednisolone (4/14-) per GI recs - Zofran PRN - Daily CMP, CBC, PT/INR - continue lactulose (currently TID), will increase dose for goal 2-3 BM per day - awaiting palliative recs  Alcohol withdrawal with DTs. Last drink 01/09/19. Off precedex, transferred out of ICU 4/18. Continues on librium taper. CIWA scoring had expired, renewed order this am.  - discontinue librium  - monitor on CIWA with prn Ativan - continue folate, thiamine, multivitamin - safety sitter expires this afternoon   History of hep C. Treated. - hep C quant neg  Thrombocytopenia, chronic -Monitor daily CBC and INR -SCDs  ?OSA Noted desats while sleeping, snoring. No reported apnea. - CPAP qhs  FEN/GI: regular PPx: SCDs  Disposition: pending medical management of alcohol withdrawal. PT/OT consult ordered. Lives with another alcoholic, so that would not be an ideal dispo  location. Unsure if family can take him home.   Subjective:  Pt somnolent but oriented x4 this am. Doesn't recall speaking to family. I told him we were going to try to have PT work with him. Bedside sitter says pt has not tried to get out of bed other than when he needed to go to the bathroom.    Objective: Temp:  [98.2 F (36.8 C)-98.7 F (37.1 C)] 98.2 F (36.8 C) (04/20 0353) Pulse Rate:  [67-89] 67 (04/20 0353) Resp:  [16-31] 20 (04/20 0353) BP: (111-138)/(66-100) 120/76 (04/20 0353) SpO2:  [92 %-96 %] 93 % (04/20 0353)  Physical Exam: General: Mitts in place. Significantly jaundiced throughout whole body. Somnolent but arousable, oriented x3.  HEENT: Significant scleral icterus  Cardio: Regular rhythm, normal S1 and S2.  Pulm: CTAB, snoring. Normal WOB on RA. Abdomen: soft, nontender. +BS. Extremities: WWP, no LE edema Neuro: somnolent, oriented x3.   Laboratory: Recent Labs  Lab 01/13/19 0403 01/14/19 0330 01/15/19 0752  WBC 8.9 8.9 9.0  HGB 11.7* 11.7* 13.2  HCT 34.7* 35.0* 39.2  PLT 53* 62* 62*   INR 2.9  Recent Labs  Lab 01/14/19 0330 01/15/19 0233 01/16/19 0319  NA 145 143 143  K 3.5 3.4* 3.7  CL 111 106 104  CO2 23 26 28   BUN 21* 16 15  CREATININE 1.44* 0.97 0.86  CALCIUM 8.2* 8.7* 8.9  PROT 6.2* 6.8 7.2  BILITOT 36.0* 37.4* 39.1*  ALKPHOS 95 100 97  ALT 73* 77* 85*  AST 195* 182* 161*  GLUCOSE 152* 130* 102*    Imaging/Diagnostic Tests: No results found.  Sela Hilding, MD 01/16/2019, 8:01 AM PGY-3,  [place] Intern pager: [phone], text pages welcome

## 2019-01-16 NOTE — Progress Notes (Signed)
Patient ID: [name], male   DOB: 1981/03/02, 38 y.o.   MRN: [id number]    Progress Note   Subjective    Alert , oriented , tired of being" in the bed and being poked" Not requiring Ativan past 24 hours, eating without difficulty No c/o pain or nausea, stools loose on lactulose   Objective   Vital signs in last 24 hours: Temp:  [98.2 F (36.8 C)-98.7 F (37.1 C)] 98.2 F (36.8 C) (04/20 0353) Pulse Rate:  [67-89] 67 (04/20 0353) Resp:  [16-31] 20 (04/20 0353) BP: (112-138)/(76-100) 120/76 (04/20 0353) SpO2:  [92 %-96 %] 93 % (04/20 0353) Weight:  [409 kg] 123 kg (04/20 0813) Last BM Date: 01/16/19 General:   Deeply jaundiced white male in NAD Heart:  Regular rate and rhythm; no murmurs Lungs: Respirations even and unlabored, lungs CTA bilaterally Abdomen:  Soft, nontender and nondistended. Normal bowel sounds. Extremities:  Without edema. Neurologic:  Alert and oriented,  grossly normal neurologically mentation still a little slow, no asterixis Psych:  Cooperative. Normal mood and affect.  Intake/Output from previous day: 04/19 0701 - 04/20 0700 In: 1066.4 [P.O.:720; I.V.:346.4] Out: 2100 [Urine:2100] Intake/Output this shift: Total I/O In: 300 [P.O.:300] Out: -   Lab Results: Recent Labs    01/14/19 0330 01/15/19 0752  WBC 8.9 9.0  HGB 11.7* 13.2  HCT 35.0* 39.2  PLT 62* 62*   BMET Recent Labs    01/14/19 0330 01/15/19 0233 01/16/19 0319  NA 145 143 143  K 3.5 3.4* 3.7  CL 111 106 104  CO2 23 26 28   GLUCOSE 152* 130* 102*  BUN 21* 16 15  CREATININE 1.44* 0.97 0.86  CALCIUM 8.2* 8.7* 8.9   LFT Recent Labs    01/16/19 0319  PROT 7.2  ALBUMIN 2.1*  AST 161*  ALT 85*  ALKPHOS 97  BILITOT 39.1*   PT/INR Recent Labs    01/14/19 0330 01/15/19 0752  LABPROT 28.3* 27.5*  INR 2.7* 2.6*         Assessment / Plan:    #76 38 year old white male, admitted 1 week ago with severe acute alcoholic hepatitis. Initial discriminant  function score 86  Day #6 prednisone 40 mg daily-continue  Parameters are consistent with fulminant hepatic failure.  INR has finally plateaued and is mildly improved.  Bilirubin still 39  #2 early hepatorenal syndrome- improved, creatinine has normalized.  Continue octreotide and albumin today  #3 acute alcohol withdrawal with delirium tremens-resolving, he has not had need for Ativan over the past 24 hours, continues on Librium  Plan; continue aggressive supportive management Start mobilizing i.e. out of bed to chair and ask physical therapy to see Continue lactulose We had contacted transplant hepatology at [facility] on 01/13/2019.  They confirmed that he would not be a transplant candidate.    Active Problems:   Alcohol withdrawal syndrome with complication (HCC)   Alcoholic hepatitis   Alcohol withdrawal ([place])   AKI (acute kidney injury) ([place])     LOS: 6 days   [name]  01/16/2019, 9:56 AM

## 2019-01-16 NOTE — Progress Notes (Signed)
Initial Nutrition Assessment  DOCUMENTATION CODES:   Obesity unspecified  INTERVENTION:   -Continue MVI with minerals daily -Ensure Enlive po BID, each supplement provides 350 kcal and 20 grams of protein  NUTRITION DIAGNOSIS:   Increased nutrient needs related to chronic illness(ESLD) as evidenced by estimated needs.  GOAL:   Patient will meet greater than or equal to 90% of their needs  MONITOR:   PO intake, Supplement acceptance, Labs, Weight trends, Skin, I & O's  REASON FOR ASSESSMENT:   LOS    ASSESSMENT:   [name] is a 38 y.o. male presenting with jaundice in the setting of recent vomiting/diarrhea, concerning for acute on chronic alcoholic hepatitis. PMH is significant for hepatitis C s/p Mavyret therapy, alcoholism, liver cirrhosis, alcoholic induced pancreatitis, and thrombocytopenia.   Pt admitted with acute alcoholic hepatitis, in the setting of alcoholic cirrhosis.   Reviewed I/O's: -1 L x 24 hours and -1.2 L since admission  UOP: 2.1 L x 24 hours  Pt with waxing and waning mentation per chart review. RD deferred phone interview at this time secondary to mental status.   Per GI notes, pt is not a transplant candidate. Palliative care consult pending for goals of care discussions.   Pt with variable appetite; noted meal completion 40-100%, averaging around 75%. Intake varies with mentation. Pt was consuming 1 pint of liquid daily PTA.   Reviewed wt hx; noted wt increase, however, wt changes difficult to interpret in pts with cirrhosis, especially when ascites is present.   Due to increased nutrient needs and variable PO intake, pt would benefit from addition of nutritional supplements.   Albumin has a half-life of 21 days and is strongly affected by stress response and inflammatory process, therefore, do not expect to see an improvement in this lab value during acute hospitalization. When a patient presents with low albumin, it is likely skewed  due to the acute inflammatory response. Note that low albumin is no longer used to diagnose malnutrition; [place] uses the new malnutrition guidelines published by the American Society for Parenteral and Enteral Nutrition (A.S.P.E.N.) and the Academy of Nutrition and Dietetics (AND).    Labs reviewed.   NUTRITION - FOCUSED PHYSICAL EXAM:    Most Recent Value  Orbital Region  Unable to assess  Upper Arm Region  Unable to assess  Thoracic and Lumbar Region  Unable to assess  Buccal Region  Unable to assess  Temple Region  Unable to assess  Clavicle Bone Region  Unable to assess  Clavicle and Acromion Bone Region  Unable to assess  Scapular Bone Region  Unable to assess  Dorsal Hand  Unable to assess  Patellar Region  Unable to assess  Anterior Thigh Region  Unable to assess  Posterior Calf Region  Unable to assess  Edema (RD Assessment)  Unable to assess  Hair  Unable to assess  Eyes  Unable to assess  Mouth  Unable to assess  Skin  Unable to assess  Nails  Unable to assess       Diet Order:   Diet Order            Diet regular Room service appropriate? Yes; Fluid consistency: Thin  Diet effective now              EDUCATION NEEDS:   Not appropriate for education at this time  Skin:  Skin Assessment: Reviewed RN Assessment  Last BM:  01/16/19  Height:   Ht Readings from Last 1  Encounters:  01/10/19 5\' 11"  (1.803 m)    Weight:   Wt Readings from Last 1 Encounters:  01/16/19 123 kg    Ideal Body Weight:  78.2 kg  BMI:  Body mass index is 37.82 kg/m.  Estimated Nutritional Needs:   Kcal:  3748-2707  Protein:  115-130 grams  Fluid:  > 2.0 L    Giada Schoppe A. Jimmye Norman, RD, LDN, [place] Registered Dietitian II Certified Diabetes Care and Education Specialist Pager: [phone] After hours Pager: [phone]

## 2019-01-16 NOTE — Progress Notes (Signed)
  Speech Language Pathology Treatment: Dysphagia  Patient Details Name: [name] MRN: [id number] DOB: 08-20-1981 Today's Date: 01/16/2019 Time: 1694-5038 SLP Time Calculation (min) (ACUTE ONLY): 14 min  Assessment / Plan / Recommendation Clinical Impression  Pt has been advanced to regular solids by MD. He describes a variety of solids that he says he consumed for lunch and denies any trouble. His mentation appears to be somewhat improved, although he is still generally slow with processing, and he told me to get him a cup from his kitchen. No overt signs of dysphagia nor aspiration are observed. Recommend continuing regular solids and thin liquids. SLP will f/u briefly for tolerance.   HPI HPI: Pt is a 38 yo male admitted with jaundice 2/2 acute on chronic alcoholic hepatitis, being tx also for alcohol withdrawal with DTs. Prior FEES 10/2016 post-extubation showed swallow function WFL. PMH includes: alcoholic cirrhosis, ETOH abuse, alcohol induced pancreatitis, hepatits C      SLP Plan  Continue with current plan of care       Recommendations  Diet recommendations: Regular;Thin liquid Liquids provided via: Cup;Straw Medication Administration: Whole meds with liquid Supervision: Patient able to self feed;Intermittent supervision to cue for compensatory strategies Compensations: Slow rate;Small sips/bites;Minimize environmental distractions Postural Changes and/or Swallow Maneuvers: Seated upright 90 degrees                Oral Care Recommendations: Oral care BID Follow up Recommendations: 24 hour supervision/assistance SLP Visit Diagnosis: Dysphagia, unspecified (R13.10) Plan: Continue with current plan of care       GO                [name] Rasheen Bells 01/16/2019, 3:50 PM  Pollyann Glen, M.A. [place] Acute Environmental education officer [phone] Office [phone]

## 2019-01-16 NOTE — Plan of Care (Signed)
  Problem: Education: Goal: Knowledge of General Education information will improve Description Including pain rating scale, medication(s)/side effects and non-pharmacologic comfort measures Outcome: Progressing   Problem: Health Behavior/Discharge Planning: Goal: Ability to manage health-related needs will improve Outcome: Progressing   Problem: Clinical Measurements: Goal: Ability to maintain clinical measurements within normal limits will improve Outcome: Progressing Goal: Will remain free from infection Outcome: Progressing Goal: Diagnostic test results will improve Outcome: Progressing Goal: Respiratory complications will improve Outcome: Progressing Goal: Cardiovascular complication will be avoided Outcome: Progressing   Problem: Activity: Goal: Risk for activity intolerance will decrease Outcome: Progressing   Problem: Nutrition: Goal: Adequate nutrition will be maintained Outcome: Progressing   Problem: Coping: Goal: Level of anxiety will decrease Outcome: Progressing   Problem: Elimination: Goal: Will not experience complications related to bowel motility Outcome: Progressing Goal: Will not experience complications related to urinary retention Outcome: Progressing   Problem: Pain Managment: Goal: General experience of comfort will improve Outcome: Progressing   Problem: Safety: Goal: Ability to remain free from injury will improve Outcome: Progressing   Problem: Skin Integrity: Goal: Risk for impaired skin integrity will decrease Outcome: Progressing   Problem: Education: Goal: Knowledge of disease or condition will improve Outcome: Progressing Goal: Understanding of discharge needs will improve Outcome: Progressing   Problem: Health Behavior/Discharge Planning: Goal: Ability to identify changes in lifestyle to reduce recurrence of condition will improve Outcome: Progressing Goal: Identification of resources available to assist in meeting health  care needs will improve Outcome: Progressing   Problem: Physical Regulation: Goal: Complications related to the disease process, condition or treatment will be avoided or minimized Outcome: Progressing

## 2019-01-16 NOTE — Progress Notes (Signed)
Pt mother Eyvonne Mechanic called wanted the gastroenteritis doctor to contact her at [phone]. She has questions about getting help with pt disability. Also she wants to know if she can be a live liver donor for her son.

## 2019-01-17 ENCOUNTER — Telehealth: Payer: Self-pay | Admitting: *Deleted

## 2019-01-17 ENCOUNTER — Encounter (HOSPITAL_COMMUNITY): Payer: Self-pay | Admitting: Physician Assistant

## 2019-01-17 DIAGNOSIS — K7010 Alcoholic hepatitis without ascites: Secondary | ICD-10-CM

## 2019-01-17 LAB — PROTIME-INR
INR: 2.6 — ABNORMAL HIGH (ref 0.8–1.2)
Prothrombin Time: 27.8 seconds — ABNORMAL HIGH (ref 11.4–15.2)

## 2019-01-17 LAB — COMPREHENSIVE METABOLIC PANEL
ALT: 78 U/L — ABNORMAL HIGH (ref 0–44)
AST: 135 U/L — ABNORMAL HIGH (ref 15–41)
Albumin: 2 g/dL — ABNORMAL LOW (ref 3.5–5.0)
Alkaline Phosphatase: 86 U/L (ref 38–126)
Anion gap: 10 (ref 5–15)
BUN: 16 mg/dL (ref 6–20)
CO2: 27 mmol/L (ref 22–32)
Calcium: 8.7 mg/dL — ABNORMAL LOW (ref 8.9–10.3)
Chloride: 104 mmol/L (ref 98–111)
Creatinine, Ser: 0.86 mg/dL (ref 0.61–1.24)
GFR calc Af Amer: >60 mL/min (ref >60)
GFR calc non Af Amer: >60 mL/min (ref >60)
Glucose, Bld: 121 mg/dL — ABNORMAL HIGH (ref 70–99)
Potassium: 3.6 mmol/L (ref 3.5–5.1)
Sodium: 141 mmol/L (ref 135–145)
Total Bilirubin: 37.6 mg/dL (ref 0.3–1.2)
Total Protein: 6.3 g/dL — ABNORMAL LOW (ref 6.5–8.1)

## 2019-01-17 LAB — CBC
HCT: 37.5 % — ABNORMAL LOW (ref 39.0–52.0)
Hemoglobin: 12.9 g/dL — ABNORMAL LOW (ref 13.0–17.0)
MCH: 34.4 pg — ABNORMAL HIGH (ref 26.0–34.0)
MCHC: 34.4 g/dL (ref 30.0–36.0)
MCV: 100 fL (ref 80.0–100.0)
Platelets: 65 10*3/uL — ABNORMAL LOW (ref 150–400)
RBC: 3.75 MIL/uL — ABNORMAL LOW (ref 4.22–5.81)
RDW: 20.7 % — ABNORMAL HIGH (ref 11.5–15.5)
WBC: 8.8 10*3/uL (ref 4.0–10.5)
nRBC: 0 % (ref 0.0–0.2)

## 2019-01-17 MED ORDER — RIFAXIMIN 550 MG PO TABS: 550.0000 mg | ORAL_TABLET | Freq: Two times a day (BID) | ORAL | 0 refills | Status: AC

## 2019-01-17 MED ORDER — LACTULOSE 10 GM/15ML PO SOLN: 30.0000 g | Freq: Two times a day (BID) | ORAL | 0 refills | Status: DC

## 2019-01-17 MED ORDER — ENSURE ENLIVE PO LIQD: 237.0000 mL | Freq: Two times a day (BID) | ORAL | 12 refills | Status: AC

## 2019-01-17 MED ORDER — PANTOPRAZOLE SODIUM 40 MG PO TBEC: 40.0000 mg | DELAYED_RELEASE_TABLET | ORAL | 0 refills | Status: DC

## 2019-01-17 MED ORDER — PREDNISONE 20 MG PO TABS: 40.0000 mg | ORAL_TABLET | Freq: Every day | ORAL | 0 refills | Status: DC

## 2019-01-17 NOTE — TOC Progression Note (Addendum)
Transition of Care [facility]) - Progression Note    Patient Details  Name: [name] MRN: [id number] Date of Birth: 06-May-1981  Transition of Care [facility]) CM/SW Contact  Jacalyn Lefevre Edson Snowball, RN Phone Number: 01/17/2019, 11:16 AM  Clinical Narrative:   Update received call from Kief with [facility]. Patient's insurance does not cover home health. Patient's brother [name] is aware. [name] willing to work with patient at home if hospital PT can provide handouts on exercises . Paged PT and MD .  Damaris Schooner to Dr Lindell Noe. Plan is possible discharge today to brother Preston's home with HHPT. Called Preston [phone], awaiting call back.   Will need HHPT  And ot order and Preston's address.   [name] returned phone call. Patient will be going to Preston's address at discharge:  [address] [place], [place] [address]  [facility] phone number [phone].  Provided Medicare.gov list. First preference [facility]. Awaiting call back.[facility] has accepted HHPT and OT.Called Zack with [employer] for walker . [name] decided they do not need walker  Kennon Holter with [place] message. Also made referral to [facility] for Palliative Care to follow OP.  Ordered walker. Provided substance resources   Expected Discharge Plan: [place] Barriers to Discharge: No Barriers Identified  Expected Discharge Plan and Services Expected Discharge Plan: [place]   Discharge Planning Services: CM Consult Post Acute Care Choice: [place] arrangements for the past 2 months: Single Family Home Expected Discharge Date: 01/12/19                   HH Arranged: PT     Social Determinants of Health (SDOH) Interventions    Readmission Risk Interventions No flowsheet data found.

## 2019-01-17 NOTE — Progress Notes (Signed)
Pt trying to get OOB . Pt had removed tele box and gown. Pt with confusion, said his girlfriend was outside and he had to meet her. Reoriented him, frequent reminding needed. Pt continues to remove tele and cpap. Bed exit alarm maintained and call light and phone in reach. Rounds completed per MD orders and unit protocol.

## 2019-01-17 NOTE — Plan of Care (Signed)
?  Problem: Coping: ?Goal: Level of anxiety will decrease ?Outcome: Progressing ?  ?Problem: Safety: ?Goal: Ability to remain free from injury will improve ?Outcome: Progressing ?  ?

## 2019-01-17 NOTE — Progress Notes (Signed)
Patient ID: [name], male   DOB: [birth year], 38 y.o.   MRN: [id number]   GI Brief note -   Dr Hilarie Fredrickson agreeable to follow pt  As an outpatient. Will arrange a virtual visit early next week , and labs through our office on Monday 4/27

## 2019-01-17 NOTE — Telephone Encounter (Signed)
Patient is scheduled for 01/26/19 at 1:30 pm for virtual visit (will need to confirm whether patient will use zoom or webex). [place] for visit on Thursday per [name], PA-C. In addition, patient will need to come for labs on Tuesday, 01/24/19. Orders have been placed in Epic for this. I will contact patient tomorrow since he is currently inpatient.

## 2019-01-17 NOTE — Progress Notes (Signed)
Physical Therapy Treatment Patient Details Name: [name] MRN: [id number] DOB: 1981-03-04 Today's Date: 01/17/2019    History of Present Illness 38 yo male admitted to ED on 4/13 with acute on chronic alcoholic hepatitis, pt's s/s including jaundice, N/V/D. PMH includes alcohol abuse with cirrhosis, cellulitis, HepC, pyogenic arthritis, liver failure, decompression/laminectomy of L4-L5 2018, multiple I&Ds including back and RLE.    PT Comments    Pt performed gait training and functional mobility with minimal assistance.  Performed with OT for safety as patient agitated on arrival.  Pt continues to benefit from HHPT at d/c however CM informed PTA that he does not have Home health benefit.  PTA called brother preston and sent HEP via text through [place] for home use.     Follow Up Recommendations  Home health PT;Other (comment)(vs in patient alcohol rehab.  )     Equipment Recommendations  None recommended by PT    Recommendations for Other Services       Precautions / Restrictions Precautions Precautions: Fall Restrictions Weight Bearing Restrictions: No    Mobility  Bed Mobility Overal bed mobility: Needs Assistance Bed Mobility: Supine to Sit     Supine to sit: Min guard     General bed mobility comments: pt needs cues to balance himself at EOB. pt drowsy and needs incr time to arouse   Transfers Overall transfer level: Needs assistance Equipment used: Rolling walker (2 wheeled) Transfers: Sit to/from Stand Sit to Stand: Min assist         General transfer comment: pt with cues for RW safety and use  Ambulation/Gait Ambulation/Gait assistance: Min assist Gait Distance (Feet): 80 Feet Assistive device: Rolling walker (2 wheeled) Gait Pattern/deviations: Step-to pattern;Step-through pattern;Decreased stride length;Trunk flexed;Drifts right/left;Wide base of support;Narrow base of support;Shuffle Gait velocity: decr    General Gait Details: Min  assist for steadying. Pt with UE tremors, especially with fatigue, during ambulation. Verbal cuing for pt to take larger steps and increase foot clearance, cuing unsuccessful.   Pt presents with difficulty negotiating obstacles in halls, even with cueing he is unable.     Stairs             Wheelchair Mobility    Modified Rankin (Stroke Patients Only)       Balance Overall balance assessment: Needs assistance Sitting-balance support: Bilateral upper extremity supported;Feet supported Sitting balance-Leahy Scale: Fair     Standing balance support: Bilateral upper extremity supported;During functional activity Standing balance-Leahy Scale: Poor Standing balance comment: reliant on RW and safety                            Cognition Arousal/Alertness: Lethargic Behavior During Therapy: Flat affect Overall Cognitive Status: Impaired/Different from baseline Area of Impairment: Attention;Memory;Following commands;Safety/judgement;Awareness;Problem solving                   Current Attention Level: Sustained Memory: Decreased recall of precautions;Decreased short-term memory Following Commands: Follows one step commands with increased time Safety/Judgement: Decreased awareness of safety;Decreased awareness of deficits Awareness: Intellectual Problem Solving: Slow processing;Decreased initiation;Difficulty sequencing General Comments: Pt drowsy on arrival and will answer but not open eyes. pt cussing at staff and demanding that his back be checked for bug bite. pt with gown partially doff and pulling at gown when staff attempts to (A). pt allowed few minutes then OT returning and pt aroused and able to verbalize name.       Exercises  General Comments General comments (skin integrity, edema, etc.): poor safety awareness and cognition deficits noted      Pertinent Vitals/Pain Pain Assessment: No/denies pain    Home Living Family/patient expects to  be discharged to:: Other (Comment)(brothers home per CM)                    Prior Function Level of Independence: Independent          PT Goals (current goals can now be found in the care plan section) Acute Rehab PT Goals Patient Stated Goal: to drink his dr pepper ( pt does not have dr pepper in the room) Potential to Achieve Goals: Good Progress towards PT goals: Progressing toward goals    Frequency    Min 3X/week      PT Plan Current plan remains appropriate    Co-evaluation PT/OT/SLP Co-Evaluation/Treatment: Yes Reason for Co-Treatment: Complexity of the patient's impairments (multi-system involvement);Necessary to address cognition/behavior during functional activity;To address functional/ADL transfers PT goals addressed during session: Mobility/safety with mobility OT goals addressed during session: ADL's and self-care      AM-PAC PT "6 Clicks" Mobility   Outcome Measure  Help needed turning from your back to your side while in a flat bed without using bedrails?: A Little Help needed moving from lying on your back to sitting on the side of a flat bed without using bedrails?: A Lot Help needed moving to and from a bed to a chair (including a wheelchair)?: A Lot Help needed standing up from a chair using your arms (e.g., wheelchair or bedside chair)?: A Lot Help needed to walk in hospital room?: A Little Help needed climbing 3-5 steps with a railing? : A Lot 6 Click Score: 14    End of Session Equipment Utilized During Treatment: Gait belt Activity Tolerance: Patient limited by fatigue Patient left: in chair;with chair alarm set;with call bell/phone within reach Nurse Communication: Mobility status PT Visit Diagnosis: Other abnormalities of gait and mobility (R26.89);Unsteadiness on feet (R26.81);Difficulty in walking, not elsewhere classified (R26.2);Muscle weakness (generalized) (M62.81)     Time: 8757-9728 PT Time Calculation (min) (ACUTE ONLY): 27  min  Charges:  $Gait Training: 8-22 mins                     Governor Rooks, PTA Acute Rehabilitation Services Pager [phone] Office [phone]     Johnica Armwood Eli Hose 01/17/2019, 4:35 PM

## 2019-01-17 NOTE — Progress Notes (Signed)
Manufacturing engineer [facility]) Palliative Care Program  Received referral from [place] with PMT,  for pt to receive palliative care in the community once discharged.  Pt is to go home with his brother later today.  Attempted to reach brother to set up appointment for palliative care visit, no answer, left message.  [facility] Palliative Care Program will continue to follow in the outpatient setting.  Thank you, Venia Carbon RN, BSN, [place] [facility] Liaison [phone]

## 2019-01-17 NOTE — Progress Notes (Signed)
Occupational Therapy Evaluation Patient Details Name: [name] MRN: [id number] DOB: 1980-11-25 Today's Date: 01/17/2019    History of Present Illness 38 yo male admitted to ED on 4/13 with acute on chronic alcoholic hepatitis, pt's s/s including jaundice, N/V/D. PMH includes alcohol abuse with cirrhosis, cellulitis, HepC, pyogenic arthritis, liver failure, decompression/laminectomy of L4-L5 2018, multiple I&Ds including back and RLE.   Clinical Impression   Pt currently requires min (A) for all transfers due to balance deficits and demonstrates cognitive deficits during familiar adl task. Pt will require 24/7 (A) upon d/c and verbalized need to CM heather with recommendation for [place]. Pt attempting to doff gown without awareness. Pt states "oh I did?"      Follow Up Recommendations  Home health OT;Supervision/Assistance - 24 hour    Equipment Recommendations  Other (comment)(RW)    Recommendations for Other Services       Precautions / Restrictions Precautions Precautions: Fall Restrictions Weight Bearing Restrictions: No      Mobility Bed Mobility Overal bed mobility: Needs Assistance Bed Mobility: Supine to Sit     Supine to sit: Min guard     General bed mobility comments: pt needs cues to balance himself at EOB. pt drowsy and needs incr time to arouse   Transfers Overall transfer level: Needs assistance Equipment used: Rolling walker (2 wheeled) Transfers: Sit to/from Stand Sit to Stand: Min assist         General transfer comment: pt with cues for RW safety and use    Balance Overall balance assessment: Needs assistance Sitting-balance support: Bilateral upper extremity supported;Feet supported Sitting balance-Leahy Scale: Fair     Standing balance support: Bilateral upper extremity supported;During functional activity Standing balance-Leahy Scale: Poor Standing balance comment: reliant on RW and safety                            ADL either performed or assessed with clinical judgement   ADL Overall ADL's : Needs assistance/impaired Eating/Feeding: Set up   Grooming: Minimal assistance;Sitting   Upper Body Bathing: Minimal assistance;Sitting   Lower Body Bathing: Maximal assistance       Lower Body Dressing: Maximal assistance;Sit to/from stand Lower Body Dressing Details (indicate cue type and reason): pt unable to thread feet into underwear due to undershooting. pt with decr ability to lift bil LE to clear floor to don. pt requires (A) to pull underwear all the way up. pt attempting to ambulate with partially don which is a fall risk Toilet Transfer: Moderate assistance;RW Toilet Transfer Details (indicate cue type and reason): pt attempting to static stand at commode and undershooting peeing on floor with max cues for positioning over toilet. pt unaware of peeing on floor. pt with noticeable dark urine and orange tint that RN reports is from medications provided.  Toileting- Clothing Manipulation and Hygiene: Maximal assistance     Tub/Shower Transfer Details (indicate cue type and reason): recommend sponge bath only at this time due to balance Functional mobility during ADLs: Minimal assistance;Rolling walker General ADL Comments: pt walking into door frames with RW. pt abandoning RW in the room at times. pt unable to problem solve how to get RW into narrow spaces. pt decr visual attention to task being completed     Vision         Perception     Praxis      Pertinent Vitals/Pain Pain Assessment: No/denies pain     Hand Dominance Right  Extremity/Trunk Assessment Upper Extremity Assessment Upper Extremity Assessment: Generalized weakness       Cervical / Trunk Assessment Cervical / Trunk Assessment: Normal   Communication Communication Communication: No difficulties   Cognition Arousal/Alertness: Lethargic Behavior During Therapy: Flat affect Overall Cognitive Status:  Impaired/Different from baseline Area of Impairment: Attention;Memory;Following commands;Safety/judgement;Awareness;Problem solving                   Current Attention Level: Sustained Memory: Decreased recall of precautions;Decreased short-term memory Following Commands: Follows one step commands with increased time Safety/Judgement: Decreased awareness of safety;Decreased awareness of deficits Awareness: Intellectual Problem Solving: Slow processing;Decreased initiation;Difficulty sequencing General Comments: Pt drowsy on arrival and will answer but not open eyes. pt cussing at staff and demanding that his back be checked for bug bite. pt with gown partially doff and pulling at gown when staff attempts to (A). pt allowed few minutes then OT returning and pt aroused and able to verbalize name.    General Comments  poor safety awareness and cognition deficits noted    Exercises     Shoulder Instructions      Home Living Family/patient expects to be discharged to:: Other (Comment)(brothers home per CM)                                        Prior Functioning/Environment Level of Independence: Independent                 OT Problem List: Decreased strength;Decreased activity tolerance;Impaired balance (sitting and/or standing);Decreased coordination;Decreased cognition;Decreased safety awareness;Decreased knowledge of use of DME or AE;Decreased knowledge of precautions;Cardiopulmonary status limiting activity      OT Treatment/Interventions: Self-care/ADL training;Therapeutic exercise;Neuromuscular education;Energy conservation;DME and/or AE instruction;Manual therapy;Therapeutic activities;Cognitive remediation/compensation;Patient/family education;Balance training    OT Goals(Current goals can be found in the care plan section) Acute Rehab OT Goals Patient Stated Goal: to drink his dr pepper ( pt does not have dr pepper in the room) OT Goal Formulation:  Patient unable to participate in goal setting Time For Goal Achievement: 01/31/19 Potential to Achieve Goals: Good  OT Frequency: Min 3X/week   Barriers to D/C: Decreased caregiver support(will require 24/7)          Co-evaluation              AM-PAC OT "6 Clicks" Daily Activity     Outcome Measure Help from another person eating meals?: A Little Help from another person taking care of personal grooming?: A Little Help from another person toileting, which includes using toliet, bedpan, or urinal?: A Lot Help from another person bathing (including washing, rinsing, drying)?: A Lot Help from another person to put on and taking off regular upper body clothing?: A Little Help from another person to put on and taking off regular lower body clothing?: A Lot 6 Click Score: 15   End of Session Equipment Utilized During Treatment: Gait belt;Rolling walker Nurse Communication: Mobility status;Precautions  Activity Tolerance: Patient tolerated treatment well Patient left: Other (comment)(PTA Aimee ambulating)  OT Visit Diagnosis: Unsteadiness on feet (R26.81);Muscle weakness (generalized) (M62.81)                Time: 2595-6387 OT Time Calculation (min): 23 min Charges:  OT General Charges $OT Visit: 1 Visit OT Evaluation $OT Eval Moderate Complexity: 1 Mod   Jeri Modena, OTR/L  Acute Rehabilitation Services Pager: [phone] Office: [phone] .   Jeri Modena  01/17/2019, 1:50 PM

## 2019-01-17 NOTE — Progress Notes (Signed)
Family Medicine Teaching Service Daily Progress Note Intern Pager: [phone]  Patient name: [name] Medical record number: [id number] Date of birth: 01/13/1981 Age: 38 y.o. Gender: male  Primary Care Provider: Nicholes Rough, PA-C Consultants: GI Code Status: Full  Pt Overview and Major Events to Date:  4/14 - admit for acute hepatitis 4/16 - CIWA 25, pt refused librium, PCCM consulted for precedex 4/17 - off precedex 4/18 - transferred out of ICU  Assessment and Plan: [name] is a 38 y.o. male presented with jaundice 2/2 acute on chronic alcoholic hepatitis. PMH is significant for hepatitis C s/p Mavyret therapy, alcoholism, liver cirrhosis, alcoholic induced pancreatitis, and thrombocytopenia.   Liver failure 2/2 Acute on chronic hepatitis (h/o alcoholism, hepatitis C s/p treatment, fatty liver). MELD score with poor prognosis and limited improvement on steroids. Not a transplant candidate.  - continue prednisolone (4/14-) per GI recs - Zofran PRN - Daily CMP, CBC, PT/INR - continue lactulose  - awaiting palliative recs  Alcohol withdrawal with DTs. Last drink 01/09/19. Off precedex, transferred out of ICU 4/18. S/p librium, off 4/20. Pt was disoriented overnight, got one dose ativan.  - monitor on CIWA with prn Ativan - continue folate, thiamine, multivitamin  History of hep C. Treated. - hep C quant neg  Thrombocytopenia, chronic -Monitor daily CBC and INR -SCDs  ?OSA Noted desats while sleeping, snoring. No reported apnea. - CPAP qhs  FEN/GI: regular PPx: SCDs  Disposition: pending placement options and stable off ativan 24 hours  Subjective:  Extensive discussion with brother regarding care and prognosis. GI to see.    Objective: Temp:  [98.7 F (37.1 C)-98.8 F (37.1 C)] 98.8 F (37.1 C) (04/21 0615) Pulse Rate:  [66-74] 74 (04/21 0615) Resp:  [16-22] 16 (04/21 0615) BP: (102-119)/(63-77) 102/63 (04/21 0615) SpO2:  [94 %-97 %] 94 %  (04/21 0615) Weight:  [568 kg] 123 kg (04/20 0813)  Physical Exam: General: Mitts in place. Significantly jaundiced throughout whole body. Somnolent but arousable, oriented x3.  HEENT: Significant scleral icterus  Cardio: Regular rhythm, normal S1 and S2.  Pulm: CTAB, snoring. Normal WOB on RA. Abdomen: soft, nontender. +BS. Extremities: WWP, no LE edema Neuro: somnolent, oriented x3.   Laboratory: Recent Labs  Lab 01/14/19 0330 01/15/19 0752 01/17/19 0124  WBC 8.9 9.0 8.8  HGB 11.7* 13.2 12.9*  HCT 35.0* 39.2 37.5*  PLT 62* 62* 65*   INR 2.9  Recent Labs  Lab 01/15/19 0233 01/16/19 0319 01/17/19 0124  NA 143 143 141  K 3.4* 3.7 3.6  CL 106 104 104  CO2 26 28 27   BUN 16 15 16   CREATININE 0.97 0.86 0.86  CALCIUM 8.7* 8.9 8.7*  PROT 6.8 7.2 6.3*  BILITOT 37.4* 39.1* 37.6*  ALKPHOS 100 97 86  ALT 77* 85* 78*  AST 182* 161* 135*  GLUCOSE 130* 102* 121*    Imaging/Diagnostic Tests: No results found.  Sela Hilding, MD 01/17/2019, 7:45 AM PGY-3, [place] Intern pager: [phone], text pages welcome

## 2019-01-17 NOTE — Telephone Encounter (Signed)
-----   Message from Alfredia Ferguson, PA-C sent at 01/17/2019  4:31 PM EDT ----- Regarding: virtual visit and labs Pt being discharged today from [facility] - acute severe ETOH hepatitis- home on prednisone 40 mg po daily x 3 weeks   Please arrange for virtual visit with Dr Hilarie Fredrickson Tuesday or wed next week, and will need CBC, CMET ,INR, ammonia  day before visit. He will be living with his brother. thanks

## 2019-01-17 NOTE — Progress Notes (Signed)
Patient ID: [name], male   DOB: 1981/07/20, 38 y.o.   MRN: [id number]    Progress Note   Subjective  Discussed case with Dr. Lindell Noe, primary service hoping to discharge today.  Plan is for patient to live with his brother has been very involved with his care since admission.  Skilled nursing facility options were pursued and not available, no inpatient EtOH rehab options at this point as patient would be deemed medically unfit.  Nurse reports that he did well yesterday is able to ambulate with a walker though unsteady.  He did have one episode of confusion during the night last night  Tbili 37.6 plts 65  INR 2.6  Current MELD = 31  DF score today=95   Objective   Vital signs in last 24 hours: Temp:  [98.7 F (37.1 C)-98.8 F (37.1 C)] 98.8 F (37.1 C) (04/21 0615) Pulse Rate:  [66-74] 74 (04/21 0615) Resp:  [16-22] 16 (04/21 0615) BP: (102-119)/(63-77) 102/63 (04/21 0615) SpO2:  [94 %-97 %] 94 % (04/21 0615) Last BM Date: 01/16/19 General:   Deeply jaundiced white male in NAD, sleeping soundly did not arouse to voice Heart:  Regular rate and rhythm; no murmurs Lungs: Respirations even and unlabored, lungs CTA bilaterally Abdomen:  Soft, obese nondistended nontender, normal bowel sounds. Extremities:  Without edema. Neurologic:  Alert and oriented,  grossly normal neurologically, when awake Psych:  Cooperative. Normal mood and affect.  Intake/Output from previous day: 04/20 0701 - 04/21 0700 In: 710 [P.O.:710] Out: 1025 [Urine:1025] Intake/Output this shift: Total I/O In: 120 [P.O.:120] Out: -   Lab Results: Recent Labs    01/15/19 0752 01/17/19 0124  WBC 9.0 8.8  HGB 13.2 12.9*  HCT 39.2 37.5*  PLT 62* 65*   BMET Recent Labs    01/15/19 0233 01/16/19 0319 01/17/19 0124  NA 143 143 141  K 3.4* 3.7 3.6  CL 106 104 104  CO2 26 28 27   GLUCOSE 130* 102* 121*  BUN 16 15 16   CREATININE 0.97 0.86 0.86  CALCIUM 8.7* 8.9 8.7*   LFT Recent  Labs    01/17/19 0124  PROT 6.3*  ALBUMIN 2.0*  AST 135*  ALT 78*  ALKPHOS 86  BILITOT 37.6*   PT/INR Recent Labs    01/15/19 0752 01/17/19 0124  LABPROT 27.5* 27.8*  INR 2.6* 2.6*      Assessment / Plan:    #63 38 year old white male alcoholic with very severe acute alcoholic hepatitis superimposed on decompensated cirrhosis secondary to EtOH.  Parameters have been very worrisome for potential for fulminant hepatic failure. Has finally plateaued as has bilirubin.  Plan to continue prednisolone 40  milligrams p.o. every morning for at least 1 month He will need to be reevaluated as an outpatient and if showing continued improvement at times the course can be extended out in tapered doses for a couple more weeks  #2 early hepatorenal syndrome-resolved #3 alcohol withdrawal/delirium tremens-resolved #4 intermittent nocturnal confusion-this may be secondary to effects of EtOH withdrawal and/or hepatic encephalopathy   Plan; Though patient remains quite ill agree we are not providing any local therapy here cannot be accomplished at home. Primary goal will be to keep him away from all alcohol and keep him in a supervised setting at all times over the next couple of months.  He is not well enough to care for himself at this point.  Please discharged on prednisone 40 mg p.o. daily x21 days Continue lactulose 30 cc p.o.  twice daily Continue Protonix 40 mg p.o. every morning Okay to continue Librium over the next few weeks Patient will need to have labs done early next week, and follow-up can be with the medicine clinic. Prognosis is guarded.  GI available to help as needed.  We will speak with his mother today as requested.          Active Problems:   Alcohol withdrawal syndrome with complication (HCC)   Alcoholic hepatitis   Alcohol withdrawal ([place])   AKI (acute kidney injury) ([place])     LOS: 7 days   [name] [name]  01/17/2019, 10:57 AM

## 2019-01-17 NOTE — Discharge Instructions (Signed)
Scottie has liver failure, and he needs to follow up with his regular GI doctor. They can discuss whether he would be able to have transplant if he is able to remain sober.  The GI team today will weigh in on his outpatient medications and this will be in his medication list. He should avoid all alcohol.

## 2019-01-17 NOTE — Progress Notes (Signed)
Palliative:  I met again today with [name]. He is standing to get from chair to bed. He is weak but able to transfer independently. We spoke further about his declining health and progressing liver cirrhosis. He continues to tell me how overwhelmed he is with the information and the decisions he has been faced with. I recommended outpatient palliative to follow and he agrees this would be helpful. He just wants to go home. He gives me permission to speak with his brother and mother. Emotional support provided.   I called mother but no answer. I called brother [name] and discussed plans and is very interested in having palliative to continue the conversation. He confirms plan for brother to come to his home upon discharge. He inquires about process to apply for disability. All questions/concerns addressed.   Exam: Alert, oriented. Jaundice. No distress.   Plan: - Outpatient palliative referral to continue [place] conversations.   25 min  Vinie Sill, NP Palliative Medicine Team Pager # [phone] (M-F 8a-5p) Team Phone # [phone] (Nights/Weekends)

## 2019-01-17 NOTE — Progress Notes (Signed)
Provided discharge education to Pt and brother, [name], all questions and concerns addressed, Pt not in distress, discharged home with belongings.

## 2019-01-17 NOTE — Plan of Care (Signed)

## 2019-01-17 NOTE — Social Work (Signed)
Received an [employer] from [employer], pt family requesting information about applying for disability. CSW does not facilitate disability applications but provided pt with packet including how to apply, what documentation to bring, as well as specific information for the [place] office. This will go with pt at d/c.  Westley Hummer, MSW, [place] [employer] [phone]

## 2019-01-18 ENCOUNTER — Inpatient Hospital Stay (HOSPITAL_COMMUNITY)
Admission: EM | Admit: 2019-01-18 | Discharge: 2019-01-21 | DRG: 689 | Disposition: A | Discharge status: Home Health | Payer: No Typology Code available for payment source | Attending: Family Medicine | Admitting: Family Medicine

## 2019-01-18 ENCOUNTER — Telehealth: Payer: Self-pay

## 2019-01-18 ENCOUNTER — Encounter (HOSPITAL_COMMUNITY): Payer: Self-pay | Admitting: Emergency Medicine

## 2019-01-18 ENCOUNTER — Other Ambulatory Visit: Payer: Self-pay

## 2019-01-18 DIAGNOSIS — E876 Hypokalemia: Secondary | ICD-10-CM | POA: Diagnosis present

## 2019-01-18 DIAGNOSIS — Z87891 Personal history of nicotine dependence: Secondary | ICD-10-CM

## 2019-01-18 DIAGNOSIS — E669 Obesity, unspecified: Secondary | ICD-10-CM | POA: Diagnosis present

## 2019-01-18 DIAGNOSIS — K760 Fatty (change of) liver, not elsewhere classified: Secondary | ICD-10-CM | POA: Diagnosis present

## 2019-01-18 DIAGNOSIS — K7041 Alcoholic hepatic failure with coma: Secondary | ICD-10-CM | POA: Diagnosis present

## 2019-01-18 DIAGNOSIS — K7010 Alcoholic hepatitis without ascites: Secondary | ICD-10-CM | POA: Diagnosis present

## 2019-01-18 DIAGNOSIS — K7290 Hepatic failure, unspecified without coma: Secondary | ICD-10-CM | POA: Diagnosis present

## 2019-01-18 DIAGNOSIS — R278 Other lack of coordination: Secondary | ICD-10-CM | POA: Diagnosis present

## 2019-01-18 DIAGNOSIS — R270 Ataxia, unspecified: Secondary | ICD-10-CM | POA: Diagnosis present

## 2019-01-18 DIAGNOSIS — R32 Unspecified urinary incontinence: Secondary | ICD-10-CM | POA: Diagnosis present

## 2019-01-18 DIAGNOSIS — R531 Weakness: Secondary | ICD-10-CM | POA: Diagnosis not present

## 2019-01-18 DIAGNOSIS — K7682 Hepatic encephalopathy: Secondary | ICD-10-CM | POA: Diagnosis present

## 2019-01-18 DIAGNOSIS — R197 Diarrhea, unspecified: Secondary | ICD-10-CM

## 2019-01-18 DIAGNOSIS — R7881 Bacteremia: Secondary | ICD-10-CM | POA: Diagnosis present

## 2019-01-18 DIAGNOSIS — K766 Portal hypertension: Secondary | ICD-10-CM | POA: Diagnosis present

## 2019-01-18 DIAGNOSIS — N390 Urinary tract infection, site not specified: Principal | ICD-10-CM | POA: Diagnosis present

## 2019-01-18 DIAGNOSIS — I8510 Secondary esophageal varices without bleeding: Secondary | ICD-10-CM | POA: Diagnosis present

## 2019-01-18 DIAGNOSIS — D696 Thrombocytopenia, unspecified: Secondary | ICD-10-CM | POA: Diagnosis not present

## 2019-01-18 DIAGNOSIS — Z79899 Other long term (current) drug therapy: Secondary | ICD-10-CM

## 2019-01-18 DIAGNOSIS — R5381 Other malaise: Secondary | ICD-10-CM | POA: Diagnosis present

## 2019-01-18 DIAGNOSIS — B9620 Unspecified Escherichia coli [E. coli] as the cause of diseases classified elsewhere: Secondary | ICD-10-CM | POA: Diagnosis present

## 2019-01-18 DIAGNOSIS — E46 Unspecified protein-calorie malnutrition: Secondary | ICD-10-CM | POA: Diagnosis present

## 2019-01-18 DIAGNOSIS — K7030 Alcoholic cirrhosis of liver without ascites: Secondary | ICD-10-CM | POA: Diagnosis present

## 2019-01-18 DIAGNOSIS — R112 Nausea with vomiting, unspecified: Secondary | ICD-10-CM | POA: Diagnosis present

## 2019-01-18 DIAGNOSIS — Z8679 Personal history of other diseases of the circulatory system: Secondary | ICD-10-CM | POA: Diagnosis not present

## 2019-01-18 DIAGNOSIS — F1020 Alcohol dependence, uncomplicated: Secondary | ICD-10-CM | POA: Diagnosis present

## 2019-01-18 DIAGNOSIS — K7201 Acute and subacute hepatic failure with coma: Secondary | ICD-10-CM

## 2019-01-18 DIAGNOSIS — Z6837 Body mass index (BMI) 37.0-37.9, adult: Secondary | ICD-10-CM

## 2019-01-18 DIAGNOSIS — R4182 Altered mental status, unspecified: Secondary | ICD-10-CM

## 2019-01-18 DIAGNOSIS — Z886 Allergy status to analgesic agent status: Secondary | ICD-10-CM

## 2019-01-18 DIAGNOSIS — Z7289 Other problems related to lifestyle: Secondary | ICD-10-CM

## 2019-01-18 LAB — CBC WITH DIFFERENTIAL/PLATELET
Abs Immature Granulocytes: 0.13 10*3/uL — ABNORMAL HIGH (ref 0.00–0.07)
Basophils Absolute: 0.1 10*3/uL (ref 0.0–0.1)
Basophils Relative: 1 %
Eosinophils Absolute: 0.1 10*3/uL (ref 0.0–0.5)
Eosinophils Relative: 1 %
HCT: 40.8 % (ref 39.0–52.0)
Hemoglobin: 13.6 g/dL (ref 13.0–17.0)
Immature Granulocytes: 1 %
Lymphocytes Relative: 7 %
Lymphs Abs: 0.7 10*3/uL (ref 0.7–4.0)
MCH: 34 pg (ref 26.0–34.0)
MCHC: 33.3 g/dL (ref 30.0–36.0)
MCV: 102 fL — ABNORMAL HIGH (ref 80.0–100.0)
Monocytes Absolute: 1.3 10*3/uL — ABNORMAL HIGH (ref 0.1–1.0)
Monocytes Relative: 13 %
Neutro Abs: 7.6 10*3/uL (ref 1.7–7.7)
Neutrophils Relative %: 77 %
Platelets: 46 10*3/uL — ABNORMAL LOW (ref 150–400)
RBC: 4 MIL/uL — ABNORMAL LOW (ref 4.22–5.81)
RDW: 19.9 % — ABNORMAL HIGH (ref 11.5–15.5)
WBC: 10 10*3/uL (ref 4.0–10.5)
nRBC: 0 % (ref 0.0–0.2)

## 2019-01-18 LAB — URINALYSIS, ROUTINE W REFLEX MICROSCOPIC
Glucose, UA: NEGATIVE mg/dL
Ketones, ur: NEGATIVE mg/dL
Nitrite: POSITIVE — AB
Protein, ur: NEGATIVE mg/dL
Specific Gravity, Urine: 1.012 (ref 1.005–1.030)
pH: 7 (ref 5.0–8.0)

## 2019-01-18 LAB — AMMONIA: Ammonia: 33 umol/L (ref 9–35)

## 2019-01-18 LAB — COMPREHENSIVE METABOLIC PANEL
ALT: 99 U/L — ABNORMAL HIGH (ref 0–44)
AST: 142 U/L — ABNORMAL HIGH (ref 15–41)
Albumin: 2.1 g/dL — ABNORMAL LOW (ref 3.5–5.0)
Alkaline Phosphatase: 86 U/L (ref 38–126)
Anion gap: 10 (ref 5–15)
BUN: 16 mg/dL (ref 6–20)
CO2: 27 mmol/L (ref 22–32)
Calcium: 9 mg/dL (ref 8.9–10.3)
Chloride: 102 mmol/L (ref 98–111)
Creatinine, Ser: 0.88 mg/dL (ref 0.61–1.24)
GFR calc Af Amer: >60 mL/min (ref >60)
GFR calc non Af Amer: >60 mL/min (ref >60)
Glucose, Bld: 120 mg/dL — ABNORMAL HIGH (ref 70–99)
Potassium: 3.3 mmol/L — ABNORMAL LOW (ref 3.5–5.1)
Sodium: 139 mmol/L (ref 135–145)
Total Bilirubin: 39.3 mg/dL (ref 0.3–1.2)
Total Protein: 6.7 g/dL (ref 6.5–8.1)

## 2019-01-18 LAB — LACTIC ACID, PLASMA: Lactic Acid, Venous: 1.3 mmol/L (ref 0.5–1.9)

## 2019-01-18 LAB — LIPASE, BLOOD: Lipase: 143 U/L — ABNORMAL HIGH (ref 11–51)

## 2019-01-18 MED ORDER — ONDANSETRON HCL 4 MG/2ML IJ SOLN
4.0000 mg | Freq: Once | INTRAMUSCULAR | Status: AC
  Administered 2019-01-18: 4 mg via INTRAVENOUS
  Filled 2019-01-18: qty 2

## 2019-01-18 MED ORDER — SODIUM CHLORIDE 0.9 % IV BOLUS
1000.0000 mL | Freq: Once | INTRAVENOUS | Status: AC
  Administered 2019-01-18: 1000 mL via INTRAVENOUS

## 2019-01-18 NOTE — Telephone Encounter (Signed)
Pt with recent acute alcoholic hepatitis with severely decompensated liver disease. Agree with return to the ER. While his overall prognosis is poor, there is a possibility for recover from acute alc hepatitis.  Most recently his renal function has improved and his INR has downtrended, both positive signs. I am not convinced that hospice and pure palliative care for this 38 yo male is the only option.   We do not know much about his liver disease before this presentation and thus, only time will tell regarding recovery from acute alcoholic hepatitis. Agree he is not a transplant candidate, but that does not leave hospice as the only medical option When he is readmitted the GI consult team will be available to help in management

## 2019-01-18 NOTE — ED Notes (Signed)
ED TO INPATIENT HANDOFF REPORT  ED Nurse Name and Phone #: Ginny Forth [phone]  S Name/Age/Gender Estill Cotta Heiner 38 y.o. male Room/Bed: 034C/034C  Code Status   Code Status: Prior  Home/[name]/Other Home Patient oriented to: self, place, time and situation Is this baseline? Yes   Triage Complete: Triage complete  Chief Complaint confusion/off balance/uncontrollable bladder  Triage Note Pt arrives to ED from home with complaints of weakness and balance issues. Pt reports he was discharged yesterday from the hospital and he thinks he shouldn't have left. Pt jaundice on assessment.    Allergies Allergies  Allergen Reactions  . Acetaminophen Other (See Comments)    Due to liver issues    Level of Care/Admitting Diagnosis ED Disposition    ED Disposition Condition [facility] Area: [place] [[id number]]  Level of Care: Med-Surg [16]  Covid Evaluation: N/A  Diagnosis: Weakness [[id number]]  Admitting Physician: SHIRLEY, [place] [[id number]]  Attending Physician: MCDIARMID, TODD D [[id number]]  Estimated length of stay: past midnight tomorrow  Certification:: I certify this patient will need inpatient services for at least 2 midnights  PT Class (Do Not Modify): Inpatient [101]  PT Acc Code (Do Not Modify): Private [1]       B Medical/Surgery History Past Medical History:  Diagnosis Date  . Alcoholic cirrhosis ([place]) 71/0626   confirmed by liver biopsy.    . Cellulitis 10/2016   right leg  . 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 ETOH abuse   . Hepatitis C 2015   Hepatitis C. Rx Solvaldi/Ribaviran in 2015, undetectable Hep C 03/2015 (see Care Everywhere) but did not have sustained response. Began 12 weeks Mayvret 10/2017 [facility] [place], [place] PA-C  . Hepatitis, alcoholic 94/8546   treated with Prednisolone.    . Pancreatitis, alcoholic, acute 27/0350  . Paraspinal abscess ([place])   . Thrombocytopenia ([place])  25/2018   Past Surgical History:  Procedure Laterality Date  . DECOMPRESSIVE LUMBAR LAMINECTOMY LEVEL 1 N/A 11/01/2016   Procedure: DECOMPRESSIVE POSTERIOR LUMBAR LAMINECTOMY LEVEL 4-5;  Surgeon: Melina Schools, MD;  Location: [facility];  Service: Orthopedics;  Laterality: N/A;  . ESOPHAGOGASTRODUODENOSCOPY  09/38/1829   severe ulcertive esophagitis, retching related gastropathy, small HH.  no varices or portal hypertensive gastropathy.  Dr Lucienne Capers at [facility] specialists in [place]  . ESOPHAGOGASTRODUODENOSCOPY  05/2014   Dr Rolan Lipa.  esophageal erythema (no Barretts on path), small HH, gastritis, duodenitis.    Marland Kitchen HERNIA REPAIR Left    INGUNIAL  . I&D EXTREMITY Right 11/01/2016   Procedure: IRRIGATION AND DEBRIDEMENT ABSCESS RIGHT THIGH;  Surgeon: Melina Schools, MD;  Location: [facility];  Service: Orthopedics;  Laterality: Right;  . INCISION AND DRAINAGE OF WOUND N/A 01/06/2017   Procedure: IRRIGATION AND DEBRIDEMENT and placement of wound vac;  Surgeon: Melina Schools, MD;  Location: [place];  Service: Orthopedics;  Laterality: N/A;  . INCISION AND DRAINAGE OF WOUND N/A 12/02/2016   Procedure: WASHOUT AND CLOSURE OF WOUND;  Surgeon: Melina Schools, MD;  Location: [place];  Service: Orthopedics;  Laterality: N/A;  . LUMBAR WOUND DEBRIDEMENT N/A 11/12/2016   Procedure: LUMBAR WOUND DEBRIDEMENT;  Surgeon: Melina Schools, MD;  Location: [place];  Service: Orthopedics;  Laterality: N/A;     A IV Location/Drains/Wounds Patient Lines/Drains/Airways Status   Active Line/Drains/Airways    Name:   Placement date:   Placement time:   Site:   Days:   Peripheral IV 01/18/19 Right Hand  01/18/19    1807    Hand   less than 1   Closed System Drain Medial Back Other (Comment)   11/12/16    0900    Back   797   Closed System Drain 1 Back Other (Comment) 7 Fr.   12/02/16    1628    Back   777   Negative Pressure Wound Therapy Back Medial   11/18/16    1256    -   791   Negative Pressure Wound Therapy  Back Posterior   01/06/17    1433    -   742   External Urinary Catheter   01/12/19    1108    -   6   Incision (Closed) 11/12/16 Back Other (Comment)   11/12/16    1135     797   Incision (Closed) 11/12/16 Thigh Right   11/12/16    1252     797   Incision (Closed) 12/02/16 Back   12/02/16    1541     777   Incision (Closed) 01/06/17 Back Other (Comment)   01/06/17    1503     742          Intake/Output Last 24 hours  Intake/Output Summary (Last 24 hours) at 01/18/2019 1939 Last data filed at 01/18/2019 1908 Gross per 24 hour  Intake 1000 ml  Output -  Net 1000 ml    Labs/Imaging Results for orders placed or performed during the hospital encounter of 01/18/19 (from the past 48 hour(s))  CBC with Differential     Status: Abnormal   Collection Time: 01/18/19  5:46 PM  Result Value Ref Range   WBC 10.0 4.0 - 10.5 K/uL   RBC 4.00 (L) 4.22 - 5.81 MIL/uL   Hemoglobin 13.6 13.0 - 17.0 g/dL   HCT 40.8 39.0 - 52.0 %   MCV 102.0 (H) 80.0 - 100.0 fL   MCH 34.0 26.0 - 34.0 pg   MCHC 33.3 30.0 - 36.0 g/dL   RDW 19.9 (H) 11.5 - 15.5 %   Platelets 46 (L) 150 - 400 K/uL    Comment: REPEATED TO VERIFY Immature Platelet Fraction may be clinically indicated, consider ordering this additional test [id number] CONSISTENT WITH PREVIOUS RESULT    nRBC 0.0 0.0 - 0.2 %   Neutrophils Relative % 77 %   Neutro Abs 7.6 1.7 - 7.7 K/uL   Lymphocytes Relative 7 %   Lymphs Abs 0.7 0.7 - 4.0 K/uL   Monocytes Relative 13 %   Monocytes Absolute 1.3 (H) 0.1 - 1.0 K/uL   Eosinophils Relative 1 %   Eosinophils Absolute 0.1 0.0 - 0.5 K/uL   Basophils Relative 1 %   Basophils Absolute 0.1 0.0 - 0.1 K/uL   Immature Granulocytes 1 %   Abs Immature Granulocytes 0.13 (H) 0.00 - 0.07 K/uL    Comment: Performed at [facility], [address] [place], [place] [address]  Comprehensive metabolic panel     Status: Abnormal (Preliminary result)   Collection Time: 01/18/19  5:46 PM  Result Value Ref Range    Sodium 139 135 - 145 mmol/L   Potassium 3.3 (L) 3.5 - 5.1 mmol/L   Chloride 102 98 - 111 mmol/L   CO2 27 22 - 32 mmol/L   Glucose, Bld 120 (H) 70 - 99 mg/dL   BUN 16 6 - 20 mg/dL   Creatinine, Ser 0.88 0.61 - 1.24 mg/dL   Calcium 9.0  8.9 - 10.3 mg/dL   Total Protein 6.7 6.5 - 8.1 g/dL   Albumin 2.1 (L) 3.5 - 5.0 g/dL   AST 142 (H) 15 - 41 U/L   ALT 99 (H) 0 - 44 U/L   Alkaline Phosphatase 86 38 - 126 U/L   Total Bilirubin PENDING 0.3 - 1.2 mg/dL   GFR calc non Af Amer >60 >60 mL/min   GFR calc Af Amer >60 >60 mL/min   Anion gap 10 5 - 15    Comment: Performed at [facility] [address], [place], [place] [address]  Lipase, blood     Status: Abnormal   Collection Time: 01/18/19  5:46 PM  Result Value Ref Range   Lipase 143 (H) 11 - 51 U/L    Comment: Performed at [facility], [place] [address], [place], [place] [address]  Lactic acid, plasma     Status: None   Collection Time: 01/18/19  5:46 PM  Result Value Ref Range   Lactic Acid, Venous 1.3 0.5 - 1.9 mmol/L    Comment: Performed at [facility] [address] [place]  [address]   No results found.  Pending Labs Unresulted Labs (From admission, onward)    Start     Ordered   01/18/19 1917  Ethanol  ONCE - STAT,   STAT     01/18/19 1917   01/18/19 1728  Urinalysis, Routine w reflex microscopic  (ED Abdominal Pain)  ONCE - STAT,   STAT     01/18/19 1727   01/18/19 1728  Ethanol  Once,   STAT     01/18/19 1727          Vitals/Pain Today's Vitals   01/18/19 1815 01/18/19 1830 01/18/19 1845 01/18/19 1930  BP: 116/61 111/76 119/69 104/69  Pulse: 77 77 69 80  Resp: 17 12 (!) 21 (!) 23  Temp:      TempSrc:      SpO2: 95% 95% 95% 97%  PainSc:        Isolation Precautions No active isolations  Medications Medications  sodium chloride 0.9 % bolus 1,000 mL (0 mLs Intravenous Stopped 01/18/19 1908)  ondansetron (ZOFRAN) injection 4 mg (4 mg Intravenous Given 01/18/19 1808)     Mobility walks with person assist High fall risk   Focused Assessments n/a   R Recommendations: See Admitting Provider Note  Report given to:   Additional Notes: DCd yesterday.   Warm Hand Off Completed.

## 2019-01-18 NOTE — ED Triage Notes (Signed)
Pt arrives to ED from home with complaints of weakness and balance issues. Pt reports he was discharged yesterday from the hospital and he thinks he shouldn't have left. Pt jaundice on assessment.

## 2019-01-18 NOTE — Telephone Encounter (Signed)
Patient contacted for Palliative Care visit.  Due to the current COVID-19 infection/crises, the patient and family prefer, and have given their verbal consent for, a provider visit via telemedicine. HIPPA policies of confidentially were discussed and patient/family expressed understanding. Visit scheduled with [facility] [address] NP for 01/19/2019

## 2019-01-18 NOTE — ED Provider Notes (Signed)
[facility] EMERGENCY DEPARTMENT Provider Note   CSN: [id number] Arrival date & time: 01/18/19  1659    History   Chief Complaint Chief Complaint  Patient presents with  . Weakness    HPI [name] is a 38 y.o. male.     The history is provided by the patient and medical records. No language interpreter was used.  Weakness     38 year old male with known history of alcoholic cirrhosis, Hep C recently hospitalized for severely decompensated liver disease secondary to alcoholic hepatitis, discharged yesterday but returns today with complaints of weakness.  Patient recently admitted to the hospital for very severe acute alcoholic hepatitis superimposed on decompensated cirrhosis secondary to alcohol use.  During the course of the hospitalization, his alcohol withdrawal/DT did resolved.  Patient was discharged home with prednisone, lactulose, Protonix, and Librium.  He mention although he felt better yesterday today he feels much worse.  Endorsed persistent nausea and vomiting as well as having some loose stools.  States that he feels very weak, unable to ambulate and felt unsteady on his feet.  He does not complain of any fever or productive cough.  He does not complain of any significant abdominal pain.  States he does not have much of an appetite.  He does not feel comfortable staying at home.  Denies any recent alcohol use.  He did notice that his skin is much more yellow than before.  Past Medical History:  Diagnosis Date  . Alcoholic cirrhosis ([place]) 59/9357   confirmed by liver biopsy.    . Cellulitis 10/2016   right leg  . 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 ETOH abuse   . Hepatitis C 2015   Hepatitis C. Rx Solvaldi/Ribaviran in 2015, undetectable Hep C 03/2015 (see Care Everywhere) but did not have sustained response. Began 12 weeks Mayvret 10/2017 by [facility] in [place], [place] PA-C  . Hepatitis, alcoholic  09/7791   treated with Prednisolone.    . Pancreatitis, alcoholic, acute 90/3009  . Paraspinal abscess ([place])   . Thrombocytopenia ([place]) 25/2018    Patient Active Problem List   Diagnosis Date Noted  . AKI (acute kidney injury) ([place])   . Alcohol withdrawal ([place]) 01/12/2019  . Alcoholic hepatitis 23/30/0762  . Hyperbilirubinemia   . Acute on chronic alcoholic liver disease ([place])   . Medication monitoring encounter 01/19/2017  . Weakness of right lower extremity 11/19/2016  . 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 Abnormality of gait   . Post-operative pain   . Infective myositis of right thigh   . Coagulopathy ([place])   . Alcohol withdrawal syndrome with complication ([place])   . Transaminitis   . Infection 11/12/2016  . Alcoholic cirrhosis of liver without ascites ([place])   . Goals of care, counseling/discussion   . Palliative care by specialist   . Pyogenic arthritis of multiple sites ([place])   . Traumatic compartment syndrome of other sites   . Alcoholic liver failure ([place])   . Traumatic compartment syndrome of lower extremity ([place])   . Infection of lumbar spine ([place]) 11/01/2016  . Delirium tremens ([place])   . Abnormal transaminases   . Alcoholic hepatitis without ascites   . ETOH abuse   . Abscess of right lower extremity   . Cellulitis and abscess of right lower extremity   . Hypernatremia   . Cellulitis of right lower extremity 10/30/2016  . EtOH dependence ([place]) 10/30/2016  . Thrombocytopenia ([place]) 10/30/2016  . Cirrhosis ([place])   .  Paraspinal abscess ([place])   . Chronic hepatitis C without hepatic coma ([place])   . Hepatitis   . Epidural abscess 10/29/2016  . Liver failure ([place]) 10/29/2016  . Sepsis ([place]) 10/29/2016  . Hyponatremia 10/29/2016    Past Surgical History:  Procedure Laterality Date  . DECOMPRESSIVE LUMBAR LAMINECTOMY LEVEL 1 N/A 11/01/2016   Procedure: DECOMPRESSIVE POSTERIOR LUMBAR LAMINECTOMY LEVEL 4-5;  Surgeon: Melina Schools, MD;  Location: [place];   Service: Orthopedics;  Laterality: N/A;  . ESOPHAGOGASTRODUODENOSCOPY  63/09/6008   severe ulcertive esophagitis, retching related gastropathy, small HH.  no varices or portal hypertensive gastropathy.  Dr Lucienne Capers at [facility] specialists in [place]  . ESOPHAGOGASTRODUODENOSCOPY  05/2014   Dr Rolan Lipa.  esophageal erythema (no Barretts on path), small HH, gastritis, duodenitis.    Marland Kitchen HERNIA REPAIR Left    INGUNIAL  . I&D EXTREMITY Right 11/01/2016   Procedure: IRRIGATION AND DEBRIDEMENT ABSCESS RIGHT THIGH;  Surgeon: Melina Schools, MD;  Location: [place];  Service: Orthopedics;  Laterality: Right;  . INCISION AND DRAINAGE OF WOUND N/A 01/06/2017   Procedure: IRRIGATION AND DEBRIDEMENT and placement of wound vac;  Surgeon: Melina Schools, MD;  Location: [place];  Service: Orthopedics;  Laterality: N/A;  . INCISION AND DRAINAGE OF WOUND N/A 12/02/2016   Procedure: WASHOUT AND CLOSURE OF WOUND;  Surgeon: Melina Schools, MD;  Location: [place];  Service: Orthopedics;  Laterality: N/A;  . LUMBAR WOUND DEBRIDEMENT N/A 11/12/2016   Procedure: LUMBAR WOUND DEBRIDEMENT;  Surgeon: Melina Schools, MD;  Location: [place];  Service: Orthopedics;  Laterality: N/A;        Home Medications    Prior to Admission medications   Medication Sig Start Date End Date Taking? Authorizing Provider  feeding supplement, ENSURE ENLIVE, (ENSURE ENLIVE) LIQD Take 237 mLs by mouth 2 (two) times daily between meals. 01/17/19   Rory Percy, DO  ibuprofen (ADVIL,MOTRIN) 200 MG tablet Take 400 mg by mouth every 6 (six) hours as needed for headache.    [provider]  lactulose (CHRONULAC) 10 GM/15ML solution Take 45 mLs (30 g total) by mouth 2 (two) times daily. 01/17/19   Rory Percy, DO  Multiple Vitamin (MULTIVITAMIN WITH MINERALS) TABS tablet Take 1 tablet by mouth daily. Patient not taking: Reported on 11/10/2017 11/17/16   Elgergawy, Silver Huguenin, MD  pantoprazole (PROTONIX) 40 MG tablet Take 1 tablet  (40 mg total) by mouth every morning. 01/17/19   Rory Percy, DO  predniSONE (DELTASONE) 20 MG tablet Take 2 tablets (40 mg total) by mouth daily with breakfast for 21 days. 01/17/19 02/07/19  Rory Percy, DO  rifaximin (XIFAXAN) 550 MG TABS tablet Take 1 tablet (550 mg total) by mouth 2 (two) times daily for 20 days. 01/17/19 02/06/19  Rory Percy, DO  tetrahydrozoline-zinc (VISINE-AC) 0.05-0.25 % ophthalmic solution Place 2 drops into both eyes daily as needed (itchy eyes).    [provider]    Family History Family History  Problem Relation Age of Onset  . Diabetes Mother     Social History Social History   Tobacco Use  . Smoking status: Former Smoker    Last attempt to quit: 01/04/2017    Years since quitting: 2.0  . Smokeless tobacco: Never Used  . Tobacco comment: was a social smoker- maybe 5 a week  Substance Use Topics  . Alcohol use: Yes    Comment: occasionally  . Drug use: No    Comment: as a teen a few times  Allergies   Acetaminophen   Review of Systems Review of Systems  Neurological: Positive for weakness.  All other systems reviewed and are negative.    Physical Exam Updated Vital Signs BP 122/69   Pulse 77   Temp 98.6 F (37 C) (Oral)   Resp (!) 25   SpO2 94%   Physical Exam Vitals signs and nursing note reviewed.  Constitutional:      General: He is not in acute distress.    Appearance: He is well-developed. He is obese. He is ill-appearing.     Comments: Patient is ill-appearing, drowsy but able to answer question appropriately.  HENT:     Head: Atraumatic.  Eyes:     General: Scleral icterus present.     Conjunctiva/sclera: Conjunctivae normal.  Neck:     Musculoskeletal: Neck supple.  Cardiovascular:     Rate and Rhythm: Normal rate and regular rhythm.     Pulses: Normal pulses.     Heart sounds: Normal heart sounds.  Pulmonary:     Effort: Pulmonary effort is normal.     Breath sounds: Normal breath sounds. No  wheezing or rales.  Abdominal:     Palpations: Abdomen is soft.     Tenderness: There is no abdominal tenderness.  Skin:    Coloration: Skin is jaundiced.     Findings: No rash.     Comments: Significant jaundice appreciated on physical exam  Neurological:     Mental Status: He is oriented to person, place, and time.      ED Treatments / Results  Labs (all labs ordered are listed, but only abnormal results are displayed) Labs Reviewed  CBC WITH DIFFERENTIAL/PLATELET - Abnormal; Notable for the following components:      Result Value   RBC 4.00 (*)    MCV 102.0 (*)    RDW 19.9 (*)    Platelets 46 (*)    Monocytes Absolute 1.3 (*)    Abs Immature Granulocytes 0.13 (*)    All other components within normal limits  COMPREHENSIVE METABOLIC PANEL - Abnormal; Notable for the following components:   Potassium 3.3 (*)    Glucose, Bld 120 (*)    Albumin 2.1 (*)    AST 142 (*)    ALT 99 (*)    All other components within normal limits  LIPASE, BLOOD - Abnormal; Notable for the following components:   Lipase 143 (*)    All other components within normal limits  LACTIC ACID, PLASMA  URINALYSIS, ROUTINE W REFLEX MICROSCOPIC  ETHANOL  ETHANOL    EKG EKG Interpretation  Date/Time:  Wednesday January 18 2019 17:13:27 EDT Ventricular Rate:  77 PR Interval:    QRS Duration: 115 QT Interval:  422 QTC Calculation: 478 R Axis:   -26 Text Interpretation:  Sinus rhythm Nonspecific intraventricular conduction delay Anterior injury pattern No significant change since last tracing Abnormal ekg Confirmed by Carmin Muskrat [phone]) on 01/18/2019 6:39:39 PM   Radiology No results found.  Procedures Procedures (including critical care time)  Medications Ordered in ED Medications  sodium chloride 0.9 % bolus 1,000 mL (1,000 mLs Intravenous New Bag/Given 01/18/19 1808)  ondansetron (ZOFRAN) injection 4 mg (4 mg Intravenous Given 01/18/19 1808)     Initial Impression / Assessment and  Plan / ED Course  I have reviewed the triage vital signs and the nursing notes.  Pertinent labs & imaging results that were available during my care of the patient were reviewed by me and considered in my  medical decision making (see chart for details).        BP 122/69   Pulse 77   Temp 98.6 F (37 C) (Oral)   Resp (!) 25   SpO2 94%    Final Clinical Impressions(s) / ED Diagnoses   Final diagnoses:  Subacute liver failure with hepatic coma (HCC)  Nausea vomiting and diarrhea    ED Discharge Orders    None     5:40 PM Patient with alcoholic hepatitis recently hospitalized for several days (4/14-4/21) and discharged yesterday, will return with worsening of his generalized fatigue, lack of appetite, and associate nausea vomiting diarrhea.  From his notes on epic, gastroenterologist, Dr. Hilarie Fredrickson is aware of his condition and does agree with admission for symptom control and GI will be available to help in management.  6:53 PM Labs showing improvement from prior.  However due to the severity of his condition, couple with persistent nausea/vomiting will consult for admission.   7:29 PM Appreciate consultation from [facility] Medicine Resident who agrees to see and admit pt for further management of his condition.    Domenic Moras, PA-C 01/18/19 1930    Carmin Muskrat, MD 01/19/19 [id number]

## 2019-01-18 NOTE — Telephone Encounter (Signed)
I have spoken to patient's brother, [name] to advise of recommendations. He verbalizes understanding. Patient's girlfriend, [name] may have to bring him for labs on Monday. I have advised [name] that should patient continue with confusion or fail to improve, he should return to hospital. In addition, I have placed xifaxan patient assistance forms on Dr Vena Rua desk for signature and will then send our completed portion to [place] for completion on patient's part to email: [email or web address]

## 2019-01-18 NOTE — Telephone Encounter (Signed)
I have spoken to [place], patient's brother to advise of zoom appointment with Dr Hilarie Fredrickson on 01/26/19 at 1:30 pm for follow up after hospitalization for liver failure/alcoholic hepatitis/cirrhosis. Preston verbalizes understanding of this.   [name] has a couple of concerns: 1. Xifaxan will cost over 2,000 dollars. It appears a script was written for 1 tablet twice daily x 20 days. I do have a small supply of samples that I have placed at our front desk for patient pick up (16.5 days worth) until we can come up with solution.   Dr Hilarie Fredrickson or [name], any reason we are only doing this 20 days or shall I try for patient assistance?  Vass indicates that patient is urinating "everywhere." States he is urinating in hallway, by the couch and in other areas even though he feels patient is trying to make it to the restroom. My thoughts were that once we can get xifaxan and lactulose in patient on a consistent basis that this may help with mentation and hopefully stop this unwanted behavior.  Dr Hilarie Fredrickson or [name] any other thoughts? UTI? Anything of that sort?

## 2019-01-18 NOTE — ED Notes (Signed)
Girlfriend Nipomo

## 2019-01-18 NOTE — Telephone Encounter (Signed)
I have spoken to [name] regarding this patient and she indicates that patient should be on Xifaxan twice daily long term, not for just 20 days. I will go forward with starting patient assistance. Will have patient representative sign on Monday when I have them come for labs.  [name] also indicates she would like patient to take lactulose 3 times daily to help with mentation since it appears he is still having quite a bit of confusion (urinating in various places other than the restroom). She thinks if we can get his confusion under control somewhat, that the urinating in various places should stop.   [name] has stated that [name] obviously should try to aid his brother as much as possible on an outpatient basis but if it becomes evident that his efforts are not working Calan continues to decline, [name] may have to bring [name] back to the hospital.  I have left a voicemail for [place] to call back.

## 2019-01-18 NOTE — H&P (Addendum)
[facility] Admission History and Physical Service Pager: [phone]  Patient name: [name] Medical record number: [id number] Date of birth: [birth year] Age: 38 y.o. Gender: male  Primary Care Provider: Nicholes Rough, PA-C Consultants: IP CONSULT TO FAMILY PRACTICE Code Status: Full Code   Chief Complaint: Weakness   Assessment and Plan: [name] is a 38 y.o. male admitted for weakness in the setting of . Also found to have UTI.  His chronic conditions include  Liver failure; Thrombocytopenia; Cirrhosis; Chronic hepatitis C without hepatic coma;   #Ataxia, weakness, confusion, subacute  Patient's weakness seems subacute as this does not seem far from his status when he was in the hospital.  On 01/09/2019, patient presented with jaundice with recent onset vomiting and diarrhea.  He was admitted for acute alcoholic hepatitis and ultimately remained admitted for alcohol withdrawal. Per progress notes, it appears that patient was experiencing weakness, trembling, likely also related to ETOH withdrawal. It is likely that this continued and worsened at home. On chart review patient appears to have had confusion during last admission, RN note from 4/21 showing patient removing tele box and gown with confusion.  The cause for this patient's symptoms are consistent with encephalopathy secondary to liver failure, but should also consider Warnicke's encephalopathy in the setting of alcohol use. Patient appears to have had alcohol detox during last admission and denies alcohol use at home, ethanol level pending. MELD score 31, 52.6% morality rate in 3 months.. Patient was provided folic acid and thiamine supplements during his last hospitalization, however, there is no record of thiamine and folate levels. Can also consider acute intoxication. Was unable to obtain ETOH levels in hospital lab due to severe bilirubinemia, and thus lab will be sent out. Though  patient and brother both deny ETOH or drug use.  Lastly, can also consider head bleed given thrombocytopenia.  Will obtain CT head to rule out hemorrhage in setting of weakness and falls. CT head negative. Ammonia wnl.   Admit to FMTS, attending Dr. McDiarmid. Level of care: Med-Surg.  . Nursing: OOB w/ assistance, vitals per unit protocol  . F/u fractionated bili  . Follow up thiamine and folic levels, ETOH level, fractionated bili . IV thiamine 500mg  TID x 2 days, then 250mg  once daily x 5 days  . folic acid supplementation  . Continue daily prednisone . ESLD treatment as below.  . Consult GI in AM . Neuro checks q4h . Continue home lactuloase  . Continue home xifaxan  . CT head  # UTI with  incontinence Patient reports urinary incontinence at home. UA showing positive nitrites and trace leukocytes. He denies placement of Foley catheter while in the hospital.  Follow up urine culture  Start Keflex Q6 hours x 7 days    # Liver Failure #Chronic Hep C #ETOH Cirrhosis MELD-Na score: 31 at 01/18/2019  5:46 PM, MELD score: 31 at 01/18/2019  5:46 PM Calculated from: Serum Creatinine: 0.88 mg/dL (Rounded to 1 mg/dL) at 01/18/2019  5:46 PM Serum Sodium: 139 mmol/L (Rounded to 137 mmol/L) at 01/18/2019  5:46 PM Total Bilirubin: 39.3 mg/dL at 01/18/2019  5:46 PM INR(ratio): 2.6 at 01/17/2019  1:24 AM Age: 53 years. 52.6% 3 month mortality. AST and ALT elevated to 129 and 93, respectively. Albumin 1.9. Bilirubin elevated to 36.2 Patient's most recent admission for end-stage liver failure.  Patient was followed by gastroenterology during last admission.  At that time, patient was not a liver transplant candidate due to  continued alcohol intake.  Patient was to follow-up with GI closely.  Consult GI in AM, following closely as outpatient  Continue lactulose and rifaximin, prednisone  Nutrition consulted, appreciate recommendations  CIWA - no ativan ordered  # Thrombocytopenia Platelets 45.  Likely due to his severe end-stage liver failure.  SCDs  #Hypokalemia K of 3.2 -replete with KDUR PRN -follow on daily CMP  #Malnutrition   Nutrition recommendations  #FEN/GI:  . Nutrition:  Low sodium diet  Access: PIV  VTE prophylaxis: SCD's  Disposition: Admit to Med Surg.   History of the Present Illness  [name] is a 38 y.o. 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  Patient reports weakness and falls when he returned home from the hospital yesterday.  Patient notes that he was very weak at home and was unable to make it to the bathroom when he had to urinate.  He had multiple episodes of incontinence at home.  According to patient's brother, prior to his first admission, he was able to walk himself in the hospital, which is his baseline.  Patient's brother reported that when he left the hospital yesterday he was taking small bits baby steps, but was more of a shuffle this morning.  He reports that the weakness was acutely worsened this morning, but not far off from when he left the hospital.  There is a marked difference between his status now and from when he was first admitted on the 13th.  Patient denies any recent nausea vomiting or diarrhea.  Per [name], patient ate well yesterday evening when he arrived home but did not have a good appetite this morning.  He was then refusing to take his medications but did end up taking them at 1130.  Brother reports this included MVI, PTX, lactulose, rifaximin, prednisone.  In the ED, patient's lactate was 1.3, lipase 143, bilirubin 39.3, potassium 3.3, AST 142, ALT 99, white blood cell count 10, MCV 102.0, platelets 46.  He was provided 1 L normal saline bolus.  UA returned with small hemoglobin, moderate bilirubin, nitrite positive, leukocyte trace, many bacteria.  Review Of Systems: Review of Systems  Constitutional:  Positive for malaise/fatigue. Negative for chills and fever.  HENT: Negative for congestion and sore throat.   Eyes: Negative for blurred vision.  Respiratory: Negative for cough and shortness of breath.   Cardiovascular: Negative for chest pain and palpitations.  Gastrointestinal: Negative for abdominal pain, diarrhea, nausea and vomiting.  Genitourinary: Positive for frequency and urgency.  Musculoskeletal: Positive for falls. Negative for myalgias.  Skin: Negative for itching and rash.  Neurological: Positive for weakness. Negative for headaches.  Psychiatric/Behavioral: Negative.    Patient Active Problem List   Diagnosis Date Noted  . Weakness 01/18/2019  . AKI (acute kidney injury) ([place])   . Alcohol withdrawal ([place]) 01/12/2019  . Alcoholic hepatitis 13/04/6577  . Hyperbilirubinemia   . Acute on chronic alcoholic liver disease ([place])   . Medication monitoring encounter 01/19/2017  . Weakness of right lower extremity 11/19/2016  . 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 Abnormality of gait   . Post-operative pain   . Infective myositis of right thigh   . Coagulopathy ([place])   . Alcohol withdrawal syndrome with complication ([place])   . Transaminitis   . Infection 11/12/2016  . Alcoholic cirrhosis of liver without ascites ([place])   . Goals of care, counseling/discussion   . Palliative  care by specialist   . Pyogenic arthritis of multiple sites ([place])   . Traumatic compartment syndrome of other sites   . Alcoholic liver failure ([place])   . Traumatic compartment syndrome of lower extremity ([place])   . Infection of lumbar spine ([place]) 11/01/2016  . Delirium tremens ([place])   . Abnormal transaminases   . Alcoholic hepatitis without ascites   . ETOH abuse   . Abscess of right lower extremity   . Cellulitis and abscess of right lower extremity   . Hypernatremia   . Cellulitis of right lower extremity 10/30/2016  . EtOH dependence ([place]) 10/30/2016  . Thrombocytopenia ([place]) 10/30/2016   . Cirrhosis ([place])   . Paraspinal abscess ([place])   . Chronic hepatitis C without hepatic coma ([place])   . Hepatitis   . Epidural abscess 10/29/2016  . Liver failure ([place]) 10/29/2016  . Sepsis ([place]) 10/29/2016  . Hyponatremia 10/29/2016   Past Medical History: Past Medical History:  Diagnosis Date  . Alcoholic cirrhosis ([place]) 34/9179   confirmed by liver biopsy.    . Cellulitis 10/2016   right leg  . 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 ETOH abuse   . Hepatitis C 2015   Hepatitis C. Rx Solvaldi/Ribaviran in 2015, undetectable Hep C 03/2015 (see Care Everywhere) but did not have sustained response. Began 12 weeks Mayvret 10/2017 by [facility] in [place], [place] PA-C  . Hepatitis, alcoholic 15/0569   treated with Prednisolone.    . Pancreatitis, alcoholic, acute 79/4801  . Paraspinal abscess ([place])   . Thrombocytopenia ([place]) 25/2018    Past Surgical History: Past Surgical History:  Procedure Laterality Date  . DECOMPRESSIVE LUMBAR LAMINECTOMY LEVEL 1 N/A 11/01/2016   Procedure: DECOMPRESSIVE POSTERIOR LUMBAR LAMINECTOMY LEVEL 4-5;  Surgeon: Melina Schools, MD;  Location: [facility];  Service: Orthopedics;  Laterality: N/A;  . ESOPHAGOGASTRODUODENOSCOPY  65/53/7482   severe ulcertive esophagitis, retching related gastropathy, small HH.  no varices or portal hypertensive gastropathy.  Dr Lucienne Capers at [facility] in [place]  . ESOPHAGOGASTRODUODENOSCOPY  05/2014   Dr Rolan Lipa.  esophageal erythema (no Barretts on path), small HH, gastritis, duodenitis.    Marland Kitchen HERNIA REPAIR Left    INGUNIAL  . I&D EXTREMITY Right 11/01/2016   Procedure: IRRIGATION AND DEBRIDEMENT ABSCESS RIGHT THIGH;  Surgeon: Melina Schools, MD;  Location: [facility];  Service: Orthopedics;  Laterality: Right;  . INCISION AND DRAINAGE OF WOUND N/A 01/06/2017   Procedure: IRRIGATION AND DEBRIDEMENT and placement of wound vac;  Surgeon: Melina Schools, MD;  Location: [facility];   Service: Orthopedics;  Laterality: N/A;  . INCISION AND DRAINAGE OF WOUND N/A 12/02/2016   Procedure: WASHOUT AND CLOSURE OF WOUND;  Surgeon: Melina Schools, MD;  Location: [facility];  Service: Orthopedics;  Laterality: N/A;  . LUMBAR WOUND DEBRIDEMENT N/A 11/12/2016   Procedure: LUMBAR WOUND DEBRIDEMENT;  Surgeon: Melina Schools, MD;  Location: [facility];  Service: Orthopedics;  Laterality: N/A;    Family History: family history includes Diabetes in his mother.   Social History: Social History   Social History Narrative   Patient lives with his girlfriend in [place].     Ez[place]ias reports that he quit smoking about 2 years ago. He has never used smokeless tobacco. He reports current alcohol use. He reports that he does not use drugs.  Allergies and Medications: Allergies  Allergen Reactions  . Acetaminophen Other (See Comments)    Due to liver issues   No outpatient medications have been marked  as taking for the 01/18/19 encounter [facility] Encounter).    Objective: BP 116/69 (BP Location: Left Arm)   Pulse 66   Temp 98.2 F (36.8 C) (Oral)   Resp 20   Ht 5\' 11"  (1.803 m)   SpO2 95%   BMI 37.82 kg/m   Exam: Physical Exam Constitutional:      Appearance: He is obese.     Comments: Jaundiced  HENT:     Head: Normocephalic and atraumatic.     Nose: Nose normal.  Eyes:     General: Scleral icterus present.     Pupils: Pupils are equal, round, and reactive to light.  Neck:     Musculoskeletal: Normal range of motion and neck supple. Muscular tenderness present.  Cardiovascular:     Rate and Rhythm: Normal rate and regular rhythm.  Pulmonary:     Effort: Pulmonary effort is normal.     Breath sounds: Normal breath sounds.  Abdominal:     General: Bowel sounds are normal.     Tenderness: There is no guarding or rebound.  Musculoskeletal:        General: No swelling or tenderness.     Right lower leg: No edema.     Left lower leg: No edema.  Lymphadenopathy:     Cervical:  No cervical adenopathy.  Skin:    General: Skin is warm and dry.     Coloration: Skin is jaundiced.     Findings: No rash.  Neurological:     General: No focal deficit present.     Mental Status: He is alert and oriented to person, place, and time. He is confused.     Motor: Weakness present.     Coordination: Finger-Nose-Finger Test normal.     Comments: Unable to perform serial 7's or spell WORLD backwards      Labs and Imaging: I have personally reviewed following labs and imaging studies CBC: Recent Labs  Lab 01/13/19 0403 01/14/19 0330 01/15/19 0752 01/17/19 0124 01/18/19 1746  WBC 8.9 8.9 9.0 8.8 10.0  NEUTROABS  --   --   --   --  7.6  HGB 11.7* 11.7* 13.2 12.9* 13.6  HCT 34.7* 35.0* 39.2 37.5* 40.8  MCV 99.1 99.2 99.2 100.0 102.0*  PLT 53* 62* 62* 65* 46*   Basic Metabolic Panel: Recent Labs  Lab 01/12/19 0529  01/13/19 0403  01/14/19 0330 01/15/19 0233 01/16/19 0319 01/17/19 0124 01/18/19 1746  NA 137   < > 139   < > 145 143 143 141 139  K 3.2*   < > 3.7   < > 3.5 3.4* 3.7 3.6 3.3*  CL 99  --  106   < > 111 106 104 104 102  CO2 25  --  25   < > 23 26 28 27 27   GLUCOSE 86  --  106*   < > 152* 130* 102* 121* 120*  BUN 9  --  24*   < > 21* 16 15 16 16   CREATININE 0.76  --  2.45*   < > 1.44* 0.97 0.86 0.86 0.88  CALCIUM 8.9  --  8.0*   < > 8.2* 8.7* 8.9 8.7* 9.0  MG 1.9  --  1.8  --  2.5*  --   --   --   --   PHOS  --   --  6.8*  --   --   --   --   --   --  ALBUMIN 2.1*  --  1.9*   < > 2.3* 2.2* 2.1* 2.0* 2.1*   < > = values in this interval not displayed.   GFR: Estimated Creatinine Clearance: 152 mL/min (by C-G formula based on SCr of 0.88 mg/dL).   Liver Function Tests: Recent Labs  Lab 01/14/19 0330 01/15/19 0233 01/16/19 0319 01/17/19 0124 01/18/19 1746  AST 195* 182* 161* 135* 142*  ALT 73* 77* 85* 78* 99*  ALKPHOS 95 100 97 86 86  BILITOT 36.0* 37.4* 39.1* 37.6* 39.3*  PROT 6.2* 6.8 7.2 6.3* 6.7  ALBUMIN 2.3* 2.2* 2.1* 2.0* 2.1*    Recent Labs  Lab 01/15/19 0233 01/18/19 1746  LIPASE 298* 143*   Recent Labs  Lab 01/13/19 1127 01/18/19 2141  AMMONIA 87* 33   Coagulation Profile: Recent Labs  Lab 01/13/19 0403 01/13/19 1417 01/14/19 0330 01/15/19 0752 01/17/19 0124  INR 3.0* 2.7* 2.7* 2.6* 2.6*   Urine analysis:    Component Value Date/Time   COLORURINE AMBER (A) 01/18/2019 1948   APPEARANCEUR HAZY (A) 01/18/2019 1948   LABSPEC 1.012 01/18/2019 1948   PHURINE 7.0 01/18/2019 1948   GLUCOSEU NEGATIVE 01/18/2019 1948   HGBUR SMALL (A) 01/18/2019 1948   BILIRUBINUR MODERATE (A) 01/18/2019 1948   KETONESUR NEGATIVE 01/18/2019 1948   PROTEINUR NEGATIVE 01/18/2019 1948   NITRITE POSITIVE (A) 01/18/2019 1948   LEUKOCYTESUR TRACE (A) 01/18/2019 1948   Imaging/Diagnostic Tests: No results found.  EKG Interpretation  Date/Time:  Wednesday January 18 2019 17:13:27 EDT Ventricular Rate:  77 PR Interval:    QRS Duration: 115 QT Interval:  422 QTC Calculation: 478 R Axis:   -26 Text Interpretation:  Sinus rhythm Nonspecific intraventricular conduction delay Anterior injury pattern No significant change since last tracing Abnormal ekg Confirmed by Carmin Muskrat [phone]) on 01/18/2019 6:39:39 PM       Ct Head Wo Contrast  Result Date: 01/19/2019 CLINICAL DATA:  Weakness and confusion EXAM: CT HEAD WITHOUT CONTRAST TECHNIQUE: Contiguous axial images were obtained from the base of the skull through the vertex without intravenous contrast. COMPARISON:  None. FINDINGS: Brain: There is no mass, hemorrhage or extra-axial collection. The size and configuration of the ventricles and extra-axial CSF spaces are normal. The brain parenchyma is normal, without acute or chronic infarction. Vascular: No abnormal hyperdensity of the major intracranial arteries or dural venous sinuses. No intracranial atherosclerosis. Probable right frontal DVA. Skull: The visualized skull base, calvarium and extracranial soft tissues are  normal. Sinuses/Orbits: No fluid levels or advanced mucosal thickening of the visualized paranasal sinuses. No mastoid or middle ear effusion. The orbits are normal. IMPRESSION: Normal brain. Electronically Signed   By: Ulyses Jarred M.D.   On: 01/19/2019 02:35   US Abdomen Limited Ruq  Result Date: 01/10/2019 CLINICAL DATA:  Hyperbilirubinemia EXAM: ULTRASOUND ABDOMEN LIMITED RIGHT UPPER QUADRANT COMPARISON:  None. FINDINGS: Gallbladder: Gallbladder is partially distended with thickened irregular wall measuring up to 11 mm. Minimal pericholecystic fluid is noted. No definitive cholelithiasis is seen. Gallbladder sludge is noted. Common bile duct: Diameter: 4.7 mm Liver: Diffuse increased echogenicity is noted consistent with fatty infiltration. No focal mass is noted. Poor flow is reversed and hepatofugal. IMPRESSION: Increased gallbladder wall thickening with mild pericholecystic fluid. This may represent acute cholecystitis in the appropriate clinical setting. HIDA scan may be helpful to assess for gallbladder function. Fatty liver. Hepatofugal portal flow. Electronically Signed   By: Inez Catalina M.D.   On: 01/10/2019 00:53    Wilber Oliphant, M.D. 01/18/2019,  10:29 PM PGY-1, [place] Intern pager: [phone], text pages welcome  FPTS Upper-Level Resident Addendum   I have independently interviewed and examined the patient. I have discussed the above with the original author and agree with their documentation. My edits for correction/addition/clarification are in blue. Please see also any attending notes.   Caroline More, DO PGY-2, [facility] 01/19/2019 3:27 AM  FPTS Service pager: [phone] (text pages welcome through [facility])

## 2019-01-19 ENCOUNTER — Inpatient Hospital Stay (HOSPITAL_COMMUNITY): Payer: No Typology Code available for payment source

## 2019-01-19 ENCOUNTER — Telehealth: Payer: Self-pay | Admitting: Internal Medicine

## 2019-01-19 ENCOUNTER — Other Ambulatory Visit: Payer: Self-pay | Admitting: Internal Medicine

## 2019-01-19 DIAGNOSIS — K7201 Acute and subacute hepatic failure with coma: Secondary | ICD-10-CM | POA: Diagnosis not present

## 2019-01-19 DIAGNOSIS — K7682 Hepatic encephalopathy: Secondary | ICD-10-CM | POA: Diagnosis present

## 2019-01-19 DIAGNOSIS — R531 Weakness: Secondary | ICD-10-CM

## 2019-01-19 DIAGNOSIS — Z515 Encounter for palliative care: Secondary | ICD-10-CM

## 2019-01-19 DIAGNOSIS — K7290 Hepatic failure, unspecified without coma: Secondary | ICD-10-CM | POA: Diagnosis present

## 2019-01-19 LAB — COMPREHENSIVE METABOLIC PANEL
ALT: 93 U/L — ABNORMAL HIGH (ref 0–44)
ALT: 92 U/L — ABNORMAL HIGH (ref 0–44)
AST: 129 U/L — ABNORMAL HIGH (ref 15–41)
AST: 133 U/L — ABNORMAL HIGH (ref 15–41)
Albumin: 1.9 g/dL — ABNORMAL LOW (ref 3.5–5.0)
Albumin: 1.8 g/dL — ABNORMAL LOW (ref 3.5–5.0)
Alkaline Phosphatase: 89 U/L (ref 38–126)
Alkaline Phosphatase: 90 U/L (ref 38–126)
Anion gap: 8 (ref 5–15)
Anion gap: 12 (ref 5–15)
BUN: 15 mg/dL (ref 6–20)
BUN: 15 mg/dL (ref 6–20)
CO2: 26 mmol/L (ref 22–32)
CO2: 23 mmol/L (ref 22–32)
Calcium: 8.6 mg/dL — ABNORMAL LOW (ref 8.9–10.3)
Calcium: 8.5 mg/dL — ABNORMAL LOW (ref 8.9–10.3)
Chloride: 104 mmol/L (ref 98–111)
Chloride: 104 mmol/L (ref 98–111)
Creatinine, Ser: 0.83 mg/dL (ref 0.61–1.24)
Creatinine, Ser: 0.84 mg/dL (ref 0.61–1.24)
GFR calc Af Amer: >60 mL/min (ref >60)
GFR calc Af Amer: >60 mL/min (ref >60)
GFR calc non Af Amer: >60 mL/min (ref >60)
GFR calc non Af Amer: >60 mL/min (ref >60)
Glucose, Bld: 111 mg/dL — ABNORMAL HIGH (ref 70–99)
Glucose, Bld: 98 mg/dL (ref 70–99)
Potassium: 3.2 mmol/L — ABNORMAL LOW (ref 3.5–5.1)
Potassium: 3.3 mmol/L — ABNORMAL LOW (ref 3.5–5.1)
Sodium: 138 mmol/L (ref 135–145)
Sodium: 139 mmol/L (ref 135–145)
Total Bilirubin: 36.2 mg/dL (ref 0.3–1.2)
Total Bilirubin: 32.7 mg/dL (ref 0.3–1.2)
Total Protein: 6.5 g/dL (ref 6.5–8.1)
Total Protein: 6 g/dL — ABNORMAL LOW (ref 6.5–8.1)

## 2019-01-19 LAB — CBC WITH DIFFERENTIAL/PLATELET
Abs Immature Granulocytes: 0.11 10*3/uL — ABNORMAL HIGH (ref 0.00–0.07)
Abs Immature Granulocytes: 0.17 10*3/uL — ABNORMAL HIGH (ref 0.00–0.07)
Basophils Absolute: 0.1 10*3/uL (ref 0.0–0.1)
Basophils Absolute: 0.1 10*3/uL (ref 0.0–0.1)
Basophils Relative: 1 %
Basophils Relative: 1 %
Eosinophils Absolute: 0.1 10*3/uL (ref 0.0–0.5)
Eosinophils Absolute: 0.1 10*3/uL (ref 0.0–0.5)
Eosinophils Relative: 1 %
Eosinophils Relative: 1 %
HCT: 38.1 % — ABNORMAL LOW (ref 39.0–52.0)
HCT: 39 % (ref 39.0–52.0)
Hemoglobin: 13.1 g/dL (ref 13.0–17.0)
Hemoglobin: 13.3 g/dL (ref 13.0–17.0)
Immature Granulocytes: 1 %
Immature Granulocytes: 2 %
Lymphocytes Relative: 12 %
Lymphocytes Relative: 17 %
Lymphs Abs: 1.2 10*3/uL (ref 0.7–4.0)
Lymphs Abs: 1.7 10*3/uL (ref 0.7–4.0)
MCH: 34.4 pg — ABNORMAL HIGH (ref 26.0–34.0)
MCH: 34.8 pg — ABNORMAL HIGH (ref 26.0–34.0)
MCHC: 34.1 g/dL (ref 30.0–36.0)
MCHC: 34.4 g/dL (ref 30.0–36.0)
MCV: 100.8 fL — ABNORMAL HIGH (ref 80.0–100.0)
MCV: 101.3 fL — ABNORMAL HIGH (ref 80.0–100.0)
Monocytes Absolute: 1.3 10*3/uL — ABNORMAL HIGH (ref 0.1–1.0)
Monocytes Absolute: 1.4 10*3/uL — ABNORMAL HIGH (ref 0.1–1.0)
Monocytes Relative: 13 %
Monocytes Relative: 14 %
Neutro Abs: 6.4 10*3/uL (ref 1.7–7.7)
Neutro Abs: 7.5 10*3/uL (ref 1.7–7.7)
Neutrophils Relative %: 66 %
Neutrophils Relative %: 71 %
Platelets: 45 10*3/uL — ABNORMAL LOW (ref 150–400)
Platelets: 46 10*3/uL — ABNORMAL LOW (ref 150–400)
RBC: 3.76 MIL/uL — ABNORMAL LOW (ref 4.22–5.81)
RBC: 3.87 MIL/uL — ABNORMAL LOW (ref 4.22–5.81)
RDW: 19.8 % — ABNORMAL HIGH (ref 11.5–15.5)
RDW: 19.9 % — ABNORMAL HIGH (ref 11.5–15.5)
WBC: 10.4 10*3/uL (ref 4.0–10.5)
WBC: 9.9 10*3/uL (ref 4.0–10.5)
nRBC: 0 % (ref 0.0–0.2)
nRBC: 0 % (ref 0.0–0.2)

## 2019-01-19 LAB — BLOOD CULTURE ID PANEL (REFLEXED)

## 2019-01-19 LAB — FOLATE: Folate: 15.4 ng/mL (ref >5.9)

## 2019-01-19 LAB — MAGNESIUM: Magnesium: 1.9 mg/dL (ref 1.7–2.4)

## 2019-01-19 LAB — BILIRUBIN, FRACTIONATED(TOT/DIR/INDIR)
Bilirubin, Direct: 22.6 mg/dL — ABNORMAL HIGH (ref 0.0–0.2)
Indirect Bilirubin: 13.6 mg/dL — ABNORMAL HIGH (ref 0.3–0.9)
Total Bilirubin: 36.2 mg/dL (ref 0.3–1.2)

## 2019-01-19 MED ORDER — CEPHALEXIN 500 MG PO CAPS
500.0000 mg | ORAL_CAPSULE | Freq: Three times a day (TID) | ORAL | Status: DC
  Administered 2019-01-19: 500 mg via ORAL
  Filled 2019-01-19: qty 1

## 2019-01-19 MED ORDER — PREDNISONE 20 MG PO TABS
40.0000 mg | ORAL_TABLET | Freq: Every day | ORAL | Status: DC
  Administered 2019-01-19: 40 mg via ORAL
  Filled 2019-01-19: qty 2

## 2019-01-19 MED ORDER — SODIUM CHLORIDE 0.9 % IV SOLN
2.0000 g | INTRAVENOUS | Status: DC
  Administered 2019-01-19 – 2019-01-20 (×2): 2 g via INTRAVENOUS
  Filled 2019-01-19 (×4): qty 20

## 2019-01-19 MED ORDER — MAGNESIUM SULFATE 2 GM/50ML IV SOLN
2.0000 g | Freq: Once | INTRAVENOUS | Status: AC
  Administered 2019-01-20: 2 g via INTRAVENOUS
  Filled 2019-01-19: qty 50

## 2019-01-19 MED ORDER — ADULT MULTIVITAMIN W/MINERALS CH
1.0000 | ORAL_TABLET | Freq: Every day | ORAL | Status: DC
  Administered 2019-01-19 – 2019-01-21 (×3): 1 via ORAL
  Filled 2019-01-19 (×3): qty 1

## 2019-01-19 MED ORDER — PROPRANOLOL HCL 20 MG PO TABS
20.0000 mg | ORAL_TABLET | Freq: Two times a day (BID) | ORAL | Status: DC
  Administered 2019-01-19 – 2019-01-21 (×5): 20 mg via ORAL
  Filled 2019-01-19 (×5): qty 1

## 2019-01-19 MED ORDER — ENSURE ENLIVE PO LIQD
237.0000 mL | Freq: Two times a day (BID) | ORAL | Status: DC
  Administered 2019-01-19 – 2019-01-21 (×5): 237 mL via ORAL

## 2019-01-19 MED ORDER — PANTOPRAZOLE SODIUM 40 MG PO TBEC
40.0000 mg | DELAYED_RELEASE_TABLET | ORAL | Status: DC
  Administered 2019-01-19 – 2019-01-21 (×3): 40 mg via ORAL
  Filled 2019-01-19 (×3): qty 1

## 2019-01-19 MED ORDER — CEPHALEXIN 500 MG PO CAPS: 500.0000 mg | ORAL_CAPSULE | Freq: Two times a day (BID) | ORAL | Status: DC

## 2019-01-19 MED ORDER — THIAMINE HCL 100 MG/ML IJ SOLN
500.0000 mg | Freq: Three times a day (TID) | INTRAVENOUS | Status: AC
  Administered 2019-01-19 – 2019-01-20 (×5): 500 mg via INTRAVENOUS
  Filled 2019-01-19 (×6): qty 5

## 2019-01-19 MED ORDER — POTASSIUM CHLORIDE CRYS ER 20 MEQ PO TBCR
40.0000 meq | EXTENDED_RELEASE_TABLET | Freq: Once | ORAL | Status: AC
  Administered 2019-01-19: 40 meq via ORAL
  Filled 2019-01-19: qty 2

## 2019-01-19 MED ORDER — PREDNISOLONE 5 MG PO TABS
40.0000 mg | ORAL_TABLET | Freq: Every day | ORAL | Status: DC
  Administered 2019-01-20 – 2019-01-21 (×2): 40 mg via ORAL
  Filled 2019-01-19 (×2): qty 8

## 2019-01-19 MED ORDER — RIFAXIMIN 550 MG PO TABS
550.0000 mg | ORAL_TABLET | Freq: Two times a day (BID) | ORAL | Status: DC
  Administered 2019-01-19 – 2019-01-21 (×5): 550 mg via ORAL
  Filled 2019-01-19 (×6): qty 1

## 2019-01-19 MED ORDER — FOLIC ACID 1 MG PO TABS
1.0000 mg | ORAL_TABLET | Freq: Every day | ORAL | Status: DC
  Administered 2019-01-19 – 2019-01-21 (×3): 1 mg via ORAL
  Filled 2019-01-19 (×3): qty 1

## 2019-01-19 MED ORDER — LIDOCAINE HCL 1 % IJ SOLN
INTRAMUSCULAR | Status: AC
  Filled 2019-01-19: qty 20

## 2019-01-19 MED ORDER — LACTULOSE 10 GM/15ML PO SOLN
30.0000 g | Freq: Three times a day (TID) | ORAL | Status: DC
  Administered 2019-01-19 – 2019-01-20 (×4): 30 g via ORAL
  Filled 2019-01-19 (×5): qty 45

## 2019-01-19 MED ORDER — POTASSIUM CHLORIDE CRYS ER 20 MEQ PO TBCR
40.0000 meq | EXTENDED_RELEASE_TABLET | Freq: Two times a day (BID) | ORAL | Status: DC
  Administered 2019-01-19 – 2019-01-21 (×5): 40 meq via ORAL
  Filled 2019-01-19: qty 2
  Filled 2019-01-19: qty 4
  Filled 2019-01-19 (×3): qty 2

## 2019-01-19 NOTE — Progress Notes (Signed)
[facility]) Palliative  Pt was referred to community palliative care on previous admission.  He was readmitted prior to his first visit with [facility] palliative team.  [place] will continue to follow and anticipate d/c date to help facilitate palliative services in the community once discharged.  Thank you, Venia Carbon RN, BSN, [facility] Liaison  [phone]

## 2019-01-19 NOTE — Progress Notes (Signed)
Initial Nutrition Assessment  DOCUMENTATION CODES:   Obesity unspecified  INTERVENTION:    Ensure Enlive po BID, each supplement provides 350 kcal and 20 grams of protein  Continue MVI daily, thiamine, and folic acid  NUTRITION DIAGNOSIS:   Increased nutrient needs related to chronic illness(ESLD) as evidenced by estimated needs.  GOAL:   Patient will meet greater than or equal to 90% of their needs  MONITOR:   PO intake, Supplement acceptance, Labs, Weight trends, I & O's, Skin  REASON FOR ASSESSMENT:   Consult Assessment of nutrition requirement/status  ASSESSMENT:   Patient with PMH significant for hepatitis C s/p Mavyret therapy, alcoholism, liver cirrhosis, alcohol induced pancreatitis, and thrombocytopenia. Admitted 4/13 with acute alcoholic hepatitis and stayed for alcohol withdrawal. Presents this admission with weakness in setting of ESLD.    RD working remotely.  Unable to reach pt by phone x3, RN reports he is sleeping. Pt ate 75%of his breakfast this am. Unsure of appetite PTA. Will need to obtain more history if able.   Of note pt was seen by clinical nutrition on 4/20. Ensure was provided, will continue this for now. Palliative following outpatient as pt is not a candidate for transplant.   An admission weight has not been obtained. Per chart records pt looks to have gain weight from Jan to now. Suspect this could be fluid related. Will need to obtain more history.   I/O: +300 ml since admit UOP: 700 ml x 24 hrs   Drips: 500 mg thiamine in NS Medications: folic acid, MVI with minerals, 40 mEq KCl BID, prednisolone Labs: K 3.3 (L) corrected calcium 10.3 (wdl)  Diet Order:   Diet Order            Diet 2 gram sodium Room service appropriate? Yes; Fluid consistency: Thin  Diet effective now              EDUCATION NEEDS:   Not appropriate for education at this time  Skin:  Skin Assessment: Skin Integrity Issues: Skin Integrity Issues:: Other  (Comment) Other: skin tear- penis   Last BM:  PTA  Height:   Ht Readings from Last 1 Encounters:  01/18/19 5\' 11"  (1.803 m)    Weight:   Wt Readings from Last 1 Encounters:  01/16/19 123 kg    Ideal Body Weight:  78.2 kg  BMI:  Body mass index is 37.82 kg/m.  Estimated Nutritional Needs:   Kcal:  8264-1583 kcal  Protein:  115-130 grams  Fluid:  >/= 2 L/day    Mariana Single RD, LDN Clinical Nutrition Pager # - [phone]

## 2019-01-19 NOTE — Progress Notes (Signed)
I have interviewed and examined the patient.  I have discussed the case and verified the key findings with Dr. Maudie Mercury and Tammi Klippel.   I agree with their assessments and plans as documented in their note for today.   Principal Problem:   Hepatic encephalopathy ([place])            - due to Alcoholic cirrhosis of liver without ascites (HCC)                        - resulting in Thrombocytopenia ([place])                         - resulting in History of portal hypertension                                   - See 04/12/18 CT AP ([facility]) LIVER: Cirrhotic morphology of liver. Hepatic steatosis.                                       Recanalized umbilical vein. Gastric and esophageal varices. Perisplenic varices.             - manifesting as somnolent but able to be aroused, psychomotor slowing, (+) asterixis               Active Problems:      Acute Alcoholic hepatitis            - On prednisolone therapy (28 days prednisolone 40 mg daily, then taper off over 16 days)                     - Started on therapy 01/09/19 in ICU    Debilitation  Recommendations - If Alcoholic Hepatitis Discriminant score has not improved with prednisolone therapy, then taper it off. - Increase lactulose dose to achieve to 2-3 bowel movements a day.   - Continue rifaximin daily - Avoid sedative/hypnotic medications, avoid opiates - Treat electrolyte imbalances - Started low dose non-selective beta-blocker for esophageal bleed prophylaxis - Patient's community support system may be inadequate to meet his current level of needs.  Some increased level of care may be needed at discharge.  Await PT/OT recommendations.

## 2019-01-19 NOTE — Telephone Encounter (Signed)
11am:   Palliative Care telehealth visit rescheduled from 01/19/2019 d/t patient back in hospital. Spoke to patient's brother [name] [phone]), with whom patient is staying, who reported that in his correspondences with the hospital staff it was anticipated that patient would be discharged home soon.  We scheduled home Palliative Care telehealth visit to Monday, April 27th at [facility] will contact me (I provided by contact information) if patient is still in the hospital.   Violeta Gelinas NP-C [phone]

## 2019-01-19 NOTE — Progress Notes (Addendum)
Family Medicine Teaching Service Daily Progress Note Intern Pager: [phone]  Patient name: [name] Medical record number: [id number] Date of birth: 1981-07-04 Age: 38 y.o. Gender: male  Primary Care Provider: Nicholes Rough, PA-C Consultants: none Code Status: full  Pt Overview and Major Events to Date:  4/22- re admitted  Assessment and Plan: [name] is a 38 y.o. male admitted for weakness, ataxia and AMS 2/2 unknown cause with negative acute findings on head CT. On admission, found to have UTI.  His chronic conditions include liver failure; Thrombocytopenia; Cirrhosis; Chronic hepatitis C without hepatic coma, hypokalemia  UTI with  incontinence-Patient reports urinary incontinence at home. UA showing positive nitrites and trace leukocytes. Patient received keflex (4/22-4/23) -Follow up urine culture -Start Keflex Q6 hours x 7 days      Bacteremia- patient blood culture resulted positive for E. Coli - switched Keflex to Rocephin (4/23-)  End stage liver Failure, Chronic Hep C, EtOH Cirrhosis- ammonia level 33. No EtOH result 2/2 hyperbilirubinemia. Patient negative for nystagmus or confabulations. Unable to stand due to imbalance, positive for mild asterixis. Positive for mild ascites. Spoke with GI who recommended evaluation for SBP as possible cause of AMS with paracentesis. IR evaluated patient and there was no fluid for procedure.  - Continue lactulose, rifaximin, prednisone -Nutrition consulted, appreciate recommendations -CIWA - no ativan ordered -receiving IV thiamine 500mg  TID x 2 days, then 250mg  once daily x 5 days  -folic acid supplementation  - f/u paracentesis  Thrombocytopenia- stable. Platelets 46 this am. Likely due to his severe end-stage liver failure. -SCDs  Hypokalemia- 3.3 today, 67mEq BID -replete with KDUR PRN -follow on daily CMP  Malnutrition- patient receiving ensure and multivitamin - f/u Nutrition  recommendations  FEN/GI:  protonix 40mg  daily Nutrition: Low sodium diet, ensure, multivitamin  Access: PIV  VTE prophylaxis: SCD's  Disposition: discharge pending clinical improvement  Subjective:  Patient pleasant and able to participate in exam. Appears to be sleepy.  Objective: Temp:  [98.2 F (36.8 C)-98.6 F (37 C)] 98.6 F (37 C) (04/23 0446) Pulse Rate:  [66-80] 77 (04/23 0446) Resp:  [12-25] 20 (04/23 0446) BP: (104-124)/(61-77) 124/69 (04/23 0446) SpO2:  [92 %-98 %] 98 % (04/23 0446) Physical Exam: General: sleepy, NAD Cardiovascular: RRR, no murmur appreciated Respiratory: CTAB, no increased WOB Abdomen: soft, obese, with possible ascites. Not able to palpate liver edge Skin: positive jaundice Extremities: non-pitting edema bilateral LEs Neuro: negative horizontal nystagmus. Not able to stand patient up. Positive mild asterixis   Laboratory: Recent Labs  Lab 01/18/19 1746 01/19/19 0006 01/19/19 0536  WBC 10.0 10.4 9.9  HGB 13.6 13.3 13.1  HCT 40.8 39.0 38.1*  PLT 46* 45* 46*   Recent Labs  Lab 01/18/19 1746 01/19/19 0006 01/19/19 0536  NA 139 138 139  K 3.3* 3.2* 3.3*  CL 102 104 104  CO2 27 26 23   BUN 16 15 15   CREATININE 0.88 0.83 0.84  CALCIUM 9.0 8.6* 8.5*  PROT 6.7 6.5 6.0*  BILITOT 39.3* 36.2*  36.2* 32.7*  ALKPHOS 86 89 90  ALT 99* 93* 92*  AST 142* 129* 133*  GLUCOSE 120* 111* 98   Magnesium: 1.9 Folate 15.4 Thiamine pending  Bilirubin     Component Value Date/Time   BILITOT 32.7 (HH) 01/19/2019 0536   BILIDIR 22.6 (H) 01/19/2019 0006   IBILI 13.6 (H) 01/19/2019 0006    Imaging/Diagnostic Tests: Ct Head Wo Contrast  Result Date: 01/19/2019 CLINICAL DATA:  Weakness and confusion EXAM:  CT HEAD WITHOUT CONTRAST TECHNIQUE: Contiguous axial images were obtained from the base of the skull through the vertex without intravenous contrast. COMPARISON:  None. FINDINGS: Brain: There is no mass, hemorrhage or extra-axial  collection. The size and configuration of the ventricles and extra-axial CSF spaces are normal. The brain parenchyma is normal, without acute or chronic infarction. Vascular: No abnormal hyperdensity of the major intracranial arteries or dural venous sinuses. No intracranial atherosclerosis. Probable right frontal DVA. Skull: The visualized skull base, calvarium and extracranial soft tissues are normal. Sinuses/Orbits: No fluid levels or advanced mucosal thickening of the visualized paranasal sinuses. No mastoid or middle ear effusion. The orbits are normal. IMPRESSION: Normal brain. Electronically Signed   By: Ulyses Jarred M.D.   On: 01/19/2019 02:35   Ir Abdomen US Limited  Result Date: 01/19/2019 CLINICAL DATA:  Alcoholic cirrhosis, hepatitis C and liver failure. EXAM: LIMITED ABDOMEN ULTRASOUND FOR ASCITES TECHNIQUE: Limited ultrasound survey for ascites was performed in all four abdominal quadrants. COMPARISON:  Right upper quadrant abdominal ultrasound on 01/10/2019 FINDINGS: Survey of the abdominal cavity demonstrates no evidence of ascites. IMPRESSION: No ascites identified in the peritoneal cavity. Electronically Signed   By: Aletta Edouard M.D.   On: 01/19/2019 13:58    Richarda Osmond, DO 01/19/2019, 8:03 AM PGY-1, [place] Intern pager: [phone], text pages welcome

## 2019-01-19 NOTE — Progress Notes (Signed)
PHARMACY - PHYSICIAN COMMUNICATION CRITICAL VALUE ALERT - BLOOD CULTURE IDENTIFICATION (BCID)  [name] is an 38 y.o. male who presented to [facility] on 01/18/2019 with a chief complaint of weakness and AMS. PMH EtOH abuse, cirrhosis, chronic hepatitis C. Noted urinary incontinence at home. Currently receiving treatment for UTI. 1/2 admit BCx growing GNRs, BCID E. coli.  Name of physician (or Provider) Contacted: FMTS  Current antibiotics: Cephalexin  Changes to prescribed antibiotics recommended:  D/c cephalexin. Start ceftriaxone.  Results for orders placed or performed during the hospital encounter of 01/18/19  Blood Culture ID Panel (Reflexed) (Collected: 01/18/2019  9:35 PM)  Result Value Ref Range   Enterococcus species NOT DETECTED NOT DETECTED   Listeria monocytogenes NOT DETECTED NOT DETECTED   Staphylococcus species NOT DETECTED NOT DETECTED   Staphylococcus aureus (BCID) NOT DETECTED NOT DETECTED   Streptococcus species NOT DETECTED NOT DETECTED   Streptococcus agalactiae NOT DETECTED NOT DETECTED   Streptococcus pneumoniae NOT DETECTED NOT DETECTED   Streptococcus pyogenes NOT DETECTED NOT DETECTED   Acinetobacter baumannii NOT DETECTED NOT DETECTED   Enterobacteriaceae species DETECTED (A) NOT DETECTED   Enterobacter cloacae complex NOT DETECTED NOT DETECTED   Escherichia coli DETECTED (A) NOT DETECTED   Klebsiella oxytoca NOT DETECTED NOT DETECTED   Klebsiella pneumoniae NOT DETECTED NOT DETECTED   Proteus species NOT DETECTED NOT DETECTED   Serratia marcescens NOT DETECTED NOT DETECTED   Carbapenem resistance NOT DETECTED NOT DETECTED   Haemophilus influenzae NOT DETECTED NOT DETECTED   Neisseria meningitidis NOT DETECTED NOT DETECTED   Pseudomonas aeruginosa NOT DETECTED NOT DETECTED   Candida albicans NOT DETECTED NOT DETECTED   Candida glabrata NOT DETECTED NOT DETECTED   Candida krusei NOT DETECTED NOT DETECTED   Candida parapsilosis NOT DETECTED  NOT DETECTED   Candida tropicalis NOT DETECTED NOT DETECTED   Erin N. Gerarda Fraction, PharmD, [place] PGY2 Infectious Diseases Pharmacy Resident Phone: [phone] 01/19/2019  12:51 PM

## 2019-01-19 NOTE — TOC Initial Note (Signed)
Transition of Care [facility]) - Initial/Assessment Note    Patient Details  Name: [name] MRN: [id number] Date of Birth: [birth year]  [facility] CM/SW Contact:    Carles Collet, RN Phone Number: 01/19/2019, 2:38 PM  Clinical Narrative:                Damaris Schooner w brother [name] who states that patient went home (<48 hours ago) and stayed with his [facility]. He acting confused and was not cooperative with taking his medications getting progressively weaker and ended up back in the hospital. Discussed notes w [name] and he was agreeable to Palliative consult. Palliative services recommended, spoke w patient and he was agreeable and deferred decisions to his brother [name].   Spoke w Anderson Malta from [place] who states they were consulted on patient prior to last DC. They will continue to follow through Epic and have scheduled a Zoom call/ visit with patient for Monday. Anderson Malta states there is not a need to notify when patient discharged, they will be aware through [employer] and communication with [place].  Patient has a RW at home. Patient does not have [place] services through his insurance, palliative CM is anticipating palliative services will be covered per Anderson Malta.    Expected Discharge Plan: Home/Self Care Barriers to Discharge: Continued Medical Work up   Patient Goals and CMS Choice Patient states their goals for this hospitalization and ongoing recovery are:: to go home CMS Medicare.gov Compare Post Acute Care list provided to:: [facility] ) Choice offered to / list presented to : Sibling(Brother [name])  Expected Discharge Plan and Services Expected Discharge Plan: Home/Self Care                                     [facility] Agency: ([place]) Date [place]: 01/19/19 Time [place]: 1437 Representative spoke with at [place]: Venia Carbon  Prior Living Arrangements/Services     Patient language and need for  interpreter reviewed:: Yes Do you feel safe going back to the place where you live?: Yes      Need for Family Participation in Patient Care: Yes (Comment) Care giver support system in place?: Yes (comment)   Criminal Activity/Legal Involvement Pertinent to Current Situation/Hospitalization: No - Comment as needed  Activities of Daily Living Home Assistive Devices/Equipment: None ADL Screening (condition at time of admission) Patient's cognitive ability adequate to safely complete daily activities?: Yes Is the patient deaf or have difficulty hearing?: No Does the patient have difficulty seeing, even when wearing glasses/contacts?: No Does the patient have difficulty concentrating, remembering, or making decisions?: No Patient able to express need for assistance with ADLs?: Yes Does the patient have difficulty dressing or bathing?: No Independently performs ADLs?: Yes (appropriate for developmental age) Does the patient have difficulty walking or climbing stairs?: No Weakness of Legs: Both Weakness of Arms/Hands: None  Permission Sought/Granted                  Emotional Assessment              Admission diagnosis:  Nausea vomiting and diarrhea [R11.2, R19.7] Subacute liver failure with hepatic coma ([place]) [K72.01] Patient Active Problem List   Diagnosis Date Noted  . Weakness 01/18/2019  . AKI (acute kidney injury) ([place])   . Alcohol withdrawal ([place]) 01/12/2019  . Alcoholic hepatitis 65/78/4696  . Hyperbilirubinemia   . Acute on  chronic alcoholic liver disease ([place])   . History of portal hypertension 04/12/2018  . Medication monitoring encounter 01/19/2017  . Weakness of right lower extremity 11/19/2016  . 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 Abnormality of gait   . Post-operative pain   . Infective myositis of right thigh   . Coagulopathy ([place])   . Alcohol withdrawal syndrome with complication ([place])   . Transaminitis   . Infection 11/12/2016  . Alcoholic  cirrhosis of liver without ascites ([place])   . Goals of care, counseling/discussion   . Palliative care by specialist   . Pyogenic arthritis of multiple sites ([place])   . Traumatic compartment syndrome of other sites   . Alcoholic liver failure ([place])   . Traumatic compartment syndrome of lower extremity ([place])   . Infection of lumbar spine ([place]) 11/01/2016  . Delirium tremens ([place])   . Abnormal transaminases   . Alcoholic hepatitis without ascites   . ETOH abuse   . Abscess of right lower extremity   . Cellulitis and abscess of right lower extremity   . Hypernatremia   . Cellulitis of right lower extremity 10/30/2016  . EtOH dependence ([place]) 10/30/2016  . Thrombocytopenia ([place]) 10/30/2016  . Cirrhosis ([place])   . Paraspinal abscess ([place])   . Chronic hepatitis C without hepatic coma ([place])   . Hepatitis   . Epidural abscess 10/29/2016  . Liver failure ([place]) 10/29/2016  . Sepsis ([place]) 10/29/2016  . Hyponatremia 10/29/2016   PCP:  Nicholes Rough, PA-C Pharmacy:   [place], [place] - [address] [place] [address] Phone: [phone] Fax: [phone]     Social Determinants of Health (SDOH) Interventions    Readmission Risk Interventions No flowsheet data found.

## 2019-01-19 NOTE — Progress Notes (Signed)
Patient ID: [name], male   DOB: [birth year], 38 y.o.   MRN: [id number]  Paracentesis requested  [place] Limited Abd performed in IR  No fluid noted; no ascites  NO procedure performed Sent back to room

## 2019-01-20 DIAGNOSIS — D696 Thrombocytopenia, unspecified: Secondary | ICD-10-CM | POA: Diagnosis not present

## 2019-01-20 DIAGNOSIS — R7881 Bacteremia: Secondary | ICD-10-CM

## 2019-01-20 DIAGNOSIS — K7030 Alcoholic cirrhosis of liver without ascites: Secondary | ICD-10-CM | POA: Diagnosis not present

## 2019-01-20 DIAGNOSIS — Z8679 Personal history of other diseases of the circulatory system: Secondary | ICD-10-CM | POA: Diagnosis not present

## 2019-01-20 DIAGNOSIS — K7010 Alcoholic hepatitis without ascites: Secondary | ICD-10-CM | POA: Diagnosis not present

## 2019-01-20 DIAGNOSIS — K7290 Hepatic failure, unspecified without coma: Secondary | ICD-10-CM | POA: Diagnosis not present

## 2019-01-20 DIAGNOSIS — B9620 Unspecified Escherichia coli [E. coli] as the cause of diseases classified elsewhere: Secondary | ICD-10-CM

## 2019-01-20 DIAGNOSIS — N390 Urinary tract infection, site not specified: Secondary | ICD-10-CM

## 2019-01-20 DIAGNOSIS — K7201 Acute and subacute hepatic failure with coma: Secondary | ICD-10-CM | POA: Diagnosis not present

## 2019-01-20 LAB — CBC
HCT: 40.8 % (ref 39.0–52.0)
Hemoglobin: 13.6 g/dL (ref 13.0–17.0)
MCH: 34.5 pg — ABNORMAL HIGH (ref 26.0–34.0)
MCHC: 33.3 g/dL (ref 30.0–36.0)
MCV: 103.6 fL — ABNORMAL HIGH (ref 80.0–100.0)
Platelets: 40 10*3/uL — ABNORMAL LOW (ref 150–400)
RBC: 3.94 MIL/uL — ABNORMAL LOW (ref 4.22–5.81)
RDW: 19.5 % — ABNORMAL HIGH (ref 11.5–15.5)
WBC: 11.5 10*3/uL — ABNORMAL HIGH (ref 4.0–10.5)
nRBC: 0 % (ref 0.0–0.2)

## 2019-01-20 LAB — URINE CULTURE: Culture: >=100000 — AB

## 2019-01-20 LAB — COMPREHENSIVE METABOLIC PANEL
ALT: 96 U/L — ABNORMAL HIGH (ref 0–44)
AST: 125 U/L — ABNORMAL HIGH (ref 15–41)
Albumin: 1.7 g/dL — ABNORMAL LOW (ref 3.5–5.0)
Alkaline Phosphatase: 85 U/L (ref 38–126)
Anion gap: 9 (ref 5–15)
BUN: 16 mg/dL (ref 6–20)
CO2: 23 mmol/L (ref 22–32)
Calcium: 8.5 mg/dL — ABNORMAL LOW (ref 8.9–10.3)
Chloride: 109 mmol/L (ref 98–111)
Creatinine, Ser: 1.08 mg/dL (ref 0.61–1.24)
GFR calc Af Amer: >60 mL/min (ref >60)
GFR calc non Af Amer: >60 mL/min (ref >60)
Glucose, Bld: 132 mg/dL — ABNORMAL HIGH (ref 70–99)
Potassium: 3.7 mmol/L (ref 3.5–5.1)
Sodium: 141 mmol/L (ref 135–145)
Total Bilirubin: 33.6 mg/dL (ref 0.3–1.2)
Total Protein: 5.9 g/dL — ABNORMAL LOW (ref 6.5–8.1)

## 2019-01-20 LAB — MISC LABCORP TEST (SEND OUT)
LabCorp test name: 17996
Labcorp test code: 9985

## 2019-01-20 MED ORDER — SODIUM CHLORIDE 0.9 % IV SOLN
INTRAVENOUS | Status: DC | PRN
  Administered 2019-01-20: 250 mL via INTRAVENOUS

## 2019-01-20 NOTE — Evaluation (Signed)
Occupational Therapy Evaluation Patient Details Name: [name] MRN: [id number] DOB: [birth year] Today's Date: 01/20/2019    History of Present Illness 38 yo male admitted 01/18/19 with hepatic encephalopathy and UTI. Pt with admission 4/13-4/22 with acute on chronic alcoholic hepatitis, pt's s/s including jaundice, N/V/D. PMH includes alcohol abuse with cirrhosis, cellulitis, HepC, pyogenic arthritis, liver failure, decompression/laminectomy of L4-L5 2018, multiple I&Ds including back and RLE.   Clinical Impression   Pt presents with generalized weakness, unsteady gait and impaired cognition. He requires set up to min assist for ADL and min guard assist to ambulate with RW. Will follow acutely.    Follow Up Recommendations  Home health OT;Supervision/Assistance - 24 hour    Equipment Recommendations  Other (comment)(RW)    Recommendations for Other Services       Precautions / Restrictions Precautions Precautions: Fall Restrictions Weight Bearing Restrictions: No      Mobility Bed Mobility Overal bed mobility: Needs Assistance Bed Mobility: Supine to Sit     Supine to sit: Supervision     General bed mobility comments: supervision for IV line and safety, increased time  Transfers Overall transfer level: Needs assistance Equipment used: Rolling walker (2 wheeled) Transfers: Sit to/from Stand Sit to Stand: Supervision         General transfer comment: supervision for safety    Balance Overall balance assessment: Needs assistance Sitting-balance support: Bilateral upper extremity supported;Feet supported Sitting balance-Leahy Scale: Good       Standing balance-Leahy Scale: Poor Standing balance comment: reliant on RW for ambulation and one hand support at sink                           ADL either performed or assessed with clinical judgement   ADL Overall ADL's : Needs assistance/impaired Eating/Feeding: Independent;Sitting    Grooming: Supervision/safety;Standing;Oral care   Upper Body Bathing: Set up;Supervision/ safety;Sitting   Lower Body Bathing: Supervison/ safety;Set up;Sit to/from stand   Upper Body Dressing : Sitting;Minimal assistance Upper Body Dressing Details (indicate cue type and reason): due to IV line Lower Body Dressing: Supervision/safety;Set up;Sit to/from stand   Toilet Transfer: Min guard;Ambulation;RW   Toileting- Clothing Manipulation and Hygiene: Supervision/safety;Sit to/from stand       Functional mobility during ADLs: Min guard;Rolling walker General ADL Comments: Pt blinking eyes and appearing photophobic, but reports no difficulty with vision.     Vision Baseline Vision/History: No visual deficits       Perception     Praxis      Pertinent Vitals/Pain Pain Assessment: No/denies pain     Hand Dominance Right   Extremity/Trunk Assessment Upper Extremity Assessment Upper Extremity Assessment: Overall WFL for tasks assessed   Lower Extremity Assessment Lower Extremity Assessment: Defer to PT evaluation   Cervical / Trunk Assessment Cervical / Trunk Assessment: (forward head)   Communication Communication Communication: No difficulties   Cognition Arousal/Alertness: Awake/alert Behavior During Therapy: Flat affect Overall Cognitive Status: Impaired/Different from baseline Area of Impairment: Problem solving;Following commands                       Following Commands: Follows one step commands with increased time     Problem Solving: Slow processing;Difficulty sequencing;Requires verbal cues     General Comments       Exercises     Shoulder Instructions      Home Living Family/patient expects to be discharged to:: Private residence Living Arrangements: Spouse/significant other(girlfriend)  Available Help at Discharge: Friend(s);Available PRN/intermittently                                    Prior  Functioning/Environment          Comments: pt reports living on and off with his girlfriend        OT Problem List: Decreased strength;Decreased activity tolerance;Impaired balance (sitting and/or standing);Decreased cognition;Decreased knowledge of use of DME or AE      OT Treatment/Interventions: Self-care/ADL training;DME and/or AE instruction;Therapeutic activities;Cognitive remediation/compensation    OT Goals(Current goals can be found in the care plan section) Acute Rehab OT Goals Patient Stated Goal: to return home OT Goal Formulation: With patient Time For Goal Achievement: 02/03/19 Potential to Achieve Goals: Good ADL Goals Pt Will Perform Grooming: with modified independence;standing Pt Will Perform Lower Body Bathing: with modified independence;sit to/from stand Pt Will Perform Lower Body Dressing: with modified independence;sit to/from stand Pt Will Transfer to Toilet: with modified independence;ambulating;regular height toilet Pt Will Perform Toileting - Clothing Manipulation and hygiene: with modified independence;sit to/from stand Additional ADL Goal #1: Pt will identify and gather items necessary for ADL around his room modified independently.  OT Frequency: Min 2X/week   Barriers to D/C:            Co-evaluation              AM-PAC OT "6 Clicks" Daily Activity     Outcome Measure Help from another person eating meals?: None Help from another person taking care of personal grooming?: A Little Help from another person toileting, which includes using toliet, bedpan, or urinal?: A Little Help from another person bathing (including washing, rinsing, drying)?: A Little Help from another person to put on and taking off regular upper body clothing?: A Little Help from another person to put on and taking off regular lower body clothing?: A Little 6 Click Score: 19   End of Session Equipment Utilized During Treatment: Gait belt;Rolling walker  Activity  Tolerance: Patient tolerated treatment well Patient left: in chair;with call bell/phone within reach;with chair alarm set  OT Visit Diagnosis: Unsteadiness on feet (R26.81);Muscle weakness (generalized) (M62.81);Other symptoms and signs involving cognitive function                Time: 1035-1058 OT Time Calculation (min): 23 min Charges:  OT General Charges $OT Visit: 1 Visit OT Evaluation $OT Eval Moderate Complexity: 1 Mod  Nestor Lewandowsky, OTR/L Acute Rehabilitation Services Pager: [phone] Office: [phone]  [name] 01/20/2019, 12:56 PM

## 2019-01-20 NOTE — Progress Notes (Signed)
Patient appears agitated. Bed alarm on and patient instructed to use call bell and patient verbalized understanding. Will continue to monitor. Marland Kitchen

## 2019-01-20 NOTE — Progress Notes (Signed)
Patient not seen; had been admitted to the hospital. No charge. Violeta Gelinas NP-C [phone]

## 2019-01-20 NOTE — Progress Notes (Addendum)
Family Medicine Teaching Service Daily Progress Note Intern Pager: [phone]  Patient name: [name] Medical record number: [id number] Date of birth: [birth year] Age: 38 y.o. Gender: male  Primary Care Provider: Nicholes Rough, PA-C Consultants: none Code Status: full  Pt Overview and Major Events to Date:  4/22- re admitted  Assessment and Plan: [name] is a 38 y.o. male admitted for weakness, ataxia and AMS 2/2 unknown cause with negative acute findings on head CT. On admission, found to have UTI.  His chronic conditions include liver failure; Thrombocytopenia; Cirrhosis; Chronic hepatitis C without hepatic coma, hypokalemia  UTI with incontinence-Patient reports urinary incontinence at home. UA showing positive nitrites and trace leukocytes. Patient received keflex (4/22-4/23) -Follow up urine culture -Start Keflex Q6 hours x 7 days      Bacteremia- patient blood culture resulted positive for E. Coli. Culture sensitivities pending. - switched Keflex to Rocephin (4/23-)  End stage liver Failure, Chronic Hep C, EtOH Cirrhosis- GI recommended r/u SBP yesterday, IR evaluated patient and there was no fluid for procedure. Will transition patient to lower dose of B1 tomorrow. Patient still not ambulating. PT/OT ordered for evaluation and treatment. Patient states he may be open to going to a rehab facility to regain strength and mobility. - Continue lactulose, rifaximin - changed prednisone to prednisolone -receiving IV thiamine 500mg  TID x 2 days, then 250mg  once daily x 5 days  -folic acid supplementation  - follow up PT/OT  Thrombocytopenia- stable. Platelets 46 yesterday. Likely due to his severe end-stage liver failure. CBC pending today -SCDs - CBC daily  Hypokalemia- 3.3 yesterday, 46mEq BID. CMP pending today -replete with KDUR PRN -follow on daily CMP  Malnutrition- patient receiving ensure and multivitamin - f/u Nutrition  recommendations  FEN/GI:  protonix 40mg  daily Nutrition: Low sodium diet, ensure, multivitamin  Access: PIV access VTE prophylaxis: SCD's  Disposition: discharge pending clinical improvement  Subjective:  Patient asleep but easily arousable with a shoulder touch. He was appropriate in conversation and did not appear to be agitated. We spoke about physical therapy and discharge to a rehab facility possibly which he said he would think about. He really wants to go home at this time.  Objective: Temp:  [98 F (36.7 C)-98.5 F (36.9 C)] 98 F (36.7 C) (04/24 0605) Pulse Rate:  [53-76] 53 (04/24 0605) Resp:  [17-18] 17 (04/24 0605) BP: (105-132)/(58-72) 105/65 (04/24 0605) SpO2:  [97 %-100 %] 100 % (04/24 0605) Weight:  [121.5 kg] 121.5 kg (04/23 2033) Physical Exam: General: sleepy, NAD Cardiovascular: RRR, no murmur appreciated Respiratory: CTAB, no increased WOB Abdomen: soft, obese, negative fluid wave. Not able to palpate liver edge Skin: positive jaundice Extremities: non-pitting edema bilateral LEs Neuro: negative horizontal nystagmus. Not able to stand patient up. Positive mild asterixis   Laboratory: Recent Labs  Lab 01/18/19 1746 01/19/19 0006 01/19/19 0536  WBC 10.0 10.4 9.9  HGB 13.6 13.3 13.1  HCT 40.8 39.0 38.1*  PLT 46* 45* 46*   Recent Labs  Lab 01/18/19 1746 01/19/19 0006 01/19/19 0536  NA 139 138 139  K 3.3* 3.2* 3.3*  CL 102 104 104  CO2 27 26 23   BUN 16 15 15   CREATININE 0.88 0.83 0.84  CALCIUM 9.0 8.6* 8.5*  PROT 6.7 6.5 6.0*  BILITOT 39.3* 36.2*  36.2* 32.7*  ALKPHOS 86 89 90  ALT 99* 93* 92*  AST 142* 129* 133*  GLUCOSE 120* 111* 98   Magnesium: 1.9 yesterday Folate 15.4 Thiamine pending  Bilirubin     Component Value Date/Time   BILITOT 32.7 (HH) 01/19/2019 0536   BILIDIR 22.6 (H) 01/19/2019 0006   IBILI 13.6 (H) 01/19/2019 0006    Imaging/Diagnostic Tests: Ct Head Wo Contrast  Result Date: 01/19/2019 CLINICAL DATA:   Weakness and confusion EXAM: CT HEAD WITHOUT CONTRAST TECHNIQUE: Contiguous axial images were obtained from the base of the skull through the vertex without intravenous contrast. COMPARISON:  None. FINDINGS: Brain: There is no mass, hemorrhage or extra-axial collection. The size and configuration of the ventricles and extra-axial CSF spaces are normal. The brain parenchyma is normal, without acute or chronic infarction. Vascular: No abnormal hyperdensity of the major intracranial arteries or dural venous sinuses. No intracranial atherosclerosis. Probable right frontal DVA. Skull: The visualized skull base, calvarium and extracranial soft tissues are normal. Sinuses/Orbits: No fluid levels or advanced mucosal thickening of the visualized paranasal sinuses. No mastoid or middle ear effusion. The orbits are normal. IMPRESSION: Normal brain. Electronically Signed   By: Ulyses Jarred M.D.   On: 01/19/2019 02:35   Ir Abdomen US Limited  Result Date: 01/19/2019 CLINICAL DATA:  Alcoholic cirrhosis, hepatitis C and liver failure. EXAM: LIMITED ABDOMEN ULTRASOUND FOR ASCITES TECHNIQUE: Limited ultrasound survey for ascites was performed in all four abdominal quadrants. COMPARISON:  Right upper quadrant abdominal ultrasound on 01/10/2019 FINDINGS: Survey of the abdominal cavity demonstrates no evidence of ascites. IMPRESSION: No ascites identified in the peritoneal cavity. Electronically Signed   By: Aletta Edouard M.D.   On: 01/19/2019 13:58    Richarda Osmond, DO 01/20/2019, 9:03 AM PGY-1, [place] Intern pager: [phone], text pages welcome

## 2019-01-20 NOTE — Consult Note (Signed)
Consultation  Referring Provider: Family medicine service/ [place] Primary Care Physician:  Nicholes Rough, PA-C Primary Gastroenterologist:  Dr.[name]  Reason for Consultation: Acute severe alcoholic hepatitis  HPI: [name] is a 38 y.o. male, recently known to our GI service and seen in consultation during his last admission.  Patient was discharged to home on 01/17/2019 and readmitted on 12/29/2018.  He was initially admitted 01/09/2019 and diagnosed with acute severe alcoholic hepatitis, superimposed on decompensated cirrhosis.  He had initial discriminant function score of 86 and therefore was started on prednisolone 40 mg p.o. daily.  His initial course was complicated by alcohol withdrawal/delirium tremens.  He was also treated for early hepatorenal syndrome with octreotide and albumin.   He had not had any real improvement in DF score or M ELD at the time of discharge.  Patient does not have chronic hepatitis C, he did have hepatitis C, was treated and virus was eradicated.  Hep C RNA PCR quant -1-week ago.  He was still having some issues with confusion when he was discharged on 01/17/2019.  He was to continue on lactulose 30 cc twice daily start Xifaxan 550 twice daily, and I believe was discharged on Librium.  He went to his brother's home.  He apparently had worsening confusion, uncooperative with medications and incontinent of urine.  He was brought back to the emergency room.  Work-up this far positive for urinary tract infection.  Urine culture growing E. coli ,and blood cultures now positive for Enterobacter , and E. Coli.  T bili yesterday down to 32.7 creatinine 0.84, platelets 46.  INR has not been checked Venous ammonia normal on admission.  Patient is sleeping currently, arouses to voice then falls back to sleep.  Unable to give any good history.     Past Medical History:  Diagnosis Date  . Alcoholic cirrhosis ([place]) 20/2542   confirmed by liver biopsy.     . Cellulitis 10/2016   right leg  . 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 ETOH abuse   . Hepatitis C 2015   Hepatitis C. Rx Solvaldi/Ribaviran in 2015, undetectable Hep C 03/2015 (see Care Everywhere) but did not have sustained response. Began 12 weeks Mayvret 10/2017 by [facility] [place], [place] PA-C  . Hepatitis, alcoholic 70/6237   treated with Prednisolone.    . Pancreatitis, alcoholic, acute 62/8315  . Paraspinal abscess ([place])   . Thrombocytopenia ([place]) 25/2018    Past Surgical History:  Procedure Laterality Date  . DECOMPRESSIVE LUMBAR LAMINECTOMY LEVEL 1 N/A 11/01/2016   Procedure: DECOMPRESSIVE POSTERIOR LUMBAR LAMINECTOMY LEVEL 4-5;  Surgeon: Melina Schools, MD;  Location: [facility];  Service: Orthopedics;  Laterality: N/A;  . ESOPHAGOGASTRODUODENOSCOPY  17/61/6073   severe ulcertive esophagitis, retching related gastropathy, small HH.  no varices or portal hypertensive gastropathy.  Dr Lucienne Capers at [facility] in [place]  . ESOPHAGOGASTRODUODENOSCOPY  05/2014   Dr Rolan Lipa.  esophageal erythema (no Barretts on path), small HH, gastritis, duodenitis.    Marland Kitchen HERNIA REPAIR Left    INGUNIAL  . I&D EXTREMITY Right 11/01/2016   Procedure: IRRIGATION AND DEBRIDEMENT ABSCESS RIGHT THIGH;  Surgeon: Melina Schools, MD;  Location: [place];  Service: Orthopedics;  Laterality: Right;  . INCISION AND DRAINAGE OF WOUND N/A 01/06/2017   Procedure: IRRIGATION AND DEBRIDEMENT and placement of wound vac;  Surgeon: Melina Schools, MD;  Location: [place];  Service: Orthopedics;  Laterality: N/A;  . INCISION AND DRAINAGE OF WOUND N/A 12/02/2016  Procedure: WASHOUT AND CLOSURE OF WOUND;  Surgeon: Melina Schools, MD;  Location: [place];  Service: Orthopedics;  Laterality: N/A;  . LUMBAR WOUND DEBRIDEMENT N/A 11/12/2016   Procedure: LUMBAR WOUND DEBRIDEMENT;  Surgeon: Melina Schools, MD;  Location: [place];  Service: Orthopedics;  Laterality: N/A;    Prior  to Admission medications   Medication Sig Start Date End Date Taking? Authorizing Provider  feeding supplement, ENSURE ENLIVE, (ENSURE ENLIVE) LIQD Take 237 mLs by mouth 2 (two) times daily between meals. 01/17/19  Yes Rory Percy, DO  lactulose (CHRONULAC) 10 GM/15ML solution Take 45 mLs (30 g total) by mouth 2 (two) times daily. 01/17/19  Yes Rory Percy, DO  Multiple Vitamin (MULTIVITAMIN WITH MINERALS) TABS tablet Take 1 tablet by mouth daily. 11/17/16  Yes Elgergawy, Silver Huguenin, MD  pantoprazole (PROTONIX) 40 MG tablet Take 1 tablet (40 mg total) by mouth every morning. 01/17/19  Yes Rumball, Bryson Ha, DO  predniSONE (DELTASONE) 20 MG tablet Take 2 tablets (40 mg total) by mouth daily with breakfast for 21 days. 01/17/19 02/07/19 Yes Rumball, Bryson Ha, DO  rifaximin (XIFAXAN) 550 MG TABS tablet Take 1 tablet (550 mg total) by mouth 2 (two) times daily for 20 days. 01/17/19 02/06/19 Yes Rory Percy, DO  tetrahydrozoline-zinc (VISINE-AC) 0.05-0.25 % ophthalmic solution Place 2 drops into both eyes daily as needed (itchy eyes).   Yes [provider]  ibuprofen (ADVIL,MOTRIN) 200 MG tablet Take 400 mg by mouth every 6 (six) hours as needed for headache.    [provider]    Current Facility-Administered Medications  Medication Dose Route Frequency Provider Last Rate Last Dose  . 0.9 %  sodium chloride infusion   Intravenous PRN McDiarmid, Blane Ohara, MD 5 mL/hr at 01/20/19 0329 250 mL at 01/20/19 0329  . cefTRIAXone (ROCEPHIN) 2 g in sodium chloride 0.9 % 100 mL IVPB  2 g Intravenous Q24H Anderson, Chelsey L, DO 200 mL/hr at 01/19/19 1658 2 g at 01/19/19 1658  . feeding supplement (ENSURE ENLIVE) (ENSURE ENLIVE) liquid 237 mL  237 mL Oral BID BM McDiarmid, Blane Ohara, MD   237 mL at 01/20/19 1012  . folic acid (FOLVITE) tablet 1 mg  1 mg Oral Daily Wilber Oliphant, MD   1 mg at 01/20/19 1013  . lactulose (CHRONULAC) 10 GM/15ML solution 30 g  30 g Oral TID Caroline More, DO   30 g at  01/19/19 2243  . magnesium sulfate IVPB 2 g 50 mL  2 g Intravenous Once McDiarmid, Blane Ohara, MD      . multivitamin with minerals tablet 1 tablet  1 tablet Oral Daily Caroline More, DO   1 tablet at 01/20/19 1014  . pantoprazole (PROTONIX) EC tablet 40 mg  40 mg Oral Loreli Dollar, Sherin, DO   40 mg at 01/20/19 1013  . potassium chloride SA (K-DUR) CR tablet 40 mEq  40 mEq Oral BID Anderson, Chelsey L, DO   40 mEq at 01/20/19 1012  . prednisoLONE tablet 40 mg  40 mg Oral Daily Beard, Samantha N, DO   40 mg at 01/20/19 1013  . propranolol (INDERAL) tablet 20 mg  20 mg Oral BID McDiarmid, Blane Ohara, MD   20 mg at 01/20/19 1013  . rifaximin (XIFAXAN) tablet 550 mg  550 mg Oral BID Tammi Klippel, Sherin, DO   550 mg at 01/20/19 1013  . thiamine 500mg  in normal saline (48ml) IVPB  500 mg Intravenous TID Wilber Oliphant, MD 100 mL/hr at 01/20/19 1032 500  mg at 01/20/19 1032    Allergies as of 01/18/2019 - Review Complete 01/18/2019  Allergen Reaction Noted  . Acetaminophen Other (See Comments) 11/27/2016    Family History  Problem Relation Age of Onset  . Diabetes Mother     Social History   Socioeconomic History  . Marital status: Single    Spouse name: Not on file  . Number of children: Not on file  . Years of education: Not on file  . Highest education level: Not on file  Occupational History  . Not on file  Social Needs  . Financial resource strain: Not on file  . Food insecurity:    Worry: Not on file    Inability: Not on file  . Transportation needs:    Medical: Not on file    Non-medical: Not on file  Tobacco Use  . Smoking status: Former Smoker    Last attempt to quit: 01/04/2017    Years since quitting: 2.0  . Smokeless tobacco: Never Used  . Tobacco comment: was a social smoker- maybe 5 a week  Substance and Sexual Activity  . Alcohol use: Yes    Comment: occasionally  . Drug use: No    Comment: as a teen a few times  . Sexual activity: Not on file  Lifestyle  . Physical  activity:    Days per week: Not on file    Minutes per session: Not on file  . Stress: Not on file  Relationships  . Social connections:    Talks on phone: Not on file    Gets together: Not on file    Attends religious service: Not on file    Active member of club or organization: Not on file    Attends meetings of clubs or organizations: Not on file    Relationship status: Not on file  . Intimate partner violence:    Fear of current or ex partner: Not on file    Emotionally abused: Not on file    Physically abused: Not on file    Forced sexual activity: Not on file  Other Topics Concern  . Not on file  Social History Narrative   Patient lives with his girlfriend in [place].     Review of Systems: Pertinent positive and negative review of systems were noted in the above HPI section.  All other review of systems was otherwise negative.Marland Kitchen  Physical Exam: Vital signs in last 24 hours: Temp:  [98 F (36.7 C)-98.5 F (36.9 C)] 98 F (36.7 C) (04/24 0605) Pulse Rate:  [53-76] 61 (04/24 1006) Resp:  [16-18] 16 (04/24 1006) BP: (105-132)/(57-72) 109/57 (04/24 1006) SpO2:  [95 %-100 %] 95 % (04/24 1006) Weight:  [121.5 kg] 121.5 kg (04/23 2033) Last BM Date: 01/19/19 General:   Alert,  Well-developed, ill-appearing white male, deeply jaundiced in no acute distress Head:  Normocephalic and atraumatic. Eyes:  Sclera icteric ,conjunctiva pink. Ears:  Normal auditory acuity. Nose:  No deformity, discharge,  or lesions. Mouth:  No deformity or lesions.   Neck:  Supple; no masses or thyromegaly. Lungs:  Clear throughout to auscultation.   No wheezes, crackles, or rhonchi.  Heart:  Regular rate and rhythm; no murmurs, clicks, rubs,  or gallops. Abdomen:  Soft,nontender, BS active,nonpalp mass or hsm.   Rectal:  Deferred  Msk:  Symmetrical without gross deformities. . Pulses:  Normal pulses noted. Extremities:  Without clubbing or edema. Neurologic: Somnolent but arousable no  asterixis. Skin:  Intact without significant lesions  or rashes.. Psych:  cooperative.   Intake/Output from previous day: 04/23 0701 - 04/24 0700 In: 958 [P.O.:640; I.V.:9.5; IV Piggyback:308.5] Out: 202 [Urine:201; Stool:1] Intake/Output this shift: Total I/O In: 120 [P.O.:120] Out: -   Lab Results: Recent Labs    01/18/19 1746 01/19/19 0006 01/19/19 0536  WBC 10.0 10.4 9.9  HGB 13.6 13.3 13.1  HCT 40.8 39.0 38.1*  PLT 46* 45* 46*   BMET Recent Labs    01/18/19 1746 01/19/19 0006 01/19/19 0536  NA 139 138 139  K 3.3* 3.2* 3.3*  CL 102 104 104  CO2 27 26 23   GLUCOSE 120* 111* 98  BUN 16 15 15   CREATININE 0.88 0.83 0.84  CALCIUM 9.0 8.6* 8.5*   LFT Recent Labs    01/19/19 0006 01/19/19 0536  PROT 6.5 6.0*  ALBUMIN 1.9* 1.8*  AST 129* 133*  ALT 93* 92*  ALKPHOS 89 90  BILITOT 36.2*  36.2* 32.7*  BILIDIR 22.6*  --   IBILI 13.6*  --    PT/INR No results for input(s): LABPROT, INR in the last 72 hours.    IMPRESSION:  #67 38 year old white male alcoholic with underlying decompensated cirrhosis, initially admitted 01/09/2019 and found to have acute severe alcoholic hepatitis with discriminant function score of 86. Course complicated by acute alcohol withdrawal and delirium tremens. Also developed early hepatorenal syndrome which was treated and resolved   Patient was started on 40 mg of prednisolone daily, and was discharged home on 01/17/2019 on prednisone. He had not had any significant improvement at that time in either MELD or discriminant function.  Patient was readmitted 24 hours later with increased confusion, cooperative with medications and with urinary incontinence There was concern for confusion secondary to hepatic encephalopathy and/or possibly medication effect.  He has since been found to have a urinary tract infection and bacteremia with blood cultures growing E. coli and Enterobacter.  Acute infection certainly precipitated worsening of  his mental status.  Plan; Check INR today and follow  INR Continue lactulose 30 cc twice daily and Xifaxan 550 twice daily Inderal has been started for portal  hypertension Continue  prednisone 40 mg p.o. daily for now, will recalculate MELD and DF when INR available Fortunately renal function remains normal and T bili is actually trending down.  Hopeful that he is starting to show some signs of recovery.  Although severe acute alcoholic hepatitis with initial discriminant function higher than 32, does carry a significant mortality rate of 35 to 45%, he has at least a 50% chance of survival. If he can qualify for rehab placement of some sort to help him get through the next 1 to 2 months and he can assess fully stay off of alcohol over the next 6 months he will be a candidate for liver transplant.  GI is available, please call over the weekend for problems, otherwise will reevaluate Monday Dr. Benjaman Pott our gastroenterology has accepted patient for outpatient follow-up   Courtany Mcmurphy PA-C 01/20/2019, 10:52 AM

## 2019-01-20 NOTE — Evaluation (Signed)
Physical Therapy Evaluation Patient Details Name: [name] MRN: [id number] DOB: [birth year] Today's Date: 01/20/2019   History of Present Illness  38 yo male admitted 01/18/19 with hepatic encephalopathy and UTI. Pt with admission 4/13-4/22 with acute on chronic alcoholic hepatitis, pt's s/s including jaundice, N/V/D. PMH includes alcohol abuse with cirrhosis, cellulitis, HepC, pyogenic arthritis, liver failure, decompression/laminectomy of L4-L5 2018, multiple I&Ds including back and RLE.  Clinical Impression   Pt presents with LE generalized weakness, increased time and effort to perform mobility tasks, decreased safety awareness, and decreased tolerance for ambulation due to weakness/fatigue. Pt to benefit from acute PT to address deficits. Pt ambulated hallway distance with RW with min guard assist for safety, required verbal cuing for safe use of RW during session. Pt appears improved with mobility vs when this PT evaled pt during his last hospital stay, continuing to recommend HHPT to address deficits at home. See follow-up recommendation below. PT to progress mobility as tolerated, and will continue to follow acutely.      Follow Up Recommendations Home health PT;Other (comment)(Per last visit documentation, HHPT not covered by pt insurance. Will update pt's HEP)    Equipment Recommendations  None recommended by PT    Recommendations for Other Services       Precautions / Restrictions Precautions Precautions: Fall Restrictions Weight Bearing Restrictions: No      Mobility  Bed Mobility Overal bed mobility: Needs Assistance Bed Mobility: Supine to Sit     Supine to sit: Supervision     General bed mobility comments: supervision for safety, line management. Increased time and use of bedrails to come to sitting.   Transfers Overall transfer level: Needs assistance Equipment used: Rolling walker (2 wheeled) Transfers: Sit to/from Stand Sit to Stand: Min guard          General transfer comment: Min guard for safety, pt with self-steadying upon standing. Slow to stand.   Ambulation/Gait Ambulation/Gait assistance: Min guard Gait Distance (Feet): 120 Feet Assistive device: Rolling walker (2 wheeled) Gait Pattern/deviations: Step-through pattern;Decreased stride length;Trunk flexed;Drifts right/left;Shuffle Gait velocity: decr    General Gait Details: Min guard for safety, verbal cuing for placement in RW multiple times, turning with RW. Pt with short shuffling steps, but improved from last hospitalization. No overt unsteadiness noted  Stairs            Wheelchair Mobility    Modified Rankin (Stroke Patients Only)       Balance Overall balance assessment: Needs assistance Sitting-balance support: Bilateral upper extremity supported;Feet supported Sitting balance-Leahy Scale: Good       Standing balance-Leahy Scale: Poor Standing balance comment: reliant on RW for ambulation                              Pertinent Vitals/Pain Pain Assessment: No/denies pain    Home Living Family/patient expects to be discharged to:: Private residence Living Arrangements: Spouse/significant other(Per chart review, pt's girlfriend is also an alcoholic, family does not want him to d/c home with her) Available Help at Discharge: Family;Available PRN/intermittently Type of Home: House Home Access: Ramped entrance     Home Layout: One level Home Equipment: Walker - 2 wheels      Prior Function Level of Independence: Independent         Comments: Pt reports he has a RW "just in case I need it", but typically is independent in ADLs and mobility. Pt works for [employer]  company, and reports being on his feet all day with work.      Hand Dominance   Dominant Hand: Right    Extremity/Trunk Assessment   Upper Extremity Assessment Upper Extremity Assessment: Defer to OT evaluation    Lower Extremity  Assessment Lower Extremity Assessment: Generalized weakness    Cervical / Trunk Assessment Cervical / Trunk Assessment: Other exceptions(forward head)  Communication   Communication: No difficulties  Cognition Arousal/Alertness: Awake/alert Behavior During Therapy: Flat affect Overall Cognitive Status: Impaired/Different from baseline Area of Impairment: Problem solving;Following commands;Safety/judgement                       Following Commands: Follows one step commands with increased time;Follows one step commands consistently Safety/Judgement: Decreased awareness of safety   Problem Solving: Slow processing;Difficulty sequencing;Requires verbal cues General Comments: Pt with gown half-on upon arrival to room. Pt somewhat drowsy with PT/OT, answers questions appropriately.       General Comments      Exercises     Assessment/Plan    PT Assessment Patient needs continued PT services  PT Problem List Decreased strength;Decreased safety awareness;Decreased mobility;Decreased activity tolerance;Decreased balance;Decreased knowledge of use of DME       PT Treatment Interventions DME instruction;Functional mobility training;Balance training;Gait training;Therapeutic activities;Therapeutic exercise;Patient/family education    PT Goals (Current goals can be found in the Care Plan section)  Acute Rehab PT Goals Patient Stated Goal: to return home PT Goal Formulation: With patient Time For Goal Achievement: 02/03/19 Potential to Achieve Goals: Good    Frequency Min 3X/week   Barriers to discharge        Co-evaluation PT/OT/SLP Co-Evaluation/Treatment: Yes Reason for Co-Treatment: Complexity of the patient's impairments (multi-system involvement);For patient/therapist safety;To address functional/ADL transfers PT goals addressed during session: Mobility/safety with mobility OT goals addressed during session: ADL's and self-care       AM-PAC PT "6 Clicks"  Mobility  Outcome Measure Help needed turning from your back to your side while in a flat bed without using bedrails?: A Little Help needed moving from lying on your back to sitting on the side of a flat bed without using bedrails?: A Little Help needed moving to and from a bed to a chair (including a wheelchair)?: A Little Help needed standing up from a chair using your arms (e.g., wheelchair or bedside chair)?: A Little Help needed to walk in hospital room?: A Little Help needed climbing 3-5 steps with a railing? : A Little 6 Click Score: 18    End of Session Equipment Utilized During Treatment: Gait belt Activity Tolerance: Patient tolerated treatment well Patient left: Other (comment)(at sink with OT) Nurse Communication: Mobility status PT Visit Diagnosis: Other abnormalities of gait and mobility (R26.89);Difficulty in walking, not elsewhere classified (R26.2);Muscle weakness (generalized) (M62.81)    Time: 9604-5409 PT Time Calculation (min) (ACUTE ONLY): 14 min   Charges:   PT Evaluation $PT Eval Low Complexity: 1 Low         Deira Shimer Conception Chancy, PT Acute Rehabilitation Services Pager [phone]  Office [phone]  Tayjon Halladay D Elonda Husky 01/20/2019, 2:20 PM

## 2019-01-21 DIAGNOSIS — K7010 Alcoholic hepatitis without ascites: Secondary | ICD-10-CM | POA: Diagnosis not present

## 2019-01-21 DIAGNOSIS — K7290 Hepatic failure, unspecified without coma: Secondary | ICD-10-CM | POA: Diagnosis not present

## 2019-01-21 DIAGNOSIS — N390 Urinary tract infection, site not specified: Secondary | ICD-10-CM

## 2019-01-21 DIAGNOSIS — B9620 Unspecified Escherichia coli [E. coli] as the cause of diseases classified elsewhere: Secondary | ICD-10-CM

## 2019-01-21 DIAGNOSIS — R7881 Bacteremia: Secondary | ICD-10-CM | POA: Diagnosis not present

## 2019-01-21 DIAGNOSIS — K7030 Alcoholic cirrhosis of liver without ascites: Secondary | ICD-10-CM | POA: Diagnosis not present

## 2019-01-21 LAB — COMPREHENSIVE METABOLIC PANEL
ALT: 102 U/L — ABNORMAL HIGH (ref 0–44)
AST: 129 U/L — ABNORMAL HIGH (ref 15–41)
Albumin: 1.8 g/dL — ABNORMAL LOW (ref 3.5–5.0)
Alkaline Phosphatase: 92 U/L (ref 38–126)
Anion gap: 9 (ref 5–15)
BUN: 20 mg/dL (ref 6–20)
CO2: 24 mmol/L (ref 22–32)
Calcium: 8.7 mg/dL — ABNORMAL LOW (ref 8.9–10.3)
Chloride: 107 mmol/L (ref 98–111)
Creatinine, Ser: 1.24 mg/dL (ref 0.61–1.24)
GFR calc Af Amer: >60 mL/min (ref >60)
GFR calc non Af Amer: >60 mL/min (ref >60)
Glucose, Bld: 99 mg/dL (ref 70–99)
Potassium: 4.6 mmol/L (ref 3.5–5.1)
Sodium: 140 mmol/L (ref 135–145)
Total Bilirubin: 41.4 mg/dL (ref 0.3–1.2)
Total Protein: 6.3 g/dL — ABNORMAL LOW (ref 6.5–8.1)

## 2019-01-21 LAB — CULTURE, BLOOD (ROUTINE X 2): Special Requests: ADEQUATE

## 2019-01-21 LAB — CBC
HCT: 40.9 % (ref 39.0–52.0)
Hemoglobin: 13.8 g/dL (ref 13.0–17.0)
MCH: 34.8 pg — ABNORMAL HIGH (ref 26.0–34.0)
MCHC: 33.7 g/dL (ref 30.0–36.0)
MCV: 103.3 fL — ABNORMAL HIGH (ref 80.0–100.0)
Platelets: 47 10*3/uL — ABNORMAL LOW (ref 150–400)
RBC: 3.96 MIL/uL — ABNORMAL LOW (ref 4.22–5.81)
RDW: 19.2 % — ABNORMAL HIGH (ref 11.5–15.5)
WBC: 13.8 10*3/uL — ABNORMAL HIGH (ref 4.0–10.5)
nRBC: 0 % (ref 0.0–0.2)

## 2019-01-21 LAB — PROTIME-INR
INR: 2.8 — ABNORMAL HIGH (ref 0.8–1.2)
Prothrombin Time: 28.7 seconds — ABNORMAL HIGH (ref 11.4–15.2)

## 2019-01-21 LAB — MAGNESIUM: Magnesium: 2.4 mg/dL (ref 1.7–2.4)

## 2019-01-21 MED ORDER — AMOXICILLIN 875 MG PO TABS: 875.0000 mg | ORAL_TABLET | Freq: Two times a day (BID) | ORAL | 0 refills | Status: AC

## 2019-01-21 MED ORDER — THIAMINE HCL 250 MG PO TABS: 250.0000 mg | ORAL_TABLET | Freq: Every day | ORAL | 0 refills | Status: AC

## 2019-01-21 MED ORDER — FOLIC ACID 1 MG PO TABS: 1.0000 mg | ORAL_TABLET | Freq: Every day | ORAL | 0 refills | Status: DC

## 2019-01-21 MED ORDER — LACTULOSE 10 GM/15ML PO SOLN: 30.0000 g | Freq: Two times a day (BID) | ORAL | 0 refills | Status: DC

## 2019-01-21 MED ORDER — PROPRANOLOL HCL 20 MG PO TABS: 20.0000 mg | ORAL_TABLET | Freq: Two times a day (BID) | ORAL | 0 refills | Status: DC

## 2019-01-21 MED ORDER — VITAMIN B-1 100 MG PO TABS
250.0000 mg | ORAL_TABLET | Freq: Every day | ORAL | Status: DC
  Administered 2019-01-21: 250 mg via ORAL
  Filled 2019-01-21: qty 3

## 2019-01-21 NOTE — Discharge Summary (Signed)
[facility] Discharge Summary  Patient name: [name] Medical record number: [id number] Date of birth: 01/12/1981 Age: 38 y.o. Gender: male Date of Admission: 01/18/2019  Date of Discharge: 01/21/2019 Admitting Physician: Blane Ohara McDiarmid, MD  Primary Care Provider: Nicholes Rough, PA-C Consultants: GI  Indication for Hospitalization: UTI- bacteremia  Discharge Diagnoses/Problem List:  UTI Bacteremia  End stage liver failure Chronic Hep C EtOH cirrhosis Thrombocytopenia Hypokalemia Malnutrition   Disposition: discharge home with home PT/OT  Discharge Condition: improved  Discharge Exam:  General: NAD, pleasant, able to participate in exam Cardiac: RRR, normal heart sounds, no murmurs. 2+ radial and PT pulses bilaterally Respiratory: CTAB, normal effort, No wheezes, rales or rhonchi Abdomen: soft, nontender, nondistended, no hepatic or splenomegaly, +BS Extremities: no edema or cyanosis. WWP. Skin: warm and dry, no rashes noted Neuro: alert and oriented x4, no focal deficits Psych: Normal affect and mood  Brief Hospital Course:  Urosepsis- urine and blood positive for E. coli with pan-sensitivities. Patient was initially treated with IV antibiotic and transitioned to PO amoxicillin prior to discharge for an additional 5 days. (7 days total of treatment). On day of discharge, patient was afebrile with stable vital signs and able to ambulate and tolerate a normal diet. He was alert and oriented and appeared to be at his mental baseline. Liver disease- patient continued to have elevated bilirubin and thrombocytopenia during admission. GI was consulted and recommended discontinuing steroids due to poor response. Attempted to r/u SBP but patient had no ascites. His rifaximin and lactulose were continued throughout admission. On day of discharge, patient complained of 4+ loose stools per day and requested decrease in lactulose dose. Switched from TID  to BID dose. K+ and Mg++ were wnl on day of discharge s/p repletion. PT/INR remained stable throughout admission. (see specific lab values below) PT/OT evaluation recommended continuing home health therapy.  Issues for Follow Up:  1. Patient will need to follow up with GI for continued management of his liver disease 2. Patient will have follow up with home health PT  Significant Procedures:  none  Significant Labs and Imaging:  Recent Labs  Lab 01/19/19 0536 01/20/19 1014 01/21/19 0729  WBC 9.9 11.5* 13.8*  HGB 13.1 13.6 13.8  HCT 38.1* 40.8 40.9  PLT 46* 40* 47*   Recent Labs  Lab 01/18/19 1746 01/19/19 0006 01/19/19 0536 01/20/19 1014 01/21/19 0729  NA 139 138 139 141 140  K 3.3* 3.2* 3.3* 3.7 4.6  CL 102 104 104 109 107  CO2 27 26 23 23 24   GLUCOSE 120* 111* 98 132* 99  BUN 16 15 15 16 20   CREATININE 0.88 0.83 0.84 1.08 1.24  CALCIUM 9.0 8.6* 8.5* 8.5* 8.7*  MG  --   --  1.9  --  2.4  ALKPHOS 86 89 90 85 92  AST 142* 129* 133* 125* 129*  ALT 99* 93* 92* 96* 102*  ALBUMIN 2.1* 1.9* 1.8* 1.7* 1.8*   Bilirubin     Component Value Date/Time   BILITOT 41.4 (HH) 01/21/2019 0729   BILIDIR 22.6 (H) 01/19/2019 0006   IBILI 13.6 (H) 01/19/2019 0006   Mg 2.4 PT/INR  Results/Tests Pending at Time of Discharge:  none  Discharge Medications:  Allergies as of 01/21/2019      Reactions   Acetaminophen Other (See Comments)   Due to liver issues      Medication List    STOP taking these medications   ibuprofen 200 MG tablet  Commonly known as:  ADVIL   predniSONE 20 MG tablet Commonly known as:  DELTASONE     TAKE these medications   amoxicillin 875 MG tablet Commonly known as:  AMOXIL Take 1 tablet (875 mg total) by mouth 2 (two) times daily for 5 days.   feeding supplement (ENSURE ENLIVE) Liqd Take 237 mLs by mouth 2 (two) times daily between meals.   folic acid 1 MG tablet Commonly known as:  FOLVITE Take 1 tablet (1 mg total) by mouth  daily. Start taking on:  January 22, 2019   lactulose 10 GM/15ML solution Commonly known as:  CHRONULAC Take 45 mLs (30 g total) by mouth 2 (two) times daily.   multivitamin with minerals Tabs tablet Take 1 tablet by mouth daily.   pantoprazole 40 MG tablet Commonly known as:  PROTONIX Take 1 tablet (40 mg total) by mouth every morning.   propranolol 20 MG tablet Commonly known as:  INDERAL Take 1 tablet (20 mg total) by mouth 2 (two) times daily.   rifaximin 550 MG Tabs tablet Commonly known as:  XIFAXAN Take 1 tablet (550 mg total) by mouth 2 (two) times daily for 20 days.   tetrahydrozoline-zinc 0.05-0.25 % ophthalmic solution Commonly known as:  VISINE-AC Place 2 drops into both eyes daily as needed (itchy eyes).   thiamine 250 MG tablet Take 1 tablet (250 mg total) by mouth daily for 4 days. Start taking on:  January 22, 2019       Discharge Instructions: Please refer to Patient Instructions section of EMR for full details.  Patient was counseled important signs and symptoms that should prompt return to medical care, changes in medications, dietary instructions, activity restrictions, and follow up appointments.   Follow-Up Appointments:   Richarda Osmond, DO 01/21/2019, 2:03 PM PGY-1, [place]

## 2019-01-21 NOTE — Progress Notes (Signed)
DISCHARGE NOTE  [name] to be discharged Home per MD order. Patient verbalized understanding.  Skin clean, dry and intact without evidence of skin break down, no evidence of skin tears noted. IV catheter discontinued intact. Site without signs and symptoms of complications. Dressing and pressure applied. Pt denies pain at the site currently. No complaints noted.  Patient free of lines, drains, and wounds.   Discharge packet assembled. An After Visit Summary (AVS) was printed and given to the patient. Patient escorted via wheelchair and discharged to home via private auto.  Babs Sciara, RN

## 2019-01-21 NOTE — Progress Notes (Signed)
CRITICAL VALUE ALERT  Critical Value:  Bilirubin 41.4 mg/dl  Date & Time Notied:  01/21/19, 9:40am  Provider Notified: family medicine  Orders Received/Actions taken: awaiting

## 2019-01-21 NOTE — Discharge Instructions (Signed)
You were treated for an infection in your urine and blood. You will continue to take a medication for another 5 days to treat. Your lactulose has decreased to 2 times per day. We have stopped your steroid. Please call your GI doctor Monday for follow up. Physical therapy will be following up with you for treatments at your home.

## 2019-01-22 LAB — VITAMIN B1: Vitamin B1 (Thiamine): 246 nmol/L — ABNORMAL HIGH (ref 66.5–200.0)

## 2019-01-23 ENCOUNTER — Telehealth: Payer: Self-pay | Admitting: Internal Medicine

## 2019-01-23 ENCOUNTER — Other Ambulatory Visit: Payer: Self-pay | Admitting: Internal Medicine

## 2019-01-23 ENCOUNTER — Encounter: Payer: Self-pay | Admitting: Internal Medicine

## 2019-01-23 ENCOUNTER — Other Ambulatory Visit: Payer: Self-pay

## 2019-01-23 DIAGNOSIS — Z515 Encounter for palliative care: Secondary | ICD-10-CM

## 2019-01-23 LAB — CULTURE, BLOOD (ROUTINE X 2)
Culture: NO GROWTH
Special Requests: ADEQUATE

## 2019-01-23 NOTE — Telephone Encounter (Signed)
I have spoken to patient's brother/caregiver, [name] to advise that Dr Hilarie Fredrickson would like patient to have his labwork on Wednesday now (orders already in Epic). In addition, I have mailed [facility] Patient assistance forms for Xifaxan over to [facility] new mailing address at the request of [name] as [name] is now living with his girlfriend, [name] (phone number [phone]) at [address], [place], [place] [address].   [name] requests to be included in Zoom visit on 01/26/19 if okay with [name] since he has been involved in his care most recently. He has questions such as how long would [name] need to be without alcohol before he could be considered for liver transplant etc.

## 2019-01-23 NOTE — Telephone Encounter (Signed)
Patient brother called would like to know if the patient would still need to come in for labs today. He just got out the ED.

## 2019-01-23 NOTE — Progress Notes (Signed)
April 27th, 2020 [facility] Palliative Care Consult Note Telephone: [phone]  Fax: [phone]  *Due to the current COVID-19 infection/crises, the patient and family prefer, and have given their verbal consent for, a provider visit via telehealth, from my office. HIPPA policies of confidentially were discussed.  PATIENT NAME: [name] DOB: 1981/08/12 MRN: [id number]  (home) [address], [place]  PRIMARY CARE PROVIDER:   Nicholes Rough, PA-C  REFERRING PROVIDER:  Nicholes Rough, PA-C [address] [place], [place] [address]  RESPONSIBLE PARTY:  (brother and [name] [facility]  [address] Lady [place] [phone]. (girlfriend) [name] [phone].   Assessment / Plan:  1. Goals of care: "To live as long as I can".        A. Stop drinking: Discussed AA; he hasn't thought of tapping into this, but is open to consideration.  - I dropped off some donated AA books, to patient's home: "Alcoholics Anonymous" (the "big" book"), "Mini Alcoholics Anonymous" Book, "Twenty-Four Hours a Day", "Beginners'Book; Getting and Staying Sober in [place]", "Twelve Steps and Twelve Tradiditons", and "Drop the Rock; steps 6 &7".       B. Exercise so as to decrease fatigue from long hospitalization / illnesses: discussed daily walking regimen based on time, and increasing 1 min/day. He has an acre lot on his property he can use, and dogs that can go with him. I dropped off a list of strength building exercises that he could utilize. He spends as much time as he can outside.        C. Maintain good relationships/environment and f/u with health care providers:  -Alternately staying with his girlfriend [name], and his brother [name]. [name] was present at the time of my visit.  -Had f/u GI visit scheduled for next week, and plans to f/u with his PCP Jose Rough PA-C thereafter.  2. Symptom management:        A. Nausea: currently none. Prior use of Zofran without much  effect.        B. Constipaion: under good control with current use of Lactulose bid  3. Advanced Care Planning: discussed advanced cardiac life support and level of hospital care. Patient desires to utilize all available therapies and treatments. He has discussed this with his brother [name], who is his HCPOA  4. Follow up NP visit: will call to schedule in 3 weeks.   I spent 60 minutes providing this consultation,  from 3:30pm to 4:30pm. More than 50% of the time in this consultation was spent coordinating communication.   HISTORY OF PRESENT ILLNESS:  [name] is a 38 y.o.  male with h/o acute on chronic alcoholic hepatitis/acute on chronic liver failure (coagulopathy, thrombocytopenia, portal hypertension with esophegeal varices; not transplant candidate 2/2 ongoing ETOH abuse. MELD NA 33), hx of treated hep C. Recent hospitalization 4/22-4/25/2020 for UTI-bacteremia. Palliative Care was asked to help address goals of care.   CODE STATUS: Full Code  PPS: 70% HOSPICE ELIGIBILITY/DIAGNOSIS: TBD  PAST MEDICAL HISTORY:  Past Medical History:  Diagnosis Date  . Alcoholic cirrhosis ([place]) 10/5850   confirmed by liver biopsy.    . Cellulitis 10/2016   right leg  . 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 ETOH abuse   . Hepatitis C 2015   Hepatitis C. Rx Solvaldi/Ribaviran in 2015, undetectable Hep C 03/2015 (see Care Everywhere) but did not have sustained response. Began 12 weeks Mayvret 10/2017 by [facility] [place], [place]  PA-C  . Hepatitis, alcoholic 54/5625   treated with Prednisolone.    . Pancreatitis, alcoholic, acute 63/8937  . Paraspinal abscess ([place])   . Thrombocytopenia ([place]) 25/2018    SOCIAL HX:  Social History   Tobacco Use  . Smoking status: Former Smoker    Last attempt to quit: 01/04/2017    Years since quitting: 2.0  . Smokeless tobacco: Never Used  . Tobacco comment: was a social smoker- maybe 5 a week  Substance Use  Topics  . Alcohol use: Yes    Comment: occasionally    ALLERGIES:  Allergies  Allergen Reactions  . Acetaminophen Other (See Comments)    Due to liver issues     PERTINENT MEDICATIONS:  Outpatient Encounter Medications as of 01/23/2019  Medication Sig  . amoxicillin (AMOXIL) 875 MG tablet Take 1 tablet (875 mg total) by mouth 2 (two) times daily for 5 days.  . feeding supplement, ENSURE ENLIVE, (ENSURE ENLIVE) LIQD Take 237 mLs by mouth 2 (two) times daily between meals.  . folic acid (FOLVITE) 1 MG tablet Take 1 tablet (1 mg total) by mouth daily.  Marland Kitchen lactulose (CHRONULAC) 10 GM/15ML solution Take 45 mLs (30 g total) by mouth 2 (two) times daily.  . Multiple Vitamin (MULTIVITAMIN WITH MINERALS) TABS tablet Take 1 tablet by mouth daily.  . pantoprazole (PROTONIX) 40 MG tablet Take 1 tablet (40 mg total) by mouth every morning.  . propranolol (INDERAL) 20 MG tablet Take 1 tablet (20 mg total) by mouth 2 (two) times daily.  . rifaximin (XIFAXAN) 550 MG TABS tablet Take 1 tablet (550 mg total) by mouth 2 (two) times daily for 20 days.  Marland Kitchen tetrahydrozoline-zinc (VISINE-AC) 0.05-0.25 % ophthalmic solution Place 2 drops into both eyes daily as needed (itchy eyes).  . thiamine 250 MG tablet Take 1 tablet (250 mg total) by mouth daily for 4 days.   No facility-administered encounter medications on file as of 01/23/2019.     PHYSICAL EXAM:  Limited exam as tele-health visit Very pleasantly conversant; A & O x 3. Need to be corrected on some information (exact street address) by his girlfriend [place], who was in attendance at time of visit. Well nourished, no shortness of breath. Neuro: grossly non-foacl. Reports mild scleral jaundice.  Julianne Handler, NP

## 2019-01-24 ENCOUNTER — Telehealth: Payer: Self-pay | Admitting: Internal Medicine

## 2019-01-24 NOTE — Telephone Encounter (Signed)
(  documentation of mailed items and accompanying note:  April 27th, 2020 Hi [name], It was good to have had a chance to meet you and Hope ??! 1) Please find in this [employer] bag, a bunch of books from [employer]. I hope you get a chance to thumb through these. I can ask a representative from the group to contact you if you would like more specific information. Just send me a text and I will get on it.  I also included a schedule of the meetings in [place]. Sometimes the schedules change, but if you just google "AA meetings in [place]" you can find the latest updated times and places where they are meeting. 2) I also tucked in a list of exercises that you can try. Keep up the good work with your walking!! You might keep track of the distance, or time, you go each day, and try to increase it on a weekly basis. 3) Don't forget your big goal: To Live a Longer Life!! And your plans to do this: Stop drinking (check into AA/book readings, ? sponsor), exercise, keep up with your medical appointments, and maintain constant contact with those you love and who love you ??.  4) Make sure that every day, you are doing SOMETHING on each of your plans, to achieve your big goal. I look forward to touching base with you, on our next tele-medicine visit. I will contact Hope or [name] the week of May 17th to schedule. If you don't hear from me, please reach out. Sometimes I am not careful enough in my follow up phone calls. Bye for now! Violeta Gelinas NP-C [phone]

## 2019-01-25 ENCOUNTER — Encounter: Payer: Self-pay | Admitting: *Deleted

## 2019-01-25 ENCOUNTER — Other Ambulatory Visit (INDEPENDENT_AMBULATORY_CARE_PROVIDER_SITE_OTHER): Payer: No Typology Code available for payment source

## 2019-01-25 DIAGNOSIS — K7010 Alcoholic hepatitis without ascites: Secondary | ICD-10-CM

## 2019-01-25 LAB — COMPREHENSIVE METABOLIC PANEL
ALT: 93 U/L — ABNORMAL HIGH (ref 0–53)
AST: 131 U/L — ABNORMAL HIGH (ref 0–37)
Albumin: 2.4 g/dL — ABNORMAL LOW (ref 3.5–5.2)
Alkaline Phosphatase: 130 U/L — ABNORMAL HIGH (ref 39–117)
BUN: 23 mg/dL (ref 6–23)
CO2: 26 mEq/L (ref 19–32)
Calcium: 8 mg/dL — ABNORMAL LOW (ref 8.4–10.5)
Chloride: 99 mEq/L (ref 96–112)
Creatinine, Ser: 1.65 mg/dL — ABNORMAL HIGH (ref 0.40–1.50)
GFR: 46.9 mL/min — ABNORMAL LOW (ref >60.00)
Glucose, Bld: 140 mg/dL — ABNORMAL HIGH (ref 70–99)
Potassium: 4.2 mEq/L (ref 3.5–5.1)
Sodium: 131 mEq/L — ABNORMAL LOW (ref 135–145)
Total Bilirubin: 30.2 mg/dL — ABNORMAL HIGH (ref 0.2–1.2)
Total Protein: 5.5 g/dL — ABNORMAL LOW (ref 6.0–8.3)

## 2019-01-25 LAB — CBC WITH DIFFERENTIAL/PLATELET
Basophils Absolute: 0.1 10*3/uL (ref 0.0–0.1)
Basophils Relative: 0.6 % (ref 0.0–3.0)
Eosinophils Absolute: 0.2 10*3/uL (ref 0.0–0.7)
Eosinophils Relative: 1.8 % (ref 0.0–5.0)
HCT: 40.7 % (ref 39.0–52.0)
Hemoglobin: 13.8 g/dL (ref 13.0–17.0)
Lymphocytes Relative: 9.9 % — ABNORMAL LOW (ref 12.0–46.0)
Lymphs Abs: 1.1 10*3/uL (ref 0.7–4.0)
MCHC: 33.9 g/dL (ref 30.0–36.0)
MCV: 105.1 fl — ABNORMAL HIGH (ref 78.0–100.0)
Monocytes Absolute: 1.2 10*3/uL — ABNORMAL HIGH (ref 0.1–1.0)
Monocytes Relative: 11.6 % (ref 3.0–12.0)
Neutro Abs: 8.1 10*3/uL — ABNORMAL HIGH (ref 1.4–7.7)
Neutrophils Relative %: 76.1 % (ref 43.0–77.0)
Platelets: 48 10*3/uL — CL (ref 150.0–400.0)
RBC: 3.88 Mil/uL — ABNORMAL LOW (ref 4.22–5.81)
RDW: 18.3 % — ABNORMAL HIGH (ref 11.5–15.5)
WBC: 10.6 10*3/uL — ABNORMAL HIGH (ref 4.0–10.5)

## 2019-01-25 LAB — PROTIME-INR
INR: 3.3 ratio — ABNORMAL HIGH (ref 0.8–1.0)
Prothrombin Time: 37.4 s — ABNORMAL HIGH (ref 9.6–13.1)

## 2019-01-25 LAB — AMMONIA: Ammonia: 33 umol/L (ref 11–35)

## 2019-01-26 ENCOUNTER — Ambulatory Visit (INDEPENDENT_AMBULATORY_CARE_PROVIDER_SITE_OTHER): Payer: No Typology Code available for payment source | Admitting: Internal Medicine

## 2019-01-26 ENCOUNTER — Other Ambulatory Visit: Payer: Self-pay

## 2019-01-26 ENCOUNTER — Encounter: Payer: Self-pay | Admitting: Internal Medicine

## 2019-01-26 VITALS — Ht 71.0 in | Wt 251.0 lb

## 2019-01-26 DIAGNOSIS — K7200 Acute and subacute hepatic failure without coma: Secondary | ICD-10-CM | POA: Diagnosis not present

## 2019-01-26 DIAGNOSIS — K7290 Hepatic failure, unspecified without coma: Secondary | ICD-10-CM | POA: Diagnosis not present

## 2019-01-26 DIAGNOSIS — K7010 Alcoholic hepatitis without ascites: Secondary | ICD-10-CM

## 2019-01-26 DIAGNOSIS — K7030 Alcoholic cirrhosis of liver without ascites: Secondary | ICD-10-CM | POA: Diagnosis not present

## 2019-01-26 DIAGNOSIS — Z8619 Personal history of other infectious and parasitic diseases: Secondary | ICD-10-CM

## 2019-01-26 DIAGNOSIS — K7682 Hepatic encephalopathy: Secondary | ICD-10-CM

## 2019-01-26 DIAGNOSIS — K766 Portal hypertension: Secondary | ICD-10-CM

## 2019-01-26 MED ORDER — LACTULOSE 10 GM/15ML PO SOLN: 10.0000 g | Freq: Two times a day (BID) | ORAL | 0 refills | Status: AC

## 2019-01-26 NOTE — Progress Notes (Signed)
Subjective:    Patient ID: [name], male    DOB: 1980/11/25, 38 y.o.   MRN: [id number]  This service was provided via telemedicine.   Zoom platform with A/V communication The patient was located at home The provider was located in provider's GI office. The patient did consent to this telephone visit and is aware of possible charges through their insurance for this visit.   The persons participating in this telemedicine service were the patient, his brother [name], his mother, Quintin Alto, [place] and I. Time spent on call: 27 min   HPI [name] is a 38 year old male with a history of alcohol abuse and recent hospitalization with decompensated liver disease due to acute alcoholic hepatitis, history of viral hepatitis C, history of alcohol-related pancreatitis, cirrhosis who is seen in follow-up.  He is seen by Zoom today with his family.  He was hospitalized earlier this month with severely decompensated liver disease.  He had jaundice, encephalopathy and alcohol withdrawal.  He was briefly moved to the ICU for alcohol withdrawal.  Subsequently he has been discharged and is living with his girlfriend with close involvement with his family.  He has not had any further alcohol since his hospitalization.  He was also treated for urosepsis during this hospitalization and is completing amoxicillin today.  Of note he was previously seen at [facility] but has not been seen there in over a year.  He did take [place] for at least 2 months for genotype 2 hepatitis C. I am unclear if he completed therapy or achieved SVR.  He continues to be deeply jaundiced and is feeling tired.  He has had some issues with mild agitation but no recent confusion.  Confusion was an issue shortly after hospitalization.  He has had some mild stomach discomfort and his stomach feels slightly swollen.  He is not having/denying lower extremity edema.  He reports his appetite has been  good.  No nausea or vomiting.  He is having frequent loose stools as many as 10 times per day and night.  He is taking lactulose 45 mL's twice a day.  He starting wearing depends because he has been having accidents.  He denies bleeding including gingival bleeding, blood in his stool and melena.  He is taking lactulose as above, rifaximin 550 mg twice daily, pantoprazole 40 mg daily and propranolol 20 mg twice daily.  He is trying to follow a low-sodium diet and trying to drink Ensure 1 or 2 times a day though this is expensive for him.  He has had absolutely no alcohol and reports he never plans to drink again.  He has a support of his family to this regard.  He had labs done yesterday which show a stable hyponatremia with a sodium of 131.  Bilirubin has declined somewhat, but is still 30.  INR is up to 3.3.  He has thrombocytopenia.  Hemoglobin is normal.  Mildly elevated white count.  See below for additional labs.  Several weeks ago he had an ultrasound which did not show ascites at that time.  Fatty liver has been seen but no evidence for [place].  He has not had cross-sectional imaging in over a year.  Of note he was treated for over a week with prednisolone during hospitalization but this was stopped as he did not appear to be making improvement after serial monitoring.  He is no longer on steroid therapy.  Review of Systems As per HPI, otherwise negative  Current Medications, Allergies, Past Medical History, Past Surgical History, Family History and Social History were reviewed in [employer] record.     Objective:   Physical Exam No PE, virtual visit  CBC    Component Value Date/Time   WBC 10.6 (H) 01/25/2019 1017   RBC 3.88 (L) 01/25/2019 1017   HGB 13.8 01/25/2019 1017   HCT 40.7 01/25/2019 1017   PLT 48.0 Repeated and verified X2. (LL) 01/25/2019 1017   MCV 105.1 (H) 01/25/2019 1017   MCH 34.8 (H) 01/21/2019 0729   MCHC 33.9 01/25/2019 1017   RDW 18.3 (H)  01/25/2019 1017   LYMPHSABS 1.1 01/25/2019 1017   MONOABS 1.2 (H) 01/25/2019 1017   EOSABS 0.2 01/25/2019 1017   BASOSABS 0.1 01/25/2019 1017   CMP     Component Value Date/Time   NA 131 (L) 01/25/2019 1017   K 4.2 01/25/2019 1017   CL 99 01/25/2019 1017   CO2 26 01/25/2019 1017   GLUCOSE 140 (H) 01/25/2019 1017   BUN 23 01/25/2019 1017   CREATININE 1.65 (H) 01/25/2019 1017   CREATININE 0.52 (L) 04/07/2017 1635   CALCIUM 8.0 (L) 01/25/2019 1017   PROT 5.5 (L) 01/25/2019 1017   ALBUMIN 2.4 (L) 01/25/2019 1017   AST 131 (H) 01/25/2019 1017   ALT 93 (H) 01/25/2019 1017   ALKPHOS 130 (H) 01/25/2019 1017   BILITOT 30.2 (H) 01/25/2019 1017   GFRNONAA >60 01/21/2019 0729   GFRAA >60 01/21/2019 0729   Lab Results  Component Value Date   INR 3.3 (H) 01/25/2019   INR 2.8 (H) 01/21/2019   INR 2.6 (H) 01/17/2019   CT ABDOMEN AND PELVIS WITH CONTRAST   TECHNIQUE: Multidetector CT imaging of the abdomen and pelvis was performed using the standard protocol following bolus administration of intravenous contrast.   CONTRAST:  138mL ISOVUE-300 IOPAMIDOL (ISOVUE-300) INJECTION 61%   COMPARISON:  Ultrasound of the abdomen 10/29/2016.   FINDINGS: Lower chest: No acute abnormality.   Hepatobiliary: Hepatic steatosis. Mild gallbladder wall thickening noted previously. No visible calculi.   Pancreas: Unremarkable. No pancreatic ductal dilatation or surrounding inflammatory changes.   Spleen: Mild splenomegaly.  No focal abnormality.   Adrenals/Urinary Tract: Adrenal glands are unremarkable. Kid no acute intra-abdominal or pelvic abnormality.   Kidneys are normal, without renal calculi, focal lesion, or hydronephrosis. Bladder is unremarkable.   Stomach/Bowel: Stomach is within normal limits. Appendix appears normal. No evidence of bowel wall thickening, distention, or inflammatory changes. Uncomplicated appearing sigmoid diverticulosis.   Vascular/Lymphatic: No significant  vascular findings are present. No enlarged abdominal or pelvic lymph nodes.   Reproductive: Prostate is unremarkable.   Other: Small umbilical hernia contains only fat.  No free fluid.   Musculoskeletal: No acute or significant osseous findings. Prior RIGHT laminectomy at L4-5.   IMPRESSION: No evidence for bowel obstruction or other acute findings.   Hepatic steatosis, mild gallbladder wall thickening similar to prior ultrasound.     Electronically Signed   By: Staci Righter M.D.   On: 11/10/2017 14:34  ULTRASOUND ABDOMEN LIMITED RIGHT UPPER QUADRANT   COMPARISON:  None.   FINDINGS: Gallbladder:   Gallbladder is partially distended with thickened irregular wall measuring up to 11 mm. Minimal pericholecystic fluid is noted. No definitive cholelithiasis is seen. Gallbladder sludge is noted.   Common bile duct:   Diameter: 4.7 mm   Liver:   Diffuse increased echogenicity is noted consistent with fatty infiltration. No focal mass is noted. Poor flow is reversed  and hepatofugal.   IMPRESSION: Increased gallbladder wall thickening with mild pericholecystic fluid. This may represent acute cholecystitis in the appropriate clinical setting. HIDA scan may be helpful to assess for gallbladder function.   Fatty liver.   Hepatofugal portal flow.     Electronically Signed   By: Inez Catalina M.D.   On: 01/10/2019 00:53  LIMITED ABDOMEN ULTRASOUND FOR ASCITES   TECHNIQUE: Limited ultrasound survey for ascites was performed in all four abdominal quadrants.   COMPARISON:  Right upper quadrant abdominal ultrasound on 01/10/2019   FINDINGS: Survey of the abdominal cavity demonstrates no evidence of ascites.   IMPRESSION: No ascites identified in the peritoneal cavity.     Electronically Signed   By: Aletta Edouard M.D.   On: 01/19/2019 13:58 ULTRASOUND ABDOMEN LIMITED RIGHT UPPER QUADRANT   COMPARISON:  None.   FINDINGS: Gallbladder:   Gallbladder is  partially distended with thickened irregular wall measuring up to 11 mm. Minimal pericholecystic fluid is noted. No definitive cholelithiasis is seen. Gallbladder sludge is noted.   Common bile duct:   Diameter: 4.7 mm   Liver:   Diffuse increased echogenicity is noted consistent with fatty infiltration. No focal mass is noted. Poor flow is reversed and hepatofugal.   IMPRESSION: Increased gallbladder wall thickening with mild pericholecystic fluid. This may represent acute cholecystitis in the appropriate clinical setting. HIDA scan may be helpful to assess for gallbladder function.   Fatty liver.   Hepatofugal portal flow.     Electronically Signed   By: Inez Catalina M.D.   On: 01/10/2019 00:53   CT ABDOMEN AND PELVIS WITH CONTRAST   TECHNIQUE: Multidetector CT imaging of the abdomen and pelvis was performed using the standard protocol following bolus administration of intravenous contrast.   CONTRAST:  140mL ISOVUE-300 IOPAMIDOL (ISOVUE-300) INJECTION 61%   COMPARISON:  Ultrasound of the abdomen 10/29/2016.   FINDINGS: Lower chest: No acute abnormality.   Hepatobiliary: Hepatic steatosis. Mild gallbladder wall thickening noted previously. No visible calculi.   Pancreas: Unremarkable. No pancreatic ductal dilatation or surrounding inflammatory changes.   Spleen: Mild splenomegaly.  No focal abnormality.   Adrenals/Urinary Tract: Adrenal glands are unremarkable. Kid no acute intra-abdominal or pelvic abnormality.   Kidneys are normal, without renal calculi, focal lesion, or hydronephrosis. Bladder is unremarkable.   Stomach/Bowel: Stomach is within normal limits. Appendix appears normal. No evidence of bowel wall thickening, distention, or inflammatory changes. Uncomplicated appearing sigmoid diverticulosis.   Vascular/Lymphatic: No significant vascular findings are present. No enlarged abdominal or pelvic lymph nodes.   Reproductive: Prostate is  unremarkable.   Other: Small umbilical hernia contains only fat.  No free fluid.   Musculoskeletal: No acute or significant osseous findings. Prior RIGHT laminectomy at L4-5.   IMPRESSION: No evidence for bowel obstruction or other acute findings.   Hepatic steatosis, mild gallbladder wall thickening similar to prior ultrasound.     Electronically Signed   By: Staci Righter M.D.   On: 11/10/2017 14:34  EGD --[facility], 11/21/2017 Impression/Plan: No varices or signs of portal hypertension seen in his UGI tract. He had severe, ulcerative esophagitis along with evidence of retching gastropathy in his fundus. Small HH.       Assessment & Plan:  39 year old male with a history of alcohol abuse and recent hospitalization with decompensated liver disease due to acute alcoholic hepatitis, history of viral hepatitis C, history of alcohol-related pancreatitis, cirrhosis who is seen in follow-up.   1.  Alcoholic cirrhosis with portal hypertension/acute alcoholic  hepatitis --he carries the diagnosis of cirrhosis from his prior GI practice at [place].  Imaging has not shown definitively nodular liver but he certainly had acute alcoholic hepatitis and associated portal hypertension.  I expect he likely does have cirrhosis.  Either way he continues to have severely decompensated disease with a very high mortality.  I have explained this to him.  Hospice was recommended at hospitalization but my hope is that he will be able to recover.  This will require him to be off alcohol to have any chance of living.  He seems to understand this.  Again, he continues to have a very high mortality and his INR not improving concerns me greatly.  We will follow him closely.  Recommended the following: --Referral to [facility] liver clinic to establish care.  He understands that transplant is not immediately available but if he is able to survive past 6 months I would like for this to potentially be an option for him  in the future should it be needed --To prevent encephalopathy continue lactulose but decrease to 15 mL twice daily.  I explained that he needs to have 3-4 soft but formed stools a day.  If confusion is seen they should notify me immediately and we will have to increase doses again. --Continue rifaximin 550 mg twice daily.  We gave samples.  He has applied for assistance with the drug company, he received this paperwork from [place] and we will fill it out as soon as possible --Continue propranolol 20 mg twice daily --Low-sodium diet --Continue complete alcohol abstinence --I will see him back in virtual visit in less than 2 weeks on 02/06/2019, labs Friday before this visit --We will need to consider repeating upper endoscopy given his decompensation though he is already on beta-blocker therapy, thus this can likely be deferred for now.  He did not have varices at the time of his last EGD in February 2019, see above  2.  History of hepatitis C --treated with antiviral therapy last year.  With his next labs I will check a viral load to ensure that he has achieved cure  3.  History of esophagitis/portal gastropathy --continue daily PPI, pantoprazole 40 mg daily

## 2019-01-26 NOTE — Patient Instructions (Addendum)
Please decrease your your lactulose to 15 ml (1 tablespoon) twice daily. You should having at least 3-4 bowel movements daily while on this medication. If you are NOT having at least 3-4 bowel movements daily, please let us know this.   Please follow a low salt diet, 2 grams or less daily.   Make sure to get a good amount of protein in your diet every day.  Carnation Instant Breakfast is a good option for extra nutrition!  You have been scheduled for a follow up ZOOM visit with Dr Hilarie Fredrickson on 02/06/19 at 4:00 pm.  Your provider has requested that you go to the basement level for lab work on 02/03/19 any time between 8 am and 4 pm. Press "B" on the elevator. The lab is located at the first door on the left as you exit the elevator.  We have sent a referral to [facility]. You should receive a call from their office with an appointment time, date and location shortly. If you do NOT hear from them, please let us know by calling our office at [phone].

## 2019-01-26 NOTE — Addendum Note (Signed)
Addended by: Larina Bras on: 01/26/2019 03:34 PM   Modules accepted: Orders

## 2019-01-30 ENCOUNTER — Other Ambulatory Visit: Payer: Self-pay

## 2019-01-30 ENCOUNTER — Telehealth: Payer: Self-pay | Admitting: Internal Medicine

## 2019-01-30 DIAGNOSIS — K7010 Alcoholic hepatitis without ascites: Secondary | ICD-10-CM

## 2019-01-30 DIAGNOSIS — K7031 Alcoholic cirrhosis of liver with ascites: Secondary | ICD-10-CM

## 2019-01-30 MED ORDER — SPIRONOLACTONE 50 MG PO TABS: 50.0000 mg | ORAL_TABLET | Freq: Every day | ORAL | 3 refills | Status: DC

## 2019-01-30 MED ORDER — FUROSEMIDE 20 MG PO TABS: 20.0000 mg | ORAL_TABLET | Freq: Every day | ORAL | 3 refills | Status: DC

## 2019-01-30 NOTE — Telephone Encounter (Signed)
Pt brother called in wanting to speak with the nurse. He stated that they were advised by the doctor to call back if the pt leg started to swell and it has. Please call

## 2019-01-30 NOTE — Telephone Encounter (Signed)
Pt scheduled for IR [facility] at [facility] 02/01/19@10am , pt to arrive there at 9:45am. Orders in epic, pts brother aware of appt and will inform pt. Scripts sent to pharmacy. Pt to start and have labs done in 1 week. Orders in epic.

## 2019-01-30 NOTE — Telephone Encounter (Signed)
Okay for large-volume paracentesis up to 6 L with 75 g of IV albumin Please send for cell count and culture  Would like to start low-dose diuretic therapy but will need to pay very close attention to renal function We checked labs last week He can start furosemide 20 mg and Aldactone 50 mg daily He will need a CMP within 7 days of starting these diuretics  Thank you

## 2019-01-30 NOTE — Telephone Encounter (Signed)
Pts brother called and states [name] sent him a text message last night stating that his "tummy and feet are swollen up again like they were before and he thinks he needs to have fluid sucked out again." Brother calling to see if Dr. Hilarie Fredrickson wants [name] to have another paracentesis. Please advise.

## 2019-02-01 ENCOUNTER — Ambulatory Visit (HOSPITAL_COMMUNITY)
Admission: RE | Admit: 2019-02-01 | Discharge: 2019-02-01 | Disposition: A | Discharge status: Home / Self Care | Payer: No Typology Code available for payment source | Source: Ambulatory Visit | Attending: Internal Medicine | Admitting: Internal Medicine

## 2019-02-01 ENCOUNTER — Other Ambulatory Visit: Payer: Self-pay

## 2019-02-01 ENCOUNTER — Other Ambulatory Visit: Payer: Self-pay | Admitting: Internal Medicine

## 2019-02-01 DIAGNOSIS — K7010 Alcoholic hepatitis without ascites: Secondary | ICD-10-CM

## 2019-02-01 MED ORDER — LIDOCAINE HCL 1 % IJ SOLN
INTRAMUSCULAR | Status: AC
  Filled 2019-02-01: qty 20

## 2019-02-01 NOTE — Progress Notes (Signed)
  I was present during limited US of the abdomen to evaluate for ascites for possible paracentesis.  There was no ascites seen.  Opaline Reyburn S Chayla Shands PA-C 02/01/2019 10:50 AM

## 2019-02-03 ENCOUNTER — Encounter: Payer: Self-pay | Admitting: *Deleted

## 2019-02-06 ENCOUNTER — Other Ambulatory Visit (INDEPENDENT_AMBULATORY_CARE_PROVIDER_SITE_OTHER): Payer: No Typology Code available for payment source

## 2019-02-06 ENCOUNTER — Ambulatory Visit: Payer: No Typology Code available for payment source | Admitting: Internal Medicine

## 2019-02-06 ENCOUNTER — Other Ambulatory Visit: Payer: Self-pay | Admitting: Student in an Organized Health Care Education/Training Program

## 2019-02-06 DIAGNOSIS — K7031 Alcoholic cirrhosis of liver with ascites: Secondary | ICD-10-CM | POA: Diagnosis not present

## 2019-02-06 LAB — COMPREHENSIVE METABOLIC PANEL
ALT: 43 U/L (ref 0–53)
AST: 88 U/L — ABNORMAL HIGH (ref 0–37)
Albumin: 2.1 g/dL — ABNORMAL LOW (ref 3.5–5.2)
Alkaline Phosphatase: 144 U/L — ABNORMAL HIGH (ref 39–117)
BUN: 42 mg/dL — ABNORMAL HIGH (ref 6–23)
CO2: 27 mEq/L (ref 19–32)
Calcium: 8.1 mg/dL — ABNORMAL LOW (ref 8.4–10.5)
Chloride: 97 mEq/L (ref 96–112)
Creatinine, Ser: 2.25 mg/dL — ABNORMAL HIGH (ref 0.40–1.50)
GFR: 32.78 mL/min — ABNORMAL LOW (ref >60.00)
Glucose, Bld: 89 mg/dL (ref 70–99)
Potassium: 3.4 mEq/L — ABNORMAL LOW (ref 3.5–5.1)
Sodium: 134 mEq/L — ABNORMAL LOW (ref 135–145)
Total Bilirubin: 21.3 mg/dL — ABNORMAL HIGH (ref 0.2–1.2)
Total Protein: 5.5 g/dL — ABNORMAL LOW (ref 6.0–8.3)

## 2019-02-07 ENCOUNTER — Ambulatory Visit (INDEPENDENT_AMBULATORY_CARE_PROVIDER_SITE_OTHER): Payer: No Typology Code available for payment source | Admitting: Internal Medicine

## 2019-02-07 ENCOUNTER — Other Ambulatory Visit (INDEPENDENT_AMBULATORY_CARE_PROVIDER_SITE_OTHER): Payer: No Typology Code available for payment source

## 2019-02-07 ENCOUNTER — Other Ambulatory Visit: Payer: Self-pay

## 2019-02-07 DIAGNOSIS — K625 Hemorrhage of anus and rectum: Secondary | ICD-10-CM

## 2019-02-07 DIAGNOSIS — K7010 Alcoholic hepatitis without ascites: Secondary | ICD-10-CM

## 2019-02-07 DIAGNOSIS — K766 Portal hypertension: Secondary | ICD-10-CM | POA: Diagnosis not present

## 2019-02-07 DIAGNOSIS — K7290 Hepatic failure, unspecified without coma: Secondary | ICD-10-CM

## 2019-02-07 DIAGNOSIS — K7030 Alcoholic cirrhosis of liver without ascites: Secondary | ICD-10-CM | POA: Diagnosis not present

## 2019-02-07 DIAGNOSIS — K7682 Hepatic encephalopathy: Secondary | ICD-10-CM

## 2019-02-07 DIAGNOSIS — K7200 Acute and subacute hepatic failure without coma: Secondary | ICD-10-CM

## 2019-02-07 DIAGNOSIS — Z8619 Personal history of other infectious and parasitic diseases: Secondary | ICD-10-CM

## 2019-02-07 LAB — CBC WITH DIFFERENTIAL/PLATELET
Basophils Absolute: 0.2 10*3/uL — ABNORMAL HIGH (ref 0.0–0.1)
Basophils Relative: 1.5 % (ref 0.0–3.0)
Eosinophils Absolute: 0.3 10*3/uL (ref 0.0–0.7)
Eosinophils Relative: 3 % (ref 0.0–5.0)
HCT: 35.7 % — ABNORMAL LOW (ref 39.0–52.0)
Hemoglobin: 12.3 g/dL — ABNORMAL LOW (ref 13.0–17.0)
Lymphocytes Relative: 13.5 % (ref 12.0–46.0)
Lymphs Abs: 1.5 10*3/uL (ref 0.7–4.0)
MCHC: 34.3 g/dL (ref 30.0–36.0)
MCV: 103.2 fl — ABNORMAL HIGH (ref 78.0–100.0)
Monocytes Absolute: 1.4 10*3/uL — ABNORMAL HIGH (ref 0.1–1.0)
Monocytes Relative: 12.5 % — ABNORMAL HIGH (ref 3.0–12.0)
Neutro Abs: 7.7 10*3/uL (ref 1.4–7.7)
Neutrophils Relative %: 69.5 % (ref 43.0–77.0)
Platelets: 40 10*3/uL — CL (ref 150.0–400.0)
RBC: 3.46 Mil/uL — ABNORMAL LOW (ref 4.22–5.81)
RDW: 15.6 % — ABNORMAL HIGH (ref 11.5–15.5)
WBC: 11.1 10*3/uL — ABNORMAL HIGH (ref 4.0–10.5)

## 2019-02-07 LAB — COMPREHENSIVE METABOLIC PANEL
ALT: 40 U/L (ref 0–53)
AST: 86 U/L — ABNORMAL HIGH (ref 0–37)
Albumin: 2 g/dL — ABNORMAL LOW (ref 3.5–5.2)
Alkaline Phosphatase: 139 U/L — ABNORMAL HIGH (ref 39–117)
BUN: 40 mg/dL — ABNORMAL HIGH (ref 6–23)
CO2: 28 mEq/L (ref 19–32)
Calcium: 7.8 mg/dL — ABNORMAL LOW (ref 8.4–10.5)
Chloride: 98 mEq/L (ref 96–112)
Creatinine, Ser: 1.83 mg/dL — ABNORMAL HIGH (ref 0.40–1.50)
GFR: 41.61 mL/min — ABNORMAL LOW (ref >60.00)
Glucose, Bld: 146 mg/dL — ABNORMAL HIGH (ref 70–99)
Potassium: 3.6 mEq/L (ref 3.5–5.1)
Sodium: 132 mEq/L — ABNORMAL LOW (ref 135–145)
Total Bilirubin: 20.5 mg/dL — ABNORMAL HIGH (ref 0.2–1.2)
Total Protein: 5.5 g/dL — ABNORMAL LOW (ref 6.0–8.3)

## 2019-02-07 LAB — PROTIME-INR
INR: 3 ratio — ABNORMAL HIGH (ref 0.8–1.0)
Prothrombin Time: 34.9 s — ABNORMAL HIGH (ref 9.6–13.1)

## 2019-02-07 MED ORDER — FOLIC ACID 1 MG PO TABS: 1.0000 mg | ORAL_TABLET | Freq: Every day | ORAL | 1 refills | Status: DC

## 2019-02-07 NOTE — Addendum Note (Signed)
Addended by: Larina Bras on: 02/07/2019 04:08 PM   Modules accepted: Orders

## 2019-02-07 NOTE — Progress Notes (Signed)
Subjective:    Patient ID: [name], male    DOB: 1980-10-19, 38 y.o.   MRN: [id number]  This service was provided via telemedicine.  Doximity apathy A/V communication The patient was located at home The provider was located in provider's GI office. The patient did consent to this telephone visit and is aware of possible charges through their insurance for this visit.   The persons participating in this telemedicine service were the patient, his brother and I. Time spent on call: 20 minutes   HPI [name] is a 38 year old male with a history of alcohol abuse and recent decompensated liver disease due to alcoholic hepatitis, history of viral hepatitis C, history of alcohol-related pancreatitis, cirrhosis who is seen for follow-up.  Last seen virtually on 01/26/2019.  Seen virtually today by Doximity app.  Since his last visit he went for an abdominal ultrasound to attempt large-volume paracentesis.  He did not have ascites and so no tap was done.  I had started him on low-dose Lasix and Aldactone but when his labs resulted a couple of days ago he had worsening in his serum creatinine and these medications were discontinued.  He has been off of diuretics for 2 days.  He reports that he has developed red blood per rectum with bowel movement.  His stools are harder and less frequent since decreasing lactulose.  Previously he was having diarrhea from lactulose.  He reports that his stools are brown and hard and "little chunks".  He strains with bowel movement.  He is seeing blood with wiping and some on the outside of the brown stool.  He is having abdominal discomfort as before but no discrete pain.  Appetite has been good.  He has some nausea occasionally but no vomiting.  He was taking rifaximin samples but ran out.  He is taking lactulose now 1 tablespoon twice daily.  He is taking pantoprazole daily and propranolol twice a day.   Review of Systems As per HPI, otherwise negative   Current Medications, Allergies, Past Medical History, Past Surgical History, Family History and Social History were reviewed in [employer] record.     Objective:   Physical Exam No PE, virtual visit  LIMITED ABDOMEN ULTRASOUND FOR ASCITES   TECHNIQUE: Limited ultrasound survey for ascites was performed in all four abdominal quadrants.   COMPARISON:  01/19/2019   FINDINGS: Survey of the abdominal 4 quadrants demonstrates no significant abdominopelvic ascites to warrant paracentesis. Procedure not performed.   IMPRESSION: No significant abdominopelvic ascites by ultrasound     Electronically Signed   By: Jerilynn Mages.  Shick M.D.   On: 02/01/2019 11:12   CMP     Component Value Date/Time   NA 134 (L) 02/06/2019 1108   K 3.4 (L) 02/06/2019 1108   CL 97 02/06/2019 1108   CO2 27 02/06/2019 1108   GLUCOSE 89 02/06/2019 1108   BUN 42 (H) 02/06/2019 1108   CREATININE 2.25 (H) 02/06/2019 1108   CREATININE 0.52 (L) 04/07/2017 1635   CALCIUM 8.1 (L) 02/06/2019 1108   PROT 5.5 (L) 02/06/2019 1108   ALBUMIN 2.1 (L) 02/06/2019 1108   AST 88 (H) 02/06/2019 1108   ALT 43 02/06/2019 1108   ALKPHOS 144 (H) 02/06/2019 1108   BILITOT 21.3 (H) 02/06/2019 1108   GFRNONAA >60 01/21/2019 0729   GFRAA >60 01/21/2019 0729   Lab Results  Component Value Date   INR 3.3 (H) 01/25/2019   INR 2.8 (H) 01/21/2019  INR 2.6 (H) 01/17/2019   CBC    Component Value Date/Time   WBC 10.6 (H) 01/25/2019 1017   RBC 3.88 (L) 01/25/2019 1017   HGB 13.8 01/25/2019 1017   HCT 40.7 01/25/2019 1017   PLT 48.0 Repeated and verified X2. (LL) 01/25/2019 1017   MCV 105.1 (H) 01/25/2019 1017   MCH 34.8 (H) 01/21/2019 0729   MCHC 33.9 01/25/2019 1017   RDW 18.3 (H) 01/25/2019 1017   LYMPHSABS 1.1 01/25/2019 1017   MONOABS 1.2 (H) 01/25/2019 1017   EOSABS 0.2 01/25/2019 1017   BASOSABS 0.1 01/25/2019 1017       Assessment & Plan:  38 year old male with a history of alcohol abuse  and recent decompensated liver disease due to alcoholic hepatitis, history of viral hepatitis C, history of alcohol-related pancreatitis, cirrhosis who is seen for follow-up.   1.  Alcoholic cirrhosis with recent acute alcoholic hepatitis/portal hypertension --he remains quite ill but has been clinically stable.  He does not have appreciable ascites and thus I have discontinued diuretics.  I am concerned about the rectal bleeding but feel that this is most likely hemorrhoidal at present, see #2. --He will be seen by [facility] next Thursday; I appreciate their involvement --Remain off diuretics --Increase lactulose as stools are too hard; increased to 2 tablespoons twice daily (if diarrhea returns he can change to 1 tablespoon 3 times daily) --Rifaximin samples have run out and he was told by the company that he did not qualify for assistance because he has drug insurance coverage.  This is not the case.  He has medical insurance coverage but not prescription drug coverage.  We need to clarify this with the company if possible because this medication is essential for him.  We will try to obtain samples from our drug reps; continue rifaximin 550 mg twice daily.  I made both the patient and his brother aware that if his mental status changes before he gets more rifaximin to notify us immediately or take him to the ER --Continue low-sodium diet --He remains alcohol abstinent which needs to continue --Repeat virtual visit the week of May 25 --Continue PPI daily --Continue twice daily propranolol --He is returning today for additional lab work --Folate needs to be refilled  2.  Rectal bleeding --sounds hemorrhoidal in nature based on his description.  Bleeding in a patient with decompensated cirrhosis is concerning but based on description hemorrhoids is felt to be the most likely etiology rather than a more proximal bleed.  I explained that if he is having frequent or copious blood per rectum  or passing clots that he should go to the ER immediately.  He voices understanding.  I am increasing lactulose to soften stools which should improve hemorrhoidal bleeding if this is the cause.  If this continues I would like to see him in the office. --CBC being done today  3.  History of hepatitis C --previously treated, unsure if he completed therapy.  Checking viral load today  3.  History of esophagitis and portal gastropathy --Daily PPI.  No report of melena.

## 2019-02-07 NOTE — Patient Instructions (Addendum)
Please keep your appointment at [facility] next Thursday.  Remain OFF diuretics.  Increase lactulose as stools are too hard. You should increase to 2 tablespoons (30 ml) twice daily (if diarrhea returns, you can change to 1 tablespoon 3 times daily)  We will attempt to get some more Xifaxan samples if able. In the meantime, try to appeal patient assistance denial.  Continue low-sodium diet, 2 grams of salt or less daily.  Continue to avoid all alcohol. I am proud of you for your progress with this!  You have been scheduled for a virtual ZOOM visit to phone [phone] on 02/22/19 at 4:00 pm.  Should your rectal bleeding continue despite increase in lactulose to soften stools, please let us know. We may need to see you in the office. In addition, if you have frequent or copious amounts of blood per rectum or are passing clots, you should go to the ER immediately for those symptoms.  Continue Pantoprazole twice daily  Continue twice daily propranolol  We have sent the following medications to your pharmacy for you to pick up at your convenience: Folate  Please go to the lab today for additional labwork.

## 2019-02-08 ENCOUNTER — Inpatient Hospital Stay (HOSPITAL_COMMUNITY)
Admission: EM | Admit: 2019-02-08 | Discharge: 2019-02-14 | DRG: 371 | Disposition: A | Discharge status: Home / Self Care | Payer: No Typology Code available for payment source | Attending: Family Medicine | Admitting: Family Medicine

## 2019-02-08 ENCOUNTER — Other Ambulatory Visit: Payer: Self-pay

## 2019-02-08 ENCOUNTER — Encounter (HOSPITAL_COMMUNITY): Payer: Self-pay | Admitting: Emergency Medicine

## 2019-02-08 DIAGNOSIS — Z833 Family history of diabetes mellitus: Secondary | ICD-10-CM

## 2019-02-08 DIAGNOSIS — E43 Unspecified severe protein-calorie malnutrition: Secondary | ICD-10-CM | POA: Diagnosis present

## 2019-02-08 DIAGNOSIS — E8770 Fluid overload, unspecified: Secondary | ICD-10-CM | POA: Diagnosis present

## 2019-02-08 DIAGNOSIS — R162 Hepatomegaly with splenomegaly, not elsewhere classified: Secondary | ICD-10-CM | POA: Diagnosis present

## 2019-02-08 DIAGNOSIS — N390 Urinary tract infection, site not specified: Secondary | ICD-10-CM

## 2019-02-08 DIAGNOSIS — F1020 Alcohol dependence, uncomplicated: Secondary | ICD-10-CM | POA: Diagnosis present

## 2019-02-08 DIAGNOSIS — K652 Spontaneous bacterial peritonitis: Principal | ICD-10-CM | POA: Diagnosis present

## 2019-02-08 DIAGNOSIS — Z1159 Encounter for screening for other viral diseases: Secondary | ICD-10-CM

## 2019-02-08 DIAGNOSIS — E871 Hypo-osmolality and hyponatremia: Secondary | ICD-10-CM | POA: Diagnosis not present

## 2019-02-08 DIAGNOSIS — K921 Melena: Secondary | ICD-10-CM | POA: Diagnosis present

## 2019-02-08 DIAGNOSIS — Z6835 Body mass index (BMI) 35.0-35.9, adult: Secondary | ICD-10-CM

## 2019-02-08 DIAGNOSIS — R9431 Abnormal electrocardiogram [ECG] [EKG]: Secondary | ICD-10-CM | POA: Diagnosis present

## 2019-02-08 DIAGNOSIS — D473 Essential (hemorrhagic) thrombocythemia: Secondary | ICD-10-CM

## 2019-02-08 DIAGNOSIS — D75839 Thrombocytosis, unspecified: Secondary | ICD-10-CM

## 2019-02-08 DIAGNOSIS — Z8661 Personal history of infections of the central nervous system: Secondary | ICD-10-CM

## 2019-02-08 DIAGNOSIS — K7011 Alcoholic hepatitis with ascites: Secondary | ICD-10-CM | POA: Diagnosis present

## 2019-02-08 DIAGNOSIS — Z87891 Personal history of nicotine dependence: Secondary | ICD-10-CM

## 2019-02-08 DIAGNOSIS — B9620 Unspecified Escherichia coli [E. coli] as the cause of diseases classified elsewhere: Secondary | ICD-10-CM | POA: Diagnosis present

## 2019-02-08 DIAGNOSIS — A419 Sepsis, unspecified organism: Secondary | ICD-10-CM

## 2019-02-08 DIAGNOSIS — E876 Hypokalemia: Secondary | ICD-10-CM | POA: Diagnosis present

## 2019-02-08 DIAGNOSIS — K7031 Alcoholic cirrhosis of liver with ascites: Secondary | ICD-10-CM | POA: Diagnosis present

## 2019-02-08 DIAGNOSIS — K7460 Unspecified cirrhosis of liver: Secondary | ICD-10-CM

## 2019-02-08 DIAGNOSIS — Z79899 Other long term (current) drug therapy: Secondary | ICD-10-CM

## 2019-02-08 DIAGNOSIS — D6959 Other secondary thrombocytopenia: Secondary | ICD-10-CM | POA: Diagnosis present

## 2019-02-08 DIAGNOSIS — Z8619 Personal history of other infectious and parasitic diseases: Secondary | ICD-10-CM

## 2019-02-08 NOTE — ED Notes (Signed)
Brother, [name], would like an update when possible at [phone]

## 2019-02-08 NOTE — ED Triage Notes (Signed)
Pt is from home with c/o of lower abd pain that began this evening while in bed. Pt has hx of liver failure. Pt had an episode of N/V/D last night with a little blood in his stool. Per EMS Pt is waiting on hospice care arrangements.

## 2019-02-09 ENCOUNTER — Emergency Department (HOSPITAL_COMMUNITY): Payer: No Typology Code available for payment source

## 2019-02-09 ENCOUNTER — Encounter (HOSPITAL_COMMUNITY): Payer: Self-pay | Admitting: Radiology

## 2019-02-09 ENCOUNTER — Telehealth: Payer: Self-pay | Admitting: Internal Medicine

## 2019-02-09 ENCOUNTER — Inpatient Hospital Stay (HOSPITAL_COMMUNITY): Payer: No Typology Code available for payment source

## 2019-02-09 DIAGNOSIS — A419 Sepsis, unspecified organism: Secondary | ICD-10-CM | POA: Diagnosis not present

## 2019-02-09 DIAGNOSIS — D75839 Thrombocytosis, unspecified: Secondary | ICD-10-CM

## 2019-02-09 DIAGNOSIS — D473 Essential (hemorrhagic) thrombocythemia: Secondary | ICD-10-CM

## 2019-02-09 DIAGNOSIS — N390 Urinary tract infection, site not specified: Secondary | ICD-10-CM | POA: Diagnosis not present

## 2019-02-09 DIAGNOSIS — K7030 Alcoholic cirrhosis of liver without ascites: Secondary | ICD-10-CM | POA: Diagnosis not present

## 2019-02-09 DIAGNOSIS — K652 Spontaneous bacterial peritonitis: Secondary | ICD-10-CM | POA: Diagnosis not present

## 2019-02-09 DIAGNOSIS — K7460 Unspecified cirrhosis of liver: Secondary | ICD-10-CM | POA: Diagnosis not present

## 2019-02-09 LAB — URINALYSIS, ROUTINE W REFLEX MICROSCOPIC
Glucose, UA: NEGATIVE mg/dL
Ketones, ur: NEGATIVE mg/dL
Nitrite: NEGATIVE
Protein, ur: NEGATIVE mg/dL
Specific Gravity, Urine: 1.011 (ref 1.005–1.030)
WBC, UA: >50 WBC/hpf — ABNORMAL HIGH (ref 0–5)
pH: 6 (ref 5.0–8.0)

## 2019-02-09 LAB — BASIC METABOLIC PANEL
Anion gap: 8 (ref 5–15)
BUN: 29 mg/dL — ABNORMAL HIGH (ref 6–20)
CO2: 28 mmol/L (ref 22–32)
Calcium: 8.6 mg/dL — ABNORMAL LOW (ref 8.9–10.3)
Chloride: 100 mmol/L (ref 98–111)
Creatinine, Ser: 1.59 mg/dL — ABNORMAL HIGH (ref 0.61–1.24)
GFR calc Af Amer: >60 mL/min (ref >60)
GFR calc non Af Amer: 54 mL/min — ABNORMAL LOW (ref >60)
Glucose, Bld: 94 mg/dL (ref 70–99)
Potassium: 3.4 mmol/L — ABNORMAL LOW (ref 3.5–5.1)
Sodium: 136 mmol/L (ref 135–145)

## 2019-02-09 LAB — CBC WITH DIFFERENTIAL/PLATELET
Abs Immature Granulocytes: 0.28 10*3/uL — ABNORMAL HIGH (ref 0.00–0.07)
Basophils Absolute: 0.1 10*3/uL (ref 0.0–0.1)
Basophils Relative: 2 %
Eosinophils Absolute: 0.3 10*3/uL (ref 0.0–0.5)
Eosinophils Relative: 7 %
HCT: 38.1 % — ABNORMAL LOW (ref 39.0–52.0)
Hemoglobin: 12.7 g/dL — ABNORMAL LOW (ref 13.0–17.0)
Immature Granulocytes: 6 %
Lymphocytes Relative: 34 %
Lymphs Abs: 1.5 10*3/uL (ref 0.7–4.0)
MCH: 34.2 pg — ABNORMAL HIGH (ref 26.0–34.0)
MCHC: 33.3 g/dL (ref 30.0–36.0)
MCV: 102.7 fL — ABNORMAL HIGH (ref 80.0–100.0)
Monocytes Absolute: 0.3 10*3/uL (ref 0.1–1.0)
Monocytes Relative: 6 %
Neutro Abs: 2.1 10*3/uL (ref 1.7–7.7)
Neutrophils Relative %: 45 %
Platelets: 44 10*3/uL — ABNORMAL LOW (ref 150–400)
RBC: 3.71 MIL/uL — ABNORMAL LOW (ref 4.22–5.81)
RDW: 15.4 % (ref 11.5–15.5)
WBC: 4.6 10*3/uL (ref 4.0–10.5)
nRBC: 0.4 % — ABNORMAL HIGH (ref 0.0–0.2)

## 2019-02-09 LAB — COMPREHENSIVE METABOLIC PANEL
ALT: 40 U/L (ref 0–44)
AST: 95 U/L — ABNORMAL HIGH (ref 15–41)
Albumin: 1.3 g/dL — ABNORMAL LOW (ref 3.5–5.0)
Alkaline Phosphatase: 119 U/L (ref 38–126)
Anion gap: 14 (ref 5–15)
BUN: 30 mg/dL — ABNORMAL HIGH (ref 6–20)
CO2: 18 mmol/L — ABNORMAL LOW (ref 22–32)
Calcium: 8.1 mg/dL — ABNORMAL LOW (ref 8.9–10.3)
Chloride: 103 mmol/L (ref 98–111)
Creatinine, Ser: 1.51 mg/dL — ABNORMAL HIGH (ref 0.61–1.24)
GFR calc Af Amer: >60 mL/min (ref >60)
GFR calc non Af Amer: 58 mL/min — ABNORMAL LOW (ref >60)
Glucose, Bld: 100 mg/dL — ABNORMAL HIGH (ref 70–99)
Potassium: 3.6 mmol/L (ref 3.5–5.1)
Sodium: 135 mmol/L (ref 135–145)
Total Bilirubin: 16.6 mg/dL — ABNORMAL HIGH (ref 0.3–1.2)
Total Protein: 5.6 g/dL — ABNORMAL LOW (ref 6.5–8.1)

## 2019-02-09 LAB — HEPATIC FUNCTION PANEL
ALT: 55 U/L — ABNORMAL HIGH (ref 0–44)
AST: 112 U/L — ABNORMAL HIGH (ref 15–41)
Albumin: 1.6 g/dL — ABNORMAL LOW (ref 3.5–5.0)
Alkaline Phosphatase: 163 U/L — ABNORMAL HIGH (ref 38–126)
Bilirubin, Direct: 9.3 mg/dL — ABNORMAL HIGH (ref 0.0–0.2)
Indirect Bilirubin: 10.5 mg/dL — ABNORMAL HIGH (ref 0.3–0.9)
Total Bilirubin: 19.8 mg/dL (ref 0.3–1.2)
Total Protein: 6.9 g/dL (ref 6.5–8.1)

## 2019-02-09 LAB — AMMONIA: Ammonia: 42 umol/L — ABNORMAL HIGH (ref 9–35)

## 2019-02-09 LAB — CBC
HCT: 35.1 % — ABNORMAL LOW (ref 39.0–52.0)
Hemoglobin: 11.2 g/dL — ABNORMAL LOW (ref 13.0–17.0)
MCH: 34.3 pg — ABNORMAL HIGH (ref 26.0–34.0)
MCHC: 31.9 g/dL (ref 30.0–36.0)
MCV: 107.3 fL — ABNORMAL HIGH (ref 80.0–100.0)
Platelets: 36 10*3/uL — ABNORMAL LOW (ref 150–400)
RBC: 3.27 MIL/uL — ABNORMAL LOW (ref 4.22–5.81)
RDW: 15.3 % (ref 11.5–15.5)
WBC: 5.8 10*3/uL (ref 4.0–10.5)
nRBC: 0 % (ref 0.0–0.2)

## 2019-02-09 LAB — PROTIME-INR
INR: 2.8 — ABNORMAL HIGH (ref 0.8–1.2)
INR: 3.3 — ABNORMAL HIGH (ref 0.8–1.2)
Prothrombin Time: 29.3 seconds — ABNORMAL HIGH (ref 11.4–15.2)
Prothrombin Time: 32.7 seconds — ABNORMAL HIGH (ref 11.4–15.2)

## 2019-02-09 LAB — ACETAMINOPHEN LEVEL: Acetaminophen (Tylenol), Serum: <10 ug/mL — ABNORMAL LOW (ref 10–30)

## 2019-02-09 LAB — ETHANOL: Alcohol, Ethyl (B): <10 mg/dL (ref <10)

## 2019-02-09 LAB — LIPASE, BLOOD: Lipase: 138 U/L — ABNORMAL HIGH (ref 11–51)

## 2019-02-09 LAB — LACTIC ACID, PLASMA: Lactic Acid, Venous: 1.9 mmol/L (ref 0.5–1.9)

## 2019-02-09 MED ORDER — ONDANSETRON HCL 4 MG PO TABS
4.0000 mg | ORAL_TABLET | Freq: Four times a day (QID) | ORAL | Status: DC | PRN
  Administered 2019-02-11: 4 mg via ORAL
  Filled 2019-02-09: qty 1

## 2019-02-09 MED ORDER — SODIUM CHLORIDE 0.9 % IV SOLN
1.0000 g | Freq: Once | INTRAVENOUS | Status: AC
  Administered 2019-02-09: 1 g via INTRAVENOUS
  Filled 2019-02-09: qty 10

## 2019-02-09 MED ORDER — SODIUM CHLORIDE 0.9 % IV BOLUS
500.0000 mL | Freq: Once | INTRAVENOUS | Status: AC
  Administered 2019-02-09: 500 mL via INTRAVENOUS

## 2019-02-09 MED ORDER — ENSURE ENLIVE PO LIQD
237.0000 mL | Freq: Two times a day (BID) | ORAL | Status: DC
  Administered 2019-02-09 – 2019-02-13 (×6): 237 mL via ORAL

## 2019-02-09 MED ORDER — SPIRONOLACTONE 50 MG PO TABS
50.0000 mg | ORAL_TABLET | Freq: Every day | ORAL | Status: DC
  Administered 2019-02-09: 50 mg via ORAL
  Filled 2019-02-09: qty 2
  Filled 2019-02-09: qty 1

## 2019-02-09 MED ORDER — LIDOCAINE HCL 1 % IJ SOLN
INTRAMUSCULAR | Status: AC
  Filled 2019-02-09: qty 20

## 2019-02-09 MED ORDER — SODIUM CHLORIDE 0.9 % IV SOLN: 1.0000 g | INTRAVENOUS | Status: DC

## 2019-02-09 MED ORDER — ONDANSETRON HCL 4 MG/2ML IJ SOLN
4.0000 mg | Freq: Four times a day (QID) | INTRAMUSCULAR | Status: DC | PRN
  Administered 2019-02-09 – 2019-02-10 (×2): 4 mg via INTRAVENOUS
  Filled 2019-02-09 (×3): qty 2

## 2019-02-09 MED ORDER — PROPRANOLOL HCL 20 MG PO TABS
20.0000 mg | ORAL_TABLET | Freq: Two times a day (BID) | ORAL | Status: DC
  Filled 2019-02-09 (×2): qty 1

## 2019-02-09 MED ORDER — BARRIER CREAM NON-SPECIFIED: 1.0000 "application " | TOPICAL_CREAM | Freq: Two times a day (BID) | TOPICAL | Status: DC | PRN

## 2019-02-09 MED ORDER — LACTULOSE 10 GM/15ML PO SOLN
10.0000 g | Freq: Two times a day (BID) | ORAL | Status: DC
  Administered 2019-02-09 – 2019-02-10 (×3): 10 g via ORAL
  Filled 2019-02-09 (×3): qty 15

## 2019-02-09 MED ORDER — RIFAXIMIN 550 MG PO TABS
550.0000 mg | ORAL_TABLET | Freq: Two times a day (BID) | ORAL | Status: DC
  Administered 2019-02-09 – 2019-02-14 (×11): 550 mg via ORAL
  Filled 2019-02-09 (×12): qty 1

## 2019-02-09 MED ORDER — ZINC OXIDE 40 % EX OINT
TOPICAL_OINTMENT | Freq: Two times a day (BID) | CUTANEOUS | Status: DC | PRN
  Administered 2019-02-09: 15:00:00 via TOPICAL
  Filled 2019-02-09: qty 57

## 2019-02-09 MED ORDER — PANTOPRAZOLE SODIUM 40 MG PO TBEC
40.0000 mg | DELAYED_RELEASE_TABLET | Freq: Every day | ORAL | Status: DC
  Administered 2019-02-09 – 2019-02-14 (×6): 40 mg via ORAL
  Filled 2019-02-09 (×6): qty 1

## 2019-02-09 MED ORDER — ADULT MULTIVITAMIN W/MINERALS CH
1.0000 | ORAL_TABLET | Freq: Every day | ORAL | Status: DC
  Administered 2019-02-09 – 2019-02-14 (×6): 1 via ORAL
  Filled 2019-02-09 (×6): qty 1

## 2019-02-09 MED ORDER — FOLIC ACID 1 MG PO TABS
1.0000 mg | ORAL_TABLET | Freq: Every day | ORAL | Status: DC
  Administered 2019-02-09 – 2019-02-14 (×6): 1 mg via ORAL
  Filled 2019-02-09 (×6): qty 1

## 2019-02-09 MED ORDER — IOHEXOL 300 MG/ML  SOLN
100.0000 mL | Freq: Once | INTRAMUSCULAR | Status: AC | PRN
  Administered 2019-02-09: 100 mL via INTRAVENOUS

## 2019-02-09 MED ORDER — SODIUM CHLORIDE 0.9 % IV SOLN
1.0000 g | INTRAVENOUS | Status: DC
  Administered 2019-02-10 – 2019-02-13 (×4): 1 g via INTRAVENOUS
  Filled 2019-02-09 (×4): qty 10

## 2019-02-09 NOTE — Telephone Encounter (Signed)
Patient brother called said that the patient was taken to the ED yesterday and wanted to let us know.

## 2019-02-09 NOTE — Progress Notes (Signed)
Patient Blood pressure is on the softer side.   Per MD ok to hold the Beta Blocker at this time

## 2019-02-09 NOTE — TOC Initial Note (Addendum)
Transition of Care [facility]) - Initial/Assessment Note    Patient Details  Name: [name] MRN: [id number] Date of Birth: May 19, 1981  Transition of Care [facility]) CM/SW Contact:    Sherrilyn Rist Phone Number: [phone] 02/09/2019, 9:51 AM  Clinical Narrative:                 02/09/2019 - Patient recently discharged home and readmitted with UTI; Primary Care Provider: Nicholes Rough, PA-C; has private insurance with [employer] with prescription drug coverage; CM will continue to follow for progression of care. Mindi Slicker RN,MHA,BSN  01/19/2019 -  Clinical Narrative:                Spoke w brother [name] who states that patient went home (<48 hours ago) and stayed with his [facility]. He acting confused and was not cooperative with taking his medications getting progressively weaker and ended up back in the hospital. Discussed notes w [name] and he was agreeable to Palliative consult. Palliative services recommended, spoke w patient and he was agreeable and deferred decisions to his brother [name].   Spoke w Anderson Malta from [place] who states they were consulted on patient prior to last DC. They will continue to follow through Epic and have scheduled a Zoom call/ visit with patient for Monday. Anderson Malta states there is not a need to notify when patient discharged, they will be aware through [employer] and communication with [place].  Patient has a RW at home. Patient does not have [place] services through his insurance, palliative CM is anticipating palliative services will be covered per Anderson Malta. D Swist RN CM   Expected Discharge Plan: Home/Self Care Barriers to Discharge: No Barriers Identified   Patient Goals and CMS Choice        Expected Discharge Plan and Services Expected Discharge Plan: Home/Self Care         Expected Discharge Date: 02/12/19                           [facility] Agency: NA        Prior Living Arrangements/Services              Need  for Family Participation in Patient Care: Yes (Comment) Care giver support system in place?: Yes (comment)   Criminal Activity/Legal Involvement Pertinent to Current Situation/Hospitalization: No - Comment as needed  Activities of Daily Living Home Assistive Devices/Equipment: None ADL Screening (condition at time of admission) Patient's cognitive ability adequate to safely complete daily activities?: Yes Is the patient deaf or have difficulty hearing?: No Does the patient have difficulty seeing, even when wearing glasses/contacts?: No Does the patient have difficulty concentrating, remembering, or making decisions?: No Patient able to express need for assistance with ADLs?: Yes Does the patient have difficulty dressing or bathing?: No Independently performs ADLs?: Yes (appropriate for developmental age) Does the patient have difficulty walking or climbing stairs?: Yes Weakness of Legs: Both Weakness of Arms/Hands: None  Permission Sought/Granted                  Emotional Assessment         Alcohol / Substance Use: Not Applicable Psych Involvement: No (comment)  Admission diagnosis:  Urinary tract infection without hematuria, site unspecified [N39.0] Sepsis, due to unspecified organism, unspecified whether acute organ dysfunction present [facility]) [A41.9] Patient Active Problem List   Diagnosis Date Noted  . UTI (urinary tract infection) 02/09/2019  . Thrombocytosis ([place])   .  E-coli UTI   . E coli bacteremia   . Urinary tract infection without hematuria   . Hepatic encephalopathy ([place]) 01/19/2019  . Weakness 01/18/2019  . AKI (acute kidney injury) ([place])   . Alcohol withdrawal ([place]) 01/12/2019  . Alcoholic hepatitis 84/13/2440  . Hyperbilirubinemia   . Acute on chronic alcoholic liver disease ([place])   . History of portal hypertension 04/12/2018  . Medication monitoring encounter 01/19/2017  . Weakness of right lower extremity 11/19/2016  . 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 Abnormality of gait   . Post-operative pain   . Infective myositis of right thigh   . Coagulopathy ([place])   . Alcohol withdrawal syndrome with complication ([place])   . Transaminitis   . Infection 11/12/2016  . Alcoholic cirrhosis of liver without ascites ([place])   . Goals of care, counseling/discussion   . Palliative care by specialist   . Pyogenic arthritis of multiple sites ([place])   . Traumatic compartment syndrome of other sites   . 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 Traumatic compartment syndrome of lower extremity ([place])   . 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 Delirium tremens ([place])   . Abnormal transaminases   . Alcoholic hepatitis without ascites   . ETOH abuse   . Abscess of right lower extremity   . Cellulitis and abscess of right lower extremity   . Hypernatremia   . Cellulitis of right lower extremity 10/30/2016  . EtOH dependence ([place]) 10/30/2016  . Thrombocytopenia ([place]) 10/30/2016  . Cirrhosis ([place])   . Paraspinal abscess ([place])   . Chronic hepatitis C without hepatic coma ([place])   . Hepatitis   . Epidural abscess 10/29/2016  . Liver failure ([place]) 10/29/2016  . Sepsis ([place]) 10/29/2016  . Hyponatremia 10/29/2016   PCP:  Nicholes Rough, PA-C Pharmacy:   [place], [place] - [address] [place] [address] Phone: [phone] Fax: [phone]     Social Determinants of Health (SDOH) Interventions    Readmission Risk Interventions No flowsheet data found.

## 2019-02-09 NOTE — ED Notes (Signed)
RN attempted IV x 2 without success.

## 2019-02-09 NOTE — Progress Notes (Signed)
Patient's BP dropped- 78/38 manual reading.   Paged the MD  MD ordered a bolus 500 ml NS.  Wanted [place] to recheck BP and to give another Bolus if BP did not come up.    BP went up 100/52.  Will continue to monitor BP

## 2019-02-09 NOTE — Telephone Encounter (Signed)
Dr [name], FYI.... 

## 2019-02-09 NOTE — ED Notes (Signed)
RN attempted for IV x2 without success 

## 2019-02-09 NOTE — Progress Notes (Signed)
FPTS Interim Progress Note  S:patient having epigastric pain.  He has not had vomiting, but has had red blood in his stools.  He has been having epigastric pain.    O: BP (!) 103/51 (BP Location: Left Arm)   Pulse 82   Temp 98.8 F (37.1 C) (Oral)   Resp 18   Ht 5\' 11"  (1.803 m)   Wt 128.1 kg   SpO2 98%   BMI 39.40 kg/m   GI: epigastric tenderness to palpation.   A/P: UTI - not septic.  Continue CTX for another 24 hrs while awaiting cultures.    Epigastric pain w/ cirrhotic liver failure - diagnostic paracentesis.  Not getting albumin with paracentesis d/t small volume.   - consider palliative consult.    Benay Pike, MD 02/09/2019, 1:15 PM PGY-1, [place] Medicine Service pager [phone]

## 2019-02-09 NOTE — Plan of Care (Signed)

## 2019-02-09 NOTE — ED Provider Notes (Signed)
[facility] EMERGENCY DEPARTMENT Provider Note   CSN: [id number] Arrival date & time: 02/08/19  2331    History   Chief Complaint Chief Complaint  Patient presents with  . Abdominal Pain    HPI [name] is a 38 y.o. male.     Patient presents to the emergency department for evaluation of abdominal pain.  Symptoms began 1 day ago.  He did have some episodes of nausea, vomiting and diarrhea last night.  He reports that there might of been a small amount of blood in his stool.  No diarrhea since then.  Patient complaining of pain across his lower abdomen that has been constant and crampy in nature.     Past Medical History:  Diagnosis Date  . Alcoholic cirrhosis ([place]) 37/1696   confirmed by liver biopsy.    . Cellulitis 10/2016   right leg  . 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 ETOH abuse   . Hepatitis C 2015   Hepatitis C. Rx Solvaldi/Ribaviran in 2015, undetectable Hep C 03/2015 (see Care Everywhere) but did not have sustained response. Began 12 weeks Mayvret 10/2017 by [facility] in [place], [place] PA-C  . Hepatitis, alcoholic 78/9381   treated with Prednisolone.    . Pancreatitis, alcoholic, acute 09/7508  . Paraspinal abscess ([place])   . Thrombocytopenia ([place]) 25/2018    Patient Active Problem List   Diagnosis Date Noted  . E-coli UTI   . E coli bacteremia   . Urinary tract infection without hematuria   . Hepatic encephalopathy ([place]) 01/19/2019  . Weakness 01/18/2019  . AKI (acute kidney injury) ([place])   . Alcohol withdrawal ([place]) 01/12/2019  . Alcoholic hepatitis 25/85/2778  . Hyperbilirubinemia   . Acute on chronic alcoholic liver disease ([place])   . History of portal hypertension 04/12/2018  . Medication monitoring encounter 01/19/2017  . Weakness of right lower extremity 11/19/2016  . 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 Abnormality of gait   . Post-operative pain   . Infective myositis  of right thigh   . Coagulopathy ([place])   . Alcohol withdrawal syndrome with complication ([place])   . Transaminitis   . Infection 11/12/2016  . Alcoholic cirrhosis of liver without ascites ([place])   . Goals of care, counseling/discussion   . Palliative care by specialist   . Pyogenic arthritis of multiple sites ([place])   . Traumatic compartment syndrome of other sites   . Alcoholic liver failure ([place])   . Traumatic compartment syndrome of lower extremity ([place])   . 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 Delirium tremens ([place])   . Abnormal transaminases   . Alcoholic hepatitis without ascites   . ETOH abuse   . Abscess of right lower extremity   . Cellulitis and abscess of right lower extremity   . Hypernatremia   . Cellulitis of right lower extremity 10/30/2016  . EtOH dependence ([place]) 10/30/2016  . Thrombocytopenia ([place]) 10/30/2016  . Cirrhosis ([place])   . Paraspinal abscess ([place])   . Chronic hepatitis C without hepatic coma ([place])   . Hepatitis   . Epidural abscess 10/29/2016  . Liver failure ([place]) 10/29/2016  . Sepsis ([place]) 10/29/2016  . Hyponatremia 10/29/2016    Past Surgical History:  Procedure Laterality Date  . DECOMPRESSIVE LUMBAR LAMINECTOMY LEVEL 1 N/A 11/01/2016   Procedure: DECOMPRESSIVE POSTERIOR LUMBAR LAMINECTOMY LEVEL 4-5;  Surgeon: Melina Schools, MD;  Location: [facility];  Service: Orthopedics;  Laterality: N/A;  . ESOPHAGOGASTRODUODENOSCOPY  11/11/2017  severe ulcertive esophagitis, retching related gastropathy, small HH.  no varices or portal hypertensive gastropathy.  Dr Lucienne Capers at [facility] in [place]  . ESOPHAGOGASTRODUODENOSCOPY  05/2014   Dr Rolan Lipa.  esophageal erythema (no Barretts on path), small HH, gastritis, duodenitis.    Marland Kitchen HERNIA REPAIR Left    INGUNIAL  . I&D EXTREMITY Right 11/01/2016   Procedure: IRRIGATION AND DEBRIDEMENT ABSCESS RIGHT THIGH;  Surgeon: Melina Schools, MD;  Location: [facility];  Service: Orthopedics;   Laterality: Right;  . INCISION AND DRAINAGE OF WOUND N/A 01/06/2017   Procedure: IRRIGATION AND DEBRIDEMENT and placement of wound vac;  Surgeon: Melina Schools, MD;  Location: [facility];  Service: Orthopedics;  Laterality: N/A;  . INCISION AND DRAINAGE OF WOUND N/A 12/02/2016   Procedure: WASHOUT AND CLOSURE OF WOUND;  Surgeon: Melina Schools, MD;  Location: [facility];  Service: Orthopedics;  Laterality: N/A;  . LUMBAR WOUND DEBRIDEMENT N/A 11/12/2016   Procedure: LUMBAR WOUND DEBRIDEMENT;  Surgeon: Melina Schools, MD;  Location: [facility];  Service: Orthopedics;  Laterality: N/A;        Home Medications    Prior to Admission medications   Medication Sig Start Date End Date Taking? Authorizing Provider  feeding supplement, ENSURE ENLIVE, (ENSURE ENLIVE) LIQD Take 237 mLs by mouth 2 (two) times daily between meals. 01/17/19   Rory Percy, DO  folic acid (FOLVITE) 1 MG tablet Take 1 tablet (1 mg total) by mouth daily. 02/07/19   [name], Lajuan Lines, MD  furosemide (LASIX) 20 MG tablet Take 1 tablet (20 mg total) by mouth daily. 01/30/19   [name], Lajuan Lines, MD  lactulose (CHRONULAC) 10 GM/15ML solution Take 15 mLs (10 g total) by mouth 2 (two) times daily. 01/26/19   [name], Lajuan Lines, MD  Multiple Vitamin (MULTIVITAMIN WITH MINERALS) TABS tablet Take 1 tablet by mouth daily. 11/17/16   Elgergawy, Silver Huguenin, MD  pantoprazole (PROTONIX) 40 MG tablet Take 1 tablet (40 mg total) by mouth every morning. 01/17/19   Rory Percy, DO  propranolol (INDERAL) 20 MG tablet Take 1 tablet (20 mg total) by mouth 2 (two) times daily. 01/21/19   Doristine Mango L, DO  spironolactone (ALDACTONE) 50 MG tablet Take 1 tablet (50 mg total) by mouth daily. 01/30/19   [name], Lajuan Lines, MD  tetrahydrozoline-zinc (VISINE-AC) 0.05-0.25 % ophthalmic solution Place 2 drops into both eyes daily as needed (itchy eyes).    [provider]    Family History Family History  Problem Relation Age of Onset  . Diabetes Mother   . Colon cancer Neg Hx    . Esophageal cancer Neg Hx   . Stomach cancer Neg Hx   . Pancreatic cancer Neg Hx   . Liver disease Neg Hx     Social History Social History   Tobacco Use  . Smoking status: Former Smoker    Last attempt to quit: 01/04/2017    Years since quitting: 2.0  . Smokeless tobacco: Never Used  . Tobacco comment: maybe once a week; occasional  Substance Use Topics  . Alcohol use: Not Currently    Comment: occasionally-no alcohol at all as of 01/04/19  . Drug use: No    Comment: as a teen a few times     Allergies   Acetaminophen   Review of Systems Review of Systems  Constitutional: Positive for chills.  Gastrointestinal: Positive for abdominal pain, blood in stool, diarrhea, nausea and vomiting.  All other systems reviewed and are negative.  Physical Exam Updated Vital Signs BP (!) 93/45   Pulse 88   Temp 100.1 F (37.8 C) (Oral)   Resp (!) 21   Ht 5\' 11"  (1.803 m)   Wt 113.9 kg   SpO2 96%   BMI 35.01 kg/m   Physical Exam Vitals signs and nursing note reviewed.  Constitutional:      General: He is not in acute distress.    Appearance: Normal appearance. He is well-developed.  HENT:     Head: Normocephalic and atraumatic.     Right Ear: Hearing normal.     Left Ear: Hearing normal.     Nose: Nose normal.  Eyes:     Conjunctiva/sclera: Conjunctivae normal.     Pupils: Pupils are equal, round, and reactive to light.  Neck:     Musculoskeletal: Normal range of motion and neck supple.  Cardiovascular:     Rate and Rhythm: Regular rhythm.     Heart sounds: S1 normal and S2 normal. No murmur. No friction rub. No gallop.   Pulmonary:     Effort: Pulmonary effort is normal. No respiratory distress.     Breath sounds: Normal breath sounds.  Chest:     Chest wall: No tenderness.  Abdominal:     General: Bowel sounds are normal.     Palpations: Abdomen is soft.     Tenderness: There is abdominal tenderness in the right lower quadrant, suprapubic area and left  lower quadrant. There is no guarding or rebound. Negative signs include Murphy's sign and McBurney's sign.     Hernia: No hernia is present.  Musculoskeletal: Normal range of motion.  Skin:    General: Skin is warm and dry.     Findings: No rash.  Neurological:     Mental Status: He is alert and oriented to person, place, and time.     GCS: GCS eye subscore is 4. GCS verbal subscore is 5. GCS motor subscore is 6.     Cranial Nerves: No cranial nerve deficit.     Sensory: No sensory deficit.     Coordination: Coordination normal.  Psychiatric:        Speech: Speech normal.        Behavior: Behavior normal.        Thought Content: Thought content normal.      ED Treatments / Results  Labs (all labs ordered are listed, but only abnormal results are displayed) Labs Reviewed  CBC WITH DIFFERENTIAL/PLATELET - Abnormal; Notable for the following components:      Result Value   RBC 3.71 (*)    Hemoglobin 12.7 (*)    HCT 38.1 (*)    MCV 102.7 (*)    MCH 34.2 (*)    Platelets 44 (*)    nRBC 0.4 (*)    Abs Immature Granulocytes 0.28 (*)    All other components within normal limits  BASIC METABOLIC PANEL - Abnormal; Notable for the following components:   Potassium 3.4 (*)    BUN 29 (*)    Creatinine, Ser 1.59 (*)    Calcium 8.6 (*)    GFR calc non Af Amer 54 (*)    All other components within normal limits  HEPATIC FUNCTION PANEL - Abnormal; Notable for the following components:   Albumin 1.6 (*)    AST 112 (*)    ALT 55 (*)    Alkaline Phosphatase 163 (*)    Total Bilirubin 19.8 (*)    Bilirubin, Direct 9.3 (*)  Indirect Bilirubin 10.5 (*)    All other components within normal limits  LIPASE, BLOOD - Abnormal; Notable for the following components:   Lipase 138 (*)    All other components within normal limits  URINALYSIS, ROUTINE W REFLEX MICROSCOPIC - Abnormal; Notable for the following components:   Color, Urine AMBER (*)    APPearance HAZY (*)    Hgb urine dipstick  MODERATE (*)    Bilirubin Urine SMALL (*)    Leukocytes,Ua MODERATE (*)    WBC, UA >50 (*)    Bacteria, UA MANY (*)    All other components within normal limits  AMMONIA - Abnormal; Notable for the following components:   Ammonia 42 (*)    All other components within normal limits  ACETAMINOPHEN LEVEL - Abnormal; Notable for the following components:   Acetaminophen (Tylenol), Serum <10 (*)    All other components within normal limits  PROTIME-INR - Abnormal; Notable for the following components:   Prothrombin Time 29.3 (*)    INR 2.8 (*)    All other components within normal limits  CULTURE, BLOOD (ROUTINE X 2)  CULTURE, BLOOD (ROUTINE X 2)  URINE CULTURE  ETHANOL  LACTIC ACID, PLASMA    EKG EKG Interpretation  Date/Time:  Wednesday Feb 08 2019 23:45:52 EDT Ventricular Rate:  71 PR Interval:    QRS Duration: 112 QT Interval:  436 QTC Calculation: 474 R Axis:   11 Text Interpretation:  Sinus rhythm Ventricular premature complex Short PR interval Borderline intraventricular conduction delay Low voltage, precordial leads No significant change since last tracing Confirmed by Orpah Greek [phone]) on 02/09/2019 2:50:55 AM   Radiology Ct Abdomen Pelvis W Contrast  Result Date: 02/09/2019 CLINICAL DATA:  Abdominal pain with nausea, vomiting and diarrhea. EXAM: CT ABDOMEN AND PELVIS WITH CONTRAST TECHNIQUE: Multidetector CT imaging of the abdomen and pelvis was performed using the standard protocol following bolus administration of intravenous contrast. CONTRAST:  135mL OMNIPAQUE IOHEXOL 300 MG/ML  SOLN COMPARISON:  CT abdomen pelvis 11/10/2017 FINDINGS: LOWER CHEST: Small right pleural effusion HEPATOBILIARY: Diffusely nodular hepatic contours with relative hypertrophy of the caudate and left hepatic lobe, consistent with hepatic cirrhosis. No focal liver lesion. No biliary dilatation. Mild gallbladder wall thickening and perihepatic ascites. PANCREAS: The pancreatic  parenchymal contours are normal and there is no ductal dilatation. There is no peripancreatic fluid collection. SPLEEN: Splenomegaly, measuring 15.3 cm in craniocaudal dimension. ADRENALS/URINARY TRACT: --Adrenal glands: Normal. --Right kidney/ureter: No hydronephrosis, nephroureterolithiasis, perinephric stranding or solid renal mass. --Left kidney/ureter: No hydronephrosis, nephroureterolithiasis, perinephric stranding or solid renal mass. --Urinary bladder: Normal for degree of distention STOMACH/BOWEL: --Stomach/Duodenum: There is no hiatal hernia or other gastric abnormality. The duodenal course and caliber are normal. --Small bowel: No dilatation or inflammation. --Colon: No focal abnormality. --Appendix: Normal. VASCULAR/LYMPHATIC: Normal course and caliber of the major abdominal vessels. Perisplenic varices. No abdominal or pelvic lymphadenopathy. REPRODUCTIVE: Normal prostate size with symmetric seminal vesicles. MUSCULOSKELETAL. No bony spinal canal stenosis or focal osseous abnormality. OTHER: Anasarca. IMPRESSION: 1. Hepatic cirrhosis with small volume ascites. 2. Splenomegaly and small caliber perisplenic varices, consistent with portal hypertension. 3. Small right pleural effusion. Electronically Signed   By: Ulyses Jarred M.D.   On: 02/09/2019 02:40    Procedures Procedures (including critical care time)  Medications Ordered in ED Medications  cefTRIAXone (ROCEPHIN) 1 g in sodium chloride 0.9 % 100 mL IVPB (1 g Intravenous New Bag/Given 02/09/19 0245)  iohexol (OMNIPAQUE) 300 MG/ML solution 100 mL (100 mLs Intravenous Contrast  Given 02/09/19 0219)  sodium chloride 0.9 % bolus 500 mL (500 mLs Intravenous New Bag/Given 02/09/19 0301)     Initial Impression / Assessment and Plan / ED Course  I have reviewed the triage vital signs and the nursing notes.  Pertinent labs & imaging results that were available during my care of the patient were reviewed by me and considered in my medical  decision making (see chart for details).        Patient presents to the emergency department for evaluation of abdominal pain with associated nausea, vomiting and diarrhea.  Patient has a history of alcoholic cirrhosis.  He was hospitalized 3 weeks ago with sepsis secondary to urinary tract infection.  At that time he also had what appeared to be acute alcoholic hepatitis and pancreatitis.  Patient reportedly awaiting hospice.  Patient appeared uncomfortable at arrival.  He had diffuse lower abdominal tenderness but no guarding or rebound.  No obvious signs of ascites on examination.  Patient was afebrile at arrival, normotensive.  He is ill-appearing, however.  He does not have a leukocytosis.  Renal insufficiency from previous visit is improved.  Ammonia is 42.  Bilirubin is 19.8 but this is actually improved from his previous hospital stay.  He has a chronic thrombocytopenia.  Urinalysis does look consistent with infection.  Reviewing his most recent hospitalization, urine culture did grow E. coli.  He additionally had E. coli bacteremia during that visit.  He has developed rigors here in the ER and now has a low-grade fever.  Blood pressure has dropped, will be given IV fluid resuscitation.  He was therefore considered likely early sepsis.  Lactic acid, blood cultures, urine culture added on.  Patient administered IV Rocephin.  He will be hospitalized for further management.  CRITICAL CARE Performed by: Orpah Greek   Total critical care time: 35 minutes  Critical care time was exclusive of separately billable procedures and treating other patients.  Critical care was necessary to treat or prevent imminent or life-threatening deterioration.  Critical care was time spent personally by me on the following activities: development of treatment plan with patient and/or surrogate as well as nursing, discussions with consultants, evaluation of patient's response to treatment, examination  of patient, obtaining history from patient or surrogate, ordering and performing treatments and interventions, ordering and review of laboratory studies, ordering and review of radiographic studies, pulse oximetry and re-evaluation of patient's condition.   Final Clinical Impressions(s) / ED Diagnoses   Final diagnoses:  Sepsis, due to unspecified organism, unspecified whether acute organ dysfunction present [facility])  Urinary tract infection without hematuria, site unspecified    ED Discharge Orders    None       Pollina, Gwenyth Allegra, MD 02/09/19 [phone]

## 2019-02-09 NOTE — H&P (Addendum)
[name] Admission History and Physical Service Pager: [phone]  Patient name: [name] Medical record number: [id number] Date of birth: 1981/05/13 Age: 38 y.o. Gender: male  Primary Care Provider: Nicholes Rough, PA-C Consultants: None Code Status: Full  Chief Complaint: Abdominal pain  Assessment and Plan: [name] is a 38 y.o. male presenting with lower abdominal pain. PMH is significant for hepatic cirrhosis, chronic thrombocytopenia, history of hepatitis C, malnutrition, and recent admission on 4/25 for UTI and E.coli bacteremia.  UTI with possible concern for developing Sepsis  Abdominal Pain: stable Patient presenting with worsening abdominal pain and urgency with UA significant for moderate leuks, >50 WBC, many bacteria, with negative nitrites. Patient admitted on 4/22 for UTI with similar UA findings and found to have pansensitive E.coli bacteremia treated with Keflex x 7 days. Given his hepatic cirrhosis, he is at increased risk for gram negative bacterial infections and bacteremia. Although patient was hemodynamically stable and afebrile on admission without leukocytosis, he began to develop lower blood pressures to 93/45 with a low grade temp of 100.1 concerning for development of sepsis. Given his most recent infection, there was low threshold to treat patient empirically for UTI. Patient was given 1.5L NS bolus, cultures drawn, started on IV Rocephin, and admitted for further antibiotics. Patient does endorse acute RUQ abdominal pain that radiates to periumbilical/LLQ area which is concerning for SBP, however CT abdomen/pelvis significant for known hepatic cirrhosis with only small volume ascites, making SBP less likely. Fortunately, empiric treatment for SBP includes Rocephin. Additionally, most recent abdominal ultrasound on 5/6 negative for ascites. Expect likely acute on chronic liver inflammation given increase in AST 86>112 and  ALT 40>55 since last admission. Unlikely pancreatitis given downtrending Lipase and normal CT findings. Admitted mainly due to his liver cirrhosis making him more likely to develop acute infections and the fact that he developed urosepsis last admission. Will watch for 48 hours until BCx and UCx result and then hopefully we will be able to switch to oral ABX and discharge home. Rapid COVID negative. - admit to med-surg, FPTS, attending Dr. Andria Frames - s/p IV Rocephin in ED - continue IV Rocephin 1g q24h - AM CBC, CMP, PT/INR - follow up blood and urine culture - routine vitals - SCDs - can consider ultrasound guided paracentesis for further evaluation if concern for SBP - Zofran for nausea - consider curbsiding GI   Prolonged QTc: QTc 474 on EKG. Denies any chest pain. 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 Denies any further alcohol use since last admission. Labs consistent with end stage liver disease with hyperbilirubinemia (19.8), thrombocytopenia (44), hypoalbuminemia (1.6), and elevated transaminases (AST/ALT 112/44) with most labs slightly improved from last admission. Patient seen by GI during last admission and was discontnued from PO steroids given poor response. PT/INR elevated but at baseline for patient. Electrolytes stable. Patient also has history of hepatitis C with unconfirmed treatment. Viral load obtained by PCP on 5/12, still pending. Home meds: Lactulose 2 Tbls BID, Rifaximin 5621m BID, Propranolol 269mBID, Folate 21m72mD, Spironolactone 65m42m Even though his overall labs show some small improvement, he remains a very high mortality risk which was discussed with patient on admission. GI seems to think it is still possible that he can make a recovery and I certainly hope this is  true. However, I am concerned that this is  now almost 4 weeks of signs of liver failure that have not responded to steroids so I do believe we are dealing with some level of permanent liver damage and not just alcoholic cirrhosis. How much liver function he is able to regain remains to be seen. - continue home meds  - If diarrhea returns, can change Lactulose to 1 Tbls TID - low sodium diet  - avoid hepatotoxic meds   H/o Rectal Bleeding: improved Likely 2/2 to hemorrhoids. Denies any melena or hematochezia. Hgb stable at 12.7. - continue to monitor  Thrombocytopenia: chronic, stable Platelets 44. Secondary to end stage liver disease.  - SCD's - CBC daily  Malnutrition  Albumin 1.6 (down from 2.0 at last admission). Nutrition consulted [place] admission and recommend Ensure and MTVI - Continue Ensure and MTVI  FEN/GI: Low sodium diet Prophylaxis: SCDs  Disposition: pending blood and urine culture   History of Present Illness:  [name] is a 38 y.o. male presenting with states he was at his brothers house this evening and felt fine and then they sat down to watch a movie. He went into the living room and he "started screaming and hollering" due to having pain in his abdomen. The pain started earlier in the day he says he thinks his belly has gotten bigger over the past couple of weeks. He describes his abdominal pain as "achey and sharp" and is mostly constant. He states bowel movements help with the abdominal pain. He also endorses some increased urgency, but denies any dysuria. He has been to his PCP and his GI doctor since his last admission with improvement in his lab. He endorses an episode of vomiting this evening, otherwise no infectious symptoms. Denies any fevers, chest pain, SOB, congestion, worsening diarrhea from baseline, dysuria, tremors, headaches.   Of note, patient was admitted on 4/22 and found to have pansensitive E. coli bacteremia and UTI.  He was treated with Keflex x 7 days.   In the ED,  patient presented with lower abdominal pain that he described as "cramping". He was hemodynamically stable and afebrile with BP 147/66, T 98, P 67, RR 23, SpO2 100% on RA. Patient had labs that consisted of CBC with WBC 4.6, Hgb 12.7, MCV 102.7, Plt 44. BMP with Na 136, K 3.4, BUN 29, Cr 1.59. Albumin 1.6, AST/ALT 112/55, Alk phos 163, Total Bili 19.8, Lipase 138, Alcohol <10, Acetaminophen <10, Ammonia 42. PT/INR 29.3/2.8. UA with moderate hgb, small bilirubin, mod leuks, >50 WBC, many bacteria. CT abdomen with hepatic cirrhosis with small volume ascites, splenomegaly and small caliber perisplenic varices consistent with portal HTN, and small right pleural effusion. Patient began to develop lower blood pressures to 93/45 and temp of 100.1, thus blood and urine cultures were obtained, he received 0.5L NS bolus, and was started on IV Rocephin. He was admitted for further IV antibiotics and to follow up cultures.   Review Of Systems: Per HPI with the following additions:   Review of Systems  Constitutional: Positive for chills. Negative for fever.  HENT: Negative for congestion.   Eyes: Negative for blurred vision and double vision.  Respiratory: Negative for cough and shortness of breath.   Cardiovascular: Positive for palpitations. Negative for chest pain.  Gastrointestinal: Positive for abdominal pain, nausea and vomiting. Negative for blood in stool and diarrhea.  Genitourinary: Positive for hematuria and urgency. Negative for dysuria and frequency.  Neurological: Negative for tremors and headaches.  Patient Active Problem List   Diagnosis Date Noted  . E-coli UTI   . E coli bacteremia   . Urinary tract infection without hematuria   . Hepatic encephalopathy ([place]) 01/19/2019  . Weakness 01/18/2019  . AKI (acute kidney injury) ([place])   . Alcohol withdrawal ([place]) 01/12/2019  . Alcoholic hepatitis 31/54/0086  . Hyperbilirubinemia   . Acute on chronic alcoholic liver disease ([place])   .  History of portal hypertension 04/12/2018  . Medication monitoring encounter 01/19/2017  . Weakness of right lower extremity 11/19/2016  . 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 Abnormality of gait   . Post-operative pain   . Infective myositis of right thigh   . Coagulopathy ([place])   . Alcohol withdrawal syndrome with complication ([place])   . Transaminitis   . Infection 11/12/2016  . Alcoholic cirrhosis of liver without ascites ([place])   . Goals of care, counseling/discussion   . Palliative care by specialist   . Pyogenic arthritis of multiple sites ([place])   . Traumatic compartment syndrome of other sites   . Alcoholic liver failure ([place])   . Traumatic compartment syndrome of lower extremity ([place])   . Infection of lumbar spine ([place]) 11/01/2016  . Delirium tremens ([place])   . Abnormal transaminases   . Alcoholic hepatitis without ascites   . ETOH abuse   . Abscess of right lower extremity   . Cellulitis and abscess of right lower extremity   . Hypernatremia   . Cellulitis of right lower extremity 10/30/2016  . EtOH dependence ([place]) 10/30/2016  . Thrombocytopenia ([place]) 10/30/2016  . Cirrhosis ([place])   . Paraspinal abscess ([place])   . Chronic hepatitis C without hepatic coma ([place])   . Hepatitis   . Epidural abscess 10/29/2016  . Liver failure ([place]) 10/29/2016  . Sepsis ([place]) 10/29/2016  . Hyponatremia 10/29/2016    Past Medical History: Past Medical History:  Diagnosis Date  . Alcoholic cirrhosis ([place]) 76/1950   confirmed by liver biopsy.    . Cellulitis 10/2016   right leg  . 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 ETOH abuse   . Hepatitis C 2015   Hepatitis C. Rx Solvaldi/Ribaviran in 2015, undetectable Hep C 03/2015 (see Care Everywhere) but did not have sustained response. Began 12 weeks Mayvret 10/2017 by [facility] in [place], [place] PA-C  . Hepatitis, alcoholic 93/2671   treated with Prednisolone.    . Pancreatitis, alcoholic, acute  24/5809  . Paraspinal abscess ([place])   . Thrombocytopenia ([place]) 25/2018    Past Surgical History: Past Surgical History:  Procedure Laterality Date  . DECOMPRESSIVE LUMBAR LAMINECTOMY LEVEL 1 N/A 11/01/2016   Procedure: DECOMPRESSIVE POSTERIOR LUMBAR LAMINECTOMY LEVEL 4-5;  Surgeon: Melina Schools, MD;  Location: [facility];  Service: Orthopedics;  Laterality: N/A;  . ESOPHAGOGASTRODUODENOSCOPY  98/33/8250   severe ulcertive esophagitis, retching related gastropathy, small HH.  no varices or portal hypertensive gastropathy.  Dr Lucienne Capers at [facility] in [place]  . ESOPHAGOGASTRODUODENOSCOPY  05/2014   Dr Rolan Lipa.  esophageal erythema (no Barretts on path), small HH, gastritis, duodenitis.    Marland Kitchen HERNIA REPAIR Left    INGUNIAL  . I&D EXTREMITY Right 11/01/2016   Procedure: IRRIGATION AND DEBRIDEMENT ABSCESS RIGHT THIGH;  Surgeon: Melina Schools, MD;  Location: [facility];  Service: Orthopedics;  Laterality: Right;  . INCISION AND DRAINAGE OF WOUND N/A 01/06/2017   Procedure: IRRIGATION AND DEBRIDEMENT and placement of wound vac;  Surgeon: Melina Schools, MD;  Location: [facility];  Service: Orthopedics;  Laterality: N/A;  . INCISION AND DRAINAGE OF WOUND N/A 12/02/2016   Procedure: WASHOUT AND CLOSURE OF WOUND;  Surgeon: Melina Schools, MD;  Location: [facility];  Service: Orthopedics;  Laterality: N/A;  . LUMBAR WOUND DEBRIDEMENT N/A 11/12/2016   Procedure: LUMBAR WOUND DEBRIDEMENT;  Surgeon: Melina Schools, MD;  Location: [facility];  Service: Orthopedics;  Laterality: N/A;    Social History: Social History   Tobacco Use  . Smoking status: Former Smoker    Last attempt to quit: 01/04/2017    Years since quitting: 2.0  . Smokeless tobacco: Never Used  . Tobacco comment: maybe once a week; occasional  Substance Use Topics  . Alcohol use: Not Currently    Comment: occasionally-no alcohol at all as of 01/04/19  . Drug use: No    Comment: as a teen a few times   Additional social history:  none Please also refer to relevant sections of EMR.  Family History: Family History  Problem Relation Age of Onset  . Diabetes Mother   . Colon cancer Neg Hx   . Esophageal cancer Neg Hx   . Stomach cancer Neg Hx   . Pancreatic cancer Neg Hx   . Liver disease Neg Hx     Allergies and Medications: Allergies  Allergen Reactions  . Acetaminophen Other (See Comments)    Due to liver issues   No current facility-administered medications on file prior to encounter.    Current Outpatient Medications on File Prior to Encounter  Medication Sig Dispense Refill  . feeding supplement, ENSURE ENLIVE, (ENSURE ENLIVE) LIQD Take 237 mLs by mouth 2 (two) times daily between meals. 381 mL 12  . folic acid (FOLVITE) 1 MG tablet Take 1 tablet (1 mg total) by mouth daily. 90 tablet 1  . furosemide (LASIX) 20 MG tablet Take 1 tablet (20 mg total) by mouth daily. 30 tablet 3  . lactulose (CHRONULAC) 10 GM/15ML solution Take 15 mLs (10 g total) by mouth 2 (two) times daily. 236 mL 0  . Multiple Vitamin (MULTIVITAMIN WITH MINERALS) TABS tablet Take 1 tablet by mouth daily.    . pantoprazole (PROTONIX) 40 MG tablet Take 1 tablet (40 mg total) by mouth every morning. 30 tablet 0  . propranolol (INDERAL) 20 MG tablet Take 1 tablet (20 mg total) by mouth 2 (two) times daily. 30 tablet 0  . spironolactone (ALDACTONE) 50 MG tablet Take 1 tablet (50 mg total) by mouth daily. 30 tablet 3  . tetrahydrozoline-zinc (VISINE-AC) 0.05-0.25 % ophthalmic solution Place 2 drops into both eyes daily as needed (itchy eyes).      Objective: BP (!) 93/45   Pulse 88   Temp 100.1 F (37.8 C) (Oral)   Resp (!) 21   Ht '5\' 11"'  (1.803 m)   Wt 113.9 kg   SpO2 96%   BMI 35.01 kg/m  Exam: General: larger jaundice middle aged male, ill appearing but in no acute distress with non-toxic appearance, lying in ED bed covered up with blankets HEENT: normocephalic, atraumatic, moist mucous membranes, PERRL, severe scleral icterus,  oropharynx without erythema or exudates  Neck: supple, normal ROM CV: regular rate and rhythm without murmurs, rubs, or gallops, 1+ pitting edema to knee Lungs: clear to auscultation bilaterally with normal work of breathing on RA Abdomen: soft, tender to RUQ and mid-lower abdomen, distended, hepatosplenomegaly, normoactive bowel sounds Skin: diffuse severe jaundice, no rashes or lesions noted Extremities: warm and well perfused, normal tone Neuro: Alert and orientedx3, speech  normal  Labs and Imaging: CBC BMET  Recent Labs  Lab 02/08/19 2353  WBC 4.6  HGB 12.7*  HCT 38.1*  PLT 44*   Recent Labs  Lab 02/08/19 2353  NA 136  K 3.4*  CL 100  CO2 28  BUN 29*  CREATININE 1.59*  GLUCOSE 94  CALCIUM 8.6*     Urinalysis    Component Value Date/Time   COLORURINE AMBER (A) 02/09/2019 0116   APPEARANCEUR HAZY (A) 02/09/2019 0116   LABSPEC 1.011 02/09/2019 0116   PHURINE 6.0 02/09/2019 0116   GLUCOSEU NEGATIVE 02/09/2019 0116   HGBUR MODERATE (A) 02/09/2019 0116   BILIRUBINUR SMALL (A) 02/09/2019 0116   KETONESUR NEGATIVE 02/09/2019 0116   PROTEINUR NEGATIVE 02/09/2019 0116   NITRITE NEGATIVE 02/09/2019 0116   LEUKOCYTESUR MODERATE (A) 02/09/2019 0116    PT/INR: 29.3/2.8 Lipase 138 Ethanol: <10 Tylenol: <10 Ammonia: 42  Hepatic Function Latest Ref Rng & Units 02/08/2019 02/07/2019 02/06/2019  Total Protein 6.5 - 8.1 g/dL 6.9 5.5(L) 5.5(L)  Albumin 3.5 - 5.0 g/dL 1.6(L) 2.0(L) 2.1(L)  AST 15 - 41 U/L 112(H) 86(H) 88(H)  ALT 0 - 44 U/L 55(H) 40 43  Alk Phosphatase 38 - 126 U/L 163(H) 139(H) 144(H)  Total Bilirubin 0.3 - 1.2 mg/dL 19.8(HH) 20.5(H) 21.3(H)  Bilirubin, Direct 0.0 - 0.2 mg/dL 9.3(H) - -    Ct Abdomen Pelvis W Contrast  Result Date: 02/09/2019 CLINICAL DATA:  Abdominal pain with nausea, vomiting and diarrhea. EXAM: CT ABDOMEN AND PELVIS WITH CONTRAST TECHNIQUE: Multidetector CT imaging of the abdomen and pelvis was performed using the standard protocol  following bolus administration of intravenous contrast. CONTRAST:  146m OMNIPAQUE IOHEXOL 300 MG/ML  SOLN COMPARISON:  CT abdomen pelvis 11/10/2017 FINDINGS: LOWER CHEST: Small right pleural effusion HEPATOBILIARY: Diffusely nodular hepatic contours with relative hypertrophy of the caudate and left hepatic lobe, consistent with hepatic cirrhosis. No focal liver lesion. No biliary dilatation. Mild gallbladder wall thickening and perihepatic ascites. PANCREAS: The pancreatic parenchymal contours are normal and there is no ductal dilatation. There is no peripancreatic fluid collection. SPLEEN: Splenomegaly, measuring 15.3 cm in craniocaudal dimension. ADRENALS/URINARY TRACT: --Adrenal glands: Normal. --Right kidney/ureter: No hydronephrosis, nephroureterolithiasis, perinephric stranding or solid renal mass. --Left kidney/ureter: No hydronephrosis, nephroureterolithiasis, perinephric stranding or solid renal mass. --Urinary bladder: Normal for degree of distention STOMACH/BOWEL: --Stomach/Duodenum: There is no hiatal hernia or other gastric abnormality. The duodenal course and caliber are normal. --Small bowel: No dilatation or inflammation. --Colon: No focal abnormality. --Appendix: Normal. VASCULAR/LYMPHATIC: Normal course and caliber of the major abdominal vessels. Perisplenic varices. No abdominal or pelvic lymphadenopathy. REPRODUCTIVE: Normal prostate size with symmetric seminal vesicles. MUSCULOSKELETAL. No bony spinal canal stenosis or focal osseous abnormality. OTHER: Anasarca. IMPRESSION: 1. Hepatic cirrhosis with small volume ascites. 2. Splenomegaly and small caliber perisplenic varices, consistent with portal hypertension. 3. Small right pleural effusion. Electronically Signed   By: KUlyses JarredM.D.   On: 02/09/2019 02:40   MDanna Hefty DO 02/09/2019, 3:09 AM PGY-1, [facility] pager: [phone] text pages welcome  Resident Attestation   I saw and evaluated the  patient, performing the key elements of the service.I  personally performed or re-performed the history, physical exam, and medical decision making activities of this service and have verified that the service and findings are accurately documented in the resident's note. I developed the management plan that is described in the resident's note, and I agree with the content, with my edits above in red.  Harolyn Rutherford, DO [facility], PGY-2

## 2019-02-09 NOTE — Progress Notes (Signed)
Patient's mother called-  Wanted an update on patient.  Asked patient for  Consent to talk to mother.  Patient said yes

## 2019-02-09 NOTE — Progress Notes (Signed)
Responded to spiritual consult to assist patient with AD. Per pt's nurse patient is gone for procedure. I gave AD form to nurse who will give to patient and will page Chaplain when ready. Chaplain will follow as needed.  Jaclynn Major, [place], [facility], Pager [phone]

## 2019-02-09 NOTE — Progress Notes (Signed)
Paged by nurse who reported that patient's blood pressures were low.  She checked twice automatically and once with manual.  Patient otherwise asymptomatic.  Alert and oriented x4, not confused, not dizzy while lying down in bed, other vitals are stable.  500 cc bolus  If less than 80/40 after first bolus, will push another 500 cc.  Can also consider albumin   Zettie Cooley, M.D.  Family Medicine  PGY-1 02/09/2019 5:41 PM

## 2019-02-10 DIAGNOSIS — K652 Spontaneous bacterial peritonitis: Secondary | ICD-10-CM | POA: Diagnosis not present

## 2019-02-10 DIAGNOSIS — K7030 Alcoholic cirrhosis of liver without ascites: Secondary | ICD-10-CM | POA: Diagnosis not present

## 2019-02-10 DIAGNOSIS — N390 Urinary tract infection, site not specified: Secondary | ICD-10-CM | POA: Diagnosis not present

## 2019-02-10 LAB — CBC WITH DIFFERENTIAL/PLATELET
Abs Immature Granulocytes: 0.21 10*3/uL — ABNORMAL HIGH (ref 0.00–0.07)
Basophils Absolute: 0.1 10*3/uL (ref 0.0–0.1)
Basophils Relative: 1 %
Eosinophils Absolute: 0.3 10*3/uL (ref 0.0–0.5)
Eosinophils Relative: 2 %
HCT: 30.6 % — ABNORMAL LOW (ref 39.0–52.0)
Hemoglobin: 10.5 g/dL — ABNORMAL LOW (ref 13.0–17.0)
Immature Granulocytes: 2 %
Lymphocytes Relative: 17 %
Lymphs Abs: 2.3 10*3/uL (ref 0.7–4.0)
MCH: 34.5 pg — ABNORMAL HIGH (ref 26.0–34.0)
MCHC: 34.3 g/dL (ref 30.0–36.0)
MCV: 100.7 fL — ABNORMAL HIGH (ref 80.0–100.0)
Monocytes Absolute: 2.4 10*3/uL — ABNORMAL HIGH (ref 0.1–1.0)
Monocytes Relative: 18 %
Neutro Abs: 8.4 10*3/uL — ABNORMAL HIGH (ref 1.7–7.7)
Neutrophils Relative %: 60 %
Platelets: 31 10*3/uL — ABNORMAL LOW (ref 150–400)
RBC: 3.04 MIL/uL — ABNORMAL LOW (ref 4.22–5.81)
RDW: 15 % (ref 11.5–15.5)
WBC: 13.7 10*3/uL — ABNORMAL HIGH (ref 4.0–10.5)
nRBC: 0.1 % (ref 0.0–0.2)

## 2019-02-10 LAB — URINE CULTURE: Culture: >=100000 — AB

## 2019-02-10 LAB — BASIC METABOLIC PANEL
Anion gap: 9 (ref 5–15)
BUN: 36 mg/dL — ABNORMAL HIGH (ref 6–20)
CO2: 23 mmol/L (ref 22–32)
Calcium: 8.2 mg/dL — ABNORMAL LOW (ref 8.9–10.3)
Chloride: 101 mmol/L (ref 98–111)
Creatinine, Ser: 1.68 mg/dL — ABNORMAL HIGH (ref 0.61–1.24)
GFR calc Af Amer: 59 mL/min — ABNORMAL LOW (ref >60)
GFR calc non Af Amer: 51 mL/min — ABNORMAL LOW (ref >60)
Glucose, Bld: 108 mg/dL — ABNORMAL HIGH (ref 70–99)
Potassium: 3.3 mmol/L — ABNORMAL LOW (ref 3.5–5.1)
Sodium: 133 mmol/L — ABNORMAL LOW (ref 135–145)

## 2019-02-10 MED ORDER — LACTULOSE 10 GM/15ML PO SOLN
7.5000 g | Freq: Two times a day (BID) | ORAL | Status: DC
  Administered 2019-02-10 – 2019-02-14 (×8): 7.5 g via ORAL
  Filled 2019-02-10 (×8): qty 15

## 2019-02-10 MED ORDER — POTASSIUM CHLORIDE CRYS ER 20 MEQ PO TBCR
40.0000 meq | EXTENDED_RELEASE_TABLET | Freq: Two times a day (BID) | ORAL | Status: AC
  Administered 2019-02-10: 40 meq via ORAL
  Filled 2019-02-10: qty 2

## 2019-02-10 MED ORDER — SODIUM CHLORIDE 0.9 % IV BOLUS
500.0000 mL | Freq: Once | INTRAVENOUS | Status: AC
  Administered 2019-02-10: 500 mL via INTRAVENOUS

## 2019-02-10 NOTE — Discharge Summary (Signed)
[facility] Discharge Summary  Patient name: [name] Medical record number: [id number] Date of birth: 05/31/1981 Age: 38 y.o. Gender: male Date of Admission: 02/08/2019  Date of Discharge: 02/14/2019 Admitting Physician: Zenia Resides, MD  Primary Care Provider: Nicholes Rough, PA-C Consultants: IR  Indication for Hospitalization: Abdominal pain, UTI  Discharge Diagnoses/Problem List:  UTI SBP Thrombocytosis Alcoholic Hepatic Cirrohosis Hypokalemia Hypomagnesemia  Hyponatremia Prolonged QTc Severe Protein calorie malnutrition   Disposition: Home  Discharge Condition: Improved  Discharge Exam:  General: pleasant gentleman, well nourished, well developed, in no acute distress with non-toxic appearance, lying comfortably in bed HEENT: normocephalic, atraumatic, moist mucous membranes, very jaundiced with pronounced scleral iscterus, PERRL Neck: supple, normal ROM CV: regular rate and rhythm without murmurs, rubs, or gallops, 1+ pitting edema to shin bilaterally, 2+ radial and pedal pulses bilaterally Lungs: clear to auscultation bilaterally with normal work of breathing Abdomen: soft, nontender, normoactive bowel sounds Skin: warm, dry, jaundice with scleral icterus Extremities: warm and well perfused Neuro: Alert and orientedx3, speech normal  Brief Hospital Course:  Patient has a history of end stage liver disease with a recent admission to the hospital for liver failure.    Patient was admitted to he ED for possible sepsis with complaints of nausea/vomiting and epigastric pain. His urinalysis revealed possible UTI, which was later confirmed with a urine culture positive for pansensitive E coli.  CT abdomen revealed small volume ascites, but a diagnostic paracentesis was unsuccessful. Patient did have significant abdominal rebound tenderness on exam 5/15, so there was concern that he had also developed SBP.  It was decided to treat the  patient for both UTI and presumed spontaneous bacterial peritonitis with 5 days of CTX.    The patient's nausea improved after the first day, but he continued to have diarrhea and his lactulose dose was reduced. His dose was originally reduced with improvement but was returned to home dose given lack of bowel movement.  He was discharged on 5/20 with Keflex after exhibiting continued improvement to finish a total 10 day course.    Patient experienced hypokalemia and hypomagnesemia throughout admission. This was replaced as needed. Electrolytes stable on discharge. Given patient is on Torsemide, spironolactone, and continued to experience lower electrolyte levels, it is highly recommended patient have close follow up for close monitoring.  He was discharged on 32mEg of potassium BID x 3-4 days then QD thereafter. He was also discharged on Magnesium oxide 400mg  QD.  Issues for Follow Up:  1. Recommend BMP to evaluate Mag and K level  2. Propranolol held at discharge given lower blood pressures. Please monitor BP's and recommend restarting as soon as BP tolerate 3. Lactulose dose returned to home dose given lack of adequate bowel movements on reduced dose. Please continue to monitor bowel movements and adjust as needed 4. Lasix transitioned to Torsemide  Significant Procedures: None  Significant Labs and Imaging:  Recent Labs  Lab 02/12/19 0626 02/13/19 0326 02/14/19 0321  WBC 11.6* 12.2* 11.2*  HGB 10.5* 10.3* 10.7*  HCT 30.9* 30.1* 31.3*  PLT 39* 33* 31*   Recent Labs  Lab 02/08/19 2353 02/09/19 0415  02/12/19 0626 02/12/19 1416 02/13/19 0326 02/13/19 1605 02/13/19 1815 02/14/19 0321 02/14/19 1228  NA 136 135   < > 132* 129* 129* 130*  --  130* 129*  K 3.4* 3.6   < > 2.6* 3.1* 2.8* 3.1*  --  2.9* 3.1*  CL 100 103   < > 95*  95* 95* 93*  --  91* 92*  CO2 28 18*   < > 24 23 26 28   --  28 27  GLUCOSE 94 100*   < > 81 117* 105* 121*  --  88 115*  BUN 29* 30*   < > 24* 24* 23* 20   --  20 16  CREATININE 1.59* 1.51*   < > 1.28* 1.30* 1.30* 1.31*  --  1.17 1.10  CALCIUM 8.6* 8.1*   < > 8.2* 8.0* 7.7* 8.0*  --  7.8* 8.1*  MG  --   --   --   --  1.4* 1.7 1.8  --  1.7  --   PHOS  --   --   --   --   --   --   --  4.0  --   --   ALKPHOS 163* 119  --  137*  --   --   --   --   --   --   AST 112* 95*  --  91*  --   --   --   --   --   --   ALT 55* 40  --  41  --   --   --   --   --   --   ALBUMIN 1.6* 1.3*  --  1.2*  --   --   --   --   --   --    < > = values in this interval not displayed.   Lipase 138 Ethanol <10 Tylenol <10 Ammonia 42 Lactic acid: 1.9  Blood culture: NG x 5 days Urine Culture: >100,000 colonies E.coli   Urinalysis    Component Value Date/Time   COLORURINE AMBER (A) 02/09/2019 0116   APPEARANCEUR HAZY (A) 02/09/2019 0116   LABSPEC 1.011 02/09/2019 0116   PHURINE 6.0 02/09/2019 0116   GLUCOSEU NEGATIVE 02/09/2019 0116   HGBUR MODERATE (A) 02/09/2019 0116   BILIRUBINUR SMALL (A) 02/09/2019 0116   KETONESUR NEGATIVE 02/09/2019 0116   PROTEINUR NEGATIVE 02/09/2019 0116   NITRITE NEGATIVE 02/09/2019 0116   LEUKOCYTESUR MODERATE (A) 02/09/2019 0116      Ct Head Wo Contrast  Result Date: 01/19/2019 CLINICAL DATA:  Weakness and confusion EXAM: CT HEAD WITHOUT CONTRAST TECHNIQUE: Contiguous axial images were obtained from the base of the skull through the vertex without intravenous contrast. COMPARISON:  None. FINDINGS: Brain: There is no mass, hemorrhage or extra-axial collection. The size and configuration of the ventricles and extra-axial CSF spaces are normal. The brain parenchyma is normal, without acute or chronic infarction. Vascular: No abnormal hyperdensity of the major intracranial arteries or dural venous sinuses. No intracranial atherosclerosis. Probable right frontal DVA. Skull: The visualized skull base, calvarium and extracranial soft tissues are normal. Sinuses/Orbits: No fluid levels or advanced mucosal thickening of the visualized  paranasal sinuses. No mastoid or middle ear effusion. The orbits are normal. IMPRESSION: Normal brain. Electronically Signed   By: Ulyses Jarred M.D.   On: 01/19/2019 02:35   Ct Abdomen Pelvis W Contrast  Result Date: 02/09/2019 CLINICAL DATA:  Abdominal pain with nausea, vomiting and diarrhea. EXAM: CT ABDOMEN AND PELVIS WITH CONTRAST TECHNIQUE: Multidetector CT imaging of the abdomen and pelvis was performed using the standard protocol following bolus administration of intravenous contrast. CONTRAST:  133mL OMNIPAQUE IOHEXOL 300 MG/ML  SOLN COMPARISON:  CT abdomen pelvis 11/10/2017 FINDINGS: LOWER CHEST: Small right pleural effusion HEPATOBILIARY: Diffusely nodular hepatic contours with relative hypertrophy of the  caudate and left hepatic lobe, consistent with hepatic cirrhosis. No focal liver lesion. No biliary dilatation. Mild gallbladder wall thickening and perihepatic ascites. PANCREAS: The pancreatic parenchymal contours are normal and there is no ductal dilatation. There is no peripancreatic fluid collection. SPLEEN: Splenomegaly, measuring 15.3 cm in craniocaudal dimension. ADRENALS/URINARY TRACT: --Adrenal glands: Normal. --Right kidney/ureter: No hydronephrosis, nephroureterolithiasis, perinephric stranding or solid renal mass. --Left kidney/ureter: No hydronephrosis, nephroureterolithiasis, perinephric stranding or solid renal mass. --Urinary bladder: Normal for degree of distention STOMACH/BOWEL: --Stomach/Duodenum: There is no hiatal hernia or other gastric abnormality. The duodenal course and caliber are normal. --Small bowel: No dilatation or inflammation. --Colon: No focal abnormality. --Appendix: Normal. VASCULAR/LYMPHATIC: Normal course and caliber of the major abdominal vessels. Perisplenic varices. No abdominal or pelvic lymphadenopathy. REPRODUCTIVE: Normal prostate size with symmetric seminal vesicles. MUSCULOSKELETAL. No bony spinal canal stenosis or focal osseous abnormality. OTHER:  Anasarca. IMPRESSION: 1. Hepatic cirrhosis with small volume ascites. 2. Splenomegaly and small caliber perisplenic varices, consistent with portal hypertension. 3. Small right pleural effusion. Electronically Signed   By: Ulyses Jarred M.D.   On: 02/09/2019 02:40   Ir Abdomen US Limited  Result Date: 02/09/2019 CLINICAL DATA:  Evaluate for intra-abdominal ascites and perform ultrasound-guided paracentesis as indicated. EXAM: LIMITED ABDOMEN ULTRASOUND FOR ASCITES TECHNIQUE: Limited ultrasound survey for ascites was performed in all four abdominal quadrants. COMPARISON:  CT abdomen and pelvis - 02/09/2019; ascites search ultrasound - 02/01/2019; 01/19/2019 FINDINGS: Sonographic evaluation of the abdomen demonstrates a trace amount of perihepatic fluid, too small to allow for safe ultrasound-guided paracentesis. No paracentesis attempted. IMPRESSION: Trace amount of perihepatic ascites, too small to allow for safe ultrasound-guided paracenteses. Electronically Signed   By: Sandi Mariscal M.D.   On: 02/09/2019 12:38   Ir Abdomen US Limited  Result Date: 02/01/2019 CLINICAL DATA:  Alcoholic hepatitis, abdominal distension, assess for paracentesis EXAM: LIMITED ABDOMEN ULTRASOUND FOR ASCITES TECHNIQUE: Limited ultrasound survey for ascites was performed in all four abdominal quadrants. COMPARISON:  01/19/2019 FINDINGS: Survey of the abdominal 4 quadrants demonstrates no significant abdominopelvic ascites to warrant paracentesis. Procedure not performed. IMPRESSION: No significant abdominopelvic ascites by ultrasound Electronically Signed   By: Jerilynn Mages.  Shick M.D.   On: 02/01/2019 11:12   Results/Tests Pending at Time of Discharge: None  Discharge Medications:  Allergies as of 02/14/2019      Reactions   Acetaminophen Other (See Comments)   Due to liver issues      Medication List    STOP taking these medications   furosemide 20 MG tablet Commonly known as:  LASIX   propranolol 20 MG tablet Commonly known  as:  INDERAL     TAKE these medications   cephALEXin 500 MG capsule Commonly known as:  KEFLEX Take 1 capsule (500 mg total) by mouth every 6 (six) hours for 4 days.   feeding supplement (ENSURE ENLIVE) Liqd Take 237 mLs by mouth 2 (two) times daily between meals.   folic acid 1 MG tablet Commonly known as:  FOLVITE Take 1 tablet (1 mg total) by mouth daily.   Gerhardt's butt cream Crea Apply 1 application topically 3 (three) times daily.   lactulose 10 GM/15ML solution Commonly known as:  CHRONULAC Take 15 mLs (10 g total) by mouth 2 (two) times daily. What changed:  how much to take   liver oil-zinc oxide 40 % ointment Commonly known as:  DESITIN Apply topically 2 (two) times daily as needed for irritation.   magnesium oxide 400 MG tablet Commonly known as:  MAG-OX Take 1 tablet (400 mg total) by mouth daily.   multivitamin with minerals Tabs tablet Take 1 tablet by mouth daily.   pantoprazole 40 MG tablet Commonly known as:  PROTONIX Take 1 tablet (40 mg total) by mouth every morning.   potassium chloride 10 MEQ tablet Commonly known as:  K-DUR Take 4 tablets (40 mEq total) by mouth 2 (two) times daily for 4 days.   potassium chloride 10 MEQ tablet Commonly known as:  K-DUR Take 4 tablets (40 mEq total) by mouth daily. Start taking on:  Feb 19, 2019   rifaximin 550 MG Tabs tablet Commonly known as:  XIFAXAN Take 550 mg by mouth 2 (two) times daily.   spironolactone 50 MG tablet Commonly known as:  ALDACTONE Take 1 tablet (50 mg total) by mouth daily.   tetrahydrozoline-zinc 0.05-0.25 % ophthalmic solution Commonly known as:  VISINE-AC Place 2 drops into both eyes daily as needed (itchy eyes).   torsemide 20 MG tablet Commonly known as:  DEMADEX Take 1 tablet (20 mg total) by mouth daily. Start taking on:  Feb 15, 2019       Discharge Instructions: Please refer to Patient Instructions section of EMR for full details.  Patient was counseled  important signs and symptoms that should prompt return to medical care, changes in medications, dietary instructions, activity restrictions, and follow up appointments.   Follow-Up Appointments: Follow-up Information    Nicholes Rough, PA-C. Go on 02/17/2019.   Specialty:  Physician Assistant Why:  at 11:30am Contact information: [place] [address] [phone]           Danna Hefty, DO 02/14/2019, 1:44 PM PGY-1, [place]

## 2019-02-10 NOTE — Progress Notes (Signed)
Family Medicine Teaching Service Daily Progress Note Intern Pager: [phone]  Patient name: [name] Medical record number: [id number] Date of birth: Jan 24, 1981 Age: 38 y.o. Gender: male  Primary Care Provider: Nicholes Rough, PA-C Consultants:  Code Status: full  Pt Overview and Major Events to Date:  5/14 - started CTX  Assessment and Plan:  [name] is a 38 y.o. male presenting with lower abdominal pain. PMH is significant for hepatic cirrhosis, chronic thrombocytopenia, history of hepatitis C, malnutrition, and recent admission on 4/25 for UTI and E.coli bacteremia.  Epigastric pain - possibly related to his cirrhosis/liver failure.  Could represent a gastric ulcer or pancreatitis or SBP.  Lipase elevated but is also chronically elevated.  Unable to perform paracentesis.  AST and ALT are similar to last admission not that long ago. Will treat for presumed SBP w/ 5 days of CTX - CTX - day 2/5 (5/14-5/18)  UTI with possible concern for developing Sepsis >100k GNR on culture. Wbc jumped from 5.8 to 13.7 overnight. Currently treating with rocephin.  Recently treated last month for E.coli UTI with keflex x 7days. Afebrile but blood pressure decreasing since admission, requiring fluid boluses - monitor BP - will be covered by the CTX for presumed SBP  - f/u blood cultures  Prolonged QTc: QTc 474 on EKG. Denies any chest pain. 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 Denies any further alcohol use since last admission. Labs consistent with end stage liver disease with hyperbilirubinemia (19.8), thrombocytopenia (44), hypoalbuminemia (1.6), and elevated transaminases (AST/ALT 112/44) with most labs slightly improved from last admission. Patient seen by GI during last admission and was discontnued from PO  steroids given poor response. PT/INR elevated but at baseline for patient. Electrolytes stable. Patient also has history of hepatitis C with unconfirmed treatment. Home meds: Lactulose 2 Tbls BID, Rifaximin 550mg  BID, Propranolol 20mg  BID, Folate 1mg  QD, Spironolactone 50mg  QD Even though his overall labs show some small improvement, he remains a very high mortality risk which was discussed with patient on admission.  - continue home meds  - decreased lactulose to 7.5g BID d/t increased diarrhea. - low sodium diet  - avoid hepatotoxic meds   H/o Rectal Bleeding: improved Likely 2/2 to hemorrhoids. Denies any melena or hematochezia. Hgb stable at 12.7. - continue to monitor  Thrombocytopenia: chronic, stable Platelets 44. Secondary to end stage liver disease.  - SCD's - CBC daily  Malnutrition  Albumin 1.6 (down from 2.0 at last admission). Nutrition consulted [place] admission and recommend Ensure and MTVI - Continue Ensure and MTVI  FEN/GI: Low sodium diet Prophylaxis: SCDs  Disposition: home  Subjective:  Patient says he had nausea with no vomiting and 3-4 loose BM's last night.  His abdomen still hurts but he says he feels better than yesterday.   Objective: Temp:  [97.9 F (36.6 C)-98.8 F (37.1 C)] 97.9 F (36.6 C) (05/15 0036) Pulse Rate:  [79-100] 84 (05/15 0440) Resp:  [18] 18 (05/15 0440) BP: (65-107)/(38-54) 81/46 (05/15 0440) SpO2:  [90 %-98 %] 94 % (05/15 0440) Weight:  [129.7 kg] 129.7 kg (05/15 0048) Physical Exam: General: alert and oriented. In mild discomfort Cardiovascular: RRR.  No murmurs.  Respiratory: LCTAB. No wheezes Abdomen: mildly distended.  Diffuse tenderness with severe tenderness in the epigastric region.  Extremities: minimal pedal edema.  Laboratory: Recent Labs  Lab 02/07/19 1247 02/08/19 2353 02/09/19 0415  WBC 11.1* 4.6 5.8  HGB 12.3* 12.7* 11.2*  HCT 35.7* 38.1* 35.1*  PLT 40.0 Repeated and verified X2.* 44* 36*    Recent Labs  Lab 02/07/19 1247 02/08/19 2353 02/09/19 0415 02/10/19 0430  NA 132* 136 135 133*  K 3.6 3.4* 3.6 3.3*  CL 98 100 103 101  CO2 28 28 18* 23  BUN 40* 29* 30* 36*  CREATININE 1.83* 1.59* 1.51* 1.68*  CALCIUM 7.8* 8.6* 8.1* 8.2*  PROT 5.5* 6.9 5.6*  --   BILITOT 20.5* 19.8* 16.6*  --   ALKPHOS 139* 163* 119  --   ALT 40 55* 40  --   AST 86* 112* 95*  --   GLUCOSE 146* 94 100* 108*      Imaging/Diagnostic Tests:   Benay Pike, MD 02/10/2019, 6:43 AM PGY-1, [place] Intern pager: [phone], text pages welcome

## 2019-02-11 DIAGNOSIS — N390 Urinary tract infection, site not specified: Secondary | ICD-10-CM | POA: Diagnosis not present

## 2019-02-11 DIAGNOSIS — K7460 Unspecified cirrhosis of liver: Secondary | ICD-10-CM | POA: Diagnosis not present

## 2019-02-11 DIAGNOSIS — A419 Sepsis, unspecified organism: Secondary | ICD-10-CM | POA: Diagnosis not present

## 2019-02-11 LAB — CBC
HCT: 30.6 % — ABNORMAL LOW (ref 39.0–52.0)
Hemoglobin: 10.4 g/dL — ABNORMAL LOW (ref 13.0–17.0)
MCH: 34.4 pg — ABNORMAL HIGH (ref 26.0–34.0)
MCHC: 34 g/dL (ref 30.0–36.0)
MCV: 101.3 fL — ABNORMAL HIGH (ref 80.0–100.0)
Platelets: 34 10*3/uL — ABNORMAL LOW (ref 150–400)
RBC: 3.02 MIL/uL — ABNORMAL LOW (ref 4.22–5.81)
RDW: 14.6 % (ref 11.5–15.5)
WBC: 13.7 10*3/uL — ABNORMAL HIGH (ref 4.0–10.5)
nRBC: 0.2 % (ref 0.0–0.2)

## 2019-02-11 MED ORDER — SPIRONOLACTONE 25 MG PO TABS
50.0000 mg | ORAL_TABLET | Freq: Every day | ORAL | Status: DC
  Administered 2019-02-11 – 2019-02-14 (×4): 50 mg via ORAL
  Filled 2019-02-11 (×4): qty 2

## 2019-02-11 MED ORDER — TORSEMIDE 20 MG PO TABS
20.0000 mg | ORAL_TABLET | Freq: Every day | ORAL | Status: DC
  Administered 2019-02-11 – 2019-02-12 (×2): 20 mg via ORAL
  Filled 2019-02-11 (×2): qty 1

## 2019-02-11 NOTE — Progress Notes (Signed)
Family Medicine Teaching Service Daily Progress Note Intern Pager: [phone]  Patient name: [name] Medical record number: [id number] Date of birth: 11-Nov-1980 Age: 38 y.o. Gender: male  Primary Care Provider: Nicholes Rough, PA-C Consultants:  Code Status: full  Pt Overview and Major Events to Date:  5/14 - started CTX  Assessment and Plan:  [name] is a 38 y.o. male presenting with lower abdominal pain. PMH is significant for hepatic cirrhosis, chronic thrombocytopenia, history of hepatitis C, malnutrition, and recent admission on 4/25 for UTI and E.coli bacteremia.  Epigastric pain - acute, improving. Possibly related to his cirrhosis/liver failure.  Unlikely to be pancreatitis as lipase is consistently elevated and pattern and quality of pain does not fit. SBP considered however with no ascites by definition it is not SBP. However, if this were perhaps the early stages he is still covered as he is on CTX of a UTI.  AST and ALT are similar to last admission not that long ago. Patient has remained afebrile since admission. Will treat for presumed SBP w/ 5 days of CTX - CTX - day 3/5 (5/14-5/18) - monitor fever curve and WBC  UTI with possible concern for developing Sepsis >100k GNR E. Coli pan sensitive. Wbc jumped from 5.8 to 13.7 yesterday, unchanged this am. Currently treating with rocephin.  Recently treated last month for E.coli UTI with keflex x 7days. Afebrile but blood pressure decreasing since admission, requiring fluid boluses - monitor BP - will be covered by the CTX IV; can convert to oral cefazolin after 5 days IV  Prolonged QTc: QTc 474 on EKG. Denies any chest pain. 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 Denies any further alcohol use since last admission. Labs consistent with end  stage liver disease with hyperbilirubinemia (19.8), thrombocytopenia (44), hypoalbuminemia (1.6), and elevated transaminases (AST/ALT 112/44) with most labs slightly improved from last admission. Patient seen by GI during last admission; GI was consulted during this admission and refused to see patient. PT/INR elevated but at baseline for patient. Electrolytes stable. Patient also has history of hepatitis C with unconfirmed treatment. Home meds: Lactulose 2 Tbls BID, Rifaximin 550mg  BID, Propranolol 20mg  BID, Folate 1mg  QD, Spironolactone 50mg  QD Even though his overall labs show some small improvement, he remains a very high mortality risk which was discussed with patient on admission.  - Cont to avoid all EtOH consumption on discharge - holding home meds; spironolactone and propanolol given low to normotensive blood pressures - Cont Rifaximin - decreased lactulose to 7.5g BID d/t increased diarrhea. - low sodium diet  - avoid hepatotoxic meds   H/o Rectal Bleeding: improved Likely 2/2 to hemorrhoids. Denies any melena or hematochezia. Hgb stable at 12.7>10.5>10.4. - continue to monitor  Thrombocytopenia: chronic, stable Platelets 44k > 31k > 34k, . Secondary to end stage liver disease.  - SCD's - CBC daily  Malnutrition  Albumin 1.6 (down from 2.0 at last admission). Nutrition consulted [place] admission and recommend Ensure and MTVI - Continue Ensure and MTVI  FEN/GI: Low sodium diet Prophylaxis: SCDs  Disposition: home  Subjective:  Patient states he is feeling better with no vomiting and minimal nausea over the past 24 hours. He is still having abdominal pain but only when he moves. Patient would like to shower this morning.   Objective: Temp:  [98.2 F (  36.8 C)-99.5 F (37.5 C)] 98.2 F (36.8 C) (05/16 0532) Pulse Rate:  [74-81] 74 (05/16 0532) Resp:  [16-18] 18 (05/15 1928) BP: (91-112)/(51-68) 91/51 (05/16 0532) SpO2:  [97 %-99 %] 97 % (05/16 0532) Weight:   [130.7 kg] 130.7 kg (05/16 0532) Physical Exam: Gen: Alert and Oriented x 3, NAD HEENT: Normocephalic, atraumatic CV: RRR, no murmurs, normal S1, S2 split Resp: CTAB, no wheezing, rales, or rhonchi, comfortable work of breathing Abd: obese, hepatomegaly, TTP in the [place] and LRQ, soft, +bs in all four quadrants Ext: no clubbing, cyanosis, or edema, +2 distal pulses bilaterally in LE Skin: warm, dry, intact, no rashes  Laboratory: Recent Labs  Lab 02/08/19 2353 02/09/19 0415 02/10/19 0430  WBC 4.6 5.8 13.7*  HGB 12.7* 11.2* 10.5*  HCT 38.1* 35.1* 30.6*  PLT 44* 36* 31*   Recent Labs  Lab 02/07/19 1247 02/08/19 2353 02/09/19 0415 02/10/19 0430  NA 132* 136 135 133*  K 3.6 3.4* 3.6 3.3*  CL 98 100 103 101  CO2 28 28 18* 23  BUN 40* 29* 30* 36*  CREATININE 1.83* 1.59* 1.51* 1.68*  CALCIUM 7.8* 8.6* 8.1* 8.2*  PROT 5.5* 6.9 5.6*  --   BILITOT 20.5* 19.8* 16.6*  --   ALKPHOS 139* 163* 119  --   ALT 40 55* 40  --   AST 86* 112* 95*  --   GLUCOSE 146* 94 100* 108*   5/14 - LA: 1.9 5/15 - INR: 3.3  Imaging/Diagnostic Tests:  5/14 - CT Abd/Plv: IMPRESSION: 1. Hepatic cirrhosis with small volume ascites. 2. Splenomegaly and small caliber perisplenic varices, consistent with portal hypertension. 3. Small right pleural effusion.  5/14 - IR Abd [place] Limited: IMPRESSION: Trace amount of perihepatic ascites, too small to allow for safe ultrasound-guided paracenteses.  Nuala Alpha, DO 02/11/2019, 6:38 AM PGY-2, [place] Intern pager: [phone], text pages welcome

## 2019-02-12 DIAGNOSIS — K652 Spontaneous bacterial peritonitis: Secondary | ICD-10-CM | POA: Diagnosis not present

## 2019-02-12 DIAGNOSIS — N390 Urinary tract infection, site not specified: Secondary | ICD-10-CM | POA: Diagnosis not present

## 2019-02-12 DIAGNOSIS — K7030 Alcoholic cirrhosis of liver without ascites: Secondary | ICD-10-CM | POA: Diagnosis not present

## 2019-02-12 LAB — COMPREHENSIVE METABOLIC PANEL
ALT: 41 U/L (ref 0–44)
AST: 91 U/L — ABNORMAL HIGH (ref 15–41)
Albumin: 1.2 g/dL — ABNORMAL LOW (ref 3.5–5.0)
Alkaline Phosphatase: 137 U/L — ABNORMAL HIGH (ref 38–126)
Anion gap: 13 (ref 5–15)
BUN: 24 mg/dL — ABNORMAL HIGH (ref 6–20)
CO2: 24 mmol/L (ref 22–32)
Calcium: 8.2 mg/dL — ABNORMAL LOW (ref 8.9–10.3)
Chloride: 95 mmol/L — ABNORMAL LOW (ref 98–111)
Creatinine, Ser: 1.28 mg/dL — ABNORMAL HIGH (ref 0.61–1.24)
GFR calc Af Amer: >60 mL/min (ref >60)
GFR calc non Af Amer: >60 mL/min (ref >60)
Glucose, Bld: 81 mg/dL (ref 70–99)
Potassium: 2.6 mmol/L — CL (ref 3.5–5.1)
Sodium: 132 mmol/L — ABNORMAL LOW (ref 135–145)
Total Bilirubin: 16.8 mg/dL — ABNORMAL HIGH (ref 0.3–1.2)
Total Protein: 5.8 g/dL — ABNORMAL LOW (ref 6.5–8.1)

## 2019-02-12 LAB — CBC
HCT: 30.9 % — ABNORMAL LOW (ref 39.0–52.0)
Hemoglobin: 10.5 g/dL — ABNORMAL LOW (ref 13.0–17.0)
MCH: 34 pg (ref 26.0–34.0)
MCHC: 34 g/dL (ref 30.0–36.0)
MCV: 100 fL (ref 80.0–100.0)
Platelets: 39 10*3/uL — ABNORMAL LOW (ref 150–400)
RBC: 3.09 MIL/uL — ABNORMAL LOW (ref 4.22–5.81)
RDW: 14.5 % (ref 11.5–15.5)
WBC: 11.6 10*3/uL — ABNORMAL HIGH (ref 4.0–10.5)
nRBC: 0.2 % (ref 0.0–0.2)

## 2019-02-12 LAB — BASIC METABOLIC PANEL
Anion gap: 11 (ref 5–15)
BUN: 24 mg/dL — ABNORMAL HIGH (ref 6–20)
CO2: 23 mmol/L (ref 22–32)
Calcium: 8 mg/dL — ABNORMAL LOW (ref 8.9–10.3)
Chloride: 95 mmol/L — ABNORMAL LOW (ref 98–111)
Creatinine, Ser: 1.3 mg/dL — ABNORMAL HIGH (ref 0.61–1.24)
GFR calc Af Amer: >60 mL/min (ref >60)
GFR calc non Af Amer: >60 mL/min (ref >60)
Glucose, Bld: 117 mg/dL — ABNORMAL HIGH (ref 70–99)
Potassium: 3.1 mmol/L — ABNORMAL LOW (ref 3.5–5.1)
Sodium: 129 mmol/L — ABNORMAL LOW (ref 135–145)

## 2019-02-12 LAB — MAGNESIUM: Magnesium: 1.4 mg/dL — ABNORMAL LOW (ref 1.7–2.4)

## 2019-02-12 MED ORDER — ONDANSETRON HCL 4 MG PO TABS: 4.0000 mg | ORAL_TABLET | Freq: Three times a day (TID) | ORAL | Status: DC | PRN

## 2019-02-12 MED ORDER — POTASSIUM CHLORIDE CRYS ER 20 MEQ PO TBCR
40.0000 meq | EXTENDED_RELEASE_TABLET | Freq: Three times a day (TID) | ORAL | Status: DC
  Administered 2019-02-12 (×2): 40 meq via ORAL
  Filled 2019-02-12 (×2): qty 2

## 2019-02-12 MED ORDER — POTASSIUM CHLORIDE CRYS ER 20 MEQ PO TBCR
40.0000 meq | EXTENDED_RELEASE_TABLET | Freq: Once | ORAL | Status: AC
  Administered 2019-02-12: 40 meq via ORAL
  Filled 2019-02-12: qty 2

## 2019-02-12 MED ORDER — ONDANSETRON HCL 4 MG/2ML IJ SOLN: 4.0000 mg | Freq: Three times a day (TID) | INTRAMUSCULAR | Status: DC | PRN

## 2019-02-12 MED ORDER — GERHARDT'S BUTT CREAM
TOPICAL_CREAM | Freq: Three times a day (TID) | CUTANEOUS | Status: DC
  Administered 2019-02-13 – 2019-02-14 (×5): via TOPICAL
  Filled 2019-02-12 (×2): qty 1

## 2019-02-12 MED ORDER — MAGNESIUM SULFATE 4 GM/100ML IV SOLN
4.0000 g | Freq: Once | INTRAVENOUS | Status: AC
  Administered 2019-02-12: 4 g via INTRAVENOUS
  Filled 2019-02-12: qty 100

## 2019-02-12 NOTE — Progress Notes (Signed)
Family Medicine Teaching Service Daily Progress Note Intern Pager: [phone]  Patient name: [name] Medical record number: [id number] Date of birth: [birth year] Age: 38 y.o. Gender: male  Primary Care Provider: Nicholes Rough, PA-C Consultants:  Code Status: full  Pt Overview and Major Events to Date:  5/14 - started CTX, urine culture positive for pansensitive E coli 5/14-5/18 - continued CTX for UTI and likely SBP  Assessment and Plan: [name] is a 38 y.o. male presenting with lower abdominal pain. PMH is significant for hepatic cirrhosis, chronic thrombocytopenia, history of hepatitis C, malnutrition, and recent admission on 4/25 for UTI and E.coli bacteremia.  Epigastric pain - improved  Possibly related to his cirrhosis/liver failure, could also be due to SBP.  Threshold for SBP is low given the extent of patient's cirrhosis and chronic illness.  Continuing with CTX until 5/18 to cover for both his UTI and presumed SBP.  WBC stable 13.7>11.6>12.2. Has continued to remain afebrile. - Finish IV CTX - day 5/5 (5/14-5/18) - monitor fever curve and WBC  UTI with possible concern for developing Sepsis >100k GNR E. Coli pan sensitive. WBC stable 13.7>11.6>12.2. Finishing day 5 of IV CTX today. Recently treated last month for E.coli UTI with keflex x 7days. Afebrile but blood pressure decreasing since admission, requiring fluid boluses.  His low BP is likely connected to his cirrhosis rather than to his acute illness. BP this AM stable: 108/73. - monitor BP - Finish day of IV CTX today (5/14-5/18) - Plan to transition to PO Keflex tomorrow for total 10 day course (5/19-5/23)  Hypokalemia:  K 2.6>3.1>2.8 this Am. Mag 1.4>1.7 s/p 4g Mag yesterday. Likely 2/2 to diuresis and chronic liver disease. Denies any chest pain, SOB. HR 77. - s/p 38mEq K-dur this AM   - give 29mEq k-dur x 3 - give 2g Mag - follow up K at 1700 - daily BMP - replace electrolytes as  needed  Hyponatremia:  Na 132>129>129. Likely 2/2 to diuresis. 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 Home meds: Lactulose 2 Tbls BID, Rifaximin 550mg  BID, Propranolol 20mg  BID, Folate 1mg  QD, Spironolactone 50mg  QD. 1+ LE pitting edema bilaterally. Wt down 130>128kg. Dry wt: 113kg.  5.4L UOP overnight with a  net 3.9L. Low sodium and K this AM likely from increase in diuresis. No bowel movements yesterday. - Cont to avoid all EtOH consumption on discharge - holding propanolol given low blood pressures - Cont home Rifaximin and Spironolactone - Continue Torsemide 20mg  QD given still significantly above dry wt - monitor electrolytes and replace as needed - Consider increase in Lactulose if no BM today, however continue at decreased dose of lactulose at 7.5g BID at this time - low sodium diet  - avoid hepatotoxic meds   H/o Rectal Bleeding: improved Likely 2/2 to hemorrhoids. Denies any melena or hematochezia. Hgb stable at 10.3. - continue to monitor  Thrombocytopenia: chronic, stable Platelets stable 34K>33.  Secondary to end stage liver disease.  - SCD's - CBC daily  Malnutrition  Albumin 1.6 (down from 2.0 at last admission). - Continue Ensure and MTVI  Prolonged QTc: QTc 474 on EKG on admission. Denies any chest pain. - avoid QT prolonging agents other than zofran PRN  FEN/GI: Low sodium diet Prophylaxis: SCDs  Disposition: home  Subjective:  Patient notes feeling well this AM. Tolerating PO. Has not had a bowel movement yesterday.  Denies any abdominal pain, nausea, vomiting.   Objective: Temp:  [97.4 F (36.3 C)-98.4 F (36.9 C)] 97.4 F (36.3 C) (05/18 0503) Pulse Rate:  [69-77] 77 (05/18 0648) Resp:  [18-20] 18 (05/18 0503) BP: (102-117)/(52-73) 108/73 (05/18 0648) SpO2:  [92 %-97 %] 97 % (05/18 0503) Weight:  [802 kg] 128 kg (05/18 0503) Physical  Exam: General: pleasant gentleman, in no acute distress with non-toxic appearance, lying comfortably in bed,  HEENT: normocephalic, atraumatic, moist mucous membranes, very jaundiced with pronounced scleral iscterus Neck: supple, normal ROM CV: regular rate and rhythm without murmurs, rubs, or gallops, 1+ pitting edema to shin bilaterally, 2+ radial and pedal pulses bilaterally Lungs: clear to auscultation bilaterally with normal work of breathing Abdomen: soft, nontender, normoactive bowel sounds Skin: warm, dry, jaundice with scleral icterus Extremities: warm and well perfused Neuro: Alert and orientedx3, speech normal  Laboratory: Recent Labs  Lab 02/11/19 0649 02/12/19 0626 02/13/19 0326  WBC 13.7* 11.6* 12.2*  HGB 10.4* 10.5* 10.3*  HCT 30.6* 30.9* 30.1*  PLT 34* 39* 33*   Recent Labs  Lab 02/08/19 2353 02/09/19 0415  02/12/19 0626 02/12/19 1416 02/13/19 0326  NA 136 135   < > 132* 129* 129*  K 3.4* 3.6   < > 2.6* 3.1* 2.8*  CL 100 103   < > 95* 95* 95*  CO2 28 18*   < > 24 23 26   BUN 29* 30*   < > 24* 24* 23*  CREATININE 1.59* 1.51*   < > 1.28* 1.30* 1.30*  CALCIUM 8.6* 8.1*   < > 8.2* 8.0* 7.7*  PROT 6.9 5.6*  --  5.8*  --   --   BILITOT 19.8* 16.6*  --  16.8*  --   --   ALKPHOS 163* 119  --  137*  --   --   ALT 55* 40  --  41  --   --   AST 112* 95*  --  91*  --   --   GLUCOSE 94 100*   < > 81 117* 105*   < > = values in this interval not displayed.   5/14 - LA: 1.9 5/15 - INR: 3.3  Imaging/Diagnostic Tests:  5/14 - CT Abd/Plv: IMPRESSION: 1. Hepatic cirrhosis with small volume ascites. 2. Splenomegaly and small caliber perisplenic varices, consistent with portal hypertension. 3. Small right pleural effusion.  5/14 - IR Abd [place] Limited: IMPRESSION: Trace amount of perihepatic ascites, too small to allow for safe ultrasound-guided paracenteses.  Mina Marble Antares, DO 02/13/2019, 8:45 AM PGY-1, [facility] Intern pager: [phone],  text pages welcome

## 2019-02-12 NOTE — Progress Notes (Signed)
CRITICAL VALUE ALERT  Critical Value:  Potassium 2.65   Date & Time Notied:  02/12/2019 0753   Provider Notified: Meccariello  Orders Received/Actions taken: replete

## 2019-02-12 NOTE — Consult Note (Signed)
[facility] Nurse wound consult note Reason for Consult: Suspected fungal overgrowth in the perineal area due to antibiotic use Wound type: Moisture associated skin damage Pressure Injury POA: NA Measurement:N/A Wound CXK:GYJE excoriation (linear) from scratching (partial thickness) Drainage (amount, consistency, odor) None Periwound: erythematous presentation with scattered satellite lesions Dressing procedure/placement/frequency: Nursing staff has been using topical OTC miconazole powder, area is resolving, but is dry and patient is itching which is deleterious to skin integrity.  Will provide topical that is compounded by pharmacy and consists of hydrocortisone/lotrimin/zinc oxide (Gerhart's Butt Cream). I have given Nursing guidance for patient to lie on side with pillow between knees if tolerated to promote airflow to area.  If desired, consider a one-time dose of oral or IV antifungal (diflucan) for more expeditious resolution.  [facility] nursing team will not follow, but will remain available to this patient, the nursing and medical teams.  Please re-consult if needed. Thanks, Maudie Flakes, MSN, RN, [place], Arther Abbott  Pager# [phone]

## 2019-02-12 NOTE — Progress Notes (Addendum)
Family Medicine Teaching Service Daily Progress Note Intern Pager: [phone]  Patient name: [name] Medical record number: [id number] Date of birth: 02/01/1981 Age: 38 y.o. Gender: male  Primary Care Provider: Nicholes Rough, PA-C Consultants:  Code Status: full  Pt Overview and Major Events to Date:  5/14 - started CTX, urine culture positive for pansensitive E coli 5/14-5/18 - continued CTX for UTI and likely SBP  Assessment and Plan:  [name] is a 38 y.o. male presenting with lower abdominal pain. PMH is significant for hepatic cirrhosis, chronic thrombocytopenia, history of hepatitis C, malnutrition, and recent admission on 4/25 for UTI and E.coli bacteremia.  Epigastric pain - acute, improving. Possibly related to his cirrhosis/liver failure, could also be due to SBP.  Threshold for SBP is low given the extent of patient's cirrhosis and chronic illness.  Continuing with CTX until 5/18 to cover for both his UTI and presumed SBP.  WBC trending down from 13.7 to 11.6 on 5/17.  Has been afebrile over the past 24 hours. - CTX - day 4/5 (5/14-5/18) - monitor fever curve and WBC  UTI with possible concern for developing Sepsis >100k GNR E. Coli pan sensitive. Wbc jumped from 5.8 to 13.7 on 5/15, but trending down on 5/17.  Currently treating with rocephin.  Recently treated last month for E.coli UTI with keflex x 7days. Afebrile but blood pressure decreasing since admission, requiring fluid boluses.  His low BP is likely connected to his cirrhosis rather than to his acute illness. - monitor BP - will be covered by the CTX IV; can convert to Keflex after 5 days IV  Hypokalemia: K 2.6 on 5.17 - Replete with Kdur 40 mEq x 2 - repeat BMP at 1400 - check Mg  Prolonged QTc: QTc 474 on EKG. Denies any chest pain. - repeat EKG - avoid QT prolonging agents other than zofran PRN  Hepatic Cirrhosis  H/o Hepatitis C  Ethanol Cirrhosis: improved MELD score: 34 with  52.6% 3 month mortality rate. Denies any further alcohol use since last admission. Labs consistent with end stage liver disease with hyperbilirubinemia (19.8), thrombocytopenia (44), hypoalbuminemia (1.6), and elevated transaminases (AST/ALT 112/44) with most labs slightly improved from last admission. Patient seen by GI during last admission; GI was consulted during this admission and declined to see patient since they thought SBP was unlikely. PT/INR elevated but at baseline for patient. Electrolytes stable. Patient also has history of hepatitis C with unconfirmed treatment. Home meds: Lactulose 2 Tbls BID, Rifaximin 550mg  BID, Propranolol 20mg  BID, Folate 1mg  QD, Spironolactone 50mg  QD Even though his overall labs show some small improvement, he remains a very high mortality risk which was discussed with patient on admission.  - Cont to avoid all EtOH consumption on discharge - holding propanolol given low blood pressures - Cont Rifaximin - decreased lactulose to 7.5g BID d/t increased diarrhea - torsemide 20 mg daily, spironolactone 50 mg daily - low sodium diet  - avoid hepatotoxic meds   H/o Rectal Bleeding: improved Likely 2/2 to hemorrhoids. Denies any melena or hematochezia. Hgb stable at 12.7>10.5>10.4. - continue to monitor  Thrombocytopenia: chronic, stable Platelets 44k > 31k > 34k, . Secondary to end stage liver disease.  - SCD's - CBC daily  Malnutrition  Albumin 1.6 (down from 2.0 at last admission). Nutrition consulted [place] admission and recommend Ensure and MTVI - Continue Ensure and MTVI  FEN/GI: Low sodium diet Prophylaxis: SCDs  Disposition: home  Subjective:  Patient says he continues  to feel better this morning and has had no nausea since yesterday afternoon.  He has some abdominal pain when moving around but overall, his abdominal pain is improving.  Objective: Temp:  [98 F (36.7 C)-98.4 F (36.9 C)] 98.3 F (36.8 C) (05/17 0433) Pulse Rate:   [64-74] 64 (05/17 0433) Resp:  [16-20] 16 (05/17 0433) BP: (108-128)/(55-74) 108/63 (05/17 0433) SpO2:  [94 %-99 %] 94 % (05/17 0433) Weight:  [130.1 kg] 130.1 kg (05/17 0437) Physical Exam: Gen: Pleasant gentleman lying comfortably in bed, appears very jaundiced with pronounced scleral icterus CV: RRR, no M/R/G Resp: CTAB, no wheezing, rales, or rhonchi, comfortable work of breathing Abd: obese, hepatomegaly, tender to palpation in the left upper quadrant and right upper quadrant, R>L, patient reports less tenderness than on previous exams Ext: Warm and well-perfused  Laboratory: Recent Labs  Lab 02/10/19 0430 02/11/19 0649 02/12/19 0626  WBC 13.7* 13.7* 11.6*  HGB 10.5* 10.4* 10.5*  HCT 30.6* 30.6* 30.9*  PLT 31* 34* 39*   Recent Labs  Lab 02/08/19 2353 02/09/19 0415 02/10/19 0430 02/12/19 0626  NA 136 135 133* 132*  K 3.4* 3.6 3.3* 2.6*  CL 100 103 101 95*  CO2 28 18* 23 24  BUN 29* 30* 36* 24*  CREATININE 1.59* 1.51* 1.68* 1.28*  CALCIUM 8.6* 8.1* 8.2* 8.2*  PROT 6.9 5.6*  --  5.8*  BILITOT 19.8* 16.6*  --  16.8*  ALKPHOS 163* 119  --  137*  ALT 55* 40  --  41  AST 112* 95*  --  91*  GLUCOSE 94 100* 108* 81   5/14 - LA: 1.9 5/15 - INR: 3.3  Imaging/Diagnostic Tests:  5/14 - CT Abd/Plv: IMPRESSION: 1. Hepatic cirrhosis with small volume ascites. 2. Splenomegaly and small caliber perisplenic varices, consistent with portal hypertension. 3. Small right pleural effusion.  5/14 - IR Abd [place] Limited: IMPRESSION: Trace amount of perihepatic ascites, too small to allow for safe ultrasound-guided paracenteses.  Kathrene Alu, MD 02/12/2019, 8:02 AM PGY-2, [facility] Intern pager: [phone], text pages welcome

## 2019-02-12 NOTE — Progress Notes (Signed)
Called patient's brother, Burney Calzadilla, to update him about the patient's progress.  Told him that the patient continues to improve and will finish up his IV antibiotic tomorrow, 5/18.  I told him that if all goes well, he may be able to be discharged tomorrow.  He said that he will make sure that a family member is available to pick him up in the mid-late afternoon tomorrow if he is discharged.  Calvert Charland C. Shan Levans, MD PGY-2, [facility] 02/12/2019 12:24 PM

## 2019-02-13 DIAGNOSIS — D473 Essential (hemorrhagic) thrombocythemia: Secondary | ICD-10-CM | POA: Diagnosis not present

## 2019-02-13 DIAGNOSIS — K7030 Alcoholic cirrhosis of liver without ascites: Secondary | ICD-10-CM | POA: Diagnosis not present

## 2019-02-13 DIAGNOSIS — N390 Urinary tract infection, site not specified: Secondary | ICD-10-CM | POA: Diagnosis not present

## 2019-02-13 DIAGNOSIS — K652 Spontaneous bacterial peritonitis: Secondary | ICD-10-CM | POA: Diagnosis not present

## 2019-02-13 LAB — BASIC METABOLIC PANEL
Anion gap: 8 (ref 5–15)
Anion gap: 9 (ref 5–15)
BUN: 23 mg/dL — ABNORMAL HIGH (ref 6–20)
BUN: 20 mg/dL (ref 6–20)
CO2: 26 mmol/L (ref 22–32)
CO2: 28 mmol/L (ref 22–32)
Calcium: 7.7 mg/dL — ABNORMAL LOW (ref 8.9–10.3)
Calcium: 8 mg/dL — ABNORMAL LOW (ref 8.9–10.3)
Chloride: 95 mmol/L — ABNORMAL LOW (ref 98–111)
Chloride: 93 mmol/L — ABNORMAL LOW (ref 98–111)
Creatinine, Ser: 1.3 mg/dL — ABNORMAL HIGH (ref 0.61–1.24)
Creatinine, Ser: 1.31 mg/dL — ABNORMAL HIGH (ref 0.61–1.24)
GFR calc Af Amer: >60 mL/min (ref >60)
GFR calc Af Amer: >60 mL/min (ref >60)
GFR calc non Af Amer: >60 mL/min (ref >60)
GFR calc non Af Amer: >60 mL/min (ref >60)
Glucose, Bld: 105 mg/dL — ABNORMAL HIGH (ref 70–99)
Glucose, Bld: 121 mg/dL — ABNORMAL HIGH (ref 70–99)
Potassium: 2.8 mmol/L — ABNORMAL LOW (ref 3.5–5.1)
Potassium: 3.1 mmol/L — ABNORMAL LOW (ref 3.5–5.1)
Sodium: 129 mmol/L — ABNORMAL LOW (ref 135–145)
Sodium: 130 mmol/L — ABNORMAL LOW (ref 135–145)

## 2019-02-13 LAB — CBC
HCT: 30.1 % — ABNORMAL LOW (ref 39.0–52.0)
Hemoglobin: 10.3 g/dL — ABNORMAL LOW (ref 13.0–17.0)
MCH: 34.7 pg — ABNORMAL HIGH (ref 26.0–34.0)
MCHC: 34.2 g/dL (ref 30.0–36.0)
MCV: 101.3 fL — ABNORMAL HIGH (ref 80.0–100.0)
Platelets: 33 10*3/uL — ABNORMAL LOW (ref 150–400)
RBC: 2.97 MIL/uL — ABNORMAL LOW (ref 4.22–5.81)
RDW: 14.6 % (ref 11.5–15.5)
WBC: 12.2 10*3/uL — ABNORMAL HIGH (ref 4.0–10.5)
nRBC: 0 % (ref 0.0–0.2)

## 2019-02-13 LAB — MAGNESIUM
Magnesium: 1.7 mg/dL (ref 1.7–2.4)
Magnesium: 1.8 mg/dL (ref 1.7–2.4)

## 2019-02-13 LAB — PHOSPHORUS: Phosphorus: 4 mg/dL (ref 2.5–4.6)

## 2019-02-13 MED ORDER — POTASSIUM CHLORIDE CRYS ER 20 MEQ PO TBCR
40.0000 meq | EXTENDED_RELEASE_TABLET | Freq: Once | ORAL | Status: AC
  Administered 2019-02-13: 40 meq via ORAL
  Filled 2019-02-13: qty 2

## 2019-02-13 MED ORDER — TORSEMIDE 20 MG PO TABS
20.0000 mg | ORAL_TABLET | Freq: Every day | ORAL | Status: DC
  Administered 2019-02-13 – 2019-02-14 (×2): 20 mg via ORAL
  Filled 2019-02-13 (×2): qty 1

## 2019-02-13 MED ORDER — POTASSIUM CHLORIDE CRYS ER 20 MEQ PO TBCR: 40.0000 meq | EXTENDED_RELEASE_TABLET | Freq: Three times a day (TID) | ORAL | Status: DC

## 2019-02-13 MED ORDER — POTASSIUM CHLORIDE CRYS ER 20 MEQ PO TBCR
40.0000 meq | EXTENDED_RELEASE_TABLET | Freq: Two times a day (BID) | ORAL | Status: DC
  Administered 2019-02-13: 40 meq via ORAL
  Filled 2019-02-13: qty 2

## 2019-02-13 MED ORDER — TORSEMIDE 20 MG PO TABS: 20.0000 mg | ORAL_TABLET | Freq: Every day | ORAL | Status: DC

## 2019-02-13 MED ORDER — CEPHALEXIN 250 MG PO CAPS
500.0000 mg | ORAL_CAPSULE | Freq: Four times a day (QID) | ORAL | Status: DC
  Administered 2019-02-14 (×3): 500 mg via ORAL
  Filled 2019-02-13 (×3): qty 2

## 2019-02-13 MED ORDER — MAGNESIUM SULFATE 2 GM/50ML IV SOLN
2.0000 g | Freq: Once | INTRAVENOUS | Status: AC
  Administered 2019-02-13: 2 g via INTRAVENOUS
  Filled 2019-02-13: qty 50

## 2019-02-13 MED ORDER — POTASSIUM CHLORIDE CRYS ER 20 MEQ PO TBCR
40.0000 meq | EXTENDED_RELEASE_TABLET | Freq: Three times a day (TID) | ORAL | Status: DC
  Administered 2019-02-13: 40 meq via ORAL
  Filled 2019-02-13: qty 2

## 2019-02-14 ENCOUNTER — Telehealth: Payer: Self-pay | Admitting: Internal Medicine

## 2019-02-14 ENCOUNTER — Telehealth: Payer: Self-pay | Admitting: *Deleted

## 2019-02-14 DIAGNOSIS — N390 Urinary tract infection, site not specified: Secondary | ICD-10-CM | POA: Diagnosis not present

## 2019-02-14 DIAGNOSIS — K7030 Alcoholic cirrhosis of liver without ascites: Secondary | ICD-10-CM | POA: Diagnosis not present

## 2019-02-14 DIAGNOSIS — K652 Spontaneous bacterial peritonitis: Secondary | ICD-10-CM | POA: Diagnosis not present

## 2019-02-14 DIAGNOSIS — A419 Sepsis, unspecified organism: Secondary | ICD-10-CM | POA: Diagnosis not present

## 2019-02-14 LAB — BASIC METABOLIC PANEL
Anion gap: 11 (ref 5–15)
Anion gap: 10 (ref 5–15)
BUN: 20 mg/dL (ref 6–20)
BUN: 16 mg/dL (ref 6–20)
CO2: 28 mmol/L (ref 22–32)
CO2: 27 mmol/L (ref 22–32)
Calcium: 7.8 mg/dL — ABNORMAL LOW (ref 8.9–10.3)
Calcium: 8.1 mg/dL — ABNORMAL LOW (ref 8.9–10.3)
Chloride: 91 mmol/L — ABNORMAL LOW (ref 98–111)
Chloride: 92 mmol/L — ABNORMAL LOW (ref 98–111)
Creatinine, Ser: 1.17 mg/dL (ref 0.61–1.24)
Creatinine, Ser: 1.1 mg/dL (ref 0.61–1.24)
GFR calc Af Amer: >60 mL/min (ref >60)
GFR calc Af Amer: >60 mL/min (ref >60)
GFR calc non Af Amer: >60 mL/min (ref >60)
GFR calc non Af Amer: >60 mL/min (ref >60)
Glucose, Bld: 88 mg/dL (ref 70–99)
Glucose, Bld: 115 mg/dL — ABNORMAL HIGH (ref 70–99)
Potassium: 2.9 mmol/L — ABNORMAL LOW (ref 3.5–5.1)
Potassium: 3.1 mmol/L — ABNORMAL LOW (ref 3.5–5.1)
Sodium: 130 mmol/L — ABNORMAL LOW (ref 135–145)
Sodium: 129 mmol/L — ABNORMAL LOW (ref 135–145)

## 2019-02-14 LAB — CULTURE, BLOOD (ROUTINE X 2)
Culture: NO GROWTH
Culture: NO GROWTH
Special Requests: ADEQUATE

## 2019-02-14 LAB — CBC
HCT: 31.3 % — ABNORMAL LOW (ref 39.0–52.0)
Hemoglobin: 10.7 g/dL — ABNORMAL LOW (ref 13.0–17.0)
MCH: 34.1 pg — ABNORMAL HIGH (ref 26.0–34.0)
MCHC: 34.2 g/dL (ref 30.0–36.0)
MCV: 99.7 fL (ref 80.0–100.0)
Platelets: 31 10*3/uL — ABNORMAL LOW (ref 150–400)
RBC: 3.14 MIL/uL — ABNORMAL LOW (ref 4.22–5.81)
RDW: 14.5 % (ref 11.5–15.5)
WBC: 11.2 10*3/uL — ABNORMAL HIGH (ref 4.0–10.5)
nRBC: 0 % (ref 0.0–0.2)

## 2019-02-14 LAB — MAGNESIUM: Magnesium: 1.7 mg/dL (ref 1.7–2.4)

## 2019-02-14 MED ORDER — PANTOPRAZOLE SODIUM 40 MG PO TBEC: 40.0000 mg | DELAYED_RELEASE_TABLET | ORAL | 3 refills | Status: DC

## 2019-02-14 MED ORDER — ZINC OXIDE 40 % EX OINT: TOPICAL_OINTMENT | Freq: Two times a day (BID) | CUTANEOUS | 0 refills | Status: DC | PRN

## 2019-02-14 MED ORDER — MAGNESIUM OXIDE 400 MG PO TABS: 400.0000 mg | ORAL_TABLET | Freq: Every day | ORAL | 0 refills | Status: DC

## 2019-02-14 MED ORDER — POTASSIUM CHLORIDE ER 10 MEQ PO TBCR: 40.0000 meq | EXTENDED_RELEASE_TABLET | Freq: Two times a day (BID) | ORAL | 0 refills | Status: DC

## 2019-02-14 MED ORDER — MAGNESIUM SULFATE 4 GM/100ML IV SOLN
4.0000 g | Freq: Once | INTRAVENOUS | Status: AC
  Administered 2019-02-14: 4 g via INTRAVENOUS
  Filled 2019-02-14: qty 100

## 2019-02-14 MED ORDER — TORSEMIDE 20 MG PO TABS: 20.0000 mg | ORAL_TABLET | Freq: Every day | ORAL | 0 refills | Status: DC

## 2019-02-14 MED ORDER — GERHARDT'S BUTT CREAM: 1.0000 "application " | TOPICAL_CREAM | Freq: Three times a day (TID) | CUTANEOUS | 0 refills | Status: DC

## 2019-02-14 MED ORDER — POTASSIUM CHLORIDE CRYS ER 20 MEQ PO TBCR
40.0000 meq | EXTENDED_RELEASE_TABLET | Freq: Once | ORAL | Status: AC
  Administered 2019-02-14: 40 meq via ORAL
  Filled 2019-02-14: qty 2

## 2019-02-14 MED ORDER — POTASSIUM CHLORIDE ER 10 MEQ PO TBCR: 40.0000 meq | EXTENDED_RELEASE_TABLET | Freq: Every day | ORAL | 0 refills | Status: DC

## 2019-02-14 MED ORDER — CEPHALEXIN 500 MG PO CAPS: 500.0000 mg | ORAL_CAPSULE | Freq: Four times a day (QID) | ORAL | 0 refills | Status: AC

## 2019-02-14 MED ORDER — FOLIC ACID 1 MG PO TABS: 1.0000 mg | ORAL_TABLET | Freq: Every day | ORAL | 1 refills | Status: DC

## 2019-02-14 MED ORDER — PROPRANOLOL HCL 20 MG PO TABS: 20.0000 mg | ORAL_TABLET | Freq: Two times a day (BID) | ORAL | 2 refills | Status: DC

## 2019-02-14 NOTE — Telephone Encounter (Signed)
I have sent proof of income as well as updated insurance card and pharmacy print out indicating how much xifaxan would cost patient over to the [place] appeals fax at [phone]. [facility] phone is [phone].

## 2019-02-14 NOTE — Discharge Instructions (Signed)
You were admitted to [facility] for abdominal pain and found to have a UTI.  We attributed your abdominal pain to a possible infection of the fluid within your abdomen.  You were treated with IV antibiotics and then transitioned to oral antibiotics.  It will be very important for you to finish these antibiotics as prescribed.  We have also made many changes to your medications.  It will be very important for you to follow the directions on the bottle.  Medications:  Keflex (antibiotic) 500mg : 1 pill every 6 hours for 4 days. Please take your next dose tonight. Spironolactone 50mg : 1 pill every day Torsemide 20mg : 1 pill every day  Folic Acid: Take 1 pill every day Lactulose: Take 15 mL two times a day Magnesium Oxide: take 1 tablet every day Potassium: Take 4 pills (50mEq)  twice a day for 4 days, then take 4 pills (30mEq) a day every day. Rifaximin 550mg : Take 1 pill two times a day   Please be sure to follow up with your PCP.

## 2019-02-14 NOTE — Telephone Encounter (Signed)
Pt's wife called back regarding questions about paperwork for assistance.   [phone]

## 2019-02-14 NOTE — Telephone Encounter (Signed)
Patient needs refills on pantoprazole and propranolol. Rx's sent. In addition, pharmacy apparently states they did not get our rx for folic acid that we sent on 02/07/19. I have recent this. I also advised Hope, patient's girlfriend that they need to be working to get proof of patient's income as well as pharmacy print out indicating how much patient would have to pay for xifaxan for patient assistance appeal that we have been working on. She verbalizes understanding.

## 2019-02-17 ENCOUNTER — Telehealth: Payer: Self-pay | Admitting: Internal Medicine

## 2019-02-17 LAB — HEPATITIS C VRS RNA DETECT BY PCR-QUAL: Hepatitis C Vrs RNA by PCR-Qual: NOT DETECTED

## 2019-02-17 NOTE — Telephone Encounter (Signed)
10:20am: Message left on voice mail of brother Preston's cell, leaving my contact information, and requesting a call back from patient (checking in). I also left a voice message on the cell of girlfriend [place].  Violeta Gelinas NP [place] [phone]

## 2019-02-21 ENCOUNTER — Other Ambulatory Visit: Payer: Self-pay

## 2019-02-21 ENCOUNTER — Ambulatory Visit (INDEPENDENT_AMBULATORY_CARE_PROVIDER_SITE_OTHER): Payer: No Typology Code available for payment source | Admitting: Internal Medicine

## 2019-02-21 ENCOUNTER — Telehealth: Payer: Self-pay | Admitting: Internal Medicine

## 2019-02-21 VITALS — Ht 71.0 in | Wt 270.0 lb

## 2019-02-21 DIAGNOSIS — Z8619 Personal history of other infectious and parasitic diseases: Secondary | ICD-10-CM

## 2019-02-21 DIAGNOSIS — K766 Portal hypertension: Secondary | ICD-10-CM

## 2019-02-21 DIAGNOSIS — K7030 Alcoholic cirrhosis of liver without ascites: Secondary | ICD-10-CM

## 2019-02-21 DIAGNOSIS — K7682 Hepatic encephalopathy: Secondary | ICD-10-CM

## 2019-02-21 DIAGNOSIS — R600 Localized edema: Secondary | ICD-10-CM

## 2019-02-21 DIAGNOSIS — K7290 Hepatic failure, unspecified without coma: Secondary | ICD-10-CM | POA: Diagnosis not present

## 2019-02-21 NOTE — Telephone Encounter (Signed)
Estill Bamberg called in requesting to speak with Dottie in regards to pt. She stated that she has been working with dottie and she is aware of the nature of the call.

## 2019-02-21 NOTE — Progress Notes (Signed)
Subjective:   This service was provided via telemedicine.  We attempted Zoom and while visual communication was established, audio could not be thus we changed to telephone visit The patient was located at home The provider was located in provider's GI office. The patient did consent to this telephone visit and is aware of possible charges through their insurance for this visit.   The persons participating in this telemedicine service were the patient and I. Time spent on call: 16 min    Patient ID: [name], male    DOB: 1981-08-11, 38 y.o.   MRN: [id number]  HPI [name] is a 38 year old male with a history of decompensated alcoholic cirrhosis, viral hepatitis C successfully treated, history of alcohol-related pancreatitis who is seen for follow-up.  He is seen virtually today in the setting of COVID-19.  He was last seen in the office on 02/07/2019.  Since his last visit with me he was admitted to the hospital with urosepsis.  This was treated successfully and he is now off antibiotics.  He was also seen by Roosevelt Locks, NP with [facility].  He was able to tell me nearly verbatim what she outlined in her note.  He told me that he needed to remain alcohol free, be tobacco free and enroll in an outpatient treatment program.  He is working to do this.  He is aware that she will see him again in 2 to 3 months.  He reports today he is doing really much better overall.  He has more energy.  He feels less tired and less fatigue.  He is able to accurately and specifically go over his medication use.  He denies abdominal pain.  No nausea or vomiting.  No blood in his stool or melena.  No itching.  He is still having some issue with lower extremity edema seemingly right somewhat greater than left lower extremity.  He reports he is urinating quite frequently as he is taking the following medicines: Torsemide 20 mg daily, spironolactone 50 mg daily, K-Dur 40 mEq once daily having  previously been twice daily until this past Sunday (2 days ago), magnesium 962 once a day, folic acid once a day and lactulose twice a day.  He is also on rifaximin 550 mg twice daily.  He has received 1 month of rifaximin samples, which I am here greatly appreciative for, and still is working to maintain more permanent drug assistance from the manufacturer.  He has an office visit with his primary care Nicholes Rough, PA-C on Friday.   Review of Systems As per HPI, otherwise negative  Current Medications, Allergies, Past Medical History, Past Surgical History, Family History and Social History were reviewed in [employer] record.     Objective:   Physical Exam No physical exam, virtual visit  CBC    Component Value Date/Time   WBC 11.2 (H) 02/14/2019 0321   RBC 3.14 (L) 02/14/2019 0321   HGB 10.7 (L) 02/14/2019 0321   HCT 31.3 (L) 02/14/2019 0321   PLT 31 (L) 02/14/2019 0321   MCV 99.7 02/14/2019 0321   MCH 34.1 (H) 02/14/2019 0321   MCHC 34.2 02/14/2019 0321   RDW 14.5 02/14/2019 0321   LYMPHSABS 2.3 02/10/2019 0430   MONOABS 2.4 (H) 02/10/2019 0430   EOSABS 0.3 02/10/2019 0430   BASOSABS 0.1 02/10/2019 0430   CMP     Component Value Date/Time   NA 129 (L) 02/14/2019 1228   K 3.1 (L)  02/14/2019 1228   CL 92 (L) 02/14/2019 1228   CO2 27 02/14/2019 1228   GLUCOSE 115 (H) 02/14/2019 1228   BUN 16 02/14/2019 1228   CREATININE 1.10 02/14/2019 1228   CREATININE 0.52 (L) 04/07/2017 1635   CALCIUM 8.1 (L) 02/14/2019 1228   PROT 5.8 (L) 02/12/2019 0626   ALBUMIN 1.2 (L) 02/12/2019 0626   AST 91 (H) 02/12/2019 0626   ALT 41 02/12/2019 0626   ALKPHOS 137 (H) 02/12/2019 0626   BILITOT 16.8 (H) 02/12/2019 0626   GFRNONAA >60 02/14/2019 1228   GFRAA >60 02/14/2019 1228   HCV viral load negative       Assessment & Plan:  38 year old male with a history of decompensated alcoholic cirrhosis, viral hepatitis C successfully treated, history of  alcohol-related pancreatitis who is seen for follow-up.   1.  Decompensated alcoholic cirrhosis --certainly seems to be doing better this week compared to 2 weeks ago.  He was treated for UTI and appears clinically stable.  I really appreciate the input of Roosevelt Locks, NP and her team.  I would like that to continue seeing [facility] should he need transplantation down the road. --Volume overload: Minimal ascites but lower extremity edema.  We will continue diuretics but need to watch renal function and electrolytes closely.  His sodium was low at discharge from hospital.  He will have labs later this week with primary care. ----Continue torsemide 20 mg and spironolactone 50 mg daily --Low-sodium diet --Continue K-Dur 40 mEq daily --Continue magnesium and folic acid supplementation --No evidence for hepatic encephalopathy now: Continue rifaximin 550 mg twice daily and lactulose twice a day to maintain 2-4 soft but formed stools daily --Continue alcohol abstinence and seek outpatient alcohol treatment program --Continue daily PPI --We need to discuss upper endoscopy for variceal screening likely in the outpatient hospital setting should banding be required; discussed this at follow-up  2.  History of viral hepatitis C --previously treated and now known to have been eradicated with negative viral load recently  3.  History of esophagitis and portal gastropathy --continue daily PPI  At primary care visit later this week I would asked that they check a CBC, CMP and INR  I would like to see Matt in person on 03/13/2019 at 3 PM

## 2019-02-21 NOTE — Telephone Encounter (Signed)
Per Estill Bamberg, all [facility] needs at this time is tax return from 2018, 2019 and they can get approval. In addition, supervisor has given approval for $30 day supply of medication for patient until we can get tax return faxed for patient.

## 2019-02-22 ENCOUNTER — Encounter: Payer: Self-pay | Admitting: Internal Medicine

## 2019-02-22 ENCOUNTER — Ambulatory Visit: Payer: No Typology Code available for payment source | Admitting: Internal Medicine

## 2019-02-22 NOTE — Addendum Note (Signed)
Addended by: Larina Bras on: 02/22/2019 09:16 AM   Modules accepted: Orders

## 2019-02-22 NOTE — Telephone Encounter (Signed)
I have spoken to Yvette to advise that I have spoken to [facility] patient assistance and she indicates that they have received patient's 2019 W2. Patient has been approved for patient assistance for Xifaxan from 02/22/19-02/22/20. Medication should go to his home address in 3-5 business days. Per [place], once patient gets down to about 18 days left of his 90 day supply of Xifaxan, he should call for more medication by pressing option 2 then 1 then 4 of the assistance line on his bottle. Patient verbalizes understanding of this and thanks [place] for our help with this.

## 2019-02-22 NOTE — Patient Instructions (Addendum)
Continue torsemide 20 mg and spironolactone 50 mg daily.  Follow your low-sodium diet (2 grams or less daily).  Continue K-Dur 40 mEq daily.  Continue magnesium and folic acid supplementation.  Continue rifaximin 550 mg twice daily and lactulose twice a day to maintain 2-4 soft but formed stools daily.  Continue alcohol abstinence and seek outpatient alcohol treatment program.  Continue pantoprazole daily.  At primary care visit later this week I would asked that they check a CBC, CMP and INR.  Please follow up with Dr Hilarie Fredrickson IN OFFICE on 03/13/2019 at 3:00 PM. Our office is located at [address] [place], [place] [address] (right across from [facility]). At this time, we ask that you have only 1 person to accompany you to your visit. Please wear a mask.

## 2019-03-07 ENCOUNTER — Ambulatory Visit: Payer: No Typology Code available for payment source | Admitting: Internal Medicine

## 2019-03-10 ENCOUNTER — Other Ambulatory Visit: Payer: Self-pay | Admitting: Family Medicine

## 2019-03-13 ENCOUNTER — Ambulatory Visit (INDEPENDENT_AMBULATORY_CARE_PROVIDER_SITE_OTHER): Payer: No Typology Code available for payment source | Admitting: Internal Medicine

## 2019-03-13 ENCOUNTER — Encounter: Payer: Self-pay | Admitting: Internal Medicine

## 2019-03-13 ENCOUNTER — Other Ambulatory Visit (INDEPENDENT_AMBULATORY_CARE_PROVIDER_SITE_OTHER): Payer: Self-pay

## 2019-03-13 VITALS — BP 102/60 | HR 92 | Ht 71.0 in | Wt 263.0 lb

## 2019-03-13 DIAGNOSIS — K7031 Alcoholic cirrhosis of liver with ascites: Secondary | ICD-10-CM

## 2019-03-13 DIAGNOSIS — K7290 Hepatic failure, unspecified without coma: Secondary | ICD-10-CM | POA: Diagnosis not present

## 2019-03-13 DIAGNOSIS — K7682 Hepatic encephalopathy: Secondary | ICD-10-CM

## 2019-03-13 DIAGNOSIS — K766 Portal hypertension: Secondary | ICD-10-CM | POA: Diagnosis not present

## 2019-03-13 DIAGNOSIS — R600 Localized edema: Secondary | ICD-10-CM | POA: Diagnosis not present

## 2019-03-13 DIAGNOSIS — K625 Hemorrhage of anus and rectum: Secondary | ICD-10-CM

## 2019-03-13 LAB — PROTIME-INR
INR: 2 ratio — ABNORMAL HIGH (ref 0.8–1.0)
Prothrombin Time: 23.4 s — ABNORMAL HIGH (ref 9.6–13.1)

## 2019-03-13 LAB — CBC WITH DIFFERENTIAL/PLATELET
Basophils Absolute: 0.1 10*3/uL (ref 0.0–0.1)
Basophils Relative: 1.4 % (ref 0.0–3.0)
Eosinophils Absolute: 0.1 10*3/uL (ref 0.0–0.7)
Eosinophils Relative: 1.2 % (ref 0.0–5.0)
HCT: 32.7 % — ABNORMAL LOW (ref 39.0–52.0)
Hemoglobin: 11.2 g/dL — ABNORMAL LOW (ref 13.0–17.0)
Lymphocytes Relative: 19.6 % (ref 12.0–46.0)
Lymphs Abs: 1.9 10*3/uL (ref 0.7–4.0)
MCHC: 34.2 g/dL (ref 30.0–36.0)
MCV: 100.1 fl — ABNORMAL HIGH (ref 78.0–100.0)
Monocytes Absolute: 1.5 10*3/uL — ABNORMAL HIGH (ref 0.1–1.0)
Monocytes Relative: 15.4 % — ABNORMAL HIGH (ref 3.0–12.0)
Neutro Abs: 5.9 10*3/uL (ref 1.4–7.7)
Neutrophils Relative %: 62.4 % (ref 43.0–77.0)
Platelets: 70 10*3/uL — ABNORMAL LOW (ref 150.0–400.0)
RBC: 3.26 Mil/uL — ABNORMAL LOW (ref 4.22–5.81)
RDW: 15.9 % — ABNORMAL HIGH (ref 11.5–15.5)
WBC: 9.4 10*3/uL (ref 4.0–10.5)

## 2019-03-13 NOTE — Patient Instructions (Addendum)
Due to recent COVID-19 restrictions implemented by our local and state authorities and in an effort to keep both patients and staff as safe as possible, our hospital system now requires COVID-19 testing prior to any scheduled hospital procedure. Please go to our [facility] location drive thru testing site (this is directly across from our office but on GPS will read an address of  [place], [place] [address]) on 03/16/2019, any time between 9 am - 3 pm. You will not be billed at the time of testing but may receive a bill later depending on your insurance. The approximate cost of the test is $100. You must agree to quarantine from the time of your testing until the procedure date on 03/20/2019 . This should include staying at home with ONLY the people you live with. Avoid take-out, grocery store shopping or leaving the house for any non-emergent reason. Please call our office at [phone] if you have any questions.   You have been scheduled for an endoscopy and colonoscopy. Please follow the written instructions given to you at your visit today. Please pick up your prep supplies at the pharmacy within the next 1-3 days. If you use inhalers (even only as needed), please bring them with you on the day of your procedure.  Your provider has requested that you go to the basement level for lab work before leaving today. Press "B" on the elevator. The lab is located at the first door on the left as you exit the elevator.  Continue torsemide 20 mg   Continue spironolactone 50 mg daily.  Continue folic acid supplementation daily.  Continue xifaxan 550 mg twice daily  Continue lactulose twice daily (titrate to where 2-3 soft formed stools daily)  Continue pantoprazole daily.  Continue to seek outpatient alcohol treatment program and follow up with [facility] liver clinic as directed.  You will be due for [facility] screening by ultrasound around 07/2019. We will be in touch with you when  it gets closer to that time with an appointment time/date.  Please follow up with Dr Hilarie Fredrickson in 3 months.

## 2019-03-13 NOTE — Addendum Note (Signed)
Addended by: Lanny Hurst A on: 03/13/2019 04:23 PM   Modules accepted: Orders, SmartSet

## 2019-03-13 NOTE — Progress Notes (Signed)
Subjective:    Patient ID: [name], male    DOB: [birth year], 38 y.o.   MRN: [id number]  HPI [name] is a 38 year old male with a history of decompensated alcoholic cirrhosis, successfully treated viral hepatitis C, history of alcohol-related pancreatitis who is seen for follow-up.  He is seen in person today and was last seen on 02/21/2019 by virtual visit.  He is here alone today.  He reports that recently he is doing and feeling well.  He has continued his torsemide and spironolactone and reports his lower extremity swelling has improved.  He also continues lactulose twice a day and is having at least 2 stools per day.  He takes rifaximin 550 mg twice daily.  No confusion, sleepiness.  He does not feel forgetful.  He is sleeping better at night and thus less sleepy during the day.  He is taking pantoprazole 40 mg a day.  He is also continuing his folic acid, magnesium supplementation and K-Dur.  He is seeing red blood with wiping and occasionally on the stool on most days.  This is painless in nature.  Good appetite.  No nausea or vomiting.  No heartburn.  No trouble swallowing.  No melena.  He has been completely alcohol abstinent.  He is living with his girlfriend.  He has close contact with his brother.   Review of Systems As per HPI, otherwise negative  Current Medications, Allergies, Past Medical History, Past Surgical History, Family History and Social History were reviewed in [employer] record.     Objective:   Physical Exam BP 102/60   Pulse 92   Ht 5\' 11"  (1.803 m)   Wt 263 lb (119.3 kg)   BMI 36.68 kg/m  Constitutional: Well-developed and well-nourished. No distress. HEENT: Normocephalic and atraumatic.  Conjunctivae are normal.  Sclera are icteric. Neck: Neck supple. Trachea midline. Cardiovascular: Normal rate, regular rhythm and intact distal pulses. No M/R/G Pulmonary/chest: Effort normal and breath sounds normal. No wheezing,  rales or rhonchi. Abdominal: Soft, nontender, nondistended. Bowel sounds active throughout. There are no masses palpable. No hepatosplenomegaly. Extremities: no clubbing, cyanosis, trace pretibial edema at the ankle Neurological: Alert and oriented to person place and time.  No asterixis Skin: Skin is warm and dry. Psychiatric: Normal mood and affect. Behavior is normal.  Labs ordered today, CBC, CMP and INR  CT ABDOMEN AND PELVIS WITH CONTRAST   TECHNIQUE: Multidetector CT imaging of the abdomen and pelvis was performed using the standard protocol following bolus administration of intravenous contrast.   CONTRAST:  164mL OMNIPAQUE IOHEXOL 300 MG/ML  SOLN   COMPARISON:  CT abdomen pelvis 11/10/2017   FINDINGS: LOWER CHEST: Small right pleural effusion   HEPATOBILIARY: Diffusely nodular hepatic contours with relative hypertrophy of the caudate and left hepatic lobe, consistent with hepatic cirrhosis. No focal liver lesion. No biliary dilatation. Mild gallbladder wall thickening and perihepatic ascites.   PANCREAS: The pancreatic parenchymal contours are normal and there is no ductal dilatation. There is no peripancreatic fluid collection.   SPLEEN: Splenomegaly, measuring 15.3 cm in craniocaudal dimension.   ADRENALS/URINARY TRACT:   --Adrenal glands: Normal.   --Right kidney/ureter: No hydronephrosis, nephroureterolithiasis, perinephric stranding or solid renal mass.   --Left kidney/ureter: No hydronephrosis, nephroureterolithiasis, perinephric stranding or solid renal mass.   --Urinary bladder: Normal for degree of distention   STOMACH/BOWEL:   --Stomach/Duodenum: There is no hiatal hernia or other gastric abnormality. The duodenal course and caliber are normal.   --Small bowel:  No dilatation or inflammation.   --Colon: No focal abnormality.   --Appendix: Normal.   VASCULAR/LYMPHATIC: Normal course and caliber of the major abdominal vessels. Perisplenic  varices. No abdominal or pelvic lymphadenopathy.   REPRODUCTIVE: Normal prostate size with symmetric seminal vesicles.   MUSCULOSKELETAL. No bony spinal canal stenosis or focal osseous abnormality.   OTHER: Anasarca.   IMPRESSION: 1. Hepatic cirrhosis with small volume ascites. 2. Splenomegaly and small caliber perisplenic varices, consistent with portal hypertension. 3. Small right pleural effusion.     Electronically Signed   By: Ulyses Jarred M.D.   On: 02/09/2019 02:40       Assessment & Plan:   38 year old male with a history of decompensated alcoholic cirrhosis, successfully treated viral hepatitis C, history of alcohol-related pancreatitis who is seen for follow-up.   1.  Decompensated alcohol induced cirrhosis --[name] continues to do very well clinically compared to earlier this year.  He is come quite a long way but remains chronically ill with his liver disease.  At present his hepatic encephalopathy seems well controlled, his volume overload is under control and he has remained alcohol abstinent.  I recommended the following: --Upper endoscopy for variceal screening; we discussed potential banding; we discussed the risk, benefits and alternatives and he is agreeable and wishes to proceed.  This will be performed in the outpatient hospital setting with monitored anesthesia care.  Of note he did have perisplenic varices by cross-sectional imaging last month. --Continue torsemide 20 mg and spironolactone 50 mg daily; primary care is dose titrating his diuretics and he is on electrolyte replacement --CBC, CMP and INR today --He will continue folic acid supplementation --No evidence for encephalopathy continue rifaximin 550 mg twice daily and lactulose twice daily titrated for 2-3 soft formed stools daily --Continue daily PPI --He needs to continue to seek outpatient alcohol treatment program and follow-up with [facility] liver clinic as directed --[facility] screening up-to-date with no  evidence of [place] by CT scan 02/09/2019; consider ultrasound around November 2020  2.  Rectal bleeding --we will evaluate with colonoscopy on the same day as his upper endoscopy.  We discussed the risk, benefits and alternatives and he is agreeable and wishes to proceed  65-month office visit for follow-up, sooner if needed  25 minutes spent with the patient today. Greater than 50% was spent in counseling and coordination of care with the patient

## 2019-03-13 NOTE — H&P (View-Only) (Signed)
Subjective:    Patient ID: [name], male    DOB: 1981/04/10, 38 y.o.   MRN: [id number]  HPI [name] is a 38 year old male with a history of decompensated alcoholic cirrhosis, successfully treated viral hepatitis C, history of alcohol-related pancreatitis who is seen for follow-up.  He is seen in person today and was last seen on 02/21/2019 by virtual visit.  He is here alone today.  He reports that recently he is doing and feeling well.  He has continued his torsemide and spironolactone and reports his lower extremity swelling has improved.  He also continues lactulose twice a day and is having at least 2 stools per day.  He takes rifaximin 550 mg twice daily.  No confusion, sleepiness.  He does not feel forgetful.  He is sleeping better at night and thus less sleepy during the day.  He is taking pantoprazole 40 mg a day.  He is also continuing his folic acid, magnesium supplementation and K-Dur.  He is seeing red blood with wiping and occasionally on the stool on most days.  This is painless in nature.  Good appetite.  No nausea or vomiting.  No heartburn.  No trouble swallowing.  No melena.  He has been completely alcohol abstinent.  He is living with his girlfriend.  He has close contact with his brother.   Review of Systems As per HPI, otherwise negative  Current Medications, Allergies, Past Medical History, Past Surgical History, Family History and Social History were reviewed in [employer] record.     Objective:   Physical Exam BP 102/60   Pulse 92   Ht 5\' 11"  (1.803 m)   Wt 263 lb (119.3 kg)   BMI 36.68 kg/m  Constitutional: Well-developed and well-nourished. No distress. HEENT: Normocephalic and atraumatic.  Conjunctivae are normal.  Sclera are icteric. Neck: Neck supple. Trachea midline. Cardiovascular: Normal rate, regular rhythm and intact distal pulses. No M/R/G Pulmonary/chest: Effort normal and breath sounds normal. No wheezing,  rales or rhonchi. Abdominal: Soft, nontender, nondistended. Bowel sounds active throughout. There are no masses palpable. No hepatosplenomegaly. Extremities: no clubbing, cyanosis, trace pretibial edema at the ankle Neurological: Alert and oriented to person place and time.  No asterixis Skin: Skin is warm and dry. Psychiatric: Normal mood and affect. Behavior is normal.  Labs ordered today, CBC, CMP and INR  CT ABDOMEN AND PELVIS WITH CONTRAST   TECHNIQUE: Multidetector CT imaging of the abdomen and pelvis was performed using the standard protocol following bolus administration of intravenous contrast.   CONTRAST:  185mL OMNIPAQUE IOHEXOL 300 MG/ML  SOLN   COMPARISON:  CT abdomen pelvis 11/10/2017   FINDINGS: LOWER CHEST: Small right pleural effusion   HEPATOBILIARY: Diffusely nodular hepatic contours with relative hypertrophy of the caudate and left hepatic lobe, consistent with hepatic cirrhosis. No focal liver lesion. No biliary dilatation. Mild gallbladder wall thickening and perihepatic ascites.   PANCREAS: The pancreatic parenchymal contours are normal and there is no ductal dilatation. There is no peripancreatic fluid collection.   SPLEEN: Splenomegaly, measuring 15.3 cm in craniocaudal dimension.   ADRENALS/URINARY TRACT:   --Adrenal glands: Normal.   --Right kidney/ureter: No hydronephrosis, nephroureterolithiasis, perinephric stranding or solid renal mass.   --Left kidney/ureter: No hydronephrosis, nephroureterolithiasis, perinephric stranding or solid renal mass.   --Urinary bladder: Normal for degree of distention   STOMACH/BOWEL:   --Stomach/Duodenum: There is no hiatal hernia or other gastric abnormality. The duodenal course and caliber are normal.   --Small bowel:  No dilatation or inflammation.   --Colon: No focal abnormality.   --Appendix: Normal.   VASCULAR/LYMPHATIC: Normal course and caliber of the major abdominal vessels. Perisplenic  varices. No abdominal or pelvic lymphadenopathy.   REPRODUCTIVE: Normal prostate size with symmetric seminal vesicles.   MUSCULOSKELETAL. No bony spinal canal stenosis or focal osseous abnormality.   OTHER: Anasarca.   IMPRESSION: 1. Hepatic cirrhosis with small volume ascites. 2. Splenomegaly and small caliber perisplenic varices, consistent with portal hypertension. 3. Small right pleural effusion.     Electronically Signed   By: Ulyses Jarred M.D.   On: 02/09/2019 02:40       Assessment & Plan:   38 year old male with a history of decompensated alcoholic cirrhosis, successfully treated viral hepatitis C, history of alcohol-related pancreatitis who is seen for follow-up.   1.  Decompensated alcohol induced cirrhosis --[name] continues to do very well clinically compared to earlier this year.  He is come quite a long way but remains chronically ill with his liver disease.  At present his hepatic encephalopathy seems well controlled, his volume overload is under control and he has remained alcohol abstinent.  I recommended the following: --Upper endoscopy for variceal screening; we discussed potential banding; we discussed the risk, benefits and alternatives and he is agreeable and wishes to proceed.  This will be performed in the outpatient hospital setting with monitored anesthesia care.  Of note he did have perisplenic varices by cross-sectional imaging last month. --Continue torsemide 20 mg and spironolactone 50 mg daily; primary care is dose titrating his diuretics and he is on electrolyte replacement --CBC, CMP and INR today --He will continue folic acid supplementation --No evidence for encephalopathy continue rifaximin 550 mg twice daily and lactulose twice daily titrated for 2-3 soft formed stools daily --Continue daily PPI --He needs to continue to seek outpatient alcohol treatment program and follow-up with [facility] liver clinic as directed --[facility] screening up-to-date with no  evidence of [place] by CT scan 02/09/2019; consider ultrasound around November 2020  2.  Rectal bleeding --we will evaluate with colonoscopy on the same day as his upper endoscopy.  We discussed the risk, benefits and alternatives and he is agreeable and wishes to proceed  17-month office visit for follow-up, sooner if needed  25 minutes spent with the patient today. Greater than 50% was spent in counseling and coordination of care with the patient

## 2019-03-14 ENCOUNTER — Other Ambulatory Visit: Payer: Self-pay

## 2019-03-14 DIAGNOSIS — K7031 Alcoholic cirrhosis of liver with ascites: Secondary | ICD-10-CM

## 2019-03-14 LAB — COMPREHENSIVE METABOLIC PANEL
ALT: 42 U/L (ref 0–53)
AST: 98 U/L — ABNORMAL HIGH (ref 0–37)
Albumin: 2.5 g/dL — ABNORMAL LOW (ref 3.5–5.2)
Alkaline Phosphatase: 190 U/L — ABNORMAL HIGH (ref 39–117)
BUN: 15 mg/dL (ref 6–23)
CO2: 25 mEq/L (ref 19–32)
Calcium: 8.5 mg/dL (ref 8.4–10.5)
Chloride: 99 mEq/L (ref 96–112)
Creatinine, Ser: 0.9 mg/dL (ref 0.40–1.50)
GFR: 94.32 mL/min (ref >60.00)
Glucose, Bld: 86 mg/dL (ref 70–99)
Potassium: 3.5 mEq/L (ref 3.5–5.1)
Sodium: 132 mEq/L — ABNORMAL LOW (ref 135–145)
Total Bilirubin: 10.5 mg/dL — ABNORMAL HIGH (ref 0.2–1.2)
Total Protein: 7.2 g/dL (ref 6.0–8.3)

## 2019-03-15 ENCOUNTER — Other Ambulatory Visit: Payer: Self-pay | Admitting: Family Medicine

## 2019-03-16 ENCOUNTER — Other Ambulatory Visit (HOSPITAL_COMMUNITY)
Admission: RE | Admit: 2019-03-16 | Discharge: 2019-03-16 | Disposition: A | Discharge status: Home / Self Care | Payer: HRSA Program | Source: Ambulatory Visit | Attending: Internal Medicine | Admitting: Internal Medicine

## 2019-03-16 DIAGNOSIS — Z1159 Encounter for screening for other viral diseases: Secondary | ICD-10-CM | POA: Insufficient documentation

## 2019-03-16 LAB — SARS CORONAVIRUS 2 (TAT 6-24 HRS): SARS Coronavirus 2: NEGATIVE

## 2019-03-17 NOTE — Progress Notes (Signed)
sw patient, informed covid 19 test negative.  Confirmed appointment for Monday, March 20, 2019 with arrival at 9:30.  Answered questions regarding procedure.  Pt verbalize understanding of self quarantining.

## 2019-03-17 NOTE — Progress Notes (Signed)
Attempt to contact patient to complete endo pre-op interview. No answer

## 2019-03-17 NOTE — Progress Notes (Signed)
Attempted to contact patient regarding Monday March 20, 2019 procedure. Left voicemessage

## 2019-03-20 ENCOUNTER — Ambulatory Visit (HOSPITAL_COMMUNITY): Payer: No Typology Code available for payment source | Admitting: Registered Nurse

## 2019-03-20 ENCOUNTER — Ambulatory Visit (HOSPITAL_COMMUNITY)
Admission: RE | Admit: 2019-03-20 | Discharge: 2019-03-20 | Disposition: A | Discharge status: Home / Self Care | Payer: No Typology Code available for payment source | Attending: Internal Medicine | Admitting: Internal Medicine

## 2019-03-20 ENCOUNTER — Encounter (HOSPITAL_COMMUNITY): Payer: Self-pay | Admitting: *Deleted

## 2019-03-20 ENCOUNTER — Encounter (HOSPITAL_COMMUNITY)
Admission: RE | Disposition: A | Discharge status: Home / Self Care | Payer: Self-pay | Source: Home / Self Care | Attending: Internal Medicine

## 2019-03-20 ENCOUNTER — Other Ambulatory Visit: Payer: Self-pay

## 2019-03-20 DIAGNOSIS — K7031 Alcoholic cirrhosis of liver with ascites: Secondary | ICD-10-CM

## 2019-03-20 DIAGNOSIS — K7290 Hepatic failure, unspecified without coma: Secondary | ICD-10-CM

## 2019-03-20 DIAGNOSIS — K766 Portal hypertension: Secondary | ICD-10-CM

## 2019-03-20 DIAGNOSIS — Z8619 Personal history of other infectious and parasitic diseases: Secondary | ICD-10-CM | POA: Insufficient documentation

## 2019-03-20 DIAGNOSIS — Z79899 Other long term (current) drug therapy: Secondary | ICD-10-CM | POA: Diagnosis not present

## 2019-03-20 DIAGNOSIS — K5790 Diverticulosis of intestine, part unspecified, without perforation or abscess without bleeding: Secondary | ICD-10-CM | POA: Diagnosis not present

## 2019-03-20 DIAGNOSIS — K625 Hemorrhage of anus and rectum: Secondary | ICD-10-CM | POA: Diagnosis present

## 2019-03-20 DIAGNOSIS — F1011 Alcohol abuse, in remission: Secondary | ICD-10-CM | POA: Diagnosis not present

## 2019-03-20 DIAGNOSIS — K7030 Alcoholic cirrhosis of liver without ascites: Secondary | ICD-10-CM | POA: Insufficient documentation

## 2019-03-20 DIAGNOSIS — K7682 Hepatic encephalopathy: Secondary | ICD-10-CM

## 2019-03-20 DIAGNOSIS — R600 Localized edema: Secondary | ICD-10-CM

## 2019-03-20 DIAGNOSIS — K648 Other hemorrhoids: Secondary | ICD-10-CM | POA: Insufficient documentation

## 2019-03-20 DIAGNOSIS — K3189 Other diseases of stomach and duodenum: Secondary | ICD-10-CM | POA: Insufficient documentation

## 2019-03-20 HISTORY — PX: COLONOSCOPY WITH PROPOFOL: SHX5780

## 2019-03-20 HISTORY — PX: ESOPHAGOGASTRODUODENOSCOPY (EGD) WITH PROPOFOL: SHX5813

## 2019-03-20 SURGERY — ESOPHAGOGASTRODUODENOSCOPY (EGD) WITH PROPOFOL
Anesthesia: Monitor Anesthesia Care

## 2019-03-20 MED ORDER — SODIUM CHLORIDE 0.9 % IV SOLN: INTRAVENOUS | Status: DC

## 2019-03-20 MED ORDER — PROPOFOL 500 MG/50ML IV EMUL
INTRAVENOUS | Status: DC | PRN
  Administered 2019-03-20: 130 ug/kg/min via INTRAVENOUS

## 2019-03-20 MED ORDER — PROPOFOL 10 MG/ML IV BOLUS
INTRAVENOUS | Status: AC
  Filled 2019-03-20: qty 20

## 2019-03-20 MED ORDER — PROPOFOL 10 MG/ML IV BOLUS
INTRAVENOUS | Status: DC | PRN
  Administered 2019-03-20 (×2): 40 mg via INTRAVENOUS
  Administered 2019-03-20 (×4): 20 mg via INTRAVENOUS
  Administered 2019-03-20: 40 mg via INTRAVENOUS

## 2019-03-20 MED ORDER — LACTATED RINGERS IV SOLN
INTRAVENOUS | Status: AC | PRN
  Administered 2019-03-20: 1000 mL via INTRAVENOUS

## 2019-03-20 SURGICAL SUPPLY — 25 items

## 2019-03-20 NOTE — Transfer of Care (Signed)
Immediate Anesthesia Transfer of Care Note  Patient: Tevan Marian Harbeson  Procedure(s) Performed: ESOPHAGOGASTRODUODENOSCOPY (EGD) WITH PROPOFOL (N/A ) COLONOSCOPY WITH PROPOFOL (N/A )  Patient Location: Endoscopy Unit  Anesthesia Type:MAC  Level of Consciousness: awake, alert , oriented and patient cooperative  Airway & Oxygen Therapy: Patient Spontanous Breathing and Patient connected to face mask oxygen  Post-op Assessment: Report given to RN, Post -op Vital signs reviewed and stable and Patient moving all extremities  Post vital signs: Reviewed and stable  Last Vitals:  Vitals Value Taken Time  BP 108/76 03/20/19 1117  Temp    Pulse 78 03/20/19 1120  Resp 16 03/20/19 1120  SpO2 100 % 03/20/19 1120  Vitals shown include unvalidated device data.  Last Pain:  Vitals:   03/20/19 0955  TempSrc: Oral  PainSc: 0-No pain         Complications: No apparent anesthesia complications

## 2019-03-20 NOTE — Op Note (Signed)
[facility] Patient Name: [name] Procedure Date: 03/20/2019 MRN: [id number] Attending MD: Jerene Bears , MD Date of Birth: [birth year] CSN: [id number] Age: 38 Admit Type: Outpatient Procedure:                Colonoscopy Indications:              Rectal bleeding Providers:                Lajuan Lines. Hilarie Fredrickson, MD, Cleda Daub, RN, Ladona Ridgel, Technician Referring MD:              Medicines:                Monitored Anesthesia Care Complications:            No immediate complications. Estimated Blood Loss:     Estimated blood loss: none. Procedure:                Pre-Anesthesia Assessment:                           - Prior to the procedure, a History and Physical                            was performed, and patient medications and                            allergies were reviewed. The patient's tolerance of                            previous anesthesia was also reviewed. The risks                            and benefits of the procedure and the sedation                            options and risks were discussed with the patient.                            All questions were answered, and informed consent                            was obtained. Prior Anticoagulants: The patient has                            taken no previous anticoagulant or antiplatelet                            agents. ASA Grade Assessment: III - A patient with                            severe systemic disease. After reviewing the risks                            and  benefits, the patient was deemed in                            satisfactory condition to undergo the procedure.                           - Prior to the procedure, a History and Physical                            was performed, and patient medications and                            allergies were reviewed. The patient's tolerance of                            previous anesthesia was also reviewed.  The risks                            and benefits of the procedure and the sedation                            options and risks were discussed with the patient.                            All questions were answered, and informed consent                            was obtained. Prior Anticoagulants: The patient has                            taken no previous anticoagulant or antiplatelet                            agents. ASA Grade Assessment: III - A patient with                            severe systemic disease. After reviewing the risks                            and benefits, the patient was deemed in                            satisfactory condition to undergo the procedure.                           After obtaining informed consent, the colonoscope                            was passed under direct vision. Throughout the                            procedure, the patient's blood pressure, pulse, and  oxygen saturations were monitored continuously. The                            CF-HQ190L ([id number]) Olympus colonoscope was                            introduced through the anus and advanced to the                            cecum, identified by appendiceal orifice and                            ileocecal valve. The colonoscopy was performed                            without difficulty. The patient tolerated the                            procedure well. The quality of the bowel                            preparation was excellent. The ileocecal valve,                            appendiceal orifice, and rectum were photographed. Scope In: 10:57:31 AM Scope Out: 11:11:39 AM Scope Withdrawal Time: 0 hours 11 minutes 30 seconds  Total Procedure Duration: 0 hours 14 minutes 8 seconds  Findings:      The digital rectal exam was normal.      Multiple small-mouthed diverticula were found in the sigmoid colon and       descending colon.      Internal hemorrhoids  were found during retroflexion. The hemorrhoids       were small. No rectal varices.      The exam was otherwise without abnormality. Impression:               - Diverticulosis in the sigmoid colon and in the                            descending colon.                           - Small internal hemorrhoids (likely source of                            intermittent red blood per rectum with bowel                            movements).                           - The examination was otherwise normal.                           - No specimens collected. Moderate Sedation:      N/A Recommendation:           -  Patient has a contact number available for                            emergencies. The signs and symptoms of potential                            delayed complications were discussed with the                            patient. Return to normal activities tomorrow.                            Written discharge instructions were provided to the                            patient.                           - Resume previous diet.                           - Continue present medications.                           - Repeat colonoscopy at age 81 for screening                            purposes. Procedure Code(s):        --- Professional ---                           [phone], Colonoscopy, flexible; diagnostic, including                            collection of specimen(s) by brushing or washing,                            when performed (separate procedure) Diagnosis Code(s):        --- Professional ---                           K64.8, Other hemorrhoids                           K62.5, Hemorrhage of anus and rectum                           K57.30, Diverticulosis of large intestine without                            perforation or abscess without bleeding CPT copyright 2019 [employer]. All rights reserved. The codes documented in this report are preliminary and upon coder  review may  be revised to meet current compliance requirements. Jerene Bears, MD 03/20/2019 11:23:08 AM This report has been signed electronically. Number of Addenda: 0

## 2019-03-20 NOTE — Anesthesia Postprocedure Evaluation (Signed)
Anesthesia Post Note  Patient: [name]  Procedure(s) Performed: ESOPHAGOGASTRODUODENOSCOPY (EGD) WITH PROPOFOL (N/A ) COLONOSCOPY WITH PROPOFOL (N/A )     Patient location during evaluation: PACU Anesthesia Type: MAC Level of consciousness: awake and alert Pain management: pain level controlled Vital Signs Assessment: post-procedure vital signs reviewed and stable Respiratory status: spontaneous breathing, nonlabored ventilation, respiratory function stable and patient connected to nasal cannula oxygen Cardiovascular status: stable and blood pressure returned to baseline Postop Assessment: no apparent nausea or vomiting Anesthetic complications: no    Last Vitals:  Vitals:   03/20/19 1135 03/20/19 1140  BP:  129/69  Pulse: 86 82  Resp: 16 17  Temp:    SpO2: 100% 100%    Last Pain:  Vitals:   03/20/19 1140  TempSrc:   PainSc: 0-No pain                 Elizandro Laura

## 2019-03-20 NOTE — Anesthesia Preprocedure Evaluation (Addendum)
Anesthesia Evaluation  Patient identified by MRN, date of birth, ID band Patient awake    Reviewed: Allergy & Precautions, NPO status , Patient's Chart, lab work & pertinent test results  Airway Mallampati: I  TM Distance: >3 FB     Dental no notable dental hx. (+) Poor Dentition, Missing   Pulmonary Current Smoker, former smoker,    breath sounds clear to auscultation       Cardiovascular  Rhythm:Regular Rate:Normal     Neuro/Psych    GI/Hepatic (+)     substance abuse  alcohol use, Hepatitis -, C  Endo/Other    Renal/GU      Musculoskeletal   Abdominal   Peds  Hematology   Anesthesia Other Findings   Reproductive/Obstetrics                            Anesthesia Physical  Anesthesia Plan  ASA: III  Anesthesia Plan: MAC   Post-op Pain Management:    Induction: Intravenous  PONV Risk Score and Plan: 1 and Treatment may vary due to age or medical condition  Airway Management Planned: Nasal Cannula  Additional Equipment:   Intra-op Plan:   Post-operative Plan: Extubation in OR  Informed Consent: I have reviewed the patients History and Physical, chart, labs and discussed the procedure including the risks, benefits and alternatives for the proposed anesthesia with the patient or authorized representative who has indicated his/her understanding and acceptance.     Dental advisory given  Plan Discussed with: CRNA, Anesthesiologist and Surgeon  Anesthesia Plan Comments:         Anesthesia Quick Evaluation

## 2019-03-20 NOTE — Op Note (Signed)
[facility] Patient Name: [name] Procedure Date: 03/20/2019 MRN: [id number] Attending MD: Jerene Bears , MD Date of Birth: [birth year] CSN: [id number] Age: 38 Admit Type: Outpatient Procedure:                Upper GI endoscopy Indications:              Cirrhosis rule out esophageal varices Providers:                Lajuan Lines. Hilarie Fredrickson, MD, Cleda Daub, RN, Ladona Ridgel, Technician Referring MD:              Medicines:                Monitored Anesthesia Care Complications:            No immediate complications. Estimated Blood Loss:     Estimated blood loss: none. Procedure:                Pre-Anesthesia Assessment:                           - Prior to the procedure, a History and Physical                            was performed, and patient medications and                            allergies were reviewed. The patient's tolerance of                            previous anesthesia was also reviewed. The risks                            and benefits of the procedure and the sedation                            options and risks were discussed with the patient.                            All questions were answered, and informed consent                            was obtained. Prior Anticoagulants: The patient has                            taken no previous anticoagulant or antiplatelet                            agents. ASA Grade Assessment: III - A patient with                            severe systemic disease. After reviewing the risks  and benefits, the patient was deemed in                            satisfactory condition to undergo the procedure.                           After obtaining informed consent, the endoscope was                            passed under direct vision. Throughout the                            procedure, the patient's blood pressure, pulse, and                            oxygen  saturations were monitored continuously. The                            [id number]-H190 ([id number]) Olympus gastroscope was                            introduced through the mouth, and advanced to the                            second part of duodenum. The upper GI endoscopy was                            accomplished without difficulty. The patient                            tolerated the procedure well. Scope In: Scope Out: Findings:      The examined esophagus was normal.      There is no endoscopic evidence of varices in the entire esophagus.      Mild portal hypertensive gastropathy was found in the cardia, in the       gastric fundus and in the gastric body.      The exam of the stomach was otherwise normal.      The examined duodenum was normal. Impression:               - Normal esophagus.                           - Portal hypertensive gastropathy.                           - Normal examined duodenum.                           - No evidence of esophageal or gastric varices.                           - No specimens collected. Moderate Sedation:      N/A Recommendation:           - Patient has a contact number available for  emergencies. The signs and symptoms of potential                            delayed complications were discussed with the                            patient. Return to normal activities tomorrow.                            Written discharge instructions were provided to the                            patient.                           - Resume previous diet.                           - Continue present medications.                           - Repeat upper endoscopy in 2 years for screening                            purposes. Procedure Code(s):        --- Professional ---                           [phone], Esophagogastroduodenoscopy, flexible,                            transoral; diagnostic, including collection of                             specimen(s) by brushing or washing, when performed                            (separate procedure) Diagnosis Code(s):        --- Professional ---                           K76.6, Portal hypertension                           K31.89, Other diseases of stomach and duodenum                           K74.60, Unspecified cirrhosis of liver CPT copyright 2019 [employer]. All rights reserved. The codes documented in this report are preliminary and upon coder review may  be revised to meet current compliance requirements. Jerene Bears, MD 03/20/2019 10:55:40 AM This report has been signed electronically. Number of Addenda: 0

## 2019-03-20 NOTE — Interval H&P Note (Signed)
History and Physical Interval Note: For EGD and colonoscopy as discussed recently in clinic visit. The nature of the procedure, as well as the risks, benefits, and alternatives were carefully and thoroughly reviewed with the patient. Ample time for discussion and questions allowed. The patient understood, was satisfied, and agreed to proceed.   Lab Results  Component Value Date   WBC 9.4 03/13/2019   HGB 11.2 (L) 03/13/2019   HCT 32.7 (L) 03/13/2019   MCV 100.1 (H) 03/13/2019   PLT 70.0 (L) 03/13/2019   Lab Results  Component Value Date   INR 2.0 (H) 03/13/2019   INR 3.3 (H) 02/09/2019   INR 2.8 (H) 02/09/2019      03/20/2019 10:29 AM  [name]  has presented today for surgery, with the diagnosis of alcoholic hepatitis, variceal screening, rectal bleeding.  The various methods of treatment have been discussed with the patient and family. After consideration of risks, benefits and other options for treatment, the patient has consented to  Procedure(s): ESOPHAGOGASTRODUODENOSCOPY (EGD) WITH PROPOFOL (N/A) COLONOSCOPY WITH PROPOFOL (N/A) as a surgical intervention.  The patient's history has been reviewed, patient examined, no change in status, stable for surgery.  I have reviewed the patient's chart and labs.  Questions were answered to the patient's satisfaction.     Lajuan Lines Shaynna Husby

## 2019-03-20 NOTE — Discharge Instructions (Signed)

## 2019-03-21 ENCOUNTER — Encounter (HOSPITAL_COMMUNITY): Payer: Self-pay | Admitting: Internal Medicine

## 2019-04-20 ENCOUNTER — Telehealth: Payer: Self-pay | Admitting: Internal Medicine

## 2019-04-20 NOTE — Telephone Encounter (Signed)
9:35am:   TC to contacts available, and voice messages left with -(brother) [name] [phone], and -(girlfriend) [facility]  To offer f/u PC visit. I left my contact information.  Violeta Gelinas NP-C [phone]

## 2019-04-20 NOTE — Telephone Encounter (Signed)
Repeat note in error

## 2019-04-28 ENCOUNTER — Telehealth (HOSPITAL_COMMUNITY): Payer: Self-pay | Admitting: Psychiatry

## 2019-05-02 ENCOUNTER — Other Ambulatory Visit: Payer: Self-pay

## 2019-05-02 ENCOUNTER — Ambulatory Visit (HOSPITAL_COMMUNITY): Payer: Medicaid Other | Admitting: Licensed Clinical Social Worker

## 2019-05-02 NOTE — Progress Notes (Incomplete)
16 duaghter fromwhen in HS (no contact 'her mom ran off and got married and took her away) Niece/nephew found her and contact, wants to visit "both nervous and extatic" Hope-g/f living at home. 'several years' Good support? "she still drinks quite a bit"  Client born and raised in [place], [place], 'I [place]'. [place]. 2 older brothers, 1 passed Dad lt w/ sherrif. 'hunting and stuff together' outside often. Close with brother; close with father;  Mom 'super close' secretary most of life (brought client to appt due to problem with client eyes)  Family prevoiusly worrked about tx 'they like to spend time with me to keep an eye on me'  2016-ED Sober-ish 2 years 2018 cirhosis dx Last summer "nothing in particular" "nothing dramatic happened" AA "its been mentioned"

## 2019-05-03 ENCOUNTER — Ambulatory Visit (HOSPITAL_COMMUNITY): Payer: Self-pay | Admitting: Licensed Clinical Social Worker

## 2019-05-09 ENCOUNTER — Ambulatory Visit (HOSPITAL_COMMUNITY): Payer: Self-pay | Admitting: Licensed Clinical Social Worker

## 2019-05-15 ENCOUNTER — Other Ambulatory Visit (INDEPENDENT_AMBULATORY_CARE_PROVIDER_SITE_OTHER): Payer: Medicaid Other

## 2019-05-15 DIAGNOSIS — K7031 Alcoholic cirrhosis of liver with ascites: Secondary | ICD-10-CM

## 2019-05-15 LAB — PROTIME-INR
INR: 1.7 ratio — ABNORMAL HIGH (ref 0.8–1.0)
Prothrombin Time: 19.4 s — ABNORMAL HIGH (ref 9.6–13.1)

## 2019-05-15 LAB — COMPREHENSIVE METABOLIC PANEL
ALT: 42 U/L (ref 0–53)
AST: 72 U/L — ABNORMAL HIGH (ref 0–37)
Albumin: 3.4 g/dL — ABNORMAL LOW (ref 3.5–5.2)
Alkaline Phosphatase: 160 U/L — ABNORMAL HIGH (ref 39–117)
BUN: 8 mg/dL (ref 6–23)
CO2: 24 mEq/L (ref 19–32)
Calcium: 9 mg/dL (ref 8.4–10.5)
Chloride: 108 mEq/L (ref 96–112)
Creatinine, Ser: 0.54 mg/dL (ref 0.40–1.50)
GFR: 169.92 mL/min (ref >60.00)
Glucose, Bld: 122 mg/dL — ABNORMAL HIGH (ref 70–99)
Potassium: 4.3 mEq/L (ref 3.5–5.1)
Sodium: 138 mEq/L (ref 135–145)
Total Bilirubin: 3.3 mg/dL — ABNORMAL HIGH (ref 0.2–1.2)
Total Protein: 7.8 g/dL (ref 6.0–8.3)

## 2019-05-15 LAB — CBC
HCT: 37.2 % — ABNORMAL LOW (ref 39.0–52.0)
Hemoglobin: 12.6 g/dL — ABNORMAL LOW (ref 13.0–17.0)
MCHC: 33.9 g/dL (ref 30.0–36.0)
MCV: 100.8 fl — ABNORMAL HIGH (ref 78.0–100.0)
Platelets: 60 10*3/uL — ABNORMAL LOW (ref 150.0–400.0)
RBC: 3.69 Mil/uL — ABNORMAL LOW (ref 4.22–5.81)
RDW: 15.7 % — ABNORMAL HIGH (ref 11.5–15.5)
WBC: 4.3 10*3/uL (ref 4.0–10.5)

## 2019-06-06 ENCOUNTER — Ambulatory Visit: Payer: No Typology Code available for payment source | Admitting: Internal Medicine

## 2019-06-08 ENCOUNTER — Other Ambulatory Visit: Payer: Self-pay | Admitting: Internal Medicine

## 2019-06-24 ENCOUNTER — Other Ambulatory Visit: Payer: Self-pay | Admitting: Internal Medicine

## 2019-06-30 ENCOUNTER — Other Ambulatory Visit: Payer: Self-pay | Admitting: Internal Medicine

## 2019-07-25 ENCOUNTER — Telehealth: Payer: Self-pay | Admitting: *Deleted

## 2019-07-25 ENCOUNTER — Ambulatory Visit: Payer: No Typology Code available for payment source | Admitting: Internal Medicine

## 2019-07-25 NOTE — Telephone Encounter (Signed)
Patient did not come for office visit on 07/25/19. His mother cancelled his appointment and said "insurance would not cover." Patient desperately needs follow up care for his end stage liver disease and is also due for liver ultrasound. Dr Hilarie Fredrickson has asked that we follow up with patient to advise him of this. I have sent a letter to patient's home mailing address with this information.

## 2019-07-31 ENCOUNTER — Telehealth: Payer: Self-pay | Admitting: Internal Medicine

## 2019-07-31 NOTE — Telephone Encounter (Signed)
Spoke to patient who indicates that he has lost his health insurance as they cancelled it due to "pre-existing condition." He is also not working right now as he works in [place] and they have been closed due to [employer]. In addition, he has applied for disability but has been denied once and has now reapplied since new illnesses have arisen.   I have advised patient that he should apply for [facility] to see if they can help him as we really need to keep up with his office visits etc and keep him as healthy as possible. Patient verbalizes understanding. I have sent form to his home address and he states he will fill this out and get it to [facility].

## 2019-08-19 ENCOUNTER — Emergency Department (HOSPITAL_COMMUNITY)
Admission: EM | Admit: 2019-08-19 | Discharge: 2019-08-19 | Disposition: A | Discharge status: Home / Self Care | Payer: HRSA Program | Attending: Emergency Medicine | Admitting: Emergency Medicine

## 2019-08-19 ENCOUNTER — Emergency Department (HOSPITAL_COMMUNITY): Payer: HRSA Program

## 2019-08-19 ENCOUNTER — Encounter (HOSPITAL_COMMUNITY): Payer: Self-pay | Admitting: Emergency Medicine

## 2019-08-19 ENCOUNTER — Other Ambulatory Visit: Payer: Self-pay

## 2019-08-19 DIAGNOSIS — R05 Cough: Secondary | ICD-10-CM | POA: Diagnosis present

## 2019-08-19 DIAGNOSIS — Z20828 Contact with and (suspected) exposure to other viral communicable diseases: Secondary | ICD-10-CM

## 2019-08-19 DIAGNOSIS — U071 COVID-19: Secondary | ICD-10-CM | POA: Insufficient documentation

## 2019-08-19 DIAGNOSIS — E876 Hypokalemia: Secondary | ICD-10-CM | POA: Insufficient documentation

## 2019-08-19 DIAGNOSIS — F17210 Nicotine dependence, cigarettes, uncomplicated: Secondary | ICD-10-CM | POA: Diagnosis not present

## 2019-08-19 DIAGNOSIS — R197 Diarrhea, unspecified: Secondary | ICD-10-CM

## 2019-08-19 DIAGNOSIS — D696 Thrombocytopenia, unspecified: Secondary | ICD-10-CM | POA: Diagnosis not present

## 2019-08-19 DIAGNOSIS — Z20822 Contact with and (suspected) exposure to covid-19: Secondary | ICD-10-CM

## 2019-08-19 DIAGNOSIS — Z79899 Other long term (current) drug therapy: Secondary | ICD-10-CM | POA: Insufficient documentation

## 2019-08-19 DIAGNOSIS — J069 Acute upper respiratory infection, unspecified: Secondary | ICD-10-CM

## 2019-08-19 DIAGNOSIS — R112 Nausea with vomiting, unspecified: Secondary | ICD-10-CM

## 2019-08-19 LAB — BASIC METABOLIC PANEL
Anion gap: 9 (ref 5–15)
BUN: 8 mg/dL (ref 6–20)
CO2: 29 mmol/L (ref 22–32)
Calcium: 8.9 mg/dL (ref 8.9–10.3)
Chloride: 103 mmol/L (ref 98–111)
Creatinine, Ser: 0.89 mg/dL (ref 0.61–1.24)
GFR calc Af Amer: >60 mL/min (ref >60)
GFR calc non Af Amer: >60 mL/min (ref >60)
Glucose, Bld: 141 mg/dL — ABNORMAL HIGH (ref 70–99)
Potassium: 2.9 mmol/L — ABNORMAL LOW (ref 3.5–5.1)
Sodium: 141 mmol/L (ref 135–145)

## 2019-08-19 LAB — CBC WITH DIFFERENTIAL/PLATELET
Abs Immature Granulocytes: 0.01 10*3/uL (ref 0.00–0.07)
Basophils Absolute: 0 10*3/uL (ref 0.0–0.1)
Basophils Relative: 1 %
Eosinophils Absolute: 0 10*3/uL (ref 0.0–0.5)
Eosinophils Relative: 0 %
HCT: 40.8 % (ref 39.0–52.0)
Hemoglobin: 13.5 g/dL (ref 13.0–17.0)
Immature Granulocytes: 0 %
Lymphocytes Relative: 26 %
Lymphs Abs: 1 10*3/uL (ref 0.7–4.0)
MCH: 34.4 pg — ABNORMAL HIGH (ref 26.0–34.0)
MCHC: 33.1 g/dL (ref 30.0–36.0)
MCV: 104.1 fL — ABNORMAL HIGH (ref 80.0–100.0)
Monocytes Absolute: 0.6 10*3/uL (ref 0.1–1.0)
Monocytes Relative: 15 %
Neutro Abs: 2.2 10*3/uL (ref 1.7–7.7)
Neutrophils Relative %: 58 %
Platelets: 27 10*3/uL — CL (ref 150–400)
RBC: 3.92 MIL/uL — ABNORMAL LOW (ref 4.22–5.81)
RDW: 15 % (ref 11.5–15.5)
WBC: 3.7 10*3/uL — ABNORMAL LOW (ref 4.0–10.5)
nRBC: 0 % (ref 0.0–0.2)

## 2019-08-19 LAB — URINALYSIS, ROUTINE W REFLEX MICROSCOPIC
Bacteria, UA: NONE SEEN
Glucose, UA: NEGATIVE mg/dL
Ketones, ur: NEGATIVE mg/dL
Leukocytes,Ua: NEGATIVE
Nitrite: NEGATIVE
Protein, ur: 30 mg/dL — AB
Specific Gravity, Urine: 1.018 (ref 1.005–1.030)
pH: 8 (ref 5.0–8.0)

## 2019-08-19 LAB — RAPID URINE DRUG SCREEN, HOSP PERFORMED
Amphetamines: NOT DETECTED
Barbiturates: NOT DETECTED
Benzodiazepines: NOT DETECTED
Cocaine: NOT DETECTED
Opiates: POSITIVE — AB
Tetrahydrocannabinol: NOT DETECTED

## 2019-08-19 LAB — CK: Total CK: 139 U/L (ref 49–397)

## 2019-08-19 LAB — POC SARS CORONAVIRUS 2 AG -  ED: SARS Coronavirus 2 Ag: NEGATIVE

## 2019-08-19 LAB — SARS CORONAVIRUS 2 (TAT 6-24 HRS): SARS Coronavirus 2: POSITIVE — AB

## 2019-08-19 LAB — GROUP A STREP BY PCR: Group A Strep by PCR: NOT DETECTED

## 2019-08-19 LAB — ETHANOL: Alcohol, Ethyl (B): <10 mg/dL (ref <10)

## 2019-08-19 LAB — LIPASE, BLOOD: Lipase: 23 U/L (ref 11–51)

## 2019-08-19 MED ORDER — ONDANSETRON 4 MG PO TBDP: 4.0000 mg | ORAL_TABLET | Freq: Three times a day (TID) | ORAL | 0 refills | Status: DC | PRN

## 2019-08-19 MED ORDER — SODIUM CHLORIDE 0.9 % IV BOLUS
1000.0000 mL | Freq: Once | INTRAVENOUS | Status: AC
  Administered 2019-08-19: 1000 mL via INTRAVENOUS

## 2019-08-19 MED ORDER — MORPHINE SULFATE (PF) 4 MG/ML IV SOLN
4.0000 mg | Freq: Once | INTRAVENOUS | Status: AC
  Administered 2019-08-19: 4 mg via INTRAVENOUS
  Filled 2019-08-19: qty 1

## 2019-08-19 MED ORDER — ONDANSETRON HCL 4 MG/2ML IJ SOLN
4.0000 mg | Freq: Once | INTRAMUSCULAR | Status: AC
  Administered 2019-08-19: 4 mg via INTRAVENOUS
  Filled 2019-08-19: qty 2

## 2019-08-19 MED ORDER — POTASSIUM CHLORIDE 10 MEQ/100ML IV SOLN
10.0000 meq | INTRAVENOUS | Status: AC
  Administered 2019-08-19 (×2): 10 meq via INTRAVENOUS
  Filled 2019-08-19 (×2): qty 100

## 2019-08-19 MED ORDER — PROMETHAZINE HCL 25 MG/ML IJ SOLN
12.5000 mg | Freq: Once | INTRAMUSCULAR | Status: AC
  Administered 2019-08-19: 12.5 mg via INTRAVENOUS
  Filled 2019-08-19: qty 1

## 2019-08-19 NOTE — ED Provider Notes (Signed)
[facility] EMERGENCY DEPARTMENT Provider Note   CSN: [id number] Arrival date & time: 08/19/19  [id number]    History   Chief Complaint Chief Complaint  Patient presents with  . Cough  . Sore Throat  . Generalized Body Aches    HPI [name] is a 38 y.o. male with medical history significant for alcoholic cirrhosis, recent EGD with no esophageal varices, alcohol abuse, hepatitis C who presents for evaluation multiple complaints.  Patient states he has had productive cough, emesis, diarrhea, congestion, rhinorrhea, myalgias over the last 3 days.  Patient states he is able to tolerate small sips of liquids however has had no appetite for solid foods.  He did have contact with son with Covid 1 month ago however he symptoms recently began.  Patient states he occasionally drinks alcohol and admits to prior withdrawal seizures.  His last drink was yesterday evening however states he does not drink daily, usually 2x week.  He denies fever, chills, headache, neck pain, neck stiffness, chest pain, hemoptysis, shortness of breath, dysuria.  His diarrhea is without melena or hematochezia.  Patient states he does have some generalized upper abdominal cramping which began after emesis.  He has had no hematemesis.  He has not take anything for his symptoms.  Denies additional aggravating or alleviating factors. Admits to generalized sore throat without drooling, dysphagia, trismus.  History obtained from patient, past medical records.  No interpreter was used.     HPI  Past Medical History:  Diagnosis Date  . Alcoholic cirrhosis ([place]) 99991111   confirmed by liver biopsy.    . Cellulitis 10/2016   right leg  . 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 ETOH abuse   . Hepatitis C 2015   Hepatitis C. Rx Solvaldi/Ribaviran in 2015, undetectable Hep C 03/2015 (see Care Everywhere) but did not have sustained response. Began 12 weeks Mayvret 10/2017 by [facility] in  [place], [place] PA-C  . Hepatitis, alcoholic Q000111Q   treated with Prednisolone.    . Pancreatitis, alcoholic, acute Q000111Q  . Paraspinal abscess ([place])   . Thrombocytopenia ([place]) 25/2018    Patient Active Problem List   Diagnosis Date Noted  . Portal hypertension ([place])   . Rectal bleeding   . SBP (spontaneous bacterial peritonitis) ([place])   . UTI (urinary tract infection) 02/09/2019  . Thrombocytosis ([place])   . E-coli UTI   . E coli bacteremia   . Urinary tract infection without hematuria   . Hepatic encephalopathy ([place]) 01/19/2019  . Weakness 01/18/2019  . AKI (acute kidney injury) ([place])   . Alcohol withdrawal ([place]) 01/12/2019  . Alcoholic hepatitis Q000111Q  . Hyperbilirubinemia   . Acute on chronic alcoholic liver disease ([place])   . History of portal hypertension 04/12/2018  . Medication monitoring encounter 01/19/2017  . Weakness of right lower extremity 11/19/2016  . 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 Abnormality of gait   . Post-operative pain   . Infective myositis of right thigh   . Coagulopathy ([place])   . Alcohol withdrawal syndrome with complication ([place])   . Transaminitis   . Infection 11/12/2016  . Alcoholic cirrhosis of liver without ascites ([place])   . Goals of care, counseling/discussion   . Palliative care by specialist   . Pyogenic arthritis of multiple sites ([place])   . Traumatic compartment syndrome of other sites   . 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 Traumatic compartment syndrome of lower extremity ([place])   . Infection of  lumbar spine ([place]) 11/01/2016  . Delirium tremens ([place])   . Abnormal transaminases   . Alcoholic hepatitis without ascites   . ETOH abuse   . Abscess of right lower extremity   . Cellulitis and abscess of right lower extremity   . Hypernatremia   . Cellulitis of right lower extremity 10/30/2016  . EtOH dependence ([place]) 10/30/2016  . Thrombocytopenia ([place]) 10/30/2016  . Cirrhosis ([place])   . Paraspinal abscess  ([place])   . Chronic hepatitis C without hepatic coma ([place])   . Hepatitis   . Epidural abscess 10/29/2016  . Liver failure ([place]) 10/29/2016  . Sepsis ([place]) 10/29/2016  . Hyponatremia 10/29/2016    Past Surgical History:  Procedure Laterality Date  . COLONOSCOPY WITH PROPOFOL N/A 03/20/2019   Procedure: COLONOSCOPY WITH PROPOFOL;  Surgeon: Jerene Bears, MD;  Location: [facility];  Service: Gastroenterology;  Laterality: N/A;  . DECOMPRESSIVE LUMBAR LAMINECTOMY LEVEL 1 N/A 11/01/2016   Procedure: DECOMPRESSIVE POSTERIOR LUMBAR LAMINECTOMY LEVEL 4-5;  Surgeon: Melina Schools, MD;  Location: [facility];  Service: Orthopedics;  Laterality: N/A;  . ESOPHAGOGASTRODUODENOSCOPY  [id number]   severe ulcertive esophagitis, retching related gastropathy, small HH.  no varices or portal hypertensive gastropathy.  Dr Lucienne Capers at [facility] in [place]  . ESOPHAGOGASTRODUODENOSCOPY  05/2014   Dr Rolan Lipa.  esophageal erythema (no Barretts on path), small HH, gastritis, duodenitis.    Marland Kitchen ESOPHAGOGASTRODUODENOSCOPY (EGD) WITH PROPOFOL N/A 03/20/2019   Procedure: ESOPHAGOGASTRODUODENOSCOPY (EGD) WITH PROPOFOL;  Surgeon: Jerene Bears, MD;  Location: [facility];  Service: Gastroenterology;  Laterality: N/A;  . HERNIA REPAIR Left    INGUNIAL  . I&D EXTREMITY Right 11/01/2016   Procedure: IRRIGATION AND DEBRIDEMENT ABSCESS RIGHT THIGH;  Surgeon: Melina Schools, MD;  Location: [facility];  Service: Orthopedics;  Laterality: Right;  . INCISION AND DRAINAGE OF WOUND N/A 01/06/2017   Procedure: IRRIGATION AND DEBRIDEMENT and placement of wound vac;  Surgeon: Melina Schools, MD;  Location: [facility];  Service: Orthopedics;  Laterality: N/A;  . INCISION AND DRAINAGE OF WOUND N/A 12/02/2016   Procedure: WASHOUT AND CLOSURE OF WOUND;  Surgeon: Melina Schools, MD;  Location: [facility];  Service: Orthopedics;  Laterality: N/A;  . LUMBAR WOUND DEBRIDEMENT N/A 11/12/2016   Procedure: LUMBAR WOUND DEBRIDEMENT;   Surgeon: Melina Schools, MD;  Location: [facility];  Service: Orthopedics;  Laterality: N/A;        Home Medications    Prior to Admission medications   Medication Sig Start Date End Date Taking? Authorizing Provider  feeding supplement, ENSURE ENLIVE, (ENSURE ENLIVE) LIQD Take 237 mLs by mouth 2 (two) times daily between meals. 01/17/19   Rory Percy, DO  folic acid (FOLVITE) 1 MG tablet Take 1 tablet (1 mg total) by mouth daily. 02/14/19   [name], Lajuan Lines, MD  Hydrocortisone (GERHARDT'S BUTT CREAM) CREA Apply 1 application topically 3 (three) times daily. 02/14/19   Mullis, Kiersten P, DO  lactulose (CHRONULAC) 10 GM/15ML solution Take 15 mLs (10 g total) by mouth 2 (two) times daily. Patient taking differently: Take 30 g by mouth 2 (two) times daily.  01/26/19   [name], Lajuan Lines, MD  liver oil-zinc oxide (DESITIN) 40 % ointment Apply topically 2 (two) times daily as needed for irritation. 02/14/19   Mullis, Kiersten P, DO  magnesium oxide (MAG-OX) 400 MG tablet Take 1 tablet (400 mg total) by mouth daily. 02/14/19   Mullis, Kiersten P, DO  Multiple Vitamin (MULTIVITAMIN WITH MINERALS) TABS tablet Take 1 tablet  by mouth daily. 11/17/16   Elgergawy, Silver Huguenin, MD  ondansetron (ZOFRAN ODT) 4 MG disintegrating tablet Take 1 tablet (4 mg total) by mouth every 8 (eight) hours as needed for nausea or vomiting. 08/19/19   [place] Yarbro A, PA-C  pantoprazole (PROTONIX) 40 MG tablet TAKE 1 TABLET BY MOUTH ONCE DAILY IN THE MORNING 06/26/19   [name], Lajuan Lines, MD  potassium chloride (K-DUR) 10 MEQ tablet Take 4 tablets (40 mEq total) by mouth daily. 02/19/19   Mullis, Kiersten P, DO  rifaximin (XIFAXAN) 550 MG TABS tablet Take 550 mg by mouth 2 (two) times daily.    [provider]  spironolactone (ALDACTONE) 50 MG tablet Take 1 tablet by mouth once daily 06/09/19   [name], Lajuan Lines, MD  tetrahydrozoline-zinc (VISINE-AC) 0.05-0.25 % ophthalmic solution Place 2 drops into both eyes daily as needed (itchy eyes).     [provider]  torsemide (DEMADEX) 20 MG tablet Take 1 tablet (20 mg total) by mouth daily. 02/15/19   Mullis, Archie Endo, DO    Family History Family History  Problem Relation Age of Onset  . Diabetes Mother   . Colon cancer Neg Hx   . Esophageal cancer Neg Hx   . Stomach cancer Neg Hx   . Pancreatic cancer Neg Hx   . Liver disease Neg Hx     Social History Social History   Tobacco Use  . Smoking status: Current Some Day Smoker    Last attempt to quit: 01/04/2017    Years since quitting: 2.6  . Smokeless tobacco: Never Used  . Tobacco comment: maybe once a week; occasional  Substance Use Topics  . Alcohol use: Yes    Comment: no alcohol at all as of 01/04/19  . Drug use: No    Comment: as a teen a few times     Allergies   Acetaminophen   Review of Systems Review of Systems  Constitutional: Positive for appetite change. Negative for activity change, chills and fever.  HENT: Positive for congestion, postnasal drip and sore throat. Negative for ear discharge, ear pain, facial swelling, hearing loss, mouth sores, rhinorrhea, sinus pressure, sinus pain, sneezing and trouble swallowing.   Eyes: Negative.   Respiratory: Positive for cough. Negative for choking, chest tightness, shortness of breath, wheezing and stridor.   Cardiovascular: Negative.   Gastrointestinal: Positive for abdominal pain (Cramping to upper abdomen), diarrhea, nausea and vomiting. Negative for abdominal distention, anal bleeding, blood in stool, constipation and rectal pain.  Genitourinary: Negative.   Musculoskeletal: Negative.   Skin: Negative.   Neurological: Negative.   All other systems reviewed and are negative.    Physical Exam Updated Vital Signs BP (!) 148/82   Pulse 90   Temp 98.8 F (37.1 C) (Oral)   Resp 17   Ht 5\' 11"  (1.803 m)   Wt 117.9 kg   SpO2 95%   BMI 36.26 kg/m   Physical Exam Vitals signs and nursing note reviewed.  Constitutional:      General: He is  not in acute distress.    Appearance: He is not ill-appearing, toxic-appearing or diaphoretic.  HENT:     Head: Normocephalic and atraumatic.     Jaw: There is normal jaw occlusion.     Right Ear: Tympanic membrane, ear canal and external ear normal. There is no impacted cerumen. No hemotympanum. Tympanic membrane is not injected, scarred, perforated, erythematous, retracted or bulging.     Left Ear: Tympanic membrane, ear canal and external  ear normal. There is no impacted cerumen. No hemotympanum. Tympanic membrane is not injected, scarred, perforated, erythematous, retracted or bulging.     Ears:     Comments: No Mastoid tenderness.    Nose:     Comments: Clear rhinorrhea and congestion to bilateral nares.  No sinus tenderness.    Mouth/Throat:     Lips: Pink.     Mouth: No oral lesions or angioedema.     Dentition: Abnormal dentition.     Pharynx: Oropharynx is clear. Uvula midline.     Tonsils: No tonsillar exudate or tonsillar abscesses.     Comments: Posterior oropharynx erythematous.  Mucous membranes moist.  Tonsils without erythema or exudate.  Uvula midline without deviation.  No evidence of PTA or RPA.  No drooling, dysphasia or trismus.  Phonation normal. Neck:     Musculoskeletal: Full passive range of motion without pain and normal range of motion.     Trachea: Trachea and phonation normal.     Meningeal: Brudzinski's sign and Kernig's sign absent.     Comments: No Neck stiffness or neck rigidity.  No meningismus.  No cervical lymphadenopathy. Cardiovascular:     Rate and Rhythm: Normal rate.     Pulses: Normal pulses.     Heart sounds: Normal heart sounds. No murmur.     Comments: No murmurs rubs or gallops. Pulmonary:     Effort: Pulmonary effort is normal.     Breath sounds: Normal breath sounds and air entry.     Comments: Clear to auscultation bilaterally without wheeze, rhonchi or rales.  No accessory muscle usage.  Able speak in full sentences. Abdominal:      Comments: Soft, nontender without rebound or guarding.  No CVA tenderness.  Musculoskeletal:     Comments: Moves all 4 extremities without difficulty.  Lower extremities without edema, erythema or warmth.  Skin:    Capillary Refill: Capillary refill takes less than 2 seconds.     Comments: Brisk capillary refill.  No rashes or lesions. No fluctuance, induration. No pitting edema to lower extremities. No janeway lesion or osler nodes.  Neurological:     Mental Status: He is alert.     Comments: Ambulatory in department without difficulty.  Cranial nerves II through XII grossly intact.  No facial droop.  No aphasia.     ED Treatments / Results  Labs (all labs ordered are listed, but only abnormal results are displayed) Labs Reviewed  CBC WITH DIFFERENTIAL/PLATELET - Abnormal; Notable for the following components:      Result Value   WBC 3.7 (*)    RBC 3.92 (*)    MCV 104.1 (*)    MCH 34.4 (*)    Platelets 27 (*)    All other components within normal limits  BASIC METABOLIC PANEL - Abnormal; Notable for the following components:   Potassium 2.9 (*)    Glucose, Bld 141 (*)    All other components within normal limits  URINALYSIS, ROUTINE W REFLEX MICROSCOPIC - Abnormal; Notable for the following components:   Color, Urine AMBER (*)    Hgb urine dipstick SMALL (*)    Bilirubin Urine SMALL (*)    Protein, ur 30 (*)    All other components within normal limits  GROUP A STREP BY PCR  CULTURE, BLOOD (ROUTINE X 2)  CULTURE, BLOOD (ROUTINE X 2)  SARS CORONAVIRUS 2 (TAT 6-24 HRS)  LIPASE, BLOOD  ETHANOL  CK  RAPID URINE DRUG SCREEN, HOSP PERFORMED  POC SARS  CORONAVIRUS 2 AG -  ED   EKG None  Radiology Dg Chest Portable 1 View  Result Date: 08/19/2019 CLINICAL DATA:  Body aches and sore throat. EXAM: PORTABLE CHEST 1 VIEW COMPARISON:  11/03/2016 FINDINGS: 1001 hours. The lungs are clear without focal pneumonia, edema, pneumothorax or pleural effusion. Minimal atelectasis or  scarring left base. Cardiopericardial silhouette is at upper limits of normal for size. The visualized bony structures of the thorax are intact. IMPRESSION: No active disease. Electronically Signed   By: Misty Stanley M.D.   On: 08/19/2019 10:23    Procedures Procedures (including critical care time)  Medications Ordered in ED Medications  potassium chloride 10 mEq in 100 mL IVPB (10 mEq Intravenous New Bag/Given 08/19/19 1146)  sodium chloride 0.9 % bolus 1,000 mL (1,000 mLs Intravenous New Bag/Given 08/19/19 0834)  ondansetron (ZOFRAN) injection 4 mg (4 mg Intravenous Given 08/19/19 0836)  morphine 4 MG/ML injection 4 mg (4 mg Intravenous Given 08/19/19 0836)  promethazine (PHENERGAN) injection 12.5 mg (12.5 mg Intravenous Given 08/19/19 1056)   Initial Impression / Assessment and Plan / ED Course  I have reviewed the triage vital signs and the nursing notes.  Pertinent labs & imaging results that were available during my care of the patient were reviewed by me and considered in my medical decision making (see chart for details).  38 year old male appears otherwise well presents for evaluation multiple complaints.  He is afebrile, nonseptic, not ill-appearing.  Unfortunately patient has extensive past medical history including polysubstance abuse and complications from this.  He did recently have an EGD 5 months ago which did not show esophageal varices.  He has continued to use alcohol however he states "intermittently."  He has had some nonbilious nonbloody emesis as well as diarrhea without melena or hematochezia.  He is also having body aches and pains, productive cough, congestion, rhinorrhea.  His heart and lungs are clear.  He has no evidence of infectious process on exam.  He does not appear fluid overloaded.  He has no JVD and Bevelyn Buckles' sign is negative bilaterally he does not appear actively in alcohol withdrawal as he denies any tachycardia and is without confusion.  He does have some  mild hypertension however patient states he has not been taking his blood pressure medicines.  His abdomen is soft, nontender.  He has no focal tenderness.  He denies any chest pain, shortness of breath or hemoptysis.  Nonfocal neurologic exam without deficits.  He has neuromusculoskeletal exam.  High suspicion for viral illness however given history will obtain some labs to rule out acute bacterial infectious process which would require antibiotics or inpatient management.  No murmur to suggest bacterial endocarditis.  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 No bleeding on exam. Stable Hgb. BMP hypokalemia at 2.9, given emesis will give IV potassium Lipase 23 DG Chest without infiltrates cardiomegaly, pneumothorax, pulmonary edema CK 139 EKG without ST/ T changes. QT 489 Urinalysis negative for infection Strep negative Rapid COVID Negative Ethanol <10  1100: Patient with mild nausea however no emesis since given Zofran. Will given additional Phenergan  1210: Patient reevaluated and states his symptoms have significantly improved.  He has been able to tolerate 3 x apple juice and crackers without emesis in ED. CIWA 2- emesis. No tremors and no ataxic gait.  He is ambulatory in room without difficulty.  Well do outpatient Covid test given  patient symptoms have significantly resolved the emergency department.  We did obtain blood cultures as patient is high risk given his history of IV drug use however do not feel he needs inpatient management at this time.   Patient does not meet the SIRS or Sepsis criteria.  On repeat exam patient does not have a surgical abdomin and there are no peritoneal signs.  No indication of appendicitis, bowel obstruction, bowel perforation, cholecystitis, diverticulitis.  Discussed with patient low platelets which she has history of.  Discussed follow-up  with [facility] who has been following patient outpatient.  Also give resources for outpatient substance abuse.  Discussed strict return precautions with patient.  Patient voiced understanding and is agreeable follow-up.    Discussed with attending physician, Dr. Billy Fischer who agrees with above treatment, plan and disposition.      Final Clinical Impressions(s) / ED Diagnoses   Final diagnoses:  Viral upper respiratory tract infection  Close exposure to COVID-19 virus  Thrombocytopenia (HCC)  Hypokalemia  Nausea vomiting and diarrhea    ED Discharge Orders         Ordered    ondansetron (ZOFRAN ODT) 4 MG disintegrating tablet  Every 8 hours PRN     08/19/19 1220           Solimar Maiden A, PA-C 08/19/19 1228    Gareth Morgan, MD 08/20/19 [phone]

## 2019-08-19 NOTE — ED Triage Notes (Signed)
Pt. Stated, I ve body aches, sore throat , and cough since Wednesday.

## 2019-08-19 NOTE — ED Notes (Signed)
Given Apply  juice

## 2019-08-19 NOTE — ED Notes (Signed)
Platelet 27,it was 31 on May 31,2020

## 2019-08-19 NOTE — Discharge Instructions (Signed)
Take the Zofran as needed for nausea and vomiting.  You will be called with your Covid test.  If positive you may be just stay out of work for 10 days.  Your platelet levels were low.  Need to follow-up with your primary care physician for this or your GI doctor whoever follows you chronically for this.  I have given you some resources outpatient.   Return to the emergency department for any new worsening symptoms.  We did collect blood cultures.  If positive you will be called need to return to the emergency department for treatment

## 2019-08-19 NOTE — ED Notes (Signed)
Patient verbalizes understanding of discharge instructions. Opportunity for questioning and answers were provided. Armband removed by staff, pt discharged from ED ambulatory.   

## 2019-08-21 ENCOUNTER — Telehealth: Payer: Self-pay | Admitting: Internal Medicine

## 2019-08-21 NOTE — Telephone Encounter (Signed)
Pt's brother [name] called, he is concerned because his brother tested positive for Covid. He stated that doctors seemed to conflict with what his brother can take for sxs given his liver problem. He stated that he respects Dr. Vena Rua opinion so he would give his brother what Dr. Hilarie Fredrickson advises. Pls call him.

## 2019-08-22 ENCOUNTER — Other Ambulatory Visit: Payer: Self-pay | Admitting: Internal Medicine

## 2019-08-22 NOTE — Telephone Encounter (Signed)
Pts brother states pt was diagnosed with covid yesterday, he has an extremely sore throat and body aches. Brother wants to know what he can take for the body aches. He thought it was ok for him to take tylenol but the pharmacist stated he shouldn't take tylenol. He didn't think he could take ibuprofen. Please advise.

## 2019-08-22 NOTE — Telephone Encounter (Signed)
Ok for APAP up to 2 grams daily in divided doses Would avoid NSAIDs No ETOH is also very very important

## 2019-08-22 NOTE — Telephone Encounter (Signed)
Spoke with pts brother and he is aware.

## 2019-08-24 LAB — CULTURE, BLOOD (ROUTINE X 2)
Culture: NO GROWTH
Culture: NO GROWTH
Special Requests: ADEQUATE
Special Requests: ADEQUATE

## 2019-11-22 ENCOUNTER — Emergency Department (HOSPITAL_COMMUNITY): Payer: Self-pay

## 2019-11-22 ENCOUNTER — Emergency Department (HOSPITAL_COMMUNITY)
Admission: EM | Admit: 2019-11-22 | Discharge: 2019-11-22 | Disposition: A | Discharge status: Home / Self Care | Payer: Self-pay | Attending: Emergency Medicine | Admitting: Emergency Medicine

## 2019-11-22 ENCOUNTER — Encounter (HOSPITAL_COMMUNITY): Payer: Self-pay | Admitting: Emergency Medicine

## 2019-11-22 ENCOUNTER — Other Ambulatory Visit: Payer: Self-pay

## 2019-11-22 DIAGNOSIS — F10930 Alcohol use, unspecified with withdrawal, uncomplicated: Secondary | ICD-10-CM

## 2019-11-22 DIAGNOSIS — R112 Nausea with vomiting, unspecified: Secondary | ICD-10-CM | POA: Insufficient documentation

## 2019-11-22 DIAGNOSIS — Z79899 Other long term (current) drug therapy: Secondary | ICD-10-CM | POA: Insufficient documentation

## 2019-11-22 DIAGNOSIS — F17210 Nicotine dependence, cigarettes, uncomplicated: Secondary | ICD-10-CM | POA: Insufficient documentation

## 2019-11-22 DIAGNOSIS — F10230 Alcohol dependence with withdrawal, uncomplicated: Secondary | ICD-10-CM | POA: Insufficient documentation

## 2019-11-22 DIAGNOSIS — K7030 Alcoholic cirrhosis of liver without ascites: Secondary | ICD-10-CM | POA: Insufficient documentation

## 2019-11-22 LAB — CBC WITH DIFFERENTIAL/PLATELET
Abs Immature Granulocytes: 0.03 10*3/uL (ref 0.00–0.07)
Basophils Absolute: 0.1 10*3/uL (ref 0.0–0.1)
Basophils Relative: 2 %
Eosinophils Absolute: 0 10*3/uL (ref 0.0–0.5)
Eosinophils Relative: 0 %
HCT: 40.9 % (ref 39.0–52.0)
Hemoglobin: 13.5 g/dL (ref 13.0–17.0)
Immature Granulocytes: 0 %
Lymphocytes Relative: 19 %
Lymphs Abs: 1.5 10*3/uL (ref 0.7–4.0)
MCH: 34.9 pg — ABNORMAL HIGH (ref 26.0–34.0)
MCHC: 33 g/dL (ref 30.0–36.0)
MCV: 105.7 fL — ABNORMAL HIGH (ref 80.0–100.0)
Monocytes Absolute: 0.8 10*3/uL (ref 0.1–1.0)
Monocytes Relative: 9 %
Neutro Abs: 5.8 10*3/uL (ref 1.7–7.7)
Neutrophils Relative %: 70 %
Platelets: 43 10*3/uL — ABNORMAL LOW (ref 150–400)
RBC: 3.87 MIL/uL — ABNORMAL LOW (ref 4.22–5.81)
RDW: 14.4 % (ref 11.5–15.5)
WBC: 8.3 10*3/uL (ref 4.0–10.5)
nRBC: 0 % (ref 0.0–0.2)

## 2019-11-22 LAB — COMPREHENSIVE METABOLIC PANEL
ALT: 46 U/L — ABNORMAL HIGH (ref 0–44)
AST: 116 U/L — ABNORMAL HIGH (ref 15–41)
Albumin: 3 g/dL — ABNORMAL LOW (ref 3.5–5.0)
Alkaline Phosphatase: 126 U/L (ref 38–126)
Anion gap: 14 (ref 5–15)
BUN: 8 mg/dL (ref 6–20)
CO2: 31 mmol/L (ref 22–32)
Calcium: 9.8 mg/dL (ref 8.9–10.3)
Chloride: 99 mmol/L (ref 98–111)
Creatinine, Ser: 0.9 mg/dL (ref 0.61–1.24)
GFR calc Af Amer: >60 mL/min (ref >60)
GFR calc non Af Amer: >60 mL/min (ref >60)
Glucose, Bld: 140 mg/dL — ABNORMAL HIGH (ref 70–99)
Potassium: 3.3 mmol/L — ABNORMAL LOW (ref 3.5–5.1)
Sodium: 144 mmol/L (ref 135–145)
Total Bilirubin: 10 mg/dL — ABNORMAL HIGH (ref 0.3–1.2)
Total Protein: 8 g/dL (ref 6.5–8.1)

## 2019-11-22 LAB — URINALYSIS, ROUTINE W REFLEX MICROSCOPIC
Bilirubin Urine: NEGATIVE
Glucose, UA: NEGATIVE mg/dL
Hgb urine dipstick: NEGATIVE
Ketones, ur: NEGATIVE mg/dL
Leukocytes,Ua: NEGATIVE
Nitrite: NEGATIVE
Protein, ur: NEGATIVE mg/dL
Specific Gravity, Urine: 1.015 (ref 1.005–1.030)
pH: 8 (ref 5.0–8.0)

## 2019-11-22 LAB — ETHANOL: Alcohol, Ethyl (B): <10 mg/dL (ref <10)

## 2019-11-22 LAB — PROTIME-INR
INR: 1.8 — ABNORMAL HIGH (ref 0.8–1.2)
Prothrombin Time: 21.2 seconds — ABNORMAL HIGH (ref 11.4–15.2)

## 2019-11-22 MED ORDER — ONDANSETRON HCL 4 MG/2ML IJ SOLN
4.0000 mg | Freq: Once | INTRAMUSCULAR | Status: AC
  Administered 2019-11-22: 4 mg via INTRAVENOUS
  Filled 2019-11-22: qty 2

## 2019-11-22 MED ORDER — ONDANSETRON 4 MG PO TBDP: 4.0000 mg | ORAL_TABLET | Freq: Three times a day (TID) | ORAL | 0 refills | Status: AC | PRN

## 2019-11-22 MED ORDER — CHLORDIAZEPOXIDE HCL 25 MG PO CAPS: ORAL_CAPSULE | ORAL | 0 refills | Status: AC

## 2019-11-22 MED ORDER — SODIUM CHLORIDE 0.9 % IV BOLUS
500.0000 mL | Freq: Once | INTRAVENOUS | Status: AC
  Administered 2019-11-22: 09:00:00 500 mL via INTRAVENOUS

## 2019-11-22 MED ORDER — LORAZEPAM 2 MG/ML IJ SOLN
1.0000 mg | Freq: Once | INTRAMUSCULAR | Status: AC
  Administered 2019-11-22: 1 mg via INTRAVENOUS
  Filled 2019-11-22: qty 1

## 2019-11-22 MED ORDER — LORAZEPAM 2 MG/ML IJ SOLN
1.0000 mg | Freq: Once | INTRAMUSCULAR | Status: AC
  Administered 2019-11-22: 13:00:00 1 mg via INTRAVENOUS
  Filled 2019-11-22: qty 1

## 2019-11-22 NOTE — Discharge Instructions (Signed)
Continue to not drink.  Your liver looks worse.  Follow-up with Dr. Hilarie Fredrickson.

## 2019-11-22 NOTE — ED Triage Notes (Addendum)
Pt arrives POV with complaints of body aches and emesis X1 days. Pt states he had Covid in Oct 2020.  Marland Kitchen Pain denies taking any meds d/t vomiting. Pt endorses body aches 8/10 with right leg being more severe with 10/10

## 2019-11-22 NOTE — ED Provider Notes (Signed)
[facility] EMERGENCY DEPARTMENT Provider Note   CSN: [id number] Arrival date & time: 11/22/19  [id number]     History Chief Complaint  Patient presents with  . Emesis    [name] is a 39 y.o. male.  HPI Patient presents with nausea and vomiting and some chills.  Began yesterday.  States started with coughing and then vomited.  Since then has had nausea and dry heaves and a small amount of diarrhea.  No real abdominal pain.  States he does ache all over.  Has a history of alcoholic cirrhosis.  States he had been doing pretty well but over the holidays began drinking again.  States he has not had a drink in the last couple days.  No fever.  No sick contacts.  He has scleral icterus and states it is normally not like this.  States his eyes have been bloodshot.  No blood in the stool.  Had a fall few days ago.  Landed on right hip.  Has chronic pain there.  Does have some bruising.    Past Medical History:  Diagnosis Date  . Alcoholic cirrhosis ([place]) [id number]   confirmed by liver biopsy.    . Cellulitis 10/2016   right leg  . 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 ETOH abuse   . Hepatitis C 2015   Hepatitis C. Rx Solvaldi/Ribaviran in 2015, undetectable Hep C 03/2015 (see Care Everywhere) but did not have sustained response. Began 12 weeks Mayvret 10/2017 by [facility] in [place], [place] PA-C  . Hepatitis, alcoholic Q000111Q   treated with Prednisolone.    . Pancreatitis, alcoholic, acute Q000111Q  . Paraspinal abscess ([place])   . Thrombocytopenia ([place]) 25/2018    Patient Active Problem List   Diagnosis Date Noted  . Portal hypertension ([place])   . Rectal bleeding   . SBP (spontaneous bacterial peritonitis) ([place])   . UTI (urinary tract infection) 02/09/2019  . Thrombocytosis ([place])   . E-coli UTI   . E coli bacteremia   . Urinary tract infection without hematuria   . Hepatic encephalopathy ([place]) 01/19/2019  . Weakness  01/18/2019  . AKI (acute kidney injury) ([place])   . Alcohol withdrawal ([place]) 01/12/2019  . Alcoholic hepatitis Q000111Q  . Hyperbilirubinemia   . Acute on chronic alcoholic liver disease ([place])   . History of portal hypertension 04/12/2018  . Medication monitoring encounter 01/19/2017  . Weakness of right lower extremity 11/19/2016  . 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 Abnormality of gait   . Post-operative pain   . Infective myositis of right thigh   . Coagulopathy ([place])   . Alcohol withdrawal syndrome with complication ([place])   . Transaminitis   . Infection 11/12/2016  . Alcoholic cirrhosis of liver without ascites ([place])   . Goals of care, counseling/discussion   . Palliative care by specialist   . Pyogenic arthritis of multiple sites ([place])   . Traumatic compartment syndrome of other sites   . 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 Traumatic compartment syndrome of lower extremity ([place])   . Infection of lumbar spine ([place]) 11/01/2016  . Delirium tremens ([place])   . Abnormal transaminases   . Alcoholic hepatitis without ascites   . ETOH abuse   . Abscess of right lower extremity   . Cellulitis and abscess of right lower extremity   . Hypernatremia   . Cellulitis of right lower extremity 10/30/2016  . EtOH dependence ([place]) 10/30/2016  . Thrombocytopenia ([place]) 10/30/2016  .  Cirrhosis ([place])   . Paraspinal abscess ([place])   . Chronic hepatitis C without hepatic coma ([place])   . Hepatitis   . Epidural abscess 10/29/2016  . Liver failure ([place]) 10/29/2016  . Sepsis ([place]) 10/29/2016  . Hyponatremia 10/29/2016    Past Surgical History:  Procedure Laterality Date  . COLONOSCOPY WITH PROPOFOL N/A 03/20/2019   Procedure: COLONOSCOPY WITH PROPOFOL;  Surgeon: Jerene Bears, MD;  Location: [facility];  Service: Gastroenterology;  Laterality: N/A;  . DECOMPRESSIVE LUMBAR LAMINECTOMY LEVEL 1 N/A 11/01/2016   Procedure: DECOMPRESSIVE POSTERIOR LUMBAR LAMINECTOMY LEVEL 4-5;  Surgeon: Melina Schools, MD;  Location: [place];  Service: Orthopedics;  Laterality: N/A;  . ESOPHAGOGASTRODUODENOSCOPY  [id number]   severe ulcertive esophagitis, retching related gastropathy, small HH.  no varices or portal hypertensive gastropathy.  Dr Lucienne Capers at [facility] in [place]  . ESOPHAGOGASTRODUODENOSCOPY  05/2014   Dr Rolan Lipa.  esophageal erythema (no Barretts on path), small HH, gastritis, duodenitis.    Marland Kitchen ESOPHAGOGASTRODUODENOSCOPY (EGD) WITH PROPOFOL N/A 03/20/2019   Procedure: ESOPHAGOGASTRODUODENOSCOPY (EGD) WITH PROPOFOL;  Surgeon: Jerene Bears, MD;  Location: [facility];  Service: Gastroenterology;  Laterality: N/A;  . HERNIA REPAIR Left    INGUNIAL  . I & D EXTREMITY Right 11/01/2016   Procedure: IRRIGATION AND DEBRIDEMENT ABSCESS RIGHT THIGH;  Surgeon: Melina Schools, MD;  Location: [place];  Service: Orthopedics;  Laterality: Right;  . INCISION AND DRAINAGE OF WOUND N/A 01/06/2017   Procedure: IRRIGATION AND DEBRIDEMENT and placement of wound vac;  Surgeon: Melina Schools, MD;  Location: [place];  Service: Orthopedics;  Laterality: N/A;  . INCISION AND DRAINAGE OF WOUND N/A 12/02/2016   Procedure: WASHOUT AND CLOSURE OF WOUND;  Surgeon: Melina Schools, MD;  Location: [place];  Service: Orthopedics;  Laterality: N/A;  . LUMBAR WOUND DEBRIDEMENT N/A 11/12/2016   Procedure: LUMBAR WOUND DEBRIDEMENT;  Surgeon: Melina Schools, MD;  Location: [place] [facility];  Service: Orthopedics;  Laterality: N/A;       Family History  Problem Relation Age of Onset  . Diabetes Mother   . Colon cancer Neg Hx   . Esophageal cancer Neg Hx   . Stomach cancer Neg Hx   . Pancreatic cancer Neg Hx   . Liver disease Neg Hx     Social History   Tobacco Use  . Smoking status: Current Some Day Smoker    Last attempt to quit: 01/04/2017    Years since quitting: 2.8  . Smokeless tobacco: Never Used  . Tobacco comment: maybe once a week; occasional  Substance Use Topics  . Alcohol use: Yes     Comment: no alcohol at all as of 01/04/19  . Drug use: No    Comment: as a teen a few times    Home Medications Prior to Admission medications   Medication Sig Start Date End Date Taking? Authorizing Provider  feeding supplement, ENSURE ENLIVE, (ENSURE ENLIVE) LIQD Take 237 mLs by mouth 2 (two) times daily between meals. 01/17/19  Yes Rory Percy, DO  folic acid (FOLVITE) 1 MG tablet Take 1 tablet by mouth once daily 08/22/19  Yes [name], Lajuan Lines, MD  lactulose (CHRONULAC) 10 GM/15ML solution Take 15 mLs (10 g total) by mouth 2 (two) times daily. Patient taking differently: Take 20 g by mouth 2 (two) times daily as needed for mild constipation.  01/26/19  Yes [name], Lajuan Lines, MD  Multiple Vitamin (MULTIVITAMIN WITH MINERALS) TABS tablet Take 1 tablet by mouth daily. 11/17/16  Yes Elgergawy, Silver Huguenin, MD  rifaximin (XIFAXAN) 550 MG TABS tablet Take 550 mg by mouth 2 (two) times daily.   Yes [provider]  chlordiazePOXIDE (LIBRIUM) 25 MG capsule 50mg  PO TID x 1D, then 25-50mg  PO BID X 1D, then 25-50mg  PO QD X 1D 11/22/19   Davonna Belling, MD  ondansetron (ZOFRAN-ODT) 4 MG disintegrating tablet Take 1 tablet (4 mg total) by mouth every 8 (eight) hours as needed for nausea or vomiting. 11/22/19   Davonna Belling, MD    Allergies    Acetaminophen  Review of Systems   Review of Systems  Constitutional: Positive for appetite change.  HENT: Negative for congestion.   Respiratory: Positive for cough.   Cardiovascular: Positive for leg swelling.  Gastrointestinal: Positive for diarrhea, nausea and vomiting. Negative for abdominal pain.  Genitourinary: Negative for flank pain.  Musculoskeletal: Negative for back pain.  Skin: Negative for rash.  Neurological: Negative for weakness.  Psychiatric/Behavioral: Negative for confusion.    Physical Exam Updated Vital Signs BP 120/69   Pulse (!) 128   Temp (S) 99.4 F (37.4 C) (Rectal)   Resp (!) 34   Ht 5\' 11"  (1.803 m)   Wt 117.9  kg   SpO2 97%   BMI 36.26 kg/m   Physical Exam Vitals reviewed.  HENT:     Head: Normocephalic.  Eyes:     General: Scleral icterus present.  Cardiovascular:     Rate and Rhythm: Tachycardia present.     Comments: Mild tachycardia Pulmonary:     Breath sounds: No wheezing or rhonchi.  Abdominal:     Tenderness: There is no abdominal tenderness.  Musculoskeletal:     Comments: Some pitting edema bilateral lower extremities.  Skin:    General: Skin is warm.  Neurological:     Mental Status: He is alert. Mental status is at baseline.     ED Results / Procedures / Treatments   Labs (all labs ordered are listed, but only abnormal results are displayed) Labs Reviewed  COMPREHENSIVE METABOLIC PANEL - Abnormal; Notable for the following components:      Result Value   Potassium 3.3 (*)    Glucose, Bld 140 (*)    Albumin 3.0 (*)    AST 116 (*)    ALT 46 (*)    Total Bilirubin 10.0 (*)    All other components within normal limits  PROTIME-INR - Abnormal; Notable for the following components:   Prothrombin Time 21.2 (*)    INR 1.8 (*)    All other components within normal limits  CBC WITH DIFFERENTIAL/PLATELET - Abnormal; Notable for the following components:   RBC 3.87 (*)    MCV 105.7 (*)    MCH 34.9 (*)    Platelets 43 (*)    All other components within normal limits  URINALYSIS, ROUTINE W REFLEX MICROSCOPIC - Abnormal; Notable for the following components:   Color, Urine AMBER (*)    All other components within normal limits  ETHANOL    EKG EKG Interpretation  Date/Time:  Wednesday November 22 2019 08:41:06 EST Ventricular Rate:  113 PR Interval:    QRS Duration: 103 QT Interval:  340 QTC Calculation: 467 R Axis:   -55 Text Interpretation: Sinus tachycardia LAD, consider left anterior fascicular block Probable anteroseptal infarct, old Confirmed by Davonna Belling [phone]) on 11/22/2019 8:54:40 AM   Radiology DG Chest Portable 1 View  Result Date:  11/22/2019 CLINICAL DATA:  What is EXAM: PORTABLE CHEST 1 VIEW COMPARISON:  August 18, 2019 FINDINGS: The lungs are clear. Heart is mildly enlarged with pulmonary vascularity normal. No adenopathy. No pneumothorax. No bone lesions. IMPRESSION: Stable cardiac prominence.  Lungs clear. Electronically Signed   By: Lowella Grip III M.D.   On: 11/22/2019 09:39    Procedures Procedures (including critical care time)  Medications Ordered in ED Medications  ondansetron (ZOFRAN) injection 4 mg (4 mg Intravenous Given 11/22/19 0906)  sodium chloride 0.9 % bolus 500 mL (0 mLs Intravenous Stopped 11/22/19 0936)  LORazepam (ATIVAN) injection 1 mg (1 mg Intravenous Given 11/22/19 0935)  LORazepam (ATIVAN) injection 1 mg (1 mg Intravenous Given 11/22/19 1253)    ED Course  I have reviewed the triage vital signs and the nursing notes.  Pertinent labs & imaging results that were available during my care of the patient were reviewed by me and considered in my medical decision making (see chart for details).    MDM Rules/Calculators/A&P                      Patient with nausea and vomiting.  Somewhat worsening liver function but known cirrhosis.  Has been drinking again.  Has been drinking less he says that over the last couple days not had a drink.  Feels much better after Ativan however.  Heart rate is normalized as has his other vitals.  Has tolerated orals.  Will discharge with Librium taper and with antiemetics.  Feels that he can tolerate his home.  Will follow with Dr. Hilarie Fredrickson as well. Final Clinical Impression(s) / ED Diagnoses Final diagnoses:  Non-intractable vomiting with nausea, unspecified vomiting type  Alcohol withdrawal syndrome without complication (HCC)  Alcoholic cirrhosis of liver without ascites ([place])    Rx / DC Orders ED Discharge Orders         Ordered    ondansetron (ZOFRAN-ODT) 4 MG disintegrating tablet  Every 8 hours PRN     11/22/19 1420    chlordiazePOXIDE (LIBRIUM) 25  MG capsule     11/22/19 1420           Davonna Belling, MD 11/22/19 1438

## 2019-11-22 NOTE — ED Notes (Signed)
Pt has large bruise to right hip area- states it is from falling on the ice, purplish in color

## 2019-11-22 NOTE — ED Notes (Signed)
Portable Xray at bedside.

## 2019-11-27 ENCOUNTER — Encounter: Payer: Self-pay | Admitting: Internal Medicine

## 2019-11-27 NOTE — Progress Notes (Signed)
Message Received: 4 days ago Message Contents  [name], Jose Lines, [name], CMA  Pt in ER with ongoing ETOH use, unfortunate  Needs followup       Previous Messages  Unfortunately, as before, patient is uninsured other than with Medicaid "family planning" which we do not take. Patient says his "insurance is messed up" but that he would call back later. He was originally set for appointment 11/28/19 before calling back to cancel appointment.

## 2019-11-28 ENCOUNTER — Ambulatory Visit: Payer: Medicaid Other | Admitting: Internal Medicine

## 2019-12-05 ENCOUNTER — Other Ambulatory Visit: Payer: Self-pay | Admitting: Internal Medicine

## 2020-02-14 ENCOUNTER — Telehealth: Payer: Self-pay

## 2020-02-14 NOTE — Telephone Encounter (Signed)
Volunteer support call for palliative care, message left 

## 2020-03-08 ENCOUNTER — Telehealth: Payer: Self-pay | Admitting: Internal Medicine

## 2020-03-08 NOTE — Telephone Encounter (Signed)
Pt requested to speak to you regarding his medical records.  He stated that he is trying to apply for disability due to cirrhosis.  Pt stated that he "has spoken to [facility] a lot over the years" and insisted to speak to you.

## 2020-03-08 NOTE — Telephone Encounter (Signed)
Patient is trying to schedule an office visit to discuss disability.  He has not been seen in over a year. He is scheduled for 04/29/20 3:00

## 2020-03-28 DEATH — deceased

## 2020-04-29 ENCOUNTER — Ambulatory Visit: Payer: Medicaid Other | Admitting: Internal Medicine
